# Patient Record
Sex: Male | Born: 1950 | Race: White | Hispanic: No | Marital: Married | Smoking: Former smoker
Health system: Southern US, Community
[De-identification: ages and names within clinical notes are randomized; demographics above are authoritative.]

## PROBLEM LIST (undated history)

## (undated) DIAGNOSIS — G4733 Obstructive sleep apnea (adult) (pediatric): Secondary | ICD-10-CM

## (undated) DIAGNOSIS — K219 Gastro-esophageal reflux disease without esophagitis: Secondary | ICD-10-CM

## (undated) DIAGNOSIS — M722 Plantar fascial fibromatosis: Secondary | ICD-10-CM

## (undated) DIAGNOSIS — I10 Essential (primary) hypertension: Secondary | ICD-10-CM

## (undated) DIAGNOSIS — G4730 Sleep apnea, unspecified: Secondary | ICD-10-CM

## (undated) DIAGNOSIS — K760 Fatty (change of) liver, not elsewhere classified: Secondary | ICD-10-CM

## (undated) DIAGNOSIS — I493 Ventricular premature depolarization: Secondary | ICD-10-CM

## (undated) DIAGNOSIS — N390 Urinary tract infection, site not specified: Secondary | ICD-10-CM

## (undated) DIAGNOSIS — K8020 Calculus of gallbladder without cholecystitis without obstruction: Secondary | ICD-10-CM

## (undated) DIAGNOSIS — D509 Iron deficiency anemia, unspecified: Secondary | ICD-10-CM

## (undated) DIAGNOSIS — I2510 Atherosclerotic heart disease of native coronary artery without angina pectoris: Secondary | ICD-10-CM

## (undated) DIAGNOSIS — I219 Acute myocardial infarction, unspecified: Secondary | ICD-10-CM

## (undated) DIAGNOSIS — E119 Type 2 diabetes mellitus without complications: Secondary | ICD-10-CM

## (undated) HISTORY — DX: Atherosclerotic heart disease of native coronary artery without angina pectoris: I25.10

## (undated) HISTORY — DX: Iron deficiency anemia, unspecified: D50.9

## (undated) HISTORY — DX: Ventricular premature depolarization: I49.3

## (undated) HISTORY — DX: Gastro-esophageal reflux disease without esophagitis: K21.9

## (undated) HISTORY — DX: Type 2 diabetes mellitus without complications: E11.9

## (undated) HISTORY — DX: Essential (primary) hypertension: I10

## (undated) HISTORY — DX: Fatty (change of) liver, not elsewhere classified: K76.0

## (undated) HISTORY — DX: Obstructive sleep apnea (adult) (pediatric): G47.33

## (undated) HISTORY — DX: Calculus of gallbladder without cholecystitis without obstruction: K80.20

## (undated) HISTORY — PX: SEPTOPLASTY: SUR1290

## (undated) HISTORY — PX: CATARACT EXTRACTION: SUR2

## (undated) HISTORY — PX: TONSILLECTOMY: SUR1361

## (undated) HISTORY — DX: Urinary tract infection, site not specified: N39.0

## (undated) HISTORY — DX: Plantar fascial fibromatosis: M72.2

## (undated) HISTORY — DX: Sleep apnea, unspecified: G47.30

---

## 2000-09-17 ENCOUNTER — Encounter: Payer: Self-pay | Admitting: Emergency Medicine

## 2000-09-17 ENCOUNTER — Inpatient Hospital Stay (HOSPITAL_COMMUNITY): Admission: EM | Admit: 2000-09-17 | Discharge: 2000-09-20 | Payer: Self-pay | Admitting: Emergency Medicine

## 2000-10-14 ENCOUNTER — Encounter (HOSPITAL_COMMUNITY): Admission: RE | Admit: 2000-10-14 | Discharge: 2001-01-12 | Payer: Self-pay | Admitting: Cardiology

## 2001-01-26 ENCOUNTER — Ambulatory Visit (HOSPITAL_BASED_OUTPATIENT_CLINIC_OR_DEPARTMENT_OTHER): Admission: RE | Admit: 2001-01-26 | Discharge: 2001-01-26 | Payer: Self-pay | Admitting: Cardiology

## 2006-06-23 ENCOUNTER — Ambulatory Visit: Payer: Self-pay | Admitting: Family Medicine

## 2006-08-01 ENCOUNTER — Observation Stay (HOSPITAL_COMMUNITY): Admission: RE | Admit: 2006-08-01 | Discharge: 2006-08-01 | Payer: Self-pay | Admitting: Otolaryngology

## 2006-09-15 ENCOUNTER — Ambulatory Visit: Payer: Self-pay | Admitting: Family Medicine

## 2006-09-15 LAB — CONVERTED CEMR LAB
ALT: 24 units/L (ref 0–40)
AST: 25 units/L (ref 0–37)
BUN: 17 mg/dL (ref 6–23)
CO2: 27 meq/L (ref 19–32)
Calcium: 9.5 mg/dL (ref 8.4–10.5)
Chloride: 104 meq/L (ref 96–112)
Chol/HDL Ratio, serum: 3
Cholesterol: 124 mg/dL (ref 0–200)
Creatinine, Ser: 1.1 mg/dL (ref 0.4–1.5)
Creatinine,U: 157.2 mg/dL
GFR calc non Af Amer: 73 mL/min
Glomerular Filtration Rate, Af Am: 89 mL/min/{1.73_m2}
Glucose, Bld: 138 mg/dL — ABNORMAL HIGH (ref 70–99)
HDL: 40.7 mg/dL (ref >39.0)
Hgb A1c MFr Bld: 7.9 % — ABNORMAL HIGH (ref 4.6–6.0)
LDL Cholesterol: 62 mg/dL (ref 0–99)
Microalb Creat Ratio: 2.5 mg/g (ref 0.0–30.0)
Microalb, Ur: 0.4 mg/dL (ref 0.0–1.9)
Potassium: 5 meq/L (ref 3.5–5.1)
Sodium: 138 meq/L (ref 135–145)
Triglyceride fasting, serum: 106 mg/dL (ref 0–149)
VLDL: 21 mg/dL (ref 0–40)

## 2006-10-17 ENCOUNTER — Ambulatory Visit: Payer: Self-pay | Admitting: Family Medicine

## 2006-10-17 LAB — CONVERTED CEMR LAB
ALT: 25 units/L (ref 0–40)
AST: 27 units/L (ref 0–37)
BUN: 19 mg/dL (ref 6–23)
CO2: 29 meq/L (ref 19–32)
Calcium: 9.7 mg/dL (ref 8.4–10.5)
Chloride: 108 meq/L (ref 96–112)
Creatinine, Ser: 1 mg/dL (ref 0.4–1.5)
GFR calc non Af Amer: 82 mL/min
Glomerular Filtration Rate, Af Am: 99 mL/min/{1.73_m2}
Glucose, Bld: 132 mg/dL — ABNORMAL HIGH (ref 70–99)
Potassium: 4.8 meq/L (ref 3.5–5.1)
Sodium: 140 meq/L (ref 135–145)

## 2006-12-15 ENCOUNTER — Ambulatory Visit: Payer: Self-pay | Admitting: Family Medicine

## 2006-12-15 LAB — CONVERTED CEMR LAB
ALT: 24 units/L (ref 0–40)
AST: 30 units/L (ref 0–37)
Amylase: 39 units/L (ref 27–131)
Hgb A1c MFr Bld: 7.7 % — ABNORMAL HIGH (ref 4.6–6.0)

## 2006-12-22 ENCOUNTER — Encounter: Admission: RE | Admit: 2006-12-22 | Discharge: 2007-03-22 | Payer: Self-pay | Admitting: Family Medicine

## 2007-02-26 DIAGNOSIS — E118 Type 2 diabetes mellitus with unspecified complications: Secondary | ICD-10-CM

## 2007-02-26 DIAGNOSIS — I10 Essential (primary) hypertension: Secondary | ICD-10-CM

## 2007-02-26 DIAGNOSIS — I2510 Atherosclerotic heart disease of native coronary artery without angina pectoris: Secondary | ICD-10-CM

## 2007-02-26 DIAGNOSIS — K219 Gastro-esophageal reflux disease without esophagitis: Secondary | ICD-10-CM

## 2007-02-26 DIAGNOSIS — G4730 Sleep apnea, unspecified: Secondary | ICD-10-CM | POA: Insufficient documentation

## 2007-02-26 DIAGNOSIS — E119 Type 2 diabetes mellitus without complications: Secondary | ICD-10-CM

## 2007-02-26 HISTORY — DX: Type 2 diabetes mellitus without complications: E11.9

## 2007-04-06 ENCOUNTER — Ambulatory Visit: Payer: Self-pay | Admitting: Family Medicine

## 2007-04-07 ENCOUNTER — Encounter (INDEPENDENT_AMBULATORY_CARE_PROVIDER_SITE_OTHER): Payer: Self-pay | Admitting: Family Medicine

## 2007-04-08 ENCOUNTER — Telehealth (INDEPENDENT_AMBULATORY_CARE_PROVIDER_SITE_OTHER): Payer: Self-pay | Admitting: *Deleted

## 2007-04-17 ENCOUNTER — Telehealth (INDEPENDENT_AMBULATORY_CARE_PROVIDER_SITE_OTHER): Payer: Self-pay | Admitting: *Deleted

## 2007-04-24 ENCOUNTER — Encounter (INDEPENDENT_AMBULATORY_CARE_PROVIDER_SITE_OTHER): Payer: Self-pay | Admitting: Family Medicine

## 2007-05-07 ENCOUNTER — Encounter (INDEPENDENT_AMBULATORY_CARE_PROVIDER_SITE_OTHER): Payer: Self-pay | Admitting: Family Medicine

## 2007-05-19 ENCOUNTER — Encounter (INDEPENDENT_AMBULATORY_CARE_PROVIDER_SITE_OTHER): Payer: Self-pay | Admitting: Family Medicine

## 2007-06-12 ENCOUNTER — Telehealth (INDEPENDENT_AMBULATORY_CARE_PROVIDER_SITE_OTHER): Payer: Self-pay | Admitting: *Deleted

## 2007-06-16 ENCOUNTER — Telehealth (INDEPENDENT_AMBULATORY_CARE_PROVIDER_SITE_OTHER): Payer: Self-pay | Admitting: *Deleted

## 2007-07-20 ENCOUNTER — Ambulatory Visit: Payer: Self-pay | Admitting: Family Medicine

## 2007-07-20 DIAGNOSIS — E785 Hyperlipidemia, unspecified: Secondary | ICD-10-CM | POA: Insufficient documentation

## 2007-07-21 LAB — CONVERTED CEMR LAB
CO2: 29 meq/L (ref 19–32)
Chloride: 108 meq/L (ref 96–112)
Creatinine, Ser: 0.9 mg/dL (ref 0.4–1.5)
Glucose, Bld: 161 mg/dL — ABNORMAL HIGH (ref 70–99)
Sodium: 144 meq/L (ref 135–145)

## 2007-07-22 ENCOUNTER — Telehealth (INDEPENDENT_AMBULATORY_CARE_PROVIDER_SITE_OTHER): Payer: Self-pay | Admitting: *Deleted

## 2007-08-21 ENCOUNTER — Telehealth (INDEPENDENT_AMBULATORY_CARE_PROVIDER_SITE_OTHER): Payer: Self-pay | Admitting: *Deleted

## 2007-08-26 ENCOUNTER — Encounter (INDEPENDENT_AMBULATORY_CARE_PROVIDER_SITE_OTHER): Payer: Self-pay | Admitting: Family Medicine

## 2007-09-18 ENCOUNTER — Telehealth (INDEPENDENT_AMBULATORY_CARE_PROVIDER_SITE_OTHER): Payer: Self-pay | Admitting: *Deleted

## 2007-10-16 ENCOUNTER — Telehealth (INDEPENDENT_AMBULATORY_CARE_PROVIDER_SITE_OTHER): Payer: Self-pay | Admitting: *Deleted

## 2007-11-16 ENCOUNTER — Telehealth (INDEPENDENT_AMBULATORY_CARE_PROVIDER_SITE_OTHER): Payer: Self-pay | Admitting: *Deleted

## 2007-11-17 ENCOUNTER — Encounter (INDEPENDENT_AMBULATORY_CARE_PROVIDER_SITE_OTHER): Payer: Self-pay | Admitting: Family Medicine

## 2007-11-20 ENCOUNTER — Ambulatory Visit: Payer: Self-pay | Admitting: Family Medicine

## 2007-11-20 ENCOUNTER — Encounter (INDEPENDENT_AMBULATORY_CARE_PROVIDER_SITE_OTHER): Payer: Self-pay | Admitting: *Deleted

## 2007-11-20 LAB — CONVERTED CEMR LAB
AST: 27 units/L (ref 0–37)
BUN: 19 mg/dL (ref 6–23)
Creatinine,U: 164.8 mg/dL
GFR calc Af Amer: 128 mL/min
Microalb, Ur: 0.4 mg/dL (ref 0.0–1.9)
Potassium: 3.8 meq/L (ref 3.5–5.1)
Sodium: 138 meq/L (ref 135–145)
Total CHOL/HDL Ratio: 2.6
Triglycerides: 68 mg/dL (ref 0–149)
VLDL: 14 mg/dL (ref 0–40)

## 2007-12-08 ENCOUNTER — Telehealth (INDEPENDENT_AMBULATORY_CARE_PROVIDER_SITE_OTHER): Payer: Self-pay | Admitting: Family Medicine

## 2008-01-04 ENCOUNTER — Encounter: Payer: Self-pay | Admitting: Internal Medicine

## 2008-01-13 ENCOUNTER — Telehealth (INDEPENDENT_AMBULATORY_CARE_PROVIDER_SITE_OTHER): Payer: Self-pay | Admitting: *Deleted

## 2008-03-25 ENCOUNTER — Ambulatory Visit: Payer: Self-pay | Admitting: Internal Medicine

## 2008-03-29 ENCOUNTER — Encounter (INDEPENDENT_AMBULATORY_CARE_PROVIDER_SITE_OTHER): Payer: Self-pay | Admitting: *Deleted

## 2008-03-29 LAB — CONVERTED CEMR LAB
Alkaline Phosphatase: 53 units/L (ref 39–117)
Bilirubin, Direct: 0.1 mg/dL (ref 0.0–0.3)
Total Bilirubin: 0.7 mg/dL (ref 0.3–1.2)
Total Protein: 6.4 g/dL (ref 6.0–8.3)

## 2008-05-10 ENCOUNTER — Telehealth (INDEPENDENT_AMBULATORY_CARE_PROVIDER_SITE_OTHER): Payer: Self-pay | Admitting: *Deleted

## 2008-06-16 ENCOUNTER — Telehealth (INDEPENDENT_AMBULATORY_CARE_PROVIDER_SITE_OTHER): Payer: Self-pay | Admitting: *Deleted

## 2008-06-24 ENCOUNTER — Ambulatory Visit: Payer: Self-pay | Admitting: Internal Medicine

## 2008-06-29 LAB — CONVERTED CEMR LAB
BUN: 18 mg/dL (ref 6–23)
Calcium: 9.4 mg/dL (ref 8.4–10.5)
Chloride: 105 meq/L (ref 96–112)
Creatinine, Ser: 0.9 mg/dL (ref 0.4–1.5)
GFR calc Af Amer: 111 mL/min
GFR calc non Af Amer: 92 mL/min
Hgb A1c MFr Bld: 6.6 % — ABNORMAL HIGH (ref 4.6–6.0)
LDL Cholesterol: 55 mg/dL (ref 0–99)
Total CHOL/HDL Ratio: 2.5
Triglycerides: 56 mg/dL (ref 0–149)
VLDL: 11 mg/dL (ref 0–40)

## 2008-07-20 ENCOUNTER — Telehealth (INDEPENDENT_AMBULATORY_CARE_PROVIDER_SITE_OTHER): Payer: Self-pay | Admitting: *Deleted

## 2008-08-25 ENCOUNTER — Ambulatory Visit: Payer: Self-pay | Admitting: Family Medicine

## 2008-08-25 LAB — CONVERTED CEMR LAB
Glucose, Urine, Semiquant: NEGATIVE
Nitrite: NEGATIVE
Urobilinogen, UA: NEGATIVE

## 2008-08-26 ENCOUNTER — Encounter: Payer: Self-pay | Admitting: Family Medicine

## 2008-08-29 ENCOUNTER — Encounter (INDEPENDENT_AMBULATORY_CARE_PROVIDER_SITE_OTHER): Payer: Self-pay | Admitting: *Deleted

## 2008-08-30 ENCOUNTER — Ambulatory Visit: Payer: Self-pay | Admitting: Family Medicine

## 2008-09-05 ENCOUNTER — Telehealth (INDEPENDENT_AMBULATORY_CARE_PROVIDER_SITE_OTHER): Payer: Self-pay | Admitting: *Deleted

## 2008-09-08 ENCOUNTER — Telehealth (INDEPENDENT_AMBULATORY_CARE_PROVIDER_SITE_OTHER): Payer: Self-pay | Admitting: *Deleted

## 2008-09-20 ENCOUNTER — Encounter (INDEPENDENT_AMBULATORY_CARE_PROVIDER_SITE_OTHER): Payer: Self-pay | Admitting: *Deleted

## 2008-10-05 ENCOUNTER — Encounter: Payer: Self-pay | Admitting: Family Medicine

## 2008-10-07 HISTORY — PX: CARDIAC CATHETERIZATION: SHX172

## 2008-11-08 ENCOUNTER — Ambulatory Visit: Payer: Self-pay | Admitting: Internal Medicine

## 2008-11-08 DIAGNOSIS — F528 Other sexual dysfunction not due to a substance or known physiological condition: Secondary | ICD-10-CM

## 2008-11-10 LAB — CONVERTED CEMR LAB
ALT: 24 units/L (ref 0–53)
Basophils Absolute: 0 10*3/uL (ref 0.0–0.1)
CO2: 27 meq/L (ref 19–32)
Calcium: 9.3 mg/dL (ref 8.4–10.5)
Cholesterol: 106 mg/dL (ref 0–200)
Creatinine,U: 263 mg/dL
GFR calc Af Amer: 98 mL/min
Glucose, Bld: 117 mg/dL — ABNORMAL HIGH (ref 70–99)
HDL: 40.1 mg/dL (ref >39.0)
Hemoglobin: 13.7 g/dL (ref 13.0–17.0)
LDL Cholesterol: 51 mg/dL (ref 0–99)
Lymphocytes Relative: 22.2 % (ref 12.0–46.0)
MCHC: 34.6 g/dL (ref 30.0–36.0)
Microalb Creat Ratio: 10.3 mg/g (ref 0.0–30.0)
Microalb, Ur: 2.7 mg/dL — ABNORMAL HIGH (ref 0.0–1.9)
Monocytes Relative: 8.7 % (ref 3.0–12.0)
Neutro Abs: 4.2 10*3/uL (ref 1.4–7.7)
Platelets: 219 10*3/uL (ref 150–400)
Potassium: 4.3 meq/L (ref 3.5–5.1)
RDW: 13.8 % (ref 11.5–14.6)
Sodium: 139 meq/L (ref 135–145)
Total CHOL/HDL Ratio: 2.6
Triglycerides: 74 mg/dL (ref 0–149)
VLDL: 15 mg/dL (ref 0–40)

## 2008-11-16 ENCOUNTER — Telehealth (INDEPENDENT_AMBULATORY_CARE_PROVIDER_SITE_OTHER): Payer: Self-pay | Admitting: *Deleted

## 2009-01-16 ENCOUNTER — Encounter: Payer: Self-pay | Admitting: Internal Medicine

## 2009-01-26 ENCOUNTER — Inpatient Hospital Stay (HOSPITAL_COMMUNITY): Admission: AD | Admit: 2009-01-26 | Discharge: 2009-01-28 | Payer: Self-pay | Admitting: Cardiology

## 2009-01-26 ENCOUNTER — Encounter: Payer: Self-pay | Admitting: Internal Medicine

## 2009-02-01 ENCOUNTER — Ambulatory Visit: Payer: Self-pay | Admitting: Internal Medicine

## 2009-02-01 DIAGNOSIS — R55 Syncope and collapse: Secondary | ICD-10-CM

## 2009-02-04 DIAGNOSIS — K760 Fatty (change of) liver, not elsewhere classified: Secondary | ICD-10-CM

## 2009-02-04 HISTORY — DX: Fatty (change of) liver, not elsewhere classified: K76.0

## 2009-02-07 ENCOUNTER — Ambulatory Visit: Payer: Self-pay | Admitting: Internal Medicine

## 2009-02-07 DIAGNOSIS — R1013 Epigastric pain: Secondary | ICD-10-CM

## 2009-02-07 DIAGNOSIS — K3189 Other diseases of stomach and duodenum: Secondary | ICD-10-CM | POA: Insufficient documentation

## 2009-02-09 ENCOUNTER — Telehealth (INDEPENDENT_AMBULATORY_CARE_PROVIDER_SITE_OTHER): Payer: Self-pay | Admitting: *Deleted

## 2009-02-10 ENCOUNTER — Encounter: Admission: RE | Admit: 2009-02-10 | Discharge: 2009-02-10 | Payer: Self-pay | Admitting: Internal Medicine

## 2009-02-13 ENCOUNTER — Telehealth: Payer: Self-pay | Admitting: Internal Medicine

## 2009-03-03 ENCOUNTER — Encounter: Payer: Self-pay | Admitting: Internal Medicine

## 2009-04-26 ENCOUNTER — Encounter: Payer: Self-pay | Admitting: Internal Medicine

## 2009-04-26 LAB — HM DIABETES EYE EXAM: HM Diabetic Eye Exam: NORMAL

## 2009-05-03 ENCOUNTER — Encounter: Payer: Self-pay | Admitting: Internal Medicine

## 2009-06-02 ENCOUNTER — Encounter: Payer: Self-pay | Admitting: Internal Medicine

## 2009-06-16 ENCOUNTER — Ambulatory Visit: Payer: Self-pay | Admitting: Internal Medicine

## 2009-06-23 LAB — CONVERTED CEMR LAB
CO2: 30 meq/L (ref 19–32)
Chloride: 106 meq/L (ref 96–112)
Glucose, Bld: 109 mg/dL — ABNORMAL HIGH (ref 70–99)
Hgb A1c MFr Bld: 6.4 % (ref 4.6–6.5)
Sodium: 140 meq/L (ref 135–145)

## 2009-10-20 ENCOUNTER — Ambulatory Visit: Payer: Self-pay | Admitting: Internal Medicine

## 2009-10-26 LAB — CONVERTED CEMR LAB
ALT: 21 units/L (ref 0–53)
AST: 32 units/L (ref 0–37)
Cholesterol: 109 mg/dL (ref 0–200)
HDL: 44.2 mg/dL (ref >39.00)
Hemoglobin: 12.5 g/dL — ABNORMAL LOW (ref 13.0–17.0)
Microalb Creat Ratio: 3.8 mg/g (ref 0.0–30.0)
Triglycerides: 61 mg/dL (ref 0.0–149.0)
VLDL: 12.2 mg/dL (ref 0.0–40.0)

## 2009-11-02 ENCOUNTER — Ambulatory Visit: Payer: Self-pay | Admitting: Internal Medicine

## 2009-11-06 LAB — CONVERTED CEMR LAB
Ferritin: 27.2 ng/mL (ref 22.0–322.0)
Folate: 11.4 ng/mL
Hemoglobin: 12.3 g/dL — ABNORMAL LOW (ref 13.0–17.0)
Vitamin B-12: 310 pg/mL (ref 211–911)

## 2010-03-19 ENCOUNTER — Ambulatory Visit: Payer: Self-pay | Admitting: Internal Medicine

## 2010-03-19 DIAGNOSIS — M25579 Pain in unspecified ankle and joints of unspecified foot: Secondary | ICD-10-CM

## 2010-03-19 LAB — HM DIABETES FOOT EXAM

## 2010-03-22 LAB — CONVERTED CEMR LAB
ALT: 25 units/L (ref 0–53)
AST: 35 units/L (ref 0–37)
Basophils Absolute: 0 10*3/uL (ref 0.0–0.1)
Basophils Relative: 0.5 % (ref 0.0–3.0)
CO2: 28 meq/L (ref 19–32)
Calcium: 9 mg/dL (ref 8.4–10.5)
Chloride: 106 meq/L (ref 96–112)
Creatinine, Ser: 0.9 mg/dL (ref 0.4–1.5)
Eosinophils Absolute: 0.2 10*3/uL (ref 0.0–0.7)
HCT: 36.5 % — ABNORMAL LOW (ref 39.0–52.0)
Hemoglobin: 12.6 g/dL — ABNORMAL LOW (ref 13.0–17.0)
Hgb A1c MFr Bld: 6.6 % — ABNORMAL HIGH (ref 4.6–6.5)
Lymphocytes Relative: 21.6 % (ref 12.0–46.0)
Lymphs Abs: 1.2 10*3/uL (ref 0.7–4.0)
MCHC: 34.6 g/dL (ref 30.0–36.0)
MCV: 98.6 fL (ref 78.0–100.0)
Monocytes Absolute: 0.3 10*3/uL (ref 0.1–1.0)
Neutro Abs: 3.8 10*3/uL (ref 1.4–7.7)
RBC: 3.7 M/uL — ABNORMAL LOW (ref 4.22–5.81)
RDW: 15.2 % — ABNORMAL HIGH (ref 11.5–14.6)
Sodium: 140 meq/L (ref 135–145)
Transferrin: 333.3 mg/dL (ref 212.0–360.0)

## 2010-04-25 ENCOUNTER — Encounter: Payer: Self-pay | Admitting: Internal Medicine

## 2010-05-22 ENCOUNTER — Encounter (INDEPENDENT_AMBULATORY_CARE_PROVIDER_SITE_OTHER): Payer: Self-pay | Admitting: *Deleted

## 2010-08-14 ENCOUNTER — Ambulatory Visit: Payer: Self-pay | Admitting: Internal Medicine

## 2010-08-22 ENCOUNTER — Ambulatory Visit: Payer: Self-pay | Admitting: Internal Medicine

## 2010-08-24 ENCOUNTER — Ambulatory Visit: Payer: Self-pay | Admitting: Internal Medicine

## 2010-08-24 DIAGNOSIS — J019 Acute sinusitis, unspecified: Secondary | ICD-10-CM | POA: Insufficient documentation

## 2010-08-27 LAB — CONVERTED CEMR LAB
HDL: 49.9 mg/dL (ref >39.00)
Hgb A1c MFr Bld: 6.6 % — ABNORMAL HIGH (ref 4.6–6.5)
Total CHOL/HDL Ratio: 2

## 2010-11-04 LAB — CONVERTED CEMR LAB
ALT: 26 units/L (ref 0–53)
AST: 28 units/L (ref 0–37)
BUN: 25 mg/dL — ABNORMAL HIGH (ref 6–23)
CO2: 29 meq/L (ref 19–32)
Calcium: 9.6 mg/dL (ref 8.4–10.5)
Chloride: 110 meq/L (ref 96–112)
Cholesterol: 123 mg/dL (ref 0–200)
Creatinine, Ser: 1 mg/dL (ref 0.4–1.5)
GFR calc Af Amer: 99 mL/min
GFR calc non Af Amer: 82 mL/min
Glucose, Bld: 105 mg/dL — ABNORMAL HIGH (ref 70–99)
HDL: 41.3 mg/dL (ref >39.0)
Hgb A1c MFr Bld: 6.6 % — ABNORMAL HIGH (ref 4.6–6.0)
LDL Cholesterol: 64 mg/dL (ref 0–99)
Potassium: 4.9 meq/L (ref 3.5–5.1)
Sodium: 141 meq/L (ref 135–145)
Total CHOL/HDL Ratio: 3
Triglycerides: 88 mg/dL (ref 0–149)
VLDL: 18 mg/dL (ref 0–40)

## 2010-11-06 NOTE — Letter (Signed)
Summary: eye exam, + cataract, no retinopathy  Blima Ledger OD   Imported By: Lanelle Bal 05/16/2010 11:28:11  _____________________________________________________________________  External Attachment:    Type:   Image     Comment:   External Document

## 2010-11-06 NOTE — Assessment & Plan Note (Signed)
Summary: 4 MONTH RETURN OFFICE VISIT///[facility]  Flu Vaccine Consent Questions     Do you have a history of severe allergic reactions to this vaccine? no    Any prior history of allergic reactions to egg and/or gelatin? no    Do you have a sensitivity to the preservative Thimersol? no    Do you have a past history of Guillan-Barre Syndrome? no    Do you currently have an acute febrile illness? no    Have you ever had a severe reaction to latex? no    Vaccine information given and explained to patient? yes    Are you currently pregnant? no    Lot Number:[id number]   Exp Date:04/05/2010   Site Given rightDeltoid IM Shary Decamp  October 20, 2009 9:08 AM    Vital Signs:  Patient profile:   60 year old male Height:      71 inches Weight:      261.8 pounds BMI:     36.65 Pulse rate:   74 / minute Pulse rhythm:   regular BP sitting:   118 / 80  (left arm) Cuff size:   large  Vitals Entered By: Shary Decamp (October 20, 2009 8:25 AM) CC: rov - fasting Is Patient Diabetic? Yes Comments  - pt wants to know if you will start rxing plavix (rx'd by turner) & pantoprazole (rx'd by GI).  He would like to get everything from [facility]  October 20, 2009 8:32 AM    History of Present Illness:  coronary artery disease -- see nurse's notes , no CP-SOB, feels well. Saw cards 12-10, told to RTC in 1 year  diabetes-- no ambulatory CBGs , does have a glucometer   GERD, see nurse's notes, symptoms well controlled    c/o mild heel pain, R side,  mostly w/ the first steps in AM , walking make it better    Current Medications (verified): 1)  Glipizide Xl 5 Mg  Tb24 (Glipizide) .... Take One Tablet Daily 2)  Lisinopril-Hydrochlorothiazide 20-12.5 Mg  Tabs (Lisinopril-Hydrochlorothiazide) .... Take One Tablet Daily 3)  Vytorin 10-10 Mg  Tabs (Ezetimibe-Simvastatin) .... Take One Tablet Daily 4)  Aspirin Ec 325 Mg  Tbec (Aspirin) 5)  Centrum Silver   Tabs (Multiple Vitamins-Minerals) 6)   Plavix 75 Mg Tabs (Clopidogrel Bisulfate) .... Take One Tablet By Mouth Daily 7)  Actoplus Met 15-850 Mg Tabs (Pioglitazone Hcl-Metformin Hcl) .... Take One Tab By Mouth Two Times A Day 8)  Pepcid Ac Maximum Strength 20 Mg Tabs (Famotidine) .Marland Kitchen.. 1 Daily 9)  Pantoprazole Sodium 40 Mg Tbec (Pantoprazole Sodium) .Marland Kitchen.. 1 By Mouth Once Daily  Allergies (verified): 1)  ! * No Ivp Dye Allergy 2)  ! * No Shellfish Allergy 3)  ! * No Latex Allergy  Past History:  Past Medical History: --s/p syncope 4-10 while in [place]: CTs ok, EEG neg, had a cath in [place]  w/  a 80% lesion (not stented), had CP/indigestion, was re-eval by Dr Mayford Knife , re-cath in [place] had 2 stents on  4-10;  was then refered to EP Dr Johney Frame  who Rx observation     --Coronary artery disease (Dr Mayford Knife), MI 2001 (stent x  2 ), 2 stents 4-10 --diabetes mellitus, type II --GERD --Hypertension --OSA-- can't tol. a CPAP --UTI in the 80s and 11-09 --Fatty liver per u/s 02-2009  Past Surgical History: Reviewed history from 02/07/2009 and no changes required. septoplasty with bilateral inferior turbinate reductions. tonsellectomy  Social  History: Reviewed history from 06/16/2009 and no changes required. Lives in [place].    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   H/o tobacco but quit 2001.   ETOH- drinks socially.   Drugs- denies. exercise-- better, walks 2-3  mile a day diet-- trying to be conciouss, s/p nutritionist   Review of Systems CV:  Denies palpitations and swelling of feet. Resp:  Denies cough, shortness of breath, and wheezing. GI:  Denies diarrhea, nausea, and vomiting. Endo:  no low sugar symptoms .  Physical Exam  General:  alert, well-developed, and overweight-appearing.   Lungs:  normal respiratory effort, no intercostal retractions, no accessory muscle use, and normal breath sounds.   Heart:  normal rate, regular rhythm, and no murmur.     Pulses:  normal pedal pulses bilaterally  Extremities:  no pretibial edema bilaterally  note tender to palpation on the right heel  Diabetes Management Exam:    Foot Exam (with socks and/or shoes not present):       Sensory-Pinprick/Light touch:          Left medial foot (L-4): normal          Left dorsal foot (L-5): normal          Left lateral foot (S-1): normal          Right medial foot (L-4): normal          Right dorsal foot (L-5): normal          Right lateral foot (S-1): normal       Sensory-Monofilament:          Left foot: normal          Right foot: normal       Inspection:          Left foot: normal          Right foot: normal       Nails:          Left foot: normal          Right foot: normal   Impression & Recommendations:  Problem # 1:  DYSLIPIDEMIA (ICD-272.4) due for labs, doing well, good compliance His updated medication list for this problem includes:    Vytorin 10-10 Mg Tabs (Ezetimibe-simvastatin) .Marland Kitchen... Take one tablet daily  Labs Reviewed: SGOT: 28 (11/08/2008)   SGPT: 24 (11/08/2008)   HDL:40.1 (11/08/2008), 43.6 (06/24/2008)  LDL:51 (11/08/2008), 55 (06/24/2008)  Chol:106 (11/08/2008), 110 (06/24/2008)  Trig:74 (11/08/2008), 56 (06/24/2008)  Orders: Venipuncture (41324) TLB-ALT (SGPT) (84460-ALT) TLB-AST (SGOT) (84450-SGOT) TLB-Lipid Panel (80061-LIPID)  Problem # 2:  HYPERTENSION (ICD-401.9) at goal  His updated medication list for this problem includes:    Lisinopril-hydrochlorothiazide 20-12.5 Mg Tabs (Lisinopril-hydrochlorothiazide) .Marland Kitchen... Take one tablet daily    BP today: 118/80 Prior BP: 118/80 (06/16/2009)  Labs Reviewed: K+: 4.3 (06/16/2009) Creat: : 1.0 (06/16/2009)   Chol: 106 (11/08/2008)   HDL: 40.1 (11/08/2008)   LDL: 51 (11/08/2008)   TG: 74 (11/08/2008)  Orders: TLB-Hemoglobin (Hgb) (85018-HGB)  Problem # 3:  GERD (ICD-530.81) RF meds  His updated medication list for this problem includes:    Pepcid Ac Maximum  Strength 20 Mg Tabs (Famotidine) .Marland Kitchen... 1 daily    Pantoprazole Sodium 40 Mg Tbec (Pantoprazole sodium) .Marland Kitchen... 1 by mouth once daily     Problem # 4:  DIABETES MELLITUS, TYPE II (ICD-250.00) gained weight over the holidays but already going back to a better diet and  losing weight labs His updated medication list for this problem includes:    Glipizide Xl 5 Mg Tb24 (Glipizide) .Marland Kitchen... Take one tablet daily    Lisinopril-hydrochlorothiazide 20-12.5 Mg Tabs (Lisinopril-hydrochlorothiazide) .Marland Kitchen... Take one tablet daily    Aspirin Ec 325 Mg Tbec (Aspirin)    Actoplus Met 15-850 Mg Tabs (Pioglitazone hcl-metformin hcl) .Marland Kitchen... Take one tab by mouth two times a day  Orders: TLB-A1C / Hgb A1C (Glycohemoglobin) (83036-A1C) TLB-Microalbumin/Creat Ratio, Urine (82043-MALB)  Problem # 5:  CORONARY ARTERY DISEASE (ICD-414.00) asymptomatic, will send labs to his  cardiologist His updated medication list for this problem includes:    Lisinopril-hydrochlorothiazide 20-12.5 Mg Tabs (Lisinopril-hydrochlorothiazide) .Marland Kitchen... Take one tablet daily    Aspirin Ec 325 Mg Tbec (Aspirin)    Plavix 75 Mg Tabs (Clopidogrel bisulfate) .Marland Kitchen... Take one tablet by mouth daily  Problem # 6:  heel pain  stretching twice a day, call if not better  Complete Medication List: 1)  Glipizide Xl 5 Mg Tb24 (Glipizide) .... Take one tablet daily 2)  Lisinopril-hydrochlorothiazide 20-12.5 Mg Tabs (Lisinopril-hydrochlorothiazide) .... Take one tablet daily 3)  Vytorin 10-10 Mg Tabs (Ezetimibe-simvastatin) .... Take one tablet daily 4)  Aspirin Ec 325 Mg Tbec (Aspirin) 5)  Centrum Silver Tabs (Multiple vitamins-minerals) 6)  Plavix 75 Mg Tabs (Clopidogrel bisulfate) .... Take one tablet by mouth daily 7)  Actoplus Met 15-850 Mg Tabs (Pioglitazone hcl-metformin hcl) .... Take one tab by mouth two times a day 8)  Pepcid Ac Maximum Strength 20 Mg Tabs (Famotidine) .Marland Kitchen.. 1 daily 9)  Pantoprazole Sodium 40 Mg Tbec (Pantoprazole sodium) .Marland Kitchen..  1 by mouth once daily  Other Orders: Admin 1st Vaccine (16109) Flu Vaccine 25yrs + (60454)  Patient Instructions: 1)  Please schedule a follow-up appointment in 4 months  ( complete physical) Prescriptions: PANTOPRAZOLE SODIUM 40 MG TBEC (PANTOPRAZOLE SODIUM) 1 by mouth once daily  #90 x 3   Entered by:   Shary Decamp   Authorized by:   Nolon Rod. Yordan Martindale MD   Signed by:   Shary Decamp on 10/20/2009   Method used:   Electronically to        [facility]* (mail-order)             ,          Ph: [phone]       Fax: [phone]   RxID:   [id number] PEPCID AC MAXIMUM STRENGTH 20 MG TABS (FAMOTIDINE) 1 daily  #90 x 3   Entered by:   Shary Decamp   Authorized by:   Nolon Rod. Cid Agena MD   Signed by:   Shary Decamp on 10/20/2009   Method used:   Electronically to        [facility]* (mail-order)             ,          Ph: [phone]       Fax: [phone]   RxID:   [id number] PLAVIX 75 MG TABS (CLOPIDOGREL BISULFATE) Take one tablet by mouth daily  #90 x 3   Entered by:   Shary Decamp   Authorized by:   Nolon Rod. Yesennia Hirota MD   Signed by:   Shary Decamp on 10/20/2009   Method used:   Electronically to        [facility]* (mail-order)             ,          Ph: [phone]  Fax: [phone]   RxID:   [id number]

## 2010-11-06 NOTE — Assessment & Plan Note (Signed)
Summary: COLD//[facility]   Vital Signs:  Patient profile:   60 year old male Weight:      259 pounds BMI:     36.25 Temp:     98.1 degrees F oral Pulse rate:   60 / minute Resp:     14 per minute BP sitting:   122 / 70  (left arm) Cuff size:   large  Vitals Entered By: Shonna Chock CMA (August 24, 2010 11:46 AM) CC: Cold symptoms since 08/15/2010, patient was seen at [facility] and told Viral and rx'ed cough syrup that is not helping much , URI symptoms   Primary Care Provider:  Drue Novel  CC:  Cold symptoms since 08/15/2010, patient was seen at [facility] and told Viral and rx'ed cough syrup that is not helping much , and URI symptoms.  History of Present Illness: RTI Symptoms      This is a 60 year old man who presents with RTI symptoms X 9 days, onset as ST.  The patient reports now  nasal congestion, green purulent nasal discharge, intermittent sore throat, and dry cough, but denies earache.  Associated symptoms include  slight wheezing( non smokr, no PMH of asthma).  The patient denies fever and dyspnea.  The patient also reports  frontal headache.  The patient denies the following risk factors for Strep sinusitis: unilateral facial pain, tooth pain, and tender adenopathy.  Rx:  cough syrup from [facility] 08/18/2010.  Current Medications (verified): 1)  Glipizide Xl 5 Mg  Tb24 (Glipizide) .... Take One Tablet Daily 2)  Lisinopril-Hydrochlorothiazide 20-12.5 Mg  Tabs (Lisinopril-Hydrochlorothiazide) .... Take One Tablet Daily 3)  Vytorin 10-10 Mg  Tabs (Ezetimibe-Simvastatin) .... Take One Tablet Daily 4)  Aspirin Ec 325 Mg  Tbec (Aspirin) 5)  Centrum Silver   Tabs (Multiple Vitamins-Minerals) 6)  Plavix 75 Mg Tabs (Clopidogrel Bisulfate) .... Take One Tablet By Mouth Daily 7)  Actoplus Met 15-850 Mg Tabs (Pioglitazone Hcl-Metformin Hcl) .... Take One Tab By Mouth Two Times A Day 8)  Pepcid Ac Maximum Strength 20 Mg Tabs (Famotidine) .Marland Kitchen.. 1 Daily 9)  Pantoprazole Sodium 40 Mg Tbec (Pantoprazole Sodium) .Marland Kitchen.. 1 By  Mouth Once Daily 10)  Promethazine-Codeine 6.25-10 Mg/67ml Syrp (Promethazine-Codeine) .Marland Kitchen.. 1 Teaspoon Every 4-6 Hours As Needed, Prescribed At [facility]  Allergies: 1)  ! * No Ivp Dye Allergy 2)  ! * No Shellfish Allergy 3)  ! * No Latex Allergy  Physical Exam  General:  well-nourished,in no acute distress; alert,appropriate and cooperative throughout examination Eyes:  No corneal or conjunctival inflammation noted. EOMI. Perrla.  Vision grossly normal. Ears:  External ear exam shows no significant lesions or deformities.  Otoscopic examination reveals clear canals, tympanic membranes are intact bilaterally without bulging, retraction, inflammation or discharge. Hearing is grossly normal bilaterally. Nose:  External nasal examination shows no deformity or inflammation. Nasal mucosa are pink and moist without lesions or exudates. Mouth:  Oral mucosa and oropharynx without lesions or exudates.  Teeth in good repair. Lungs:  Normal respiratory effort, chest expands symmetrically. Lungs are clear to auscultation, no crackles or wheezes. Dry cough  Heart:  Normal rate and regular rhythm. S1 and S2 normal without gallop, murmur, click, rub or other extra sounds. Cervical Nodes:  Shotty A Axillary Nodes:  No palpable lymphadenopathy   Impression & Recommendations:  Problem # 1:  SINUSITIS- ACUTE-NOS (ICD-461.9)  His updated medication list for this problem includes:    Promethazine-codeine 6.25-10 Mg/15ml Syrp (Promethazine-codeine) .Marland Kitchen... 1 teaspoon every 4-6 hours as needed,  prescribed at [facility]    Amoxicillin 500 Mg Caps (Amoxicillin) .Marland Kitchen... 1 three times a day  Complete Medication List: 1)  Glipizide Xl 5 Mg Tb24 (Glipizide) .... Take one tablet daily 2)  Lisinopril-hydrochlorothiazide 20-12.5 Mg Tabs (Lisinopril-hydrochlorothiazide) .... Take one tablet daily 3)  Vytorin 10-10 Mg Tabs (Ezetimibe-simvastatin) .... Take one tablet daily 4)  Aspirin Ec 325 Mg Tbec (Aspirin) 5)  Centrum Silver Tabs  (Multiple vitamins-minerals) 6)  Plavix 75 Mg Tabs (Clopidogrel bisulfate) .... Take one tablet by mouth daily 7)  Actoplus Met 15-850 Mg Tabs (Pioglitazone hcl-metformin hcl) .... Take one tab by mouth two times a day 8)  Pepcid Ac Maximum Strength 20 Mg Tabs (Famotidine) .Marland Kitchen.. 1 daily 9)  Pantoprazole Sodium 40 Mg Tbec (Pantoprazole sodium) .Marland Kitchen.. 1 by mouth once daily 10)  Promethazine-codeine 6.25-10 Mg/10ml Syrp (Promethazine-codeine) .Marland Kitchen.. 1 teaspoon every 4-6 hours as needed, prescribed at [facility] 11)  Amoxicillin 500 Mg Caps (Amoxicillin) .Marland Kitchen.. 1 three times a day 12)  Advair Diskus 100-50 Mcg/dose Aepb (Fluticasone-salmeterol) .Marland Kitchen.. 1 inhalation every 12 hrs ; gargle & spit after use  Patient Instructions: 1)  Neti pot once daily- 2X/ day  until sinuses are clear . 2)  Drink as much NON dairy  fluid as you can tolerate for the next few days.  Prescriptions: ADVAIR DISKUS 100-50 MCG/DOSE AEPB (FLUTICASONE-SALMETEROL) 1 inhalation every 12 hrs ; gargle & spit after use  #1 x 0   Entered and Authorized by:   Marga Melnick MD   Signed by:   Marga Melnick MD on 08/24/2010   Method used:   Samples Given   RxID:   [id number] AMOXICILLIN 500 MG CAPS (AMOXICILLIN) 1 three times a day  #30 x 0   Entered and Authorized by:   Marga Melnick MD   Signed by:   Marga Melnick MD on 08/24/2010   Method used:   Faxed to ...       [facility] [phone]* (retail)       [address]       [place], [place]  [address]       Ph: [phone]       Fax: [phone]   RxID:   [id number]    Orders Added: 1)  Est. Patient Level III [64332]

## 2010-11-06 NOTE — Assessment & Plan Note (Signed)
Summary: CPX/NS/KDC   Vital Signs:  Patient profile:   60 year old male Height:      71 inches Weight:      266 pounds Temp:     98.3 degrees F oral Pulse rate:   68 / minute Resp:     20 per minute BP sitting:   100 / 62  (left arm)  Vitals Entered By: Jeremy Johann CMA (March 19, 2010 2:26 PM) CC: cpx Comments --not fasting --pain in rt heel no better REVIEWED MED LIST, PATIENT AGREED DOSE AND INSTRUCTION CORRECT    History of Present Illness: CPX continue with right heel pain, mostly in the inner aspect. better with Aleve, worse with walking. No swelling  Allergies: 1)  ! * No Ivp Dye Allergy 2)  ! * No Shellfish Allergy 3)  ! * No Latex Allergy  Past History:  Past Medical History: Reviewed history from 10/20/2009 and no changes required. --s/p syncope 4-10 while in [place]: CTs ok, EEG neg, had a cath in [place]  w/  a 80% lesion (not stented), had CP/indigestion, was re-eval by Dr Mayford Knife , re-cath in [place] had 2 stents on  4-10;  was then refered to EP Dr Johney Frame  who Rx observation     --Coronary artery disease (Dr Mayford Knife), MI 2001 (stent x  2 ), 2 stents 4-10 --diabetes mellitus, type II --GERD --Hypertension --OSA-- can't tol. a CPAP --UTI in the 80s and 11-09 --Fatty liver per u/s 02-2009  Past Surgical History: Reviewed history from 02/07/2009 and no changes required. septoplasty with bilateral inferior turbinate reductions. tonsellectomy  Family History: Reviewed history from 02/07/2009 and no changes required. Daughter born with Tricuspid atresia.   Father had strokes and CAD. Mother died of emphysema. Unaware of any history of sudden death or arrhythmias. colon ca--no prostate ca--no  Social History: Lives in [place].    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   H/o tobacco but quit 2001.   ETOH- drinks socially.   Drugs- denies. exercise-- walks 2-3  mile a day diet-- trying to be  conciouss, avoiding sodas and junk food   Review of Systems CV:  Denies chest pain or discomfort, shortness of breath with exertion, and swelling of feet. Resp:  Denies cough and wheezing. GI:  Denies bloody stools, diarrhea, nausea, and vomiting. GU:  Denies dysuria, hematuria, urinary frequency, and urinary hesitancy. Psych:  Denies anxiety and depression.  Physical Exam  General:  alert, well-developed, and overweight-appearing.   Neck:  no masses and no thyromegaly.   Lungs:  normal respiratory effort, no intercostal retractions, no accessory muscle use, and normal breath sounds.   Heart:  normal rate, regular rhythm, no murmur, and no gallop.   Abdomen:  soft, non-tender, normal bowel sounds, no distention, no masses, and no guarding.   Rectal:  few external hemorrhoids noted. Normal sphincter tone. No rectal masses or tenderness. brown stools, Hemoccult negative Prostate:  Prostate gland firm and smooth, no enlargement, nodularity, tenderness, mass, asymmetry or induration. Extremities:  no edema Heels are normal to inspection bilaterally. Medial aspect of the right heel slightly tender without swelling  Diabetes Management Exam:    Foot Exam (with socks and/or shoes not present):       Sensory-Pinprick/Light touch:          Left medial foot (L-4): normal          Left dorsal foot (L-5): normal  Left lateral foot (S-1): normal          Right medial foot (L-4): normal          Right dorsal foot (L-5): normal          Right lateral foot (S-1): normal       Sensory-Monofilament:          Left foot: normal          Right foot: normal       Inspection:          Left foot: normal          Right foot: normal       Nails:          Left foot: normal          Right foot: normal   Impression & Recommendations:  Problem # 1:  PREVENTIVE HEALTH CARE (ICD-V70.0) Td 05 pneumonia 08 had Cscope 2003 negative per records reviewed. Hemoccult negative today; next Cscope  2013 Exercise discussed Diet-he is eating healthier yet unable to lose weight. Recommend Weight Watchers, calorie counting? a 1800-calorie diet provided had some anemia without iron deficiency, repeat labs  Orders: Venipuncture (16109) TLB-ALT (SGPT) (84460-ALT) TLB-AST (SGOT) (84450-SGOT) TLB-PSA (Prostate Specific Antigen) (84153-PSA) TLB-IBC Pnl (Iron/FE;Transferrin) (83550-IBC) TLB-CBC Platelet - w/Differential (85025-CBCD)  Problem # 2:  HYPERTENSION (ICD-401.9) BP slightly low today but asymptomatic His updated medication list for this problem includes:    Lisinopril-hydrochlorothiazide 20-12.5 Mg Tabs (Lisinopril-hydrochlorothiazide) .Marland Kitchen... Take one tablet daily  BP today: 100/62 Prior BP: 118/80 (10/20/2009)  Labs Reviewed: K+: 4.3 (06/16/2009) Creat: : 1.0 (06/16/2009)   Chol: 109 (10/20/2009)   HDL: 44.20 (10/20/2009)   LDL: 53 (10/20/2009)   TG: 61.0 (10/20/2009)  Orders: TLB-BMP (Basic Metabolic Panel-BMET) (80048-METABOL)  Problem # 3:  DIABETES MELLITUS, TYPE II (ICD-250.00) due for labs His updated medication list for this problem includes:    Glipizide Xl 5 Mg Tb24 (Glipizide) .Marland Kitchen... Take one tablet daily    Lisinopril-hydrochlorothiazide 20-12.5 Mg Tabs (Lisinopril-hydrochlorothiazide) .Marland Kitchen... Take one tablet daily    Aspirin Ec 325 Mg Tbec (Aspirin)    Actoplus Met 15-850 Mg Tabs (Pioglitazone hcl-metformin hcl) .Marland Kitchen... Take one tab by mouth two times a day  Labs Reviewed: Creat: 1.0 (06/16/2009)     Last Eye Exam: normal (04/26/2009) Reviewed HgBA1c results: 6.5 (10/20/2009)  6.4 (06/16/2009)  Orders: TLB-A1C / Hgb A1C (Glycohemoglobin) (83036-A1C)  Problem # 4:  HEEL PAIN (ICD-729.5)  referred to orthopedics   Orders: Orthopedic Referral (Ortho)  Complete Medication List: 1)  Glipizide Xl 5 Mg Tb24 (Glipizide) .... Take one tablet daily 2)  Lisinopril-hydrochlorothiazide 20-12.5 Mg Tabs (Lisinopril-hydrochlorothiazide) .... Take one tablet  daily 3)  Vytorin 10-10 Mg Tabs (Ezetimibe-simvastatin) .... Take one tablet daily 4)  Aspirin Ec 325 Mg Tbec (Aspirin) 5)  Centrum Silver Tabs (Multiple vitamins-minerals) 6)  Plavix 75 Mg Tabs (Clopidogrel bisulfate) .... Take one tablet by mouth daily 7)  Actoplus Met 15-850 Mg Tabs (Pioglitazone hcl-metformin hcl) .... Take one tab by mouth two times a day 8)  Pepcid Ac Maximum Strength 20 Mg Tabs (Famotidine) .Marland Kitchen.. 1 daily 9)  Pantoprazole Sodium 40 Mg Tbec (Pantoprazole sodium) .Marland Kitchen.. 1 by mouth once daily  Patient Instructions: 1)  Please schedule a follow-up appointment in 4 months .

## 2010-11-06 NOTE — Miscellaneous (Signed)
  Clinical Lists Changes  Observations: Added new observation of EYE EXAM BY: + cataract, no retinopathy (05/22/2010 8:23)

## 2010-11-06 NOTE — Assessment & Plan Note (Signed)
Summary: 4 month followup//kn   Vital Signs:  Patient profile:   60 year old male Height:      71 inches Weight:      268 pounds BMI:     37.51 Pulse rate:   88 / minute Pulse rhythm:   regular BP sitting:   136 / 78  (left arm) Cuff size:   large  Vitals Entered By: Army Fossa CMA (August 14, 2010 3:35 PM) CC: 4 month flu Comments dicsuss generics for plavix, actoplus, and glipizide medco   History of Present Illness: routine office visit Doing well Had heel pain, saw a podiatrist, had an Achilles tendon problem, status post a cast. He is  better. we review all his medicines, generics? There is no good alternative for him, patient is satisfied with his current therapy. No change  Current Medications (verified): 1)  Glipizide Xl 5 Mg  Tb24 (Glipizide) .... Take One Tablet Daily 2)  Lisinopril-Hydrochlorothiazide 20-12.5 Mg  Tabs (Lisinopril-Hydrochlorothiazide) .... Take One Tablet Daily 3)  Vytorin 10-10 Mg  Tabs (Ezetimibe-Simvastatin) .... Take One Tablet Daily 4)  Aspirin Ec 325 Mg  Tbec (Aspirin) 5)  Centrum Silver   Tabs (Multiple Vitamins-Minerals) 6)  Plavix 75 Mg Tabs (Clopidogrel Bisulfate) .... Take One Tablet By Mouth Daily 7)  Actoplus Met 15-850 Mg Tabs (Pioglitazone Hcl-Metformin Hcl) .... Take One Tab By Mouth Two Times A Day 8)  Pepcid Ac Maximum Strength 20 Mg Tabs (Famotidine) .Marland Kitchen.. 1 Daily 9)  Pantoprazole Sodium 40 Mg Tbec (Pantoprazole Sodium) .Marland Kitchen.. 1 By Mouth Once Daily  Allergies (verified): 1)  ! * No Ivp Dye Allergy 2)  ! * No Shellfish Allergy 3)  ! * No Latex Allergy  Past History:  Past Medical History: --s/p syncope 4-10 while in [place]: CTs ok, EEG neg, had a cath in [place]  w/  a 80% lesion (not stented), had CP/indigestion, was re-eval by Dr Mayford Knife , re-cath in [place] had 2 stents on  4-10;  was then refered to EP Dr Johney Frame  who Rx observation     --Coronary artery disease (Dr Mayford Knife), MI 2001 (stent x  2 ), 2 stents  4-10 --diabetes mellitus, type II --GERD --Hypertension --OSA-- can't tol. a CPAP --UTI in the 80s and 11-09 --Fatty liver per u/s 02-2009  Past Surgical History: Reviewed history from 02/07/2009 and no changes required. septoplasty with bilateral inferior turbinate reductions. tonsellectomy  Social History: Reviewed history from 03/19/2010 and no changes required. Lives in [place].    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   H/o tobacco but quit 2001.   ETOH- drinks socially.   Drugs- denies. exercise-- walks 2-3  mile a day diet-- trying to be conciouss, avoiding sodas and junk food   Review of Systems CV:  Denies chest pain or discomfort and swelling of feet. Resp:  Denies cough and shortness of breath. Endo:  no ambulatory CBGs  no low CBGs symptoms .  Physical Exam  General:  alert and well-developed.   Lungs:  normal respiratory effort, no intercostal retractions, no accessory muscle use, and normal breath sounds.   Heart:  normal rate, regular rhythm, no murmur, and no gallop.   Extremities:  no edema   Psych:  not anxious appearing and not depressed appearing.     Impression & Recommendations:  Problem # 1:  HEEL PAIN (ICD-729.5) resolved  Problem # 2:  DYSLIPIDEMIA (ICD-272.4) RF, labs  His updated  medication list for this problem includes:    Vytorin 10-10 Mg Tabs (Ezetimibe-simvastatin) .Marland Kitchen... Take one tablet daily  Labs Reviewed: SGOT: 35 (03/19/2010)   SGPT: 25 (03/19/2010)   HDL:44.20 (10/20/2009), 40.1 (11/08/2008)  LDL:53 (10/20/2009), 51 (11/08/2008)  Chol:109 (10/20/2009), 106 (11/08/2008)  Trig:61.0 (10/20/2009), 74 (11/08/2008)  Problem # 3:  HYPERTENSION (ICD-401.9) at goal  His updated medication list for this problem includes:    Lisinopril-hydrochlorothiazide 20-12.5 Mg Tabs (Lisinopril-hydrochlorothiazide) .Marland Kitchen... Take one tablet daily  BP today: 136/78 Prior BP: 100/62  (03/19/2010)  Labs Reviewed: K+: 4.6 (03/19/2010) Creat: : 0.9 (03/19/2010)   Chol: 109 (10/20/2009)   HDL: 44.20 (10/20/2009)   LDL: 53 (10/20/2009)   TG: 61.0 (10/20/2009)  Problem # 4:  DIABETES MELLITUS, TYPE II (ICD-250.00) labs  His updated medication list for this problem includes:    Glipizide Xl 5 Mg Tb24 (Glipizide) .Marland Kitchen... Take one tablet daily    Lisinopril-hydrochlorothiazide 20-12.5 Mg Tabs (Lisinopril-hydrochlorothiazide) .Marland Kitchen... Take one tablet daily    Aspirin Ec 325 Mg Tbec (Aspirin)    Actoplus Met 15-850 Mg Tabs (Pioglitazone hcl-metformin hcl) .Marland Kitchen... Take one tab by mouth two times a day  Labs Reviewed: Creat: 0.9 (03/19/2010)     Last Eye Exam: normal (04/26/2009) Reviewed HgBA1c results: 6.6 (03/19/2010)  6.5 (10/20/2009)  Complete Medication List: 1)  Glipizide Xl 5 Mg Tb24 (Glipizide) .... Take one tablet daily 2)  Lisinopril-hydrochlorothiazide 20-12.5 Mg Tabs (Lisinopril-hydrochlorothiazide) .... Take one tablet daily 3)  Vytorin 10-10 Mg Tabs (Ezetimibe-simvastatin) .... Take one tablet daily 4)  Aspirin Ec 325 Mg Tbec (Aspirin) 5)  Centrum Silver Tabs (Multiple vitamins-minerals) 6)  Plavix 75 Mg Tabs (Clopidogrel bisulfate) .... Take one tablet by mouth daily 7)  Actoplus Met 15-850 Mg Tabs (Pioglitazone hcl-metformin hcl) .... Take one tab by mouth two times a day 8)  Pepcid Ac Maximum Strength 20 Mg Tabs (Famotidine) .Marland Kitchen.. 1 daily 9)  Pantoprazole Sodium 40 Mg Tbec (Pantoprazole sodium) .Marland Kitchen.. 1 by mouth once daily  Other Orders: Admin 1st Vaccine (60454) Flu Vaccine 65yrs + (09811)  Patient Instructions: 1)  came back fasting 2)  A1C-- dx DM 3)  FLP--dx high cholesterol  4)  Please schedule a follow-up appointment in 4 to 5 months .  Prescriptions: VYTORIN 10-10 MG  TABS (EZETIMIBE-SIMVASTATIN) Take one tablet daily  #30 x 0   Entered by:   Army Fossa CMA   Authorized by:   Nolon Rod. Paz MD   Signed by:   Army Fossa CMA on 08/14/2010    Method used:   Electronically to        [facility] [phone]* (retail)       [address]       [place], [place]  [address]       Ph: [phone]       Fax: [phone]   RxID:   [id number] ACTOPLUS MET 15-850 MG TABS (PIOGLITAZONE HCL-METFORMIN HCL) take one tab by mouth two times a day  #180 x 3   Entered by:   Army Fossa CMA   Authorized by:   Nolon Rod. Paz MD   Signed by:   Army Fossa CMA on 08/14/2010   Method used:   Electronically to        [facility]* (retail)             ,          Ph: [phone]  Fax: [phone]   RxID:   [id number] PLAVIX 75 MG TABS (CLOPIDOGREL BISULFATE) Take one tablet by mouth daily  #90 x 3   Entered by:   Army Fossa CMA   Authorized by:   Nolon Rod. Paz MD   Signed by:   Army Fossa CMA on 08/14/2010   Method used:   Electronically to        [facility]* (retail)             ,          Ph: [phone]       Fax: [phone]   RxID:   [id number] GLIPIZIDE XL 5 MG  TB24 (GLIPIZIDE) Take one tablet daily  #90 x 3   Entered by:   Army Fossa CMA   Authorized by:   Nolon Rod. Paz MD   Signed by:   Army Fossa CMA on 08/14/2010   Method used:   Electronically to        [facility]* (retail)             ,          Ph: [phone]       Fax: [phone]   RxID:   [id number] PANTOPRAZOLE SODIUM 40 MG TBEC (PANTOPRAZOLE SODIUM) 1 by mouth once daily  #90 x 3   Entered by:   Army Fossa CMA   Authorized by:   Nolon Rod. Paz MD   Signed by:   Army Fossa CMA on 08/14/2010   Method used:   Electronically to        [facility]* (retail)             ,          Ph: [phone]       Fax: [phone]   RxID:   [id number] PEPCID AC MAXIMUM STRENGTH 20 MG TABS (FAMOTIDINE) 1 daily  #90 x 3   Entered by:   Army Fossa CMA   Authorized by:   Nolon Rod. Paz MD   Signed by:   Army Fossa CMA on 08/14/2010   Method used:   Electronically to         [facility]* (retail)             ,          Ph: [phone]       Fax: [phone]   RxID:   [id number] VYTORIN 10-10 MG  TABS (EZETIMIBE-SIMVASTATIN) Take one tablet daily  #90 x 3   Entered by:   Army Fossa CMA   Authorized by:   Nolon Rod. Paz MD   Signed by:   Army Fossa CMA on 08/14/2010   Method used:   Electronically to        [facility]* (retail)             ,          Ph: [phone]       Fax: [phone]   RxID:   [id number] LISINOPRIL-HYDROCHLOROTHIAZIDE 20-12.5 MG  TABS (LISINOPRIL-HYDROCHLOROTHIAZIDE) Take one tablet daily  #90 x 3   Entered by:   Army Fossa CMA   Authorized by:   Nolon Rod. Paz MD   Signed by:   Army Fossa CMA on 08/14/2010   Method used:   Electronically to        [facility]* (retail)             ,  Ph: [phone]       Fax: [phone]   RxID:   [id number]  Flu Vaccine Consent Questions     Do you have a history of severe allergic reactions to this vaccine? no    Any prior history of allergic reactions to egg and/or gelatin? no    Do you have a sensitivity to the preservative Thimersol? no    Do you have a past history of Guillan-Barre Syndrome? no    Do you currently have an acute febrile illness? no    Have you ever had a severe reaction to latex? no    Vaccine information given and explained to patient? yes    Are you currently pregnant? no    Lot Number:[id number]   Exp Date:04/06/2011   Site Given  Right Deltoid IM  Orders Added: 1)  Admin 1st Vaccine [90471] 2)  Flu Vaccine 18yrs + [25956] 3)  Est. Patient Level III [38756]    .lbflu1

## 2010-11-28 ENCOUNTER — Encounter: Payer: Self-pay | Admitting: Internal Medicine

## 2010-12-13 ENCOUNTER — Encounter (INDEPENDENT_AMBULATORY_CARE_PROVIDER_SITE_OTHER): Payer: Self-pay | Admitting: *Deleted

## 2010-12-18 NOTE — Letter (Signed)
Summary: Primary Care Appointment Letter  [facility] [place]  [address] [place], [place] [address]   Phone: [phone]  Fax: [phone]    12/13/2010 MRN: [id number]  [facility] [address] [place], [place]  [address]  Dear Mr. Usery,   Your Primary Care Physician Marga Melnick MD has indicated that:    _______it is time to schedule an appointment.    _______you missed your appointment on______ and need to call and          reschedule.    _______you need to have lab work done.    _______you need to schedule an appointment discuss lab or test results.      Arlester Marker     You need to call to reschedule your appointment that is                       scheduled for Tuesday,  January 15, 2011.  This appointment was for a 5 month followup at 8:30 AM.  Dr Drue Novel will not be in the office that day.     Please call our office as soon as possible. Our phone number is [phone]. Our office is open 8a-12noon and 1p-5p, Monday through Friday.     Thank you,    [facility]

## 2011-01-15 ENCOUNTER — Ambulatory Visit: Payer: Self-pay | Admitting: Internal Medicine

## 2011-01-16 LAB — LIPID PANEL
HDL: 43 mg/dL (ref >39)
Triglycerides: 54 mg/dL (ref <150)
VLDL: 11 mg/dL (ref 0–40)

## 2011-01-16 LAB — CBC
HCT: 38.1 % — ABNORMAL LOW (ref 39.0–52.0)
Hemoglobin: 13.1 g/dL (ref 13.0–17.0)
MCHC: 34.3 g/dL (ref 30.0–36.0)
MCHC: 35.3 g/dL (ref 30.0–36.0)
MCV: 98 fL (ref 78.0–100.0)
Platelets: 208 10*3/uL (ref 150–400)
Platelets: 210 10*3/uL (ref 150–400)
RBC: 3.68 MIL/uL — ABNORMAL LOW (ref 4.22–5.81)
RBC: 3.9 MIL/uL — ABNORMAL LOW (ref 4.22–5.81)
RDW: 14 % (ref 11.5–15.5)
WBC: 5.5 10*3/uL (ref 4.0–10.5)

## 2011-01-16 LAB — COMPREHENSIVE METABOLIC PANEL
ALT: 22 U/L (ref 0–53)
AST: 22 U/L (ref 0–37)
Albumin: 3.9 g/dL (ref 3.5–5.2)
Alkaline Phosphatase: 76 U/L (ref 39–117)
Chloride: 104 mEq/L (ref 96–112)
GFR calc Af Amer: >60 mL/min (ref >60)
Potassium: 4 mEq/L (ref 3.5–5.1)
Sodium: 139 mEq/L (ref 135–145)
Total Protein: 6.4 g/dL (ref 6.0–8.3)

## 2011-01-16 LAB — GLUCOSE, CAPILLARY
Glucose-Capillary: 129 mg/dL — ABNORMAL HIGH (ref 70–99)
Glucose-Capillary: 133 mg/dL — ABNORMAL HIGH (ref 70–99)
Glucose-Capillary: 136 mg/dL — ABNORMAL HIGH (ref 70–99)
Glucose-Capillary: 142 mg/dL — ABNORMAL HIGH (ref 70–99)
Glucose-Capillary: 97 mg/dL (ref 70–99)

## 2011-01-16 LAB — BASIC METABOLIC PANEL
BUN: 9 mg/dL (ref 6–23)
Creatinine, Ser: 1.08 mg/dL (ref 0.4–1.5)
GFR calc non Af Amer: >60 mL/min (ref >60)

## 2011-01-16 LAB — DIFFERENTIAL
Basophils Relative: 0 % (ref 0–1)
Eosinophils Absolute: 0.1 10*3/uL (ref 0.0–0.7)
Eosinophils Relative: 2 % (ref 0–5)
Monocytes Absolute: 0.6 10*3/uL (ref 0.1–1.0)
Monocytes Relative: 9 % (ref 3–12)
Neutro Abs: 4 10*3/uL (ref 1.7–7.7)

## 2011-01-16 LAB — CK TOTAL AND CKMB (NOT AT ARMC)
CK, MB: 2.2 ng/mL (ref 0.3–4.0)
CK, MB: 2.2 ng/mL (ref 0.3–4.0)
Relative Index: 2.1 (ref 0.0–2.5)
Relative Index: INVALID (ref 0.0–2.5)
Total CK: 79 U/L (ref 7–232)

## 2011-01-16 LAB — BRAIN NATRIURETIC PEPTIDE: Pro B Natriuretic peptide (BNP): <30 pg/mL (ref 0.0–100.0)

## 2011-01-16 LAB — TSH: TSH: 0.901 u[IU]/mL (ref 0.350–4.500)

## 2011-01-16 LAB — PROTIME-INR: INR: 1.1 (ref 0.00–1.49)

## 2011-01-16 LAB — HEPARIN LEVEL (UNFRACTIONATED)
Heparin Unfractionated: 0.58 IU/mL (ref 0.30–0.70)
Heparin Unfractionated: 0.66 IU/mL (ref 0.30–0.70)

## 2011-01-17 ENCOUNTER — Encounter: Payer: Self-pay | Admitting: Internal Medicine

## 2011-01-17 ENCOUNTER — Ambulatory Visit (INDEPENDENT_AMBULATORY_CARE_PROVIDER_SITE_OTHER): Payer: BLUE CROSS/BLUE SHIELD | Admitting: Internal Medicine

## 2011-01-17 DIAGNOSIS — E785 Hyperlipidemia, unspecified: Secondary | ICD-10-CM

## 2011-01-17 DIAGNOSIS — E119 Type 2 diabetes mellitus without complications: Secondary | ICD-10-CM

## 2011-01-17 DIAGNOSIS — I2510 Atherosclerotic heart disease of native coronary artery without angina pectoris: Secondary | ICD-10-CM

## 2011-01-17 DIAGNOSIS — I10 Essential (primary) hypertension: Secondary | ICD-10-CM

## 2011-01-17 LAB — BASIC METABOLIC PANEL
CO2: 27 mEq/L (ref 19–32)
Calcium: 9.2 mg/dL (ref 8.4–10.5)
Creatinine, Ser: 1 mg/dL (ref 0.4–1.5)

## 2011-01-17 LAB — HEMOGLOBIN A1C: Hgb A1c MFr Bld: 6.7 % — ABNORMAL HIGH (ref 4.6–6.5)

## 2011-01-17 NOTE — Assessment & Plan Note (Signed)
Well controlled Labs

## 2011-01-17 NOTE — Assessment & Plan Note (Signed)
Doing well, well controlled per last FLP

## 2011-01-17 NOTE — Assessment & Plan Note (Signed)
Doing great, exercising more  Counseled about diet

## 2011-01-17 NOTE — Assessment & Plan Note (Addendum)
Asx, controlling CV RF Pt will call cardiology himself , he likes to be checked every one or 2 years by his cardiologist

## 2011-01-17 NOTE — Progress Notes (Signed)
  Subjective:    Patient ID: Daniel Cruz, male    DOB: 06/20/1951, 60 y.o.   MRN: [id number]  HPI  H/o tendinitis, resolved, able to walk more DM--  good medication compliance . No amb CBGs HTN--  No amb BPs CAD-- asx ,  chart reviewed, has not seen cards in > 1 year    Past Medical History  Diagnosis Date  . Syncope 01/2009    while in [place]; CT's ok, EEG neg, had a cath in [place] w/ 80% lesion (not stented), had CP/indgestion, was re-eval by Dr.Turner, re-cath in [place] had 2 stents on 4/10, was then refered to EP Dr.Allred   . CAD (coronary artery disease)     dr Mayford Knife, MI 2001 (stents x 2),   . OSA (obstructive sleep apnea)     can't tol. a CPAP  . UTI (lower urinary tract infection)     in the 80s and 11/09  . Fatty liver 02/2009    per u/s  . DIABETES MELLITUS, TYPE II 02/26/2007   Past Surgical History  Procedure Date  . Septoplasty     w/ bilateral inferior turbinate reductions  . Tonsillectomy      Review of Systems  Constitutional:       No LE paresthesias, vision normal  Cardiovascular: Negative for chest pain, palpitations and leg swelling.  Gastrointestinal: Negative for nausea, diarrhea and blood in stool.       GERD sx well controlled w/ pepcid -protonix        Objective:   Physical Exam  Constitutional: He is oriented to person, place, and time. He appears well-developed and well-nourished.  Cardiovascular: Normal rate, regular rhythm and normal heart sounds.   No murmur heard. Pulmonary/Chest: Effort normal and breath sounds normal. No respiratory distress. He has no wheezes. He has no rales.  Musculoskeletal: He exhibits no edema.  Neurological: He is alert and oriented to person, place, and time.  Psychiatric: He has a normal mood and affect. His behavior is normal. Judgment and thought content normal.          Assessment & Plan:  Patient got sunburned 4 weeks ago, had a very red lesion at the right arm, lesion looks better to  him at this point. Exam show a slightly elevated less than 1 cm, round, red non-scaly lesion. It is located slightly distal from the right elbow. Recommend to keep an eye on the area, if no better, needs to be seen by his dermatologist in ~ weeks for possible biopsy

## 2011-01-18 ENCOUNTER — Telehealth: Payer: Self-pay | Admitting: *Deleted

## 2011-01-18 NOTE — Telephone Encounter (Signed)
Message left for patient to return my call.  

## 2011-01-18 NOTE — Telephone Encounter (Signed)
Message copied by Army Fossa on Fri Jan 18, 2011 10:32 AM ------      Message from: Willow Ora      Created: Thu Jan 17, 2011  6:17 PM       Advised patient      Diabetes is stable, hemoglobin A1c 6.7.      Potassium normal. Good results

## 2011-01-18 NOTE — Telephone Encounter (Signed)
I spoke w/ pt he is aware.  

## 2011-02-22 NOTE — Op Note (Signed)
NAMEQUINLIN, CONANT            ACCOUNT NO.:  [id number]   MEDICAL RECORD NO.:  [id number]          PATIENT TYPE:  AMB   LOCATION:  SDS                          FACILITY:  [facility]   PHYSICIAN:  Kristine Garbe. 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   OPERATION:  Septoplasty with bilateral inferior turbinate reductions.   SURGEON:  Narda Bonds, M.D.   ANESTHESIA:  General endotracheal.   COMPLICATIONS:  None.   BRIEF CLINICAL NOTE:  Daniel Cruz is a 60 year old gentleman with  severe obstructive sleep apnea who has been using nasal CPAP.  He has  chronic right-sided nasal obstruction on exam and has a severe deviation of  the septum to the right with a very narrowed right nasal passageway compared  to left.  He has compensatory turbinate hypertrophy.  Otherwise, there are  no polyps or no obstructing lesions in the nose.  He had previous  tonsillectomy.  I briefly discussed the possibility of palatoplasty at the  same time as a septoplasty and turbinate reductions, but he would like to  try the nasal surgery first without the palatoplasty.  He is taken to the  operating room at this time for septoplasty and turbinate and to help  improve the nasal airway and improve his breathing.   DESCRIPTION OF PROCEDURE:  After adequate endotracheal anesthesia, nose was  prepped with Betadine solution and was draped out with sterile towels.  The  nose then further prepped with cotton pledgets soaked in Afrin for  decongestant, and the septum and turbinates were injected with Xylocaine  with epinephrine.  A hemitransfixion incision was made along the septum on  the right side.  Because of severe  deformity of the cartilage to the right,  mucoperichondrial flaps were elevated on either side of the caudal edge of  the septum.  The severely deviated portion of the septum was removed.  This  allowed the septal to return much more toward midline.  In addition, the  patient large spur posteriorly from the bony septum in the back, and this  was removed.  The inferior maxillary crest was fractured more to the left  side.  This completed the septoplasty portion of the procedure.  Next,  inferior turbinate reductions were performed.  On left side, because of the  size of left inferior turbinate, submucosal resection of the turbinate bone  was performed.  Then, submucosal cauterization, and the remaining turbinate  was outfractured.  On the right side, bipolar submucosal cauterization was  performed, and the turbinate was outfractured.  This completed the  procedure.  After obtaining adequate hemostasis, the hemitransfixion  incision was closed with interrupted 4-0 chromic suture, septum __________  with a 4-0 chromic suture and splints were secured to either side of the  septum with a 4-0 nylon suture.  The nose was packed with Telfa soaked in  bacitracin ointment bilaterally.  The patient was  awoken from anesthesia and transferred to recovery room postoperative doing  well.  Because of his history of sleep apnea, he will be observed overnight  in the hospital and planned on removing the nasal packing and discharging  home in the morning.  Medications will include Vicodin p.r.n. pain and  Keflex 500 mg b.i.d. for 5 days.           ______________________________  Kristine Garbe Ezzard Standing, M.D.     CEN/MEDQ  D:  08/01/2006  T:  08/02/2006  Job:  [id number]   cc:   Leanne Chang, M.D.

## 2011-02-22 NOTE — Cardiovascular Report (Signed)
[place]  Patient:    Daniel Cruz, Daniel Cruz                     MRN: [id number] Proc. Date: 09/17/00 Adm. Date:  [id number] Attending:  Armanda Magic CC:         Redmond Baseman, M.D.  Armanda Magic, M.D.  Cardiac Catheterization Lab   Cardiac Catheterization  PROCEDURES PERFORMED: 1. Percutaneous transluminal coronary angioplasty and stent, mid right    coronary artery. 2. Percutaneous transluminal coronary angioplasty and stent, distal right    coronary artery.  INDICATIONS:  A 60 year old man presented to [facility] Room 45 minutes into an acute inferior wall myocardial infarction.  Coronary angiography were completed by Dr. Mayford Knife revealing complete occlusion of the right coronary artery.  No significant left coronary artery disease is noted.  The patient is to undergo direct angioplasty for definitive treatment at this time.  PROCEDURAL NOTE:  The previously placed 6-French catheter sheath was exchanged over a long guiding J wire for a 7-French catheter sheath.  A 7-French Scimed Wiseguide AR1 guiding catheter was advanced to the ascending aorta where the right coronary os was engaged.  A 0.014-inch Scimed luge intracoronary guide wire was passed across the complete occlusion in the right coronary without difficulty.  Initial balloon dilatation was performed with a 3.0- x 20-mm Scimed Maverick inflated to 6-8 atmospheres.  This was removed, and a 3.5- x 23-mm ACS Guidant Penta intracoronary stent was placed across the lesion.  It was ultimately deployed at 14 atmospheres for 1.5 minutes.  This established excellent distal flow and revealed a long 80% stenosis in the distal vessel just prior to the crux.  The 3.0- x 20-mm Scimed Maverick was returned to the circulation and placed across this lesion in the distal portion of the right coronary.  It was inflated to 6 atmospheres for approximately 2 minutes.  It was then removed, and a  3.5- x 18-mm Scimed NIR Elite intracoronary stent was placed across the lesion.  This device was ultimately deployed at 14 atmospheres for approximately 90 seconds.  This resulted in wide patency of the right coronary.  There was slightly diminished flow, TIMI grade II flow.  The patient received 200 mcg of intracoronary Verapamil in 100 mcg aliquots.  This improved the TIMI grade flow to III at completion.  FINAL IMPRESSION: 1. Successful PTCA and stent implantation, mid right coronary artery. 2. Successful PTCA and stent implantation, distal right coronary artery. 3. Acute inferior wall myocardial infarction. DD:  09/17/00 TD:  09/17/00 Job: [id number]

## 2011-02-22 NOTE — Cardiovascular Report (Signed)
[place]  Patient:    Daniel Cruz, Daniel Cruz                     MRN: [id number] Proc. Date: 09/17/00 Adm. Date:  [id number] Attending:  Armanda Magic                        Cardiac Catheterization  PROCEDURE:  Left heart catheterization.  INDICATIONS:  Acute inferior myocardial infarction.  COMPLICATIONS:  None.  IV MEDICATIONS:  Versed 6 mg, fentanyl 150 mcg.  This is a 60 year old white male with no previous cardiac history and cardiac risk factors including tobacco use, who awoke this morning and subsequently on the way to work developed acute left arm numbness associated with chest tightness.  In the emergency room, EKG was consistent with acute inferior myocardial infarction and now presents for cardiac catheterization.  The patient was brought to the cardiac catheterization laboratory in the nonsedated state.  Informed consent was obtained.  The patient was connected to continuous heart rate and pulse oximetry monitoring and intermittent blood pressure monitoring.  IV Versed and fentanyl were used for sedation.  The right groin was prepped and draped in the usual sterile fashion.  1% lidocaine was used for local anesthesia.  Using the modified Seldinger technique, a 6 French arterial sheath was placed in the right femoral artery.  Again using the modified Seldinger technique, a 6 French venous sheath was placed in the right vein.  Under fluoroscopic guidance, a 6 [place] JL4 catheter was placed into the left coronary artery.  There was difficulty in engaging the catheter and multiple attempts were performed.  The catheter was then removed and a 6 [place] Judkins JR4 was placed into the right coronary artery under fluoroscopic guidance.  Multiple cine films were taken in the RAO and LAO positions.  This catheter was then exchanged out over a wire and a 6 [place] Judkins JL4 was then attempted at engaging the left main again without success.   This catheter was then removed and a 6 [place] JL5 catheter was placed into the left coronary artery.  Multiple cine films were taken in the RAO and LAO positions.  At the end of the procedure, this catheter exchanged out for a 6 French angled pigtail catheter which was placed in the left ventricle under fluoroscopic guidance.  Left ventriculography was performed in the LAO and RAO positions.  At the end of the procedure, the pigtail catheter was exchanged out over a wire.  The patient then went on to PTCA stenting of the right coronary artery by Dr. Amil Amen.  RESULTS:  The left coronary artery trifurcated into a left anterior descending artery, a ramus branch, and the left circumflex artery.  The left main was widely patent.  The left anterior descending artery gave rise to one small diagonal branch and then there was a 50 to 60% stenosis of the proximal LAD at the takeoff of the septal perforator.  The LAD was diffusely diseased throughout its course up to 50%.  It also gave rise to a second diagonal which was widely patent.  The LAD traversed to the apex.  The ramus branch was widely patent throughout its course and was a large vessel.  The left circumflex gave rise to a very small OM and then bifurcated distally into two larger OM branches all of which were widely patent.  The right coronary artery was occluded completely 100% at its proximal portion.  Left ventriculography was performed in the RAO and LAO positions.  There was evidence of inferobasal hypokinesis as well as inferior septal hypokinesis.  Ejection fraction was preserved at 55%.  IMPRESSION: 1. Acute inferior myocardial infarction. 2. One vessel obstructive coronary artery disease. 3. Mild inferolateral and inferoseptal hypokinesis with ejection fraction of    55%.  PLAN:  PTCA stenting of the right coronary artery by Dr. Corliss Marcus. DD:  09/17/00 TD:  09/17/00 Job: [id number]

## 2011-02-22 NOTE — Discharge Summary (Signed)
Daniel Cruz            ACCOUNT NO.:  [id number]   MEDICAL RECORD NO.:  [id number]          PATIENT TYPE:  INP   LOCATION:  2503                         FACILITY:  [facility]   PHYSICIAN:  Armanda Magic, M.D.     DATE OF BIRTH:  1950/11/02   DATE OF ADMISSION:  01/26/2009  DATE OF DISCHARGE:  01/28/2009                               DISCHARGE SUMMARY   DISCHARGE DIAGNOSES:  1. Coronary artery disease status post drug-eluting stent x2 to the      left anterior descending and right coronary artery.  2. Gastroesophageal reflux disease.  3. Diabetes mellitus, resume metformin on Sunday evening.  4. Hypertension, angiotensin-converting enzyme inhibitor.   HOSPITAL COURSE:  Daniel Cruz is a 60 year old male patient who had  been seen in the office with indigestion symptoms over the past week.  Apparently, he was admitted to the hospital recently out of stay for an  episode of syncope.  He tells [place] he had borderline troponin and the  cardiac catheterization revealed an 80% blockage.  This was not  intervened upon.  He stated he had a nuclear stress test following a  cath, which was unremarkable.  He came to the office on the day of  admission with chest discomfort; therefore, he is being admitted for  further evaluation.  He was then taken to the Catheterization Lab the  following day and ultimately received drug-eluting stent x2 to the LAD  and RCA.  We kept him in the hospital overnight and felt that he was  safe to go home the following day.  His discharge labs include a BUN of  9, creatinine of 1.0, hemoglobin 13.3, hematocrit 38.5.   DISCHARGE MEDICATIONS:  1. Actos/metformin restart on Sunday evening.  2. Nexium 40 mg twice a day.  3. Enteric-coated aspirin 325 mg a day.  4. Lisinopril/hydrochlorothiazide 20/12.5 mg a day.  5. Vytorin 1 a day.  6. Glipizide 5 mg a day.  7. Centrum Silver daily.  8. Plavix 75 mg a day.  9. Sublingual nitroglycerin p.r.n. chest pain.   He  is to remain on a low-sodium, heart-healthy, diabetic diet.  Clean  over cath site gently with soap and water.  No scrubbing.  Increase  activity slowly.  No lifting over 10 pounds for 1 week.  No driving for  2 days.  With Dr. Rocky Crafts, nurse practitioner on Feb 09, 2009,  at 10:50 a.m.      Daniel Cruz, P.A.      Armanda Magic, M.D.  Electronically Signed    LB/MEDQ  D:  04/04/2009  T:  04/05/2009  Job:  [id number]

## 2011-02-22 NOTE — Discharge Summary (Signed)
[place]. [facility]  Patient:    DYSON, SEVEY                     MRN: [id number] Adm. Date:  [id number] Disc. Date: [id number] Attending:  Armanda Magic Dictator:   Anselm Lis, N.P. CC:         Redmond Baseman, M.D.   Discharge Summary  DATE OF BIRTH:  [birth year]  PRIMARY CARE PHYSICIAN:  Redmond Baseman, M.D.  PROCEDURES: 1. (September 17, 2000) Diagnostic cardiac catheterization by Dr. Armanda Magic    revealing single-vessel CASHD RCA; mild inferior basil hypokinesis with    EF of 55%. 2. (September 17, 2000) PTCA/stent mid and distal RCA (Dr. Corliss Marcus).  DISCHARGE DIAGNOSES: 1. Coronary atherosclerotic heart disease; single vessel.    a. (Dr. Armanda Magic) Diagnostic cardiac catheterization revealing 100%       right coronary artery with 50-60% mid diffuse circumflex.  Mild       inferior basil hypokinesis with ejection fraction of 55%.    b. (September 17, 2000) Percutaneous transluminal coronary angioplasty/stent       mid right coronary artery (100% reduced to 0%) and distal right coronary       artery (80% reduced to 0%).    c. Acute inferior myocardial infarction with peak CK of 1396 with MB       fraction 273.1 and troponin I of 58.23.    d. Reperfusions/dysrhythmias: A few runs of nonsustained ventricular       tachycardia and atrial flutter post myocardial infarction; no further       dysrhythmias two days prior to discharge. 2. History of tobacco use: Patient consulted about dangers to health and    smoking cessation; he appears committed to discontinuation of tobacco use. 3. Dyslipidemia: Fasting stat profile revealed cholesterol of 152,    triglycerides 93, HDL of 32, LDL of 101.  Started on low-dose Zocor 20 mg    p.o. q.d. for treatment of low HDL with anticipated follow up in our clinic    with repeat fasting stat in six weeks post discharge. 4. History of gastroesophageal reflux disease: Discharged on  Prilosec.  PLAN: 1. Patient discharged home in stable and improved condition. 2. Discharge medications:    a. (NEW) Nitrolingual spray 0.4 mg sublingual or on tongue q.104m. p.r.n.       chest pain up to three sprays in 15 minutes.    b. (NEW) Plavix 75 mg one p.o. q.d. for three weeks take with food.    c. (NEW) Lopressor 50 mg tab p.o. b.i.d.    d. (NEW) Lisinopril 5 mg p.o. q.d.    e. (NEW) Enteric-coated aspirin 325 mg once daily.    f. Prilosec 20 mg once daily.    g. (NEW) Vitamin E 400 international units once daily.    h. (NEW) Vitamin E 500 mg p.o. q.d.    i. (NEW) Zocor 20 mg once daily take with evening meal. 3. Activity: As outlined by cardiac rehab nurse. 4. Diet: Low fat, low cholesterol. 5. Wound care: May shower. 6. Special instructions:    a. Stop smoking.    b. Call our clinic if you develop large amount of swelling or bruising in       the groin area. 7. Follow-up: Dr. Armanda Magic, Friday, September 26, 2000 at 2:15 p.m.  HOSPITAL COURSE:  Mr. Kreed Kauffman is a very pleasant 60 year old gentleman without known cardiac history  who, on the way to work, developed left arm numbness and substernal chest tightness with associated diaphoresis. In the emergency room, he was found to have changes consistent with acute inferior myocardial infarction.  He was started on IV heparin, IV nitroglycerin and given oral aspirin and 150 ______ Plavix.  He was given 5 mg IV Lopressor.  Diagnostic cardiac catheterization was performed by Dr. Armanda Magic revealing 100% occluded RCA, 50-60% mid diffuse circumflex disease.  No significant CAD in the LAD.  Mild inferior basil hypokinesis with an EF of approximately 55%.  Dr. Corliss Marcus subsequently performed PTCA/stent of mid and distal RCA with reduction of stenosis from 100% to 0% and 80% to 0%, respectively.  Patient did have evidence of sinus brady during course of procedure; sinus rhythm at time of close with good blood  pressure.  Post procedure, patient did well without subjective complaints.  He did have a short run of nonsustained ventricular tachycardia on day of admission post procedure and subsequently two episodes of atrial flutter nonsustained on hospital day #1.  Otherwise did well without further dysrhythmias, ambulating up and about with cardiac rehab.  He committed to smoking cessation.  He was signed up for cardiac rehab phase II as an outpatient.  He is discharged home in stable condition with planned follow up with Dr. Mayford Knife in one week, enroll in cardiac rehab phase II and recheck of his lipid profile in six weeks.  LABORATORY TESTS AND DATA:  Chest x-ray revealed an elongated ectatic thoracic aorta without infiltrate or edematous changes.  Wbcs 13.1; 14.6 on first hospital day.  Hemoglobin 15.2, hematocrit 41.8, platelets of 257.  Admission coagulopathies were within normal range.  Sodium 138, potassium 3.7, chloride 104, CO2 22, glucose of 164; 141 at time of discharge.  BUN 14, creatinine 1.0, LFTs within normal range.  First CK of 84 with MB fraction 1.7, troponin I of 0.03, second CK of 1396 with MB fraction of 273 and troponin I of 58.23. Third CK of 1224 with MB fraction of 200.6.  Cholesterol profile revealed cholesterol 152, triglycerides 93, HDL of 32, LDL of 101.  Please note total time preparing discharge was greater than 30 minutes including filling out and reviewing discharge sheet with patient and dictating discharge summary. DD:  09/29/00 TD:  09/30/00 Job: [id number]

## 2011-02-22 NOTE — H&P (Signed)
[place]. [facility]  Patient:    Daniel Cruz, Daniel Cruz                     MRN: [id number] Adm. Date:  [id number] Attending:  Armanda Magic                         History and Physical  CHIEF COMPLAINT:  Left arm numbness and chest tightness.  HISTORY OF PRESENT ILLNESS:  This is a 60 year old white male with no previous cardiac history who awoke this morning feeling fine. On his way to work he developed left arm numbness and substernal chest tightness.  He broke out in a sweat.  He denied any nausea, vomiting or shortness of breath.  In the emergency room he was found to have anterior ST segment elevation with ST segment depression in V2 and V3.  He was started on IV heparin and nitroglycerin and taken to the catheterization laboratory.  PAST MEDICAL HISTORY:  Significant for GE reflux disease.  ALLERGIES:  He has no known drug allergies.  MEDICATIONS:  Prilosec 20 mg a day.  SOCIAL HISTORY:  He is married with children.  He smokes one pack per day. Occasional alcohol use.  No IV drug abuse.  He is Doctor, general practice at [employer].  FAMILY HISTORY:  His mother died of emphysema. Father died of CVA.  No coronary disease in the family.  PHYSICAL EXAMINATION:  VITAL SIGNS:  Blood pressure 147/100 with a heart rate of 75.  GENERAL:  He is a well-developed, well-nourished, obese white male in no acute distress.  HEENT:  Pupils are equal, round and react to light and accommodation. Extraocular muscles intact bilaterally.  No scleral icterus.  NECK:  Supple without lymphadenopathy.  No bruits.  +2 carotid upstrokes bilaterally.  LUNGS:  Clear to auscultation throughout.  HEART:  Regular rate and rhythm.  No murmurs, rubs or gallops.  Normal S1 and S2.  ABDOMEN: Soft, nontender, nondistended with active bowel sounds.  No hepatosplenomegaly.  No bruits.  No masses.  EXTREMITIES:  No cyanosis, erythema or edema.  +2 dorsalis pedis  pulses bilaterally.  NEUROLOGIC:  He is alert and oriented times three.  LABORATORY DATA:  Sodium 142, potassium 3.8, chloride 108, bicarbonate 23, BUN 13, creatinine 1.2, glucose 159.  Hemoglobin 16, hematocrit 48.  Chest x-ray is pending.  EKG shows normal sinus rhythm with ST segment elevation inferiorly consistent with acute inferior MI.  There were changes in V2 with ST segment depression.  ASSESSMENT AND PLAN: 1. Acute inferior myocardial infarction.    PLAN:  Emergent catheterization.  Start IV heparin, aspirin and IV    nitroglycerin drip.  Give Plavix 150 mg now. Lopressor 5 mg IV now and then    25 mg p.o. q.6h.  Check fasting with a panel in the morning. 2. Gastroesophageal reflux disease.    PLAN:  Prevacid. 3. Questionable lipid status.    PLAN:  Will check fasting lipid panel. DD:  09/17/00 TD:  09/17/00 Job: [id number]

## 2011-04-24 ENCOUNTER — Encounter: Payer: BLUE CROSS/BLUE SHIELD | Admitting: Internal Medicine

## 2011-05-15 ENCOUNTER — Encounter: Payer: Self-pay | Admitting: Internal Medicine

## 2011-05-15 ENCOUNTER — Ambulatory Visit (INDEPENDENT_AMBULATORY_CARE_PROVIDER_SITE_OTHER): Payer: BC Managed Care – PPO | Admitting: Internal Medicine

## 2011-05-15 DIAGNOSIS — I2510 Atherosclerotic heart disease of native coronary artery without angina pectoris: Secondary | ICD-10-CM

## 2011-05-15 DIAGNOSIS — E119 Type 2 diabetes mellitus without complications: Secondary | ICD-10-CM

## 2011-05-15 DIAGNOSIS — I10 Essential (primary) hypertension: Secondary | ICD-10-CM

## 2011-05-15 DIAGNOSIS — Z0000 Encounter for general adult medical examination without abnormal findings: Secondary | ICD-10-CM

## 2011-05-15 LAB — COMPREHENSIVE METABOLIC PANEL
ALT: 17 U/L (ref 0–53)
Albumin: 4.2 g/dL (ref 3.5–5.2)
CO2: 27 mEq/L (ref 19–32)
Calcium: 9.2 mg/dL (ref 8.4–10.5)
Chloride: 105 mEq/L (ref 96–112)
Creatinine, Ser: 0.9 mg/dL (ref 0.4–1.5)
GFR: 87.38 mL/min (ref >60.00)
Potassium: 4.5 mEq/L (ref 3.5–5.1)

## 2011-05-15 LAB — CBC WITH DIFFERENTIAL/PLATELET
Basophils Absolute: 0 10*3/uL (ref 0.0–0.1)
Eosinophils Absolute: 0.1 10*3/uL (ref 0.0–0.7)
Hemoglobin: 12.7 g/dL — ABNORMAL LOW (ref 13.0–17.0)
Lymphocytes Relative: 23 % (ref 12.0–46.0)
Monocytes Relative: 11.1 % (ref 3.0–12.0)
Neutrophils Relative %: 62.8 % (ref 43.0–77.0)
Platelets: 197 10*3/uL (ref 150.0–400.0)
RDW: 15.2 % — ABNORMAL HIGH (ref 11.5–14.6)

## 2011-05-15 LAB — LIPID PANEL
Cholesterol: 103 mg/dL (ref 0–200)
Total CHOL/HDL Ratio: 2
Triglycerides: 66 mg/dL (ref 0.0–149.0)

## 2011-05-15 LAB — PSA: PSA: 0.86 ng/mL (ref 0.10–4.00)

## 2011-05-15 LAB — HEMOGLOBIN A1C: Hgb A1c MFr Bld: 6.6 % — ABNORMAL HIGH (ref 4.6–6.5)

## 2011-05-15 LAB — TSH: TSH: 0.62 u[IU]/mL (ref 0.35–5.50)

## 2011-05-15 NOTE — Assessment & Plan Note (Signed)
Recommend to check his BP 3-4 times a month, goal less than 140 /85

## 2011-05-15 NOTE — Assessment & Plan Note (Signed)
Due for labs

## 2011-05-15 NOTE — Progress Notes (Signed)
  Subjective:    Patient ID: Daniel Cruz, male    DOB: 05-12-1951, 60 y.o.   MRN: [id number]  HPI CPX Had achilles tendinitis , saw podiatry, better   Past Medical History  Diagnosis Date  . Syncope 01/2009    while in [place]; CT's ok, EEG neg, had a cath in [place] w/ 80% lesion (not stented), had CP/indgestion, was re-eval by Dr.Turner, re-cath in [place] had 2 stents on 4/10, was then refered to EP Dr.Allred   . CAD (coronary artery disease)     dr Mayford Knife, MI 2001 (stents x 2),   . OSA (obstructive sleep apnea)     can't tol. a CPAP  . UTI (lower urinary tract infection)     in the 80s and 11/09  . Fatty liver 02/2009    per u/s  . DIABETES MELLITUS, TYPE II 02/26/2007   Past Surgical History  Procedure Date  . Septoplasty     w/ bilateral inferior turbinate reductions  . Tonsillectomy     Family History: Daughter born with Tricuspid atresia.   Father had strokes and CAD. Mother died of emphysema. Unaware of any history of sudden death or arrhythmias. colon ca--no prostate ca--no  Social History: Lives in [place].    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   H/o tobacco but quit 2001.   ETOH- drinks socially.   Drugs- denies. exercise-- run 2 miles 4 times a week diet-- watch sometimes   Review of Systems  Constitutional: Negative for fever, fatigue and unexpected weight change.  Respiratory: Negative for cough and shortness of breath.   Cardiovascular: Negative for chest pain, palpitations and leg swelling.  Gastrointestinal: Negative for abdominal pain, diarrhea and blood in stool.  Genitourinary: Negative for dysuria, hematuria and difficulty urinating.  Psychiatric/Behavioral: The patient is not nervous/anxious.        Objective:   Physical Exam  Constitutional: He is oriented to person, place, and time. He appears well-developed. No distress.  Neck: No thyromegaly present.  Cardiovascular:  Normal rate, regular rhythm and normal heart sounds.   No murmur heard. Pulmonary/Chest: Effort normal and breath sounds normal. No respiratory distress. He has no wheezes. He has no rales.  Abdominal: Soft. Bowel sounds are normal. He exhibits no distension. There is no tenderness. There is no rebound and no guarding.  Genitourinary: Prostate normal.       brown stools,  external hemorrhoids noted  Musculoskeletal: He exhibits no edema.  Neurological: He is alert and oriented to person, place, and time.  Skin: Skin is warm and dry. He is not diaphoretic.  Psychiatric: He has a normal mood and affect. His behavior is normal. Judgment and thought content normal.          Assessment & Plan:

## 2011-05-15 NOTE — Assessment & Plan Note (Addendum)
Td 05 pneumonia 08 had Cscope 2003 negative per records reviewed. Hemoccult negative today; next Cscope 2013 Exercise discussed, doing better  Diet- discussed  Labs, planning to send a copy to Dr. Mayford Knife, the patient's cardiologist

## 2011-05-15 NOTE — Assessment & Plan Note (Signed)
Asx, will see cards in few weeks

## 2011-05-17 ENCOUNTER — Telehealth: Payer: Self-pay | Admitting: *Deleted

## 2011-05-17 NOTE — Telephone Encounter (Signed)
LMOM for Pt. Lab results mailed to patient & faxed to Cardiologist @ [phone]

## 2011-05-17 NOTE — Telephone Encounter (Signed)
Message copied by Regis Bill on Fri May 17, 2011  5:11 PM ------      Message from: Willow Ora E      Created: Thu May 16, 2011  1:52 PM       Advise patient:      Diabetes and cholesterol are under excellent control.      PSA normal      Other labs within normal as well.      Please send a copy to the patient and to the patient's cardiologist

## 2011-06-24 ENCOUNTER — Other Ambulatory Visit: Payer: Self-pay | Admitting: Internal Medicine

## 2011-08-19 ENCOUNTER — Other Ambulatory Visit: Payer: Self-pay | Admitting: Internal Medicine

## 2011-09-10 ENCOUNTER — Other Ambulatory Visit: Payer: Self-pay | Admitting: Internal Medicine

## 2011-09-10 MED ORDER — PIOGLITAZONE HCL-METFORMIN HCL 15-850 MG PO TABS: 1.0000 | ORAL_TABLET | Freq: Two times a day (BID) | ORAL | Status: DC

## 2011-09-10 NOTE — Telephone Encounter (Signed)
Rx sent to [facility]

## 2011-09-22 ENCOUNTER — Other Ambulatory Visit: Payer: Self-pay | Admitting: Internal Medicine

## 2011-10-03 ENCOUNTER — Other Ambulatory Visit: Payer: Self-pay | Admitting: Internal Medicine

## 2011-10-17 ENCOUNTER — Ambulatory Visit: Payer: BC Managed Care – PPO | Admitting: Internal Medicine

## 2011-11-01 ENCOUNTER — Ambulatory Visit (INDEPENDENT_AMBULATORY_CARE_PROVIDER_SITE_OTHER): Payer: BC Managed Care – PPO | Admitting: Internal Medicine

## 2011-11-01 DIAGNOSIS — Z23 Encounter for immunization: Secondary | ICD-10-CM

## 2011-11-01 DIAGNOSIS — I10 Essential (primary) hypertension: Secondary | ICD-10-CM

## 2011-11-01 DIAGNOSIS — M25519 Pain in unspecified shoulder: Secondary | ICD-10-CM

## 2011-11-01 DIAGNOSIS — I2510 Atherosclerotic heart disease of native coronary artery without angina pectoris: Secondary | ICD-10-CM

## 2011-11-01 DIAGNOSIS — K219 Gastro-esophageal reflux disease without esophagitis: Secondary | ICD-10-CM

## 2011-11-01 DIAGNOSIS — E119 Type 2 diabetes mellitus without complications: Secondary | ICD-10-CM

## 2011-11-01 LAB — HEMOGLOBIN A1C: Hgb A1c MFr Bld: 6.4 % — ABNORMAL HIGH (ref <5.7)

## 2011-11-01 LAB — BASIC METABOLIC PANEL
Calcium: 9.4 mg/dL (ref 8.4–10.5)
Creat: 1.05 mg/dL (ref 0.50–1.35)
Sodium: 131 mEq/L — ABNORMAL LOW (ref 135–145)

## 2011-11-01 NOTE — Assessment & Plan Note (Signed)
Has an appointment to see his cardiologist soon

## 2011-11-01 NOTE — Patient Instructions (Signed)
Check the  blood pressure 2 or 3 times a week, be sure it is less than 140/85. If it is consistently higher, let me know  

## 2011-11-01 NOTE — Progress Notes (Signed)
  Subjective:    Patient ID: Daniel Cruz, male    DOB: 1951-09-04, 61 y.o.   MRN: [id number]  HPI ROV DM-- good med compliance , no amb CBGs HTN-- good med compliance, no amb BPs C/O shoulder pain, deltoid area worse when he elevate the arm. This is going on for a few months, is actually getting better lately. No actual injury. Has not taken any medications for it   Past Medical History: --s/p syncope 4-10 while in [place]: CTs ok, EEG neg, had a cath in [place]  w/  a 80% lesion (not stented), had CP/indigestion, was re-eval by Dr Mayford Knife , re-cath in [place] had 2 stents on  4-10;  was then refered to EP Dr Johney Frame  who Rx observation     --Coronary artery disease (Dr Mayford Knife), MI 2001 (stent x  2 ), 2 stents 4-10 --diabetes mellitus, type II --GERD --Hypertension --OSA-- can't tol. a CPAP --UTI in the 80s and 11-09 --Fatty liver per u/s 02-2009  Past Surgical History:  septoplasty with bilateral inferior turbinate reductions. tonsellectomy  Family History:  Daughter born with Tricuspid atresia.  Father had strokes and CAD.  Mother died of emphysema.  Unaware of any history of sudden death or arrhythmias.  colon ca--no  prostate ca--no   Social History:  Lives in [place].  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  H/o tobacco but quit 2001.  ETOH- drinks socially.  Drugs- denies.  exercise-- run 2 miles 4 times a week  diet-- watch sometimes    Review of Systems No chest pain or shortness of breath No nausea, vomiting, diarrhea. Compliance with PPIs, no GERD symptoms. Did not get his flu shot yet. Continue to exercise, walks 2 miles in the treadmill almost daily    Objective:   Physical Exam  Constitutional: He appears well-developed. No distress.  HENT:  Head: Normocephalic and atraumatic.  Cardiovascular: Normal rate, regular rhythm and normal heart sounds.   No murmur heard. Pulmonary/Chest: Effort  normal and breath sounds normal. No respiratory distress. He has no wheezes. He has no rales.  Musculoskeletal: He exhibits no edema.       Shoulders without deformities, nontender to palpation, range of motion is actually symmetric and essentially within normal.  Skin: He is not diaphoretic.       Assessment & Plan:  Shoulder pain: Already improving rec tylenol, gradual exercise and call if no better

## 2011-11-02 ENCOUNTER — Encounter: Payer: Self-pay | Admitting: Internal Medicine

## 2011-11-02 NOTE — Assessment & Plan Note (Signed)
No change, labs  

## 2011-11-02 NOTE — Assessment & Plan Note (Signed)
Good med compliance, no amvb BPs but BP ok today No change, labs

## 2011-11-02 NOTE — Assessment & Plan Note (Signed)
Well controlled 

## 2011-11-29 ENCOUNTER — Other Ambulatory Visit: Payer: Self-pay | Admitting: Internal Medicine

## 2011-11-29 DIAGNOSIS — I10 Essential (primary) hypertension: Secondary | ICD-10-CM

## 2011-12-05 ENCOUNTER — Other Ambulatory Visit (INDEPENDENT_AMBULATORY_CARE_PROVIDER_SITE_OTHER): Payer: BC Managed Care – PPO

## 2011-12-05 DIAGNOSIS — I10 Essential (primary) hypertension: Secondary | ICD-10-CM

## 2011-12-05 LAB — BASIC METABOLIC PANEL
Calcium: 8.9 mg/dL (ref 8.4–10.5)
GFR: 81.15 mL/min (ref >60.00)
Potassium: 4 mEq/L (ref 3.5–5.1)
Sodium: 136 mEq/L (ref 135–145)

## 2011-12-09 ENCOUNTER — Encounter: Payer: Self-pay | Admitting: *Deleted

## 2011-12-21 ENCOUNTER — Other Ambulatory Visit: Payer: Self-pay | Admitting: Internal Medicine

## 2011-12-23 NOTE — Telephone Encounter (Signed)
Refill done.  

## 2012-01-13 ENCOUNTER — Other Ambulatory Visit: Payer: Self-pay | Admitting: Internal Medicine

## 2012-01-13 NOTE — Telephone Encounter (Signed)
Refill done.  

## 2012-04-14 LAB — HM DIABETES EYE EXAM: HM Diabetic Eye Exam: NEGATIVE

## 2012-04-22 ENCOUNTER — Other Ambulatory Visit: Payer: Self-pay | Admitting: Internal Medicine

## 2012-04-22 NOTE — Telephone Encounter (Signed)
Refill done.  

## 2012-04-23 ENCOUNTER — Encounter: Payer: Self-pay | Admitting: *Deleted

## 2012-06-01 ENCOUNTER — Encounter: Payer: BC Managed Care – PPO | Admitting: Internal Medicine

## 2012-06-06 ENCOUNTER — Other Ambulatory Visit: Payer: Self-pay | Admitting: Internal Medicine

## 2012-06-09 NOTE — Telephone Encounter (Signed)
Refill done.  

## 2012-06-18 ENCOUNTER — Other Ambulatory Visit: Payer: Self-pay | Admitting: Internal Medicine

## 2012-06-18 ENCOUNTER — Other Ambulatory Visit: Payer: Self-pay | Admitting: General Practice

## 2012-06-18 MED ORDER — PIOGLITAZONE HCL-METFORMIN HCL 15-850 MG PO TABS: 1.0000 | ORAL_TABLET | Freq: Two times a day (BID) | ORAL | Status: DC

## 2012-08-15 ENCOUNTER — Other Ambulatory Visit: Payer: Self-pay | Admitting: Internal Medicine

## 2012-08-17 NOTE — Telephone Encounter (Signed)
Refill done.  

## 2012-10-06 ENCOUNTER — Other Ambulatory Visit: Payer: Self-pay | Admitting: Internal Medicine

## 2012-10-06 ENCOUNTER — Encounter: Payer: Self-pay | Admitting: *Deleted

## 2012-10-06 NOTE — Telephone Encounter (Signed)
Refill done.  Letter mailed to pt to call & schedule appt.

## 2012-11-01 ENCOUNTER — Other Ambulatory Visit: Payer: Self-pay | Admitting: Internal Medicine

## 2012-11-02 NOTE — Telephone Encounter (Signed)
Refill done.  

## 2012-11-24 ENCOUNTER — Other Ambulatory Visit: Payer: Self-pay | Admitting: Internal Medicine

## 2012-11-24 NOTE — Telephone Encounter (Signed)
Refill done.  Pt has future appt 2.24.14

## 2012-11-30 ENCOUNTER — Ambulatory Visit (INDEPENDENT_AMBULATORY_CARE_PROVIDER_SITE_OTHER): Payer: BC Managed Care – PPO | Admitting: Internal Medicine

## 2012-11-30 ENCOUNTER — Encounter: Payer: Self-pay | Admitting: Internal Medicine

## 2012-11-30 VITALS — BP 132/84 | HR 68 | Temp 98.0°F | Wt 270.0 lb

## 2012-11-30 DIAGNOSIS — E119 Type 2 diabetes mellitus without complications: Secondary | ICD-10-CM

## 2012-11-30 DIAGNOSIS — Z0000 Encounter for general adult medical examination without abnormal findings: Secondary | ICD-10-CM

## 2012-11-30 DIAGNOSIS — I2510 Atherosclerotic heart disease of native coronary artery without angina pectoris: Secondary | ICD-10-CM

## 2012-11-30 DIAGNOSIS — Z2911 Encounter for prophylactic immunotherapy for respiratory syncytial virus (RSV): Secondary | ICD-10-CM

## 2012-11-30 DIAGNOSIS — Z23 Encounter for immunization: Secondary | ICD-10-CM

## 2012-11-30 LAB — CBC WITH DIFFERENTIAL/PLATELET
Basophils Absolute: 0 10*3/uL (ref 0.0–0.1)
Basophils Relative: 0.8 % (ref 0.0–3.0)
Eosinophils Absolute: 0.1 10*3/uL (ref 0.0–0.7)
Hemoglobin: 12.6 g/dL — ABNORMAL LOW (ref 13.0–17.0)
Lymphocytes Relative: 18.2 % (ref 12.0–46.0)
MCHC: 34.3 g/dL (ref 30.0–36.0)
MCV: 95.8 fl (ref 78.0–100.0)
Monocytes Absolute: 0.4 10*3/uL (ref 0.1–1.0)
Neutro Abs: 3.3 10*3/uL (ref 1.4–7.7)
RBC: 3.84 Mil/uL — ABNORMAL LOW (ref 4.22–5.81)
RDW: 14.5 % (ref 11.5–14.6)

## 2012-11-30 LAB — COMPREHENSIVE METABOLIC PANEL
ALT: 21 U/L (ref 0–53)
AST: 28 U/L (ref 0–37)
Albumin: 3.9 g/dL (ref 3.5–5.2)
Alkaline Phosphatase: 52 U/L (ref 39–117)
BUN: 25 mg/dL — ABNORMAL HIGH (ref 6–23)
Calcium: 9.3 mg/dL (ref 8.4–10.5)
Chloride: 106 mEq/L (ref 96–112)
Creatinine, Ser: 1 mg/dL (ref 0.4–1.5)
Potassium: 4 mEq/L (ref 3.5–5.1)

## 2012-11-30 LAB — PSA: PSA: 1.15 ng/mL (ref 0.10–4.00)

## 2012-11-30 LAB — TSH: TSH: 1.35 u[IU]/mL (ref 0.35–5.50)

## 2012-11-30 LAB — LIPID PANEL
HDL: 47.7 mg/dL (ref >39.00)
Total CHOL/HDL Ratio: 3

## 2012-11-30 LAB — HEMOGLOBIN A1C: Hgb A1c MFr Bld: 6.7 % — ABNORMAL HIGH (ref 4.6–6.5)

## 2012-11-30 MED ORDER — PIOGLITAZONE HCL-METFORMIN HCL 15-850 MG PO TABS: ORAL_TABLET | ORAL | Status: DC

## 2012-11-30 MED ORDER — LISINOPRIL-HYDROCHLOROTHIAZIDE 20-12.5 MG PO TABS: ORAL_TABLET | ORAL | Status: DC

## 2012-11-30 MED ORDER — EZETIMIBE-SIMVASTATIN 10-10 MG PO TABS: ORAL_TABLET | ORAL | Status: DC

## 2012-11-30 MED ORDER — PANTOPRAZOLE SODIUM 40 MG PO TBEC: DELAYED_RELEASE_TABLET | ORAL | Status: DC

## 2012-11-30 MED ORDER — CLOPIDOGREL BISULFATE 75 MG PO TABS: ORAL_TABLET | ORAL | Status: DC

## 2012-11-30 MED ORDER — GLIPIZIDE ER 5 MG PO TB24: ORAL_TABLET | ORAL | Status: DC

## 2012-11-30 NOTE — Assessment & Plan Note (Addendum)
Td 05 pneumonia 08 Shingles shot discussed , got it today had Cscope 2003 negative , Dr Randa Evens, referral done today Diet and exercise discussed, BMI discussed  Labs, planning to send a copy to Dr. Mayford Knife, the patient's cardiologist

## 2012-11-30 NOTE — Assessment & Plan Note (Signed)
Saw Dr. Mayford Knife last week, had a EKG, was told doing well

## 2012-11-30 NOTE — Patient Instructions (Addendum)
Check the  blood pressure 2 or 3 times a week, be sure it is between 110/60 and 140/85. If it is consistently higher or lower, let me . See the eye doctor at least yearly   Diabetes and Foot Care Diabetes may cause you to have a poor blood supply (circulation) to your legs and feet. Because of this, the skin may be thinner, break easier, and heal more slowly. You also may have nerve damage in your legs and feet causing decreased feeling. You may not notice minor injuries to your feet that could lead to serious problems or infections. Taking care of your feet is one of the most important things you can do for yourself.  HOME CARE INSTRUCTIONS  Do not go barefoot. Bare feet are easily injured.  Check your feet daily for blisters, cuts, and redness.  Wash your feet with warm water (not hot) and mild soap. Pat your feet and between your toes until completely dry.  Apply a moisturizing lotion that does not contain alcohol or petroleum jelly to the dry skin on your feet and to dry brittle toenails. Do not put it between your toes.  Trim your toenails straight across. Do not dig under them or around the cuticle.  Do not cut corns or calluses, or try to remove them with medicine.  Wear clean cotton socks or stockings every day. Make sure they are not too tight. Do not wear knee high stockings since they may decrease blood flow to your legs.  Wear leather shoes that fit properly and have enough cushioning. To break in new shoes, wear them just a few hours a day to avoid injuring your feet.  Wear shoes at all times, even in the house.  Do not cross your legs. This may decrease the blood flow to your feet.  If you find a minor scrape, cut, or break in the skin on your feet, keep it and the skin around it clean and dry. These areas may be cleansed with mild soap and water. Do not use peroxide, alcohol, iodine or Merthiolate.  When you remove an adhesive bandage, be sure not to harm the skin around  it.  If you have a wound, look at it several times a day to make sure it is healing.  Do not use heating pads or hot water bottles. Burns can occur. If you have lost feeling in your feet or legs, you may not know it is happening until it is too late.  Report any cuts, sores or bruises to your caregiver. Do not wait! SEEK MEDICAL CARE IF:   You have an injury that is not healing or you notice redness, numbness, burning, or tingling.  Your feet always feel cold.  You have pain or cramps in your legs and feet. SEEK IMMEDIATE MEDICAL CARE IF:   There is increasing redness, swelling, or increasing pain in the wound.  There is a red line that goes up your leg.  Pus is coming from a wound.  You develop an unexplained oral temperature above 102 F (38.9 C), or as your caregiver suggests.  You notice a bad smell coming from an ulcer or wound. MAKE SURE YOU:   Understand these instructions.  Will watch your condition.  Will get help right away if you are not doing well or get worse. Document Released: 09/20/2000 Document Revised: 12/16/2011 Document Reviewed: 03/29/2009 [facility] Patient Information 2013 [place], [place].

## 2012-11-30 NOTE — Progress Notes (Signed)
  Subjective:    Patient ID: Daniel Cruz, male    DOB: May 13, 1951, 62 y.o.   MRN: [id number]  HPI CPX  Past Medical History: --s/p syncope 4-10 while in [place]: CTs ok, EEG neg, had a cath in [place]  w/  a 80% lesion (not stented), had CP/indigestion, was re-eval by Dr Mayford Knife , re-cath in [place] had 2 stents on  4-10;  was then refered to EP Dr Johney Frame  who Rx observation      --Coronary artery disease (Dr Mayford Knife), MI 2001 (stent x  2 ), 2 stents 4-10 --diabetes mellitus, type II --GERD --Hypertension --OSA-- can't tol. a CPAP --UTI in the 80s and 11-09 --Fatty liver per u/s 02-2009  Past Surgical History: septoplasty with bilateral inferior turbinate reductions. tonsellectomy  Family History:   Daughter born with Tricuspid atresia.   Father had strokes and CAD.   Mother died of emphysema.   Unaware of any history of sudden death or arrhythmias.   colon ca--no   prostate ca--no   Social History:   Lives in [place].   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   H/o tobacco but quit 2001.   ETOH- drinks socially.   Drugs- denies.   exercise-- restarting slowly, had plantar fasceitis   diet-- regular      Review of Systems Medication list reviewed, good compliance. No ambulatory blood sugars or BPs. No chest pain or shortness or breath, recently saw cardiology. No  nausea, vomiting, diarrhea or blood in the stools. No dysuria or gross hematuria. Have symptoms consistent with plantar fasciitis, saw another practitioner, slowly recovering.    Objective:   Physical Exam General -- alert, well-developed, BMI 38.   Neck --no thyromegaly , normal carotid pulse Lungs -- normal respiratory effort, no intercostal retractions, no accessory muscle use, and normal breath sounds.   Heart-- normal rate, regular rhythm, no murmur, and no gallop.   Abdomen--soft, non-tender, no distention, no masses, no HSM, no guarding,  and no rigidity.   Extremities-- no pretibial edema bilaterally Rectal-- No external abnormalities noted. Normal sphincter tone. No rectal masses or tenderness. Brown stool, Hemoccult negative Prostate:  Prostate gland firm and smooth, no enlargement, nodularity, tenderness, mass, asymmetry or induration. Neurologic-- alert & oriented X3 and strength normal in all extremities. Psych-- Cognition and judgment appear intact. Alert and cooperative with normal attention span and concentration.  not anxious appearing and not depressed appearing.        Assessment & Plan:

## 2012-11-30 NOTE — Assessment & Plan Note (Signed)
Due for a hemoglobin A1c, feet care discussed. Recommend to see an eye doctor yearly.

## 2012-12-03 ENCOUNTER — Encounter: Payer: Self-pay | Admitting: *Deleted

## 2013-02-03 ENCOUNTER — Encounter: Payer: Self-pay | Admitting: Internal Medicine

## 2013-05-17 ENCOUNTER — Encounter: Payer: Self-pay | Admitting: Internal Medicine

## 2013-06-01 ENCOUNTER — Ambulatory Visit: Payer: BC Managed Care – PPO | Admitting: Internal Medicine

## 2013-06-18 ENCOUNTER — Encounter: Payer: Self-pay | Admitting: Internal Medicine

## 2013-06-30 ENCOUNTER — Encounter: Payer: Self-pay | Admitting: Internal Medicine

## 2013-06-30 ENCOUNTER — Ambulatory Visit (INDEPENDENT_AMBULATORY_CARE_PROVIDER_SITE_OTHER): Payer: BC Managed Care – PPO | Admitting: Internal Medicine

## 2013-06-30 VITALS — BP 130/77 | HR 75 | Temp 98.1°F | Wt 273.8 lb

## 2013-06-30 DIAGNOSIS — E119 Type 2 diabetes mellitus without complications: Secondary | ICD-10-CM

## 2013-06-30 DIAGNOSIS — I10 Essential (primary) hypertension: Secondary | ICD-10-CM

## 2013-06-30 DIAGNOSIS — Z23 Encounter for immunization: Secondary | ICD-10-CM

## 2013-06-30 DIAGNOSIS — M25579 Pain in unspecified ankle and joints of unspecified foot: Secondary | ICD-10-CM

## 2013-06-30 DIAGNOSIS — K219 Gastro-esophageal reflux disease without esophagitis: Secondary | ICD-10-CM

## 2013-06-30 LAB — BASIC METABOLIC PANEL
BUN: 19 mg/dL (ref 6–23)
CO2: 28 mEq/L (ref 19–32)
Chloride: 104 mEq/L (ref 96–112)
Creatinine, Ser: 0.9 mg/dL (ref 0.4–1.5)
Potassium: 4.1 mEq/L (ref 3.5–5.1)

## 2013-06-30 LAB — ALT: ALT: 21 U/L (ref 0–53)

## 2013-06-30 LAB — HEMOGLOBIN A1C: Hgb A1c MFr Bld: 7.2 % — ABNORMAL HIGH (ref 4.6–6.5)

## 2013-06-30 NOTE — Assessment & Plan Note (Signed)
Well-controlled, no change, check a BMP 

## 2013-06-30 NOTE — Patient Instructions (Signed)
Get your blood work before you leave  Next visit in 6 months for a physical exam   Please make an appointment before you leave the office today (or call few weeks in advance)     

## 2013-06-30 NOTE — Assessment & Plan Note (Signed)
Left plantar fascia it is improving however has developed pain at the right Achilles tendon , planning to use a boot he already has, recommend to call for a referral to Dr. Katrinka Blazing if he is not improving soon

## 2013-06-30 NOTE — Progress Notes (Signed)
  Subjective:    Patient ID: Daniel Cruz, male    DOB: [birth year], 63 y.o.   MRN: [id number]  HPI Routine office visit Pain--left plantar fasciitis is improving however has developed pain at the right Achilles tendon. Diabetes, good medication compliance, not ambulatory CBGs High cholesterol good medication compliance GERD--saw GI, he discontinue Pepcid, symptoms well controlled with Protonix only.  Past Medical History  Diagnosis Date  . Syncope 01/2009    while in [place]; CT's ok, EEG neg, had a cath in [place] w/ 80% lesion (not stented), had CP/indgestion, was re-eval by Dr.Turner, re[place]cath in [place] had 2 stents on 4/10, was then refered to EP Dr.Allred   . CAD (coronary artery disease)     dr Mayford Knife, [place] 2001 (stents x 2),   . OSA (obstructive sleep apnea)     can't tol. a CPAP  . UTI (lower urinary tract infection)     in the 80s and 11/09  . Fatty liver 02/2009    per u/s  . DIABETES MELLITUS, TYPE II 02/26/2007  . HTN (hypertension)   . GERD (gastroesophageal reflux disease)    Past Surgical History  Procedure Laterality Date  . Septoplasty      w/ bilateral inferior turbinate reductions  . Tonsillectomy     History   Social History  . Marital Status: Married    Spouse Name: N/A    Number of Children: 2  . Years of Education: N/A   Occupational History  . Vice Biomedical engineer at [employer]    Social History Main Topics  . Smoking status: Former Games developer  . Smokeless tobacco: Not on file  . Alcohol Use: 0.0 oz/week     Comment: socially  . Drug Use: No  . Sexual Activity: Not on file   Other Topics Concern  . Not on file   Social History Narrative   Lives in [place], [place]   Exercise- walks 2-3 miles a day             Review of Systems Diet-- about the same  Exercise-- limited by foot pain No  CP, SOB  No nausea, vomiting diarrhea No blood in the stools No anxiety, depression     Objective:   Physical  Exam BP 130/77  Pulse 75  Temp(Src) 98.1 F (36.7 C)  Wt 273 lb 12.8 oz (124.195 kg)  BMI 38.72 kg/m2  SpO2 97% General -- alert, well-developed, NAD.   Lungs -- normal respiratory effort, no intercostal retractions, no accessory muscle use, and normal breath sounds.  Heart-- normal rate, regular rhythm, no murmur.   Neurologic--  alert & oriented X3.  Speech normal, gait normal, strength normal in all extremities.   Psych-- Cognition and judgment appear intact. Cooperative with normal attention span and concentration. No anxious appearing , no depressed appearing.          Assessment & Plan:

## 2013-06-30 NOTE — Assessment & Plan Note (Signed)
Patient was on Pepcid only, symptoms not well-controlled, Protonix was added. Saw GI a few months ago, he recommended to discontinue Pepcid and continue Protonix, symptoms remain well controlled. I am somehow concerned about interactions between Plavix and Protonix, patient is also aware , We agreed to change Protonix to every other day

## 2013-06-30 NOTE — Assessment & Plan Note (Signed)
Reviewed with the patient all his previous A1c's, diabetes is well controlled, continue with same medications, check the A1c and LFTs

## 2013-07-02 ENCOUNTER — Telehealth: Payer: Self-pay | Admitting: Internal Medicine

## 2013-07-02 NOTE — Telephone Encounter (Signed)
Call patient, diabetes needs better control, increase Glucotrol XL 5 mg from one tablet to 2 tablets daily, send a new prescription I am releasing results to my chart as well.

## 2013-07-05 MED ORDER — GLIPIZIDE ER 5 MG PO TB24: 5.0000 mg | ORAL_TABLET | Freq: Two times a day (BID) | ORAL | Status: DC

## 2013-07-05 NOTE — Telephone Encounter (Signed)
Pt notified via tele, prescription ordered and sent. DJR

## 2013-07-14 ENCOUNTER — Emergency Department (HOSPITAL_BASED_OUTPATIENT_CLINIC_OR_DEPARTMENT_OTHER): Payer: BC Managed Care – PPO

## 2013-07-14 ENCOUNTER — Encounter (HOSPITAL_BASED_OUTPATIENT_CLINIC_OR_DEPARTMENT_OTHER): Payer: Self-pay | Admitting: Emergency Medicine

## 2013-07-14 ENCOUNTER — Observation Stay (HOSPITAL_BASED_OUTPATIENT_CLINIC_OR_DEPARTMENT_OTHER)
Admission: EM | Admit: 2013-07-14 | Discharge: 2013-07-15 | Disposition: A | Discharge status: Home / Self Care | Payer: BC Managed Care – PPO | Attending: Cardiology | Admitting: Cardiology

## 2013-07-14 DIAGNOSIS — E119 Type 2 diabetes mellitus without complications: Secondary | ICD-10-CM | POA: Insufficient documentation

## 2013-07-14 DIAGNOSIS — R079 Chest pain, unspecified: Secondary | ICD-10-CM

## 2013-07-14 DIAGNOSIS — I10 Essential (primary) hypertension: Secondary | ICD-10-CM | POA: Diagnosis present

## 2013-07-14 DIAGNOSIS — E118 Type 2 diabetes mellitus with unspecified complications: Secondary | ICD-10-CM | POA: Diagnosis present

## 2013-07-14 DIAGNOSIS — G4733 Obstructive sleep apnea (adult) (pediatric): Secondary | ICD-10-CM | POA: Insufficient documentation

## 2013-07-14 DIAGNOSIS — I252 Old myocardial infarction: Secondary | ICD-10-CM | POA: Insufficient documentation

## 2013-07-14 DIAGNOSIS — R0789 Other chest pain: Principal | ICD-10-CM | POA: Insufficient documentation

## 2013-07-14 DIAGNOSIS — K7689 Other specified diseases of liver: Secondary | ICD-10-CM | POA: Insufficient documentation

## 2013-07-14 DIAGNOSIS — E785 Hyperlipidemia, unspecified: Secondary | ICD-10-CM | POA: Diagnosis present

## 2013-07-14 DIAGNOSIS — Z9861 Coronary angioplasty status: Secondary | ICD-10-CM | POA: Insufficient documentation

## 2013-07-14 DIAGNOSIS — K219 Gastro-esophageal reflux disease without esophagitis: Secondary | ICD-10-CM | POA: Insufficient documentation

## 2013-07-14 DIAGNOSIS — Z79899 Other long term (current) drug therapy: Secondary | ICD-10-CM | POA: Insufficient documentation

## 2013-07-14 DIAGNOSIS — I200 Unstable angina: Secondary | ICD-10-CM | POA: Diagnosis present

## 2013-07-14 DIAGNOSIS — Z8679 Personal history of other diseases of the circulatory system: Secondary | ICD-10-CM

## 2013-07-14 DIAGNOSIS — N390 Urinary tract infection, site not specified: Secondary | ICD-10-CM | POA: Insufficient documentation

## 2013-07-14 DIAGNOSIS — I2510 Atherosclerotic heart disease of native coronary artery without angina pectoris: Secondary | ICD-10-CM | POA: Diagnosis present

## 2013-07-14 LAB — HEMOGLOBIN A1C
Hgb A1c MFr Bld: 6.5 % — ABNORMAL HIGH (ref <5.7)
Mean Plasma Glucose: 140 mg/dL — ABNORMAL HIGH (ref <117)

## 2013-07-14 LAB — COMPREHENSIVE METABOLIC PANEL
ALT: 19 U/L (ref 0–53)
ALT: 18 U/L (ref 0–53)
AST: 27 U/L (ref 0–37)
AST: 25 U/L (ref 0–37)
Albumin: 4.3 g/dL (ref 3.5–5.2)
Albumin: 3.7 g/dL (ref 3.5–5.2)
CO2: 25 mEq/L (ref 19–32)
CO2: 25 mEq/L (ref 19–32)
Calcium: 9 mg/dL (ref 8.4–10.5)
Chloride: 103 mEq/L (ref 96–112)
Creatinine, Ser: 1 mg/dL (ref 0.50–1.35)
GFR calc non Af Amer: 78 mL/min — ABNORMAL LOW (ref >90)
GFR calc non Af Amer: 90 mL/min — ABNORMAL LOW (ref >90)
Glucose, Bld: 107 mg/dL — ABNORMAL HIGH (ref 70–99)
Glucose, Bld: 69 mg/dL — ABNORMAL LOW (ref 70–99)
Potassium: 3.8 mEq/L (ref 3.5–5.1)
Sodium: 137 mEq/L (ref 135–145)
Sodium: 137 mEq/L (ref 135–145)
Total Bilirubin: 0.3 mg/dL (ref 0.3–1.2)
Total Protein: 6.5 g/dL (ref 6.0–8.3)

## 2013-07-14 LAB — GLUCOSE, CAPILLARY
Glucose-Capillary: 74 mg/dL (ref 70–99)
Glucose-Capillary: 78 mg/dL (ref 70–99)

## 2013-07-14 LAB — CBC
Hemoglobin: 13.1 g/dL (ref 13.0–17.0)
MCHC: 34.5 g/dL (ref 30.0–36.0)
Platelets: 195 10*3/uL (ref 150–400)
Platelets: 200 10*3/uL (ref 150–400)
RBC: 4.09 MIL/uL — ABNORMAL LOW (ref 4.22–5.81)
RBC: 3.98 MIL/uL — ABNORMAL LOW (ref 4.22–5.81)
RDW: 15 % (ref 11.5–15.5)
WBC: 5.1 10*3/uL (ref 4.0–10.5)
WBC: 5.4 10*3/uL (ref 4.0–10.5)

## 2013-07-14 LAB — TROPONIN I
Troponin I: <0.3 ng/mL (ref <0.30)
Troponin I: <0.3 ng/mL (ref <0.30)
Troponin I: <0.3 ng/mL (ref <0.30)

## 2013-07-14 LAB — APTT: aPTT: 28 seconds (ref 24–37)

## 2013-07-14 LAB — PROTIME-INR: INR: 1.05 (ref 0.00–1.49)

## 2013-07-14 MED ORDER — ASPIRIN 81 MG PO CHEW: 324.0000 mg | CHEWABLE_TABLET | ORAL | Status: DC

## 2013-07-14 MED ORDER — PANTOPRAZOLE SODIUM 20 MG PO TBEC
20.0000 mg | DELAYED_RELEASE_TABLET | Freq: Every day | ORAL | Status: DC
  Filled 2013-07-14 (×2): qty 1

## 2013-07-14 MED ORDER — ASPIRIN 81 MG PO CHEW
324.0000 mg | CHEWABLE_TABLET | Freq: Once | ORAL | Status: AC
  Administered 2013-07-14: 324 mg via ORAL

## 2013-07-14 MED ORDER — ONDANSETRON HCL 4 MG/2ML IJ SOLN: 4.0000 mg | Freq: Four times a day (QID) | INTRAMUSCULAR | Status: DC | PRN

## 2013-07-14 MED ORDER — HYDROCHLOROTHIAZIDE 12.5 MG PO CAPS
12.5000 mg | ORAL_CAPSULE | Freq: Every day | ORAL | Status: DC
  Administered 2013-07-14: 12.5 mg via ORAL
  Filled 2013-07-14 (×2): qty 1

## 2013-07-14 MED ORDER — ASPIRIN 81 MG PO CHEW
CHEWABLE_TABLET | ORAL | Status: AC
  Administered 2013-07-14: 324 mg via ORAL
  Filled 2013-07-14: qty 4

## 2013-07-14 MED ORDER — ASPIRIN 325 MG PO TABS: 81.0000 mg | ORAL_TABLET | Freq: Every day | ORAL | Status: DC

## 2013-07-14 MED ORDER — NITROGLYCERIN 0.4 MG SL SUBL: 0.4000 mg | SUBLINGUAL_TABLET | SUBLINGUAL | Status: DC | PRN

## 2013-07-14 MED ORDER — METOPROLOL TARTRATE 12.5 MG HALF TABLET
12.5000 mg | ORAL_TABLET | Freq: Two times a day (BID) | ORAL | Status: DC
  Administered 2013-07-14: 12.5 mg via ORAL
  Filled 2013-07-14 (×4): qty 1

## 2013-07-14 MED ORDER — LISINOPRIL-HYDROCHLOROTHIAZIDE 20-12.5 MG PO TABS: 1.0000 | ORAL_TABLET | Freq: Every day | ORAL | Status: DC

## 2013-07-14 MED ORDER — ENOXAPARIN SODIUM 40 MG/0.4ML ~~LOC~~ SOLN
40.0000 mg | SUBCUTANEOUS | Status: DC
  Administered 2013-07-14: 40 mg via SUBCUTANEOUS
  Filled 2013-07-14 (×2): qty 0.4

## 2013-07-14 MED ORDER — NITROGLYCERIN 0.4 MG SL SUBL
SUBLINGUAL_TABLET | SUBLINGUAL | Status: AC
  Administered 2013-07-14: 0.4 mg via SUBLINGUAL
  Filled 2013-07-14: qty 25

## 2013-07-14 MED ORDER — ACETAMINOPHEN 325 MG PO TABS: 650.0000 mg | ORAL_TABLET | ORAL | Status: DC | PRN

## 2013-07-14 MED ORDER — GLIPIZIDE ER 5 MG PO TB24
5.0000 mg | ORAL_TABLET | Freq: Two times a day (BID) | ORAL | Status: DC
  Filled 2013-07-14 (×4): qty 1

## 2013-07-14 MED ORDER — EZETIMIBE 10 MG PO TABS
10.0000 mg | ORAL_TABLET | Freq: Every day | ORAL | Status: DC
  Administered 2013-07-14: 10 mg via ORAL
  Filled 2013-07-14 (×2): qty 1

## 2013-07-14 MED ORDER — ASPIRIN EC 81 MG PO TBEC
81.0000 mg | DELAYED_RELEASE_TABLET | Freq: Every day | ORAL | Status: DC
  Filled 2013-07-14: qty 1

## 2013-07-14 MED ORDER — EZETIMIBE-SIMVASTATIN 10-10 MG PO TABS: 1.0000 | ORAL_TABLET | Freq: Every day | ORAL | Status: DC

## 2013-07-14 MED ORDER — ALUM & MAG HYDROXIDE-SIMETH 200-200-20 MG/5ML PO SUSP
30.0000 mL | ORAL | Status: DC | PRN
  Administered 2013-07-14: 30 mL via ORAL
  Filled 2013-07-14: qty 30

## 2013-07-14 MED ORDER — NITROGLYCERIN 0.4 MG SL SUBL
0.4000 mg | SUBLINGUAL_TABLET | SUBLINGUAL | Status: DC | PRN
  Administered 2013-07-14: 0.4 mg via SUBLINGUAL
  Filled 2013-07-14: qty 25

## 2013-07-14 MED ORDER — LISINOPRIL 20 MG PO TABS
20.0000 mg | ORAL_TABLET | Freq: Every day | ORAL | Status: DC
  Administered 2013-07-14: 20 mg via ORAL
  Filled 2013-07-14 (×2): qty 1

## 2013-07-14 MED ORDER — GI COCKTAIL ~~LOC~~
30.0000 mL | Freq: Once | ORAL | Status: AC
  Administered 2013-07-14: 30 mL via ORAL
  Filled 2013-07-14: qty 30

## 2013-07-14 MED ORDER — ASPIRIN 300 MG RE SUPP: 300.0000 mg | RECTAL | Status: DC

## 2013-07-14 MED ORDER — CLOPIDOGREL BISULFATE 75 MG PO TABS
75.0000 mg | ORAL_TABLET | Freq: Every day | ORAL | Status: DC
  Administered 2013-07-14: 75 mg via ORAL
  Filled 2013-07-14: qty 1

## 2013-07-14 MED ORDER — SIMVASTATIN 10 MG PO TABS
10.0000 mg | ORAL_TABLET | Freq: Every day | ORAL | Status: DC
  Administered 2013-07-14: 10 mg via ORAL
  Filled 2013-07-14 (×2): qty 1

## 2013-07-14 NOTE — ED Provider Notes (Signed)
Daniel Cruz soon from Dr. Read Drivers at shift change. Patient with history of coronary artery disease status post stenting in the past. He woke this morning and developed tightness in his chest associated with diaphoresis and shortness of breath while he was stepping out of the shower. He has not had heart problems since 2009 at which time additional stents were placed. Workup was initiated in the ER including laboratory studies and EKG. The EKG reveals no acute ST changes, however he is noted to be in a ventricular bigeminy which he states he has not had before. I am uncertain of the significance of this. The troponin at this point is negative the patient is pain free with sublingual nitroglycerin in the ER.  I have discussed the case with Dr. Anne Fu from cardiology who agrees the patient should be observed. He advises against any additional anticoagulants at this time since the patient is pain-free. He will be transferred to [facility] for further observation and workup.  Geoffery Lyons, MD 07/14/13 [phone]

## 2013-07-14 NOTE — ED Provider Notes (Addendum)
CSN: [id number]     Arrival date & time 07/14/13  4098 History   First MD Initiated Contact with Patient 07/14/13 [phone]     Chief Complaint  Patient presents with  . Chest Pain   (Consider location/radiation/quality/duration/timing/severity/associated sxs/prior Treatment) HPI This is a 62 year old male with history of coronary artery disease and cardiac stenting. He is here with about 30 minutes of chest tightness which he rates as a 4/10 associated with shortness of breath and diaphoresis. He denies nausea or radiation. He states that symptoms are similar to previous MIs. He has not taken any nitroglycerin or other medications for this. He has no history of an arrhythmia.  Past Medical History  Diagnosis Date  . Syncope 01/2009    while in [place]; CT's ok, EEG neg, had a cath in [place] w/ 80% lesion (not stented), had CP/indgestion, was re-eval by Dr.Turner, re[place]cath in [place] had 2 stents on 4/10, was then refered to EP Dr.Allred   . CAD (coronary artery disease)     dr Mayford Knife, [place] 2001 (stents x 2),   . OSA (obstructive sleep apnea)     can't tol. a CPAP  . UTI (lower urinary tract infection)     in the 80s and 11/09  . Fatty liver 02/2009    per u/s  . DIABETES MELLITUS, TYPE II 02/26/2007  . HTN (hypertension)   . GERD (gastroesophageal reflux disease)    Past Surgical History  Procedure Laterality Date  . Septoplasty      w/ bilateral inferior turbinate reductions  . Tonsillectomy     Family History  Problem Relation Age of Onset  . Stroke Father   . Coronary artery disease Father   . Emphysema Mother     died  . Cancer Neg Hx     colon ,prostate   History  Substance Use Topics  . Smoking status: Former Games developer  . Smokeless tobacco: Never Used     Comment: 2001  . Alcohol Use: 0.0 oz/week     Comment: socially    Review of Systems  All other systems reviewed and are negative.    Allergies  Review of patient's allergies indicates no known allergies.  Home  Medications   Current Outpatient Rx  Name  Route  Sig  Dispense  Refill  . aspirin 325 MG tablet   Oral   Take 325 mg by mouth daily.           . clopidogrel (PLAVIX) 75 MG tablet      TAKE 1 TABLET DAILY   90 tablet   2   . ezetimibe-simvastatin (VYTORIN) 10-10 MG per tablet      TAKE 1 TABLET DAILY   90 tablet   2   . glipiZIDE (GLIPIZIDE XL) 5 MG 24 hr tablet   Oral   Take 1 tablet (5 mg total) by mouth 2 (two) times daily.   180 tablet   1   . lisinopril-hydrochlorothiazide (PRINZIDE,ZESTORETIC) 20-12.5 MG per tablet      TAKE 1 TABLET DAILY   90 tablet   2   . Multiple Vitamin (MULTIVITAMIN) tablet   Oral   Take 1 tablet by mouth daily.           . pantoprazole (PROTONIX) 40 MG tablet      TAKE 1 TABLET ONCE DAILY   90 tablet   2   . pioglitazone-metformin (ACTOPLUS MET) 15-850 MG per tablet      TAKE 1 TABLET 2 TIMES  DAILY WITH MEALS   180 tablet   2    BP 162/65  Pulse 51  Resp 18  Wt 268 lb 4.8 oz (121.7 kg)  BMI 37.94 kg/m2  SpO2 98%  Physical Exam General: Well-developed, well-nourished male in no acute distress; appearance consistent with age of record HENT: normocephalic; atraumatic Eyes: pupils equal, round and reactive to light; extraocular muscles intact Neck: supple Heart: Regularly irregular rhythm; bigeminy on monitor Lungs: clear to auscultation bilaterally Abdomen: soft; nondistended; nontender; no masses or hepatosplenomegaly; bowel sounds present Extremities: No deformity; full range of motion; pulses normal Neurologic: Awake, alert and oriented; motor function intact in all extremities and symmetric; no facial droop Skin: Warm and dry Psychiatric: Normal mood and affect  ED Course  Procedures (including critical care time)  MDM   Nursing notes and vitals signs, including pulse oximetry, reviewed.  Summary of this visit's results, reviewed by myself:  Labs:  Results for orders placed during the hospital encounter  of 07/14/13 (from the past 24 hour(s))  CBC     Status: Abnormal   Collection Time    07/14/13  6:59 AM      Result Value Range   WBC 5.1  4.0 - 10.5 K/uL   RBC 4.09 (*) 4.22 - 5.81 MIL/uL   Hemoglobin 13.1  13.0 - 17.0 g/dL   HCT 16.1 (*) 09.6 - 04.5 %   MCV 94.4  78.0 - 100.0 fL   MCH 32.0  26.0 - 34.0 pg   MCHC 33.9  30.0 - 36.0 g/dL   RDW 40.9  81.1 - 91.4 %   Platelets 195  150 - 400 K/uL  COMPREHENSIVE METABOLIC PANEL     Status: Abnormal   Collection Time    07/14/13  6:59 AM      Result Value Range   Sodium 137  135 - 145 mEq/L   Potassium 3.8  3.5 - 5.1 mEq/L   Chloride 103  96 - 112 mEq/L   CO2 25  19 - 32 mEq/L   Glucose, Bld 107 (*) 70 - 99 mg/dL   BUN 23  6 - 23 mg/dL   Creatinine, Ser 7.82  0.50 - 1.35 mg/dL   Calcium 9.7  8.4 - 95.6 mg/dL   Total Protein 7.0  6.0 - 8.3 g/dL   Albumin 4.3  3.5 - 5.2 g/dL   AST 27  0 - 37 U/L   ALT 19  0 - 53 U/L   Alkaline Phosphatase 63  39 - 117 U/L   Total Bilirubin 0.3  0.3 - 1.2 mg/dL   GFR calc non Af Amer 78 (*) >90 mL/min   GFR calc Af Amer 90 (*) >90 mL/min  TROPONIN I     Status: None   Collection Time    07/14/13  6:59 AM      Result Value Range   Troponin I <0.30  <0.30 ng/mL    EKG Interpretation:  Date & Time: 07/14/2013 6:56 AM  Rate: 102  Rhythm: Tachycardia in a pattern of ventricular bigeminy  QRS Axis: normal  Intervals: normal  ST/T Wave abnormalities: normal  Conduction Disutrbances:none  Narrative Interpretation:   Old EKG Reviewed: Previously NSR with left axis deviation  7:06 AM Patient given aspirin 125 mg and sublingual nitroglycerin started. Dr. Judd Lien will follow patient and make disposition.    Hanley Seamen, MD 07/14/13 2130  Hanley Seamen, MD 07/14/13 [phone]

## 2013-07-14 NOTE — H&P (Signed)
Admit date: 07/14/2013 Primary Physician  Willow Ora, MD Primary Cardiologist  Dr. Mayford Knife  CC: Chest pain  HPI: 62 year old male with previous coronary artery disease, multiple PCI last in 2001, with diabetes, hypertension, hyperlipidemia obstructive sleep apnea and GERD who earlier this morning while taking a shower began to experience left upper chest burning with shortness of breath and moderate diaphoresis. This was concerning to him and reminded him of his previous angina. He ended up going to the emergency department locally where he was given nitroglycerin which helped him with his discomfort. He has not had any cardiac catheterization or stress test in the recent past. Currently when talking with him, he is experiencing some minor GERD-like discomfort but it is slightly unsettling to him.  First troponin was normal. EKG demonstrates ventricular bigeminy which is new for him. Underlying, native QRS complexes are normal with no ST segment changes. He was transferred to [facility] hospital for further observation. Heparin was not started.    PMH:   Past Medical History  Diagnosis Date  . Syncope 01/2009    while in [place]; CT's ok, EEG neg, had a cath in [place] w/ 80% lesion (not stented), had CP/indgestion, was re-eval by Dr.Turner, [place]ath in [place] had 2 stents on 4/10, was then refered to EP Dr.Allred   . CAD (coronary artery disease)     dr Mayford Knife, [place] 2001 (stents x 2),   . OSA (obstructive sleep apnea)     can't tol. a CPAP  . UTI (lower urinary tract infection)     in the 80s and 11/09  . Fatty liver 02/2009    per u/s  . DIABETES MELLITUS, TYPE II 02/26/2007  . HTN (hypertension)   . GERD (gastroesophageal reflux disease)     PSH:   Past Surgical History  Procedure Laterality Date  . Septoplasty      w/ bilateral inferior turbinate reductions  . Tonsillectomy     Allergies:  Review of patient's allergies indicates no known allergies. Prior to Admit Meds:   Prior to  Admission medications   Medication Sig Start Date End Date Taking? Authorizing Provider  aspirin 325 MG tablet Take 325 mg by mouth daily.      Historical Provider, MD  clopidogrel (PLAVIX) 75 MG tablet TAKE 1 TABLET DAILY 11/30/12   Wanda Plump, MD  ezetimibe-simvastatin (VYTORIN) 10-10 MG per tablet TAKE 1 TABLET DAILY 11/30/12   Wanda Plump, MD  glipiZIDE (GLIPIZIDE XL) 5 MG 24 hr tablet Take 1 tablet (5 mg total) by mouth 2 (two) times daily. 07/05/13   Wanda Plump, MD  lisinopril-hydrochlorothiazide [facility]) 20-12.5 MG per tablet TAKE 1 TABLET DAILY 11/30/12   Wanda Plump, MD  Multiple Vitamin (MULTIVITAMIN) tablet Take 1 tablet by mouth daily.      Historical Provider, MD  pantoprazole (PROTONIX) 40 MG tablet TAKE 1 TABLET ONCE DAILY 11/30/12   Wanda Plump, MD  pioglitazone-metformin (ACTOPLUS MET) [phone] MG per tablet TAKE 1 TABLET 2 TIMES DAILY WITH MEALS 11/30/12   Wanda Plump, MD   Fam HX:    Family History  Problem Relation Age of Onset  . Stroke Father   . Coronary artery disease Father   . Emphysema Mother     died  . Cancer Neg Hx     colon ,prostate   Social HX:    History   Social History  . Marital Status: Married    Spouse Name: N/A  Number of Children: 2  . Years of Education: N/A   Occupational History  . Vice Biomedical engineer at [employer]    Social History Main Topics  . Smoking status: Former Games developer  . Smokeless tobacco: Never Used     Comment: 2001  . Alcohol Use: 0.0 oz/week     Comment: socially  . Drug Use: No  . Sexual Activity: Not on file   Other Topics Concern  . Not on file   Social History Narrative   Lives in [place], [place]                 ROS:  He denies any recent fevers, chills, bleeding, syncope, stroke like symptoms, orthopnea, PND, rash All 11 ROS were addressed and are negative except what is stated in the HPI   Physical Exam: Blood pressure 153/96, pulse 43, temperature 98.1 F (36.7 C),  temperature source Oral, resp. rate 15, weight 268 lb 4.8 oz (121.7 kg), SpO2 100.00%.   General: Well developed, well nourished, in no acute distress Head: Eyes PERRLA, No xanthomas.   Normal cephalic and atramatic  Lungs:  Clear bilaterally to auscultation and percussion. Normal respiratory effort. No wheezes, no rales. Heart:  HRRR S1 S2 Pulses are 2+ & equal. No murmurs, rubs or gallops.             No carotid bruit. No JVD.  No abdominal bruits. Abdomen: Bowel sounds are positive, abdomen soft and non-tender without masses  No hepatosplenomegaly. Overweight Msk:  Back normal, normal gait. Normal strength and tone for age. Extremities:  No clubbing, cyanosis or edema.  DP +1 Neuro: Alert and oriented X 3, non-focal, MAE x 4 GU: Deferred Rectal: Deferred Psych:  Good affect, responds appropriately         Labs:   Lab Results  Component Value Date   WBC 5.1 07/14/2013   HGB 13.1 07/14/2013   HCT 38.6* 07/14/2013   MCV 94.4 07/14/2013   PLT 195 07/14/2013    Recent Labs Lab 07/14/13 0659  NA 137  K 3.8  CL 103  CO2 25  BUN 23  CREATININE 1.00  CALCIUM 9.7  PROT 7.0  BILITOT 0.3  ALKPHOS 63  ALT 19  AST 27  GLUCOSE 107*    Recent Labs  07/14/13 0659  TROPONINI <0.30   Lab Results  Component Value Date   CHOL 124 11/30/2012   HDL 47.70 11/30/2012   LDLCALC 61 11/30/2012   TRIG 77.0 11/30/2012   No results found for this basename: DDIMER     Radiology:  Dg Chest Portable 1 View  07/14/2013   CLINICAL DATA:  Chest tightness, shortness of breath, diaphoresis  EXAM: PORTABLE CHEST - 1 VIEW  COMPARISON:  01/27/2009  FINDINGS: Lungs are clear. No pleural effusion or pneumothorax.  Cardiomegaly.  IMPRESSION: No evidence of acute cardiopulmonary disease.   Electronically Signed   By: Charline Bills M.D.   On: 07/14/2013 07:18   Personally viewed.   EKG:  As described above Personally viewed.  ASSESSMENT/PLAN:   62 year old with unstable angina-like symptoms,  coronary artery disease status post PCI, diabetes, hypertension, hyperlipidemia, obesity, obstructive sleep apnea.  -We will continue to observe and check multiple troponin. -If chest pain becomes more unsettling or does not resolve after GI cocktail, consider cardiac catheterization. -On exam, currently does not appear to be in a ventricular bigeminy pattern. -Continue with statin, ACE inhibitor, Plavix, aspirin. I will add low dose beta blocker. -  Continue to monitor his diabetes. -Lovenox DVT prophylaxis for now. -Protonix for GI prophylaxis.  Donato Schultz, MD  07/14/2013  12:29 PM

## 2013-07-14 NOTE — Progress Notes (Signed)
Pt experiencing mid chest burning. MD Skains notified GI cocktail given at 1015. Pt still having 4/10 burning. Cardiology prn maalox given at 1155. Pt states he feels much better. Pt is still NPO for possible intervention. CBG in the 70's. Will continue to monitor.

## 2013-07-14 NOTE — Progress Notes (Signed)
Utilization review completed.  

## 2013-07-14 NOTE — ED Notes (Signed)
Pt sts sudden onset of chest tightness with SOB and diaphoresis that began 30 minutes prior to arrival.

## 2013-07-14 NOTE — Progress Notes (Signed)
GI cocktail helped with symptoms.  Will place on cardiac diet. No cath this afternoon.  Will continue to observe.  Will likely be able to set up for outpatient stress test. NPO past midnight in case Dr. Mayford Knife wishes to perform further testing in house.

## 2013-07-15 ENCOUNTER — Other Ambulatory Visit: Payer: Self-pay | Admitting: Physician Assistant

## 2013-07-15 ENCOUNTER — Telehealth: Payer: Self-pay | Admitting: Cardiology

## 2013-07-15 DIAGNOSIS — K824 Cholesterolosis of gallbladder: Secondary | ICD-10-CM

## 2013-07-15 DIAGNOSIS — I10 Essential (primary) hypertension: Secondary | ICD-10-CM

## 2013-07-15 DIAGNOSIS — R079 Chest pain, unspecified: Secondary | ICD-10-CM

## 2013-07-15 DIAGNOSIS — K219 Gastro-esophageal reflux disease without esophagitis: Secondary | ICD-10-CM

## 2013-07-15 LAB — LIPID PANEL
Cholesterol: 107 mg/dL (ref 0–200)
LDL Cholesterol: 46 mg/dL (ref 0–99)
Total CHOL/HDL Ratio: 2.3 RATIO
Triglycerides: 68 mg/dL (ref <150)

## 2013-07-15 LAB — BASIC METABOLIC PANEL
BUN: 17 mg/dL (ref 6–23)
CO2: 25 mEq/L (ref 19–32)
Calcium: 8.6 mg/dL (ref 8.4–10.5)
Chloride: 102 mEq/L (ref 96–112)
GFR calc non Af Amer: >90 mL/min (ref >90)
Glucose, Bld: 98 mg/dL (ref 70–99)
Sodium: 137 mEq/L (ref 135–145)

## 2013-07-15 LAB — CBC
HCT: 36.8 % — ABNORMAL LOW (ref 39.0–52.0)
Hemoglobin: 12.4 g/dL — ABNORMAL LOW (ref 13.0–17.0)
MCH: 31.5 pg (ref 26.0–34.0)
MCHC: 33.7 g/dL (ref 30.0–36.0)
RBC: 3.94 MIL/uL — ABNORMAL LOW (ref 4.22–5.81)
WBC: 4 10*3/uL (ref 4.0–10.5)

## 2013-07-15 LAB — GLUCOSE, CAPILLARY: Glucose-Capillary: 104 mg/dL — ABNORMAL HIGH (ref 70–99)

## 2013-07-15 MED ORDER — ASPIRIN 81 MG PO TBEC: 81.0000 mg | DELAYED_RELEASE_TABLET | Freq: Every day | ORAL | Status: DC

## 2013-07-15 NOTE — Discharge Summary (Signed)
Patient ID: Daniel Cruz MRN: [id number] DOB/AGE: 01/31/1951 62 y.o.  Admit date: 07/14/2013 Discharge date: 07/15/2013  Primary Discharge Diagnosis Noncardiac chest pain Secondary Discharge Diagnosis  CAD with MI and PCI in 2010 of RCA/LAD  OSA intolerant to CPAP  UTI  Fatty Liver  Type II DM  HTN  GERD  Syncope  Significant Diagnostic Studies: None  Consults: None  Hospital Course: 62 year old male with previous coronary artery disease, multiple PCI last in 2010 to the LAD/RCA,  diabetes, hypertension, hyperlipidemia, obstructive sleep apnea and GERD who on the  morning of admission while taking a shower, began to experience left upper chest burning with shortness of breath and moderate diaphoresis. This was concerning to him and reminded him of his previous angina. He ended up going to the emergency department locally where he was given nitroglycerin which helped him with his discomfort. He has not had any cardiac catheterization or stress test in the recent past. Currently when talking with him, he is experiencing some minor GERD-like discomfort but it is slightly unsettling to him.  First troponin was normal. EKG demonstrates ventricular bigeminy which is new for him. Underlying, native QRS complexes are normal with no ST segment changes. He was transferred to [facility] hospital for further observation. Heparin was not started. He has not had any further CP.  He ruled out for MI by serial cardiac enzymes.  Today he is ambulating well without any chest pain or SOB.  He was discharged to home in stable condition.  He will be set up for outpatient nuclear stress test.      Discharge Exam: Blood pressure 118/65, pulse 57, temperature 98.4 F (36.9 C), temperature source Oral, resp. rate 18, weight 261 lb 14.5 oz (118.8 kg), SpO2 95.00%.   General appearance: alert Resp: clear to auscultation bilaterally Cardio: regular rate and rhythm, S1, S2 normal, no murmur, click, rub or  gallop GI: soft, non-tender; bowel sounds normal; no masses,  no organomegaly Extremities: extremities normal, atraumatic, no cyanosis or edema Labs:   Lab Results  Component Value Date   WBC 4.0 07/15/2013   HGB 12.4* 07/15/2013   HCT 36.8* 07/15/2013   MCV 93.4 07/15/2013   PLT 193 07/15/2013    Recent Labs Lab 07/14/13 1410 07/15/13 0525  NA 137 137  K 4.0 4.0  CL 103 102  CO2 25 25  BUN 18 17  CREATININE 0.86 0.83  CALCIUM 9.0 8.6  PROT 6.5  --   BILITOT 0.3  --   ALKPHOS 55  --   ALT 18  --   AST 25  --   GLUCOSE 69* 98   Lab Results  Component Value Date   CKTOTAL 79 01/27/2009   CKMB 2.2 01/27/2009   TROPONINI <0.30 07/15/2013    Lab Results  Component Value Date   CHOL 107 07/15/2013   CHOL 124 11/30/2012   CHOL 103 05/15/2011   Lab Results  Component Value Date   HDL 47 07/15/2013   HDL 47.70 11/30/2012   HDL 30.86 05/15/2011   Lab Results  Component Value Date   LDLCALC 46 07/15/2013   LDLCALC 61 11/30/2012   LDLCALC 37 05/15/2011   Lab Results  Component Value Date   TRIG 68 07/15/2013   TRIG 77.0 11/30/2012   TRIG 66.0 05/15/2011   Lab Results  Component Value Date   CHOLHDL 2.3 07/15/2013   CHOLHDL 3 11/30/2012   CHOLHDL 2 05/15/2011   No results found for this basename:  LDLDIRECT      Radiology:  CLINICAL DATA: Chest tightness, shortness of breath, diaphoresis  EXAM:  PORTABLE CHEST - 1 VIEW  COMPARISON: 01/27/2009  FINDINGS:  Lungs are clear. No pleural effusion or pneumothorax.  Cardiomegaly.  IMPRESSION:  No evidence of acute cardiopulmonary disease.  Electronically Signed  By: Charline Bills M.D.  On: 07/14/2013 07:18  EKG:  NSR with inferior infarct (old) with no ST changes  FOLLOW UP PLANS AND APPOINTMENTS Discharge Orders   Future Appointments Provider Department Dept Phone   09/27/2013 8:00 AM Wanda Plump, MD [facility]  [place] [phone]   11/18/2013 8:45 AM Quintella Reichert, MD [facility]  [phone]   12/01/2013 8:30 AM Wanda Plump, MD [facility] at  [place] [phone]   Future Orders Complete By Expires   Diet - low sodium heart healthy  As directed    Discharge instructions  As directed    Comments:     Call if you have any reoccurrence of chest pain or SOB   Increase activity slowly  As directed        Medication List    STOP taking these medications       aspirin 325 MG tablet  Replaced by:  aspirin 81 MG EC tablet      TAKE these medications       aspirin 81 MG EC tablet  Take 1 tablet (81 mg total) by mouth daily.     clopidogrel 75 MG tablet  Commonly known as:  PLAVIX  TAKE 1 TABLET DAILY     ezetimibe-simvastatin 10-10 MG per tablet  Commonly known as:  VYTORIN  TAKE 1 TABLET DAILY     glipiZIDE 5 MG 24 hr tablet  Commonly known as:  GLIPIZIDE XL  Take 1 tablet (5 mg total) by mouth 2 (two) times daily.     lisinopril-hydrochlorothiazide 20-12.5 MG per tablet  Commonly known as:  PRINZIDE,ZESTORETIC  TAKE 1 TABLET DAILY     multivitamin tablet  Take 1 tablet by mouth daily.     pantoprazole 40 MG tablet  Commonly known as:  PROTONIX  TAKE 1 TABLET ONCE DAILY     pioglitazone-metformin 15-850 MG per tablet  Commonly known as:  ACTOPLUS MET  TAKE 1 TABLET 2 TIMES DAILY WITH MEALS         BRING ALL MEDICATIONS WITH YOU TO FOLLOW UP APPOINTMENTS  Time spent with patient to include physician time: 25 minutes Signed: Aleem Elza R 07/15/2013, 7:48 AM

## 2013-07-15 NOTE — Telephone Encounter (Signed)
TO Dr. Mayford Knife to advise will we do this for a pt?

## 2013-07-15 NOTE — Telephone Encounter (Signed)
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 Please call to discuss.

## 2013-07-15 NOTE — Telephone Encounter (Signed)
Please give patient prescription for Valium 5mg  disp# 1 tablet with no refills to take once he gets to stress test.  He must bring someone with him to drive him home.

## 2013-07-16 NOTE — Telephone Encounter (Signed)
LVM for pt to return call and let us know what pharmacy he would like [place] to send the rx in to.

## 2013-07-20 ENCOUNTER — Other Ambulatory Visit: Payer: Self-pay | Admitting: General Surgery

## 2013-07-20 NOTE — Telephone Encounter (Signed)
Pt stated he would pick up rx for Valium tomorrow when he sees Dr. Mayford Knife for his appt.

## 2013-07-21 ENCOUNTER — Encounter: Payer: Self-pay | Admitting: Cardiology

## 2013-07-21 ENCOUNTER — Telehealth: Payer: Self-pay | Admitting: General Surgery

## 2013-07-21 ENCOUNTER — Ambulatory Visit (INDEPENDENT_AMBULATORY_CARE_PROVIDER_SITE_OTHER): Payer: BC Managed Care – PPO | Admitting: Cardiology

## 2013-07-21 VITALS — BP 140/80 | HR 73 | Ht 72.0 in | Wt 270.0 lb

## 2013-07-21 DIAGNOSIS — R079 Chest pain, unspecified: Secondary | ICD-10-CM

## 2013-07-21 DIAGNOSIS — I10 Essential (primary) hypertension: Secondary | ICD-10-CM

## 2013-07-21 DIAGNOSIS — E785 Hyperlipidemia, unspecified: Secondary | ICD-10-CM

## 2013-07-21 DIAGNOSIS — I2510 Atherosclerotic heart disease of native coronary artery without angina pectoris: Secondary | ICD-10-CM

## 2013-07-21 LAB — CK TOTAL AND CKMB (NOT AT ARMC)
CK, MB: 2.7 ng/mL (ref 0.3–4.0)
Relative Index: 2.4 (ref 0.0–2.5)
Total CK: 114 U/L (ref 7–232)

## 2013-07-21 LAB — CK: Total CK: 97 U/L (ref 7–232)

## 2013-07-21 LAB — TROPONIN I: Troponin I: <0.3 ng/mL (ref <0.30)

## 2013-07-21 MED ORDER — PANTOPRAZOLE SODIUM 40 MG PO TBEC: 40.0000 mg | DELAYED_RELEASE_TABLET | Freq: Two times a day (BID) | ORAL | Status: DC

## 2013-07-21 MED ORDER — NITROGLYCERIN 0.4 MG SL SUBL: 0.4000 mg | SUBLINGUAL_TABLET | SUBLINGUAL | Status: DC | PRN

## 2013-07-21 NOTE — Telephone Encounter (Signed)
Message copied by Nita Sells on Wed Jul 21, 2013  1:50 PM ------      Message from: Armanda Magic R      Created: Wed Jul 21, 2013  1:40 PM       Please let patient know that labs were normal.  Continue current medical therapy. ------

## 2013-07-21 NOTE — Progress Notes (Signed)
[address] [place], [place]  [address] Phone: [phone] Fax:  [phone]  Date:  07/21/2013   ID:  Daniel Cruz, DOB 1950-10-20, MRN [id number]  PCP:  Willow Ora, MD  Cardiologist:  Armanda Magic, MD     History of Present Illness: Daniel Cruz is a 62 y.o. male with previous coronary artery disease, multiple PCI last in 2010 to the LAD/RCA, diabetes, hypertension, hyperlipidemia, obstructive sleep apnea and GERD whowas recently  Admitted with CP.   While taking a shower, he began to experience left upper chest burning with shortness of breath and moderate diaphoresis. This was concerning to him and reminded him of his previous angina. He ended up going to the emergency department locally where he was given nitroglycerin which helped him with his discomfort. He has not had any cardiac catheterization or stress test in the recent past.  He was transferred to [facility] hospital for further observation.  He did not have any further CP. He ruled out for MI by serial cardiac enzymes. He is set up for outpatient nuclear stress test in the near future.  He presents back today for followup.  He says that he has been having a lot of indigestion and has been taking a lot of antacids which does resolve the symptoms at the time.  He says that today he feels the best he has felt in a while.  His symptoms are worse during the day but if he lies flat to sleep his indigestion symptoms are terrible.  He says that it is a pressure feeling in the epigastrium that radiates up into his chest and he feels a gurgling sensation in his chest.  He has been having a lot of belching that temporarily relieves the discomfort.  He denies any frank SOB but sighs a lot.          Wt Readings from Last 3 Encounters:  07/21/13 270 lb (122.471 kg)  07/15/13 261 lb 14.5 oz (118.8 kg)  06/30/13 273 lb 12.8 oz (124.195 kg)     Past Medical History  Diagnosis Date  . Syncope 01/2009    while in [place];  CT's ok, EEG neg, had a cath in [place] w/ 80% lesion (not stented), had CP/indgestion, was re-eval by Dr.Ranulfo Kall, re[place]cath in [place] had 2 stents on 4/10, was then refered to EP Dr.Allred   . CAD (coronary artery disease)     dr Mayford Knife, [place] 2001 (stents x 2),   . OSA (obstructive sleep apnea)     can't tol. a CPAP  . UTI (lower urinary tract infection)     in the 80s and 11/09  . Fatty liver 02/2009    per u/s  . DIABETES MELLITUS, TYPE II 02/26/2007  . HTN (hypertension)   . GERD (gastroesophageal reflux disease)     Current Outpatient Prescriptions  Medication Sig Dispense Refill  . aspirin EC 81 MG EC tablet Take 1 tablet (81 mg total) by mouth daily.      . clopidogrel (PLAVIX) 75 MG tablet TAKE 1 TABLET DAILY  90 tablet  2  . ezetimibe-simvastatin (VYTORIN) 10-10 MG per tablet TAKE 1 TABLET DAILY  90 tablet  2  . glipiZIDE (GLIPIZIDE XL) 5 MG 24 hr tablet Take 1 tablet (5 mg total) by mouth 2 (two) times daily.  180 tablet  1  . lisinopril-hydrochlorothiazide (PRINZIDE,ZESTORETIC) 20-12.5 MG per tablet TAKE 1 TABLET DAILY  90 tablet  2  . Multiple Vitamin (MULTIVITAMIN) tablet  Take 1 tablet by mouth daily.        . pantoprazole (PROTONIX) 40 MG tablet TAKE 1 TABLET ONCE DAILY  90 tablet  2  . pioglitazone-metformin (ACTOPLUS MET) 15-850 MG per tablet TAKE 1 TABLET 2 TIMES DAILY WITH MEALS  180 tablet  2   No current facility-administered medications for this visit.    Allergies:   No Known Allergies  Social History:  The patient  reports that he has quit smoking. He has never used smokeless tobacco. He reports that he drinks alcohol. He reports that he does not use illicit drugs.   Family History:  The patient's family history includes Coronary artery disease in his father; Emphysema in his mother; Stroke in his father. There is no history of Cancer.   ROS:  Please see the history of present illness.      All other systems reviewed and negative.   PHYSICAL EXAM: VS:  BP 140/80   Ht 6' (1.829 m)  Wt 270 lb (122.471 kg)  BMI 36.61 kg/m2 Well nourished, well developed, in no acute distress HEENT: normal Neck: no JVD Cardiac:  normal S1, S2; RRR; no murmur Lungs:  clear to auscultation bilaterally, no wheezing, rhonchi or rales Abd: soft, nontender, no hepatomegaly Ext: no edema Skin: warm and dry Neuro:  CNs 2-12 intact, no focal abnormalities noted  EKG:  NSR with no ST changes  ASSESSMENT AND PLAN:  1. Atypical Chest pain which sounds more GI in nature.  It is much worse when lying down flat and is associated with a gurgling sensation in his chest and belching.   - Incresae Protonix 40mg  BID  - Nuclear stress test has been moved up to tomorrow in our office  - Gallbladder US  - A prescription for Valium 5mg  to be taken when he arrives in office tomorrow for study was given 2.  CAD  - continue ASA/Plavix 3.  HTN  - continue Lisinopril HCT 4.  Dyslipidemia  - continue Vytorin  followup with me in 1 week  Signed, Armanda Magic, MD 07/21/2013 9:20 AM

## 2013-07-21 NOTE — Patient Instructions (Signed)
Please go to the lab today after your visit.   Your physician recommends that you schedule a follow-up appointment in: A week after your stress test.   Dr. Mayford Knife would like you to have a Ultrasound of your gallbladder   Please start taking your Protonix Twice a day.

## 2013-07-21 NOTE — Telephone Encounter (Signed)
Pt is aware of lab results.

## 2013-07-22 ENCOUNTER — Encounter (HOSPITAL_COMMUNITY): Payer: Self-pay | Admitting: Radiology

## 2013-07-22 ENCOUNTER — Ambulatory Visit (HOSPITAL_COMMUNITY): Payer: BC Managed Care – PPO | Attending: Cardiology | Admitting: Radiology

## 2013-07-22 VITALS — BP 110/71 | HR 62 | Ht 72.0 in | Wt 268.0 lb

## 2013-07-22 DIAGNOSIS — Z8249 Family history of ischemic heart disease and other diseases of the circulatory system: Secondary | ICD-10-CM | POA: Insufficient documentation

## 2013-07-22 DIAGNOSIS — R079 Chest pain, unspecified: Secondary | ICD-10-CM

## 2013-07-22 DIAGNOSIS — R0989 Other specified symptoms and signs involving the circulatory and respiratory systems: Secondary | ICD-10-CM | POA: Insufficient documentation

## 2013-07-22 DIAGNOSIS — I252 Old myocardial infarction: Secondary | ICD-10-CM | POA: Insufficient documentation

## 2013-07-22 DIAGNOSIS — I2510 Atherosclerotic heart disease of native coronary artery without angina pectoris: Secondary | ICD-10-CM | POA: Insufficient documentation

## 2013-07-22 DIAGNOSIS — R0789 Other chest pain: Secondary | ICD-10-CM | POA: Insufficient documentation

## 2013-07-22 DIAGNOSIS — R0602 Shortness of breath: Secondary | ICD-10-CM

## 2013-07-22 DIAGNOSIS — E119 Type 2 diabetes mellitus without complications: Secondary | ICD-10-CM | POA: Insufficient documentation

## 2013-07-22 DIAGNOSIS — E785 Hyperlipidemia, unspecified: Secondary | ICD-10-CM | POA: Insufficient documentation

## 2013-07-22 DIAGNOSIS — Z9861 Coronary angioplasty status: Secondary | ICD-10-CM | POA: Insufficient documentation

## 2013-07-22 DIAGNOSIS — R0609 Other forms of dyspnea: Secondary | ICD-10-CM | POA: Insufficient documentation

## 2013-07-22 DIAGNOSIS — Z87891 Personal history of nicotine dependence: Secondary | ICD-10-CM | POA: Insufficient documentation

## 2013-07-22 DIAGNOSIS — I10 Essential (primary) hypertension: Secondary | ICD-10-CM | POA: Insufficient documentation

## 2013-07-22 MED ORDER — TECHNETIUM TC 99M SESTAMIBI GENERIC - CARDIOLITE
33.0000 | Freq: Once | INTRAVENOUS | Status: AC | PRN
  Administered 2013-07-22: 33 via INTRAVENOUS

## 2013-07-22 MED ORDER — TECHNETIUM TC 99M SESTAMIBI GENERIC - CARDIOLITE
11.0000 | Freq: Once | INTRAVENOUS | Status: AC | PRN
  Administered 2013-07-22: 11 via INTRAVENOUS

## 2013-07-22 NOTE — Progress Notes (Signed)
[facility] SITE 3 NUCLEAR MED [address] [place], [place] [address] [phone]    Cardiology Nuclear Med Study  Daniel Cruz is a 62 y.o. male     MRN : [id number]     DOB: [birth year]  Procedure Date: 07/22/2013  Nuclear Med Background Indication for Stress Test:  Evaluation for Ischemia, PTCA/Stent Patency History:  CAD;MI;Multiple stents;04-24-07 Myocardial Perfusion Study-Can not rule out ischemia, EF=58% Cardiac Risk Factors: Family History - CAD, History of Smoking, Hypertension, Lipids and NIDDM  Symptoms:  Chest Pain (last date of chest discomfort 1 week ago); DOE   Nuclear Pre-Procedure Caffeine/Decaff Intake:  None >12 hours NPO After: 7:30pm   Lungs:  clear O2 Sat: 99% on room air. IV 0.9% NS with Angio Cath:  22g  IV Site: R Antecubital  IV Started by:  Rickard Patience  Chest Size (in):  50 Cup Size: n/a  Height: 6' (1.829 m)  Weight:  268 lb (121.564 kg)  BMI:  Body mass index is 36.34 kg/(m^2). Tech Comments:  Held Glipizide. Took Actoplus    Nuclear Med Study 1 or 2 day study: 1 day  Stress Test Type:  Stress  Reading MD: Gevena Cotton  Order Authorizing Provider:  Gloris Manchester Turner,MD  Resting Radionuclide: Technetium 85m Sestamibi  Resting Radionuclide Dose: 11.0 mCi   Stress Radionuclide:  Technetium 25m Sestamibi  Stress Radionuclide Dose: 33.0 mCi           Stress Protocol Rest HR: 62 Stress HR: 141  Rest BP: 110/71 Stress BP: 151/71  Exercise Time (min): 6:16 METS: 7.20   Predicted Max HR: 156 bpm % Max HR: 90.38 bpm Rate Pressure Product: 21308   Dose of Adenosine (mg):  n/a Dose of Lexiscan: n/a mg  Dose of Atropine (mg): n/a Dose of Dobutamine: n/a mcg/kg/min (at max HR)  Stress Test Technologist: Irean Hong, RN  Nuclear Technologist:  Doyne Keel, CNMT     Rest Procedure:  Myocardial perfusion imaging was performed at rest 45 minutes following the intravenous administration of Technetium 61m Sestamibi. Rest ECG:  NSR with frequent PVC's  Stress Procedure:  The patient exercised on the treadmill utilizing the Bruce Protocol for 6:16 minutes, RPE=15. The patient stopped due to DOE and denied any chest pain.  Technetium 67m Sestamibi was injected at peak exercise and myocardial perfusion imaging was performed after a brief delay. Stress ECG: No significant change from baseline ECG  QPS Raw Data Images:  Normal; no motion artifact; normal heart/lung ratio. Stress Images:  There is reduced uptake in the inferior wall that appears fixed. Rest Images:  There is decreased uptake in the inferior wall. Subtraction (SDS):  Normal Transient Ischemic Dilatation (Normal <1.22):  0.81 Lung/Heart Ratio (Normal <0.45):  0.34  Quantitative Gated Spect Images QGS EDV:  n/a QGS ESV:  n/a  Impression Exercise Capacity:  Good exercise capacity. BP Response:  Normal blood pressure response. Clinical Symptoms:  No significant symptoms noted. ECG Impression:  No significant ST segment change suggestive of ischemia. Comparison with Prior Nuclear Study: no previous study available to compare  Overall Impression:  Low risk stress nuclear study There is a fixed defect in the inferior wall most consistent with diaphragmatic attenuation but cannot rule out infarct since gated imates were not performed due to PVC's.  LV Ejection Fraction: Study not gated.  LV Wall Motion:  Study not gated   Signed, Armanda Magic, MD

## 2013-07-26 ENCOUNTER — Ambulatory Visit
Admission: RE | Admit: 2013-07-26 | Discharge: 2013-07-26 | Disposition: A | Discharge status: Home / Self Care | Payer: BC Managed Care – PPO | Source: Ambulatory Visit | Attending: Cardiology | Admitting: Cardiology

## 2013-07-26 DIAGNOSIS — R079 Chest pain, unspecified: Secondary | ICD-10-CM

## 2013-07-27 NOTE — Telephone Encounter (Signed)
Message copied by Nita Sells on Tue Jul 27, 2013  4:27 PM ------      Message from: Quintella Reichert      Created: Mon Jul 26, 2013 10:51 AM       Please let patient know that his abdominal US showed a gallbladder polyp and possible fatty liver.  I would like him to be evaluated by GI.  Please refer to [facility] for evaluation. ALso please find out how he is feeling ------

## 2013-07-27 NOTE — Telephone Encounter (Signed)
Pt is aware of the results. Pt is feeling ok he stated. He is not having any real problems with reflux since we up'ed his medication.

## 2013-07-29 ENCOUNTER — Encounter: Payer: Self-pay | Admitting: Cardiology

## 2013-07-29 ENCOUNTER — Ambulatory Visit (INDEPENDENT_AMBULATORY_CARE_PROVIDER_SITE_OTHER): Payer: BC Managed Care – PPO | Admitting: Cardiology

## 2013-07-29 VITALS — BP 122/68 | HR 72 | Ht 72.0 in | Wt 269.0 lb

## 2013-07-29 DIAGNOSIS — I2510 Atherosclerotic heart disease of native coronary artery without angina pectoris: Secondary | ICD-10-CM

## 2013-07-29 DIAGNOSIS — K219 Gastro-esophageal reflux disease without esophagitis: Secondary | ICD-10-CM

## 2013-07-29 DIAGNOSIS — I10 Essential (primary) hypertension: Secondary | ICD-10-CM

## 2013-07-29 DIAGNOSIS — E785 Hyperlipidemia, unspecified: Secondary | ICD-10-CM

## 2013-07-29 NOTE — Patient Instructions (Signed)
I spoke with [facility] and we have you set up for 08/12/13 at 8:00 am. If you need to reschedule you can call them  Your physician recommends that you continue on your current medications as directed. Please refer to the Current Medication list given to you today.  Your physician recommends that you schedule a follow-up appointment in: 3 Months with Dr. Mayford Knife

## 2013-07-29 NOTE — Progress Notes (Signed)
[address] [place], [place]  [address] Phone: [phone] Fax:  [phone]  Date:  07/29/2013   ID:  Daniel Cruz, DOB 24-Mar-1951, MRN [id number]  PCP:  Willow Ora, MD  Cardiologist:  Armanda Magic, MD     History of Present Illness:Daniel Cruz is a 62 y.o. male with previous coronary artery disease, multiple PCI last in 2010 to the LAD/RCA, diabetes, hypertension, hyperlipidemia, obstructive sleep apnea and GERD who was recently admitted with CP. While taking a shower, he began to experience left upper chest burning with shortness of breath and moderate diaphoresis. This was concerning to him and reminded him of his previous angina. He ended up going to the emergency department locally where he was given nitroglycerin which helped him with his discomfort. He has not had any cardiac catheterization or stress test in the recent past. He was transferred to [facility] hospital for further observation. He did not have any further CP. He ruled out for MI by serial cardiac enzymes. I saw him back recently for followup. He was  having a lot of indigestion and had been taking a lot of antacids which did resolve the symptoms at the time. His symptoms were worse during the day but if he layed flat to sleep his indigestion symptoms were terrible. He said that was a pressure feeling in the epigastrium that radiated up into his chest with a gurgling sensation in his chest. He had been having a lot of belching that temporarily relieved the discomfort. He underwent nuclear stress test which showed a fixed defect in the inferior wall most consistent with diaphragmatic attenuation but could not out infarct since gated imates were not performed due to PVC's.  He presents back today for followup.  He had an abdominal US done which showed fatty liver and a gallbladder polyp.  He is doing much better.  He says that he is not having any chest discomfort when he exerts himself.  He still has some mild  indigestion but he says it only occurs at night and he no longer has to sleep upright. He is set up to see a Solicitor in the near future.  He rarely has DOE only when going up the steps.   Wt Readings from Last 3 Encounters:  07/29/13 269 lb (122.018 kg)  07/22/13 268 lb (121.564 kg)  07/21/13 270 lb (122.471 kg)     Past Medical History  Diagnosis Date  . Syncope 01/2009    while in [place]; CT's ok, EEG neg, had a cath in [place] w/ 80% lesion (not stented), had CP/indgestion, was re-eval by Dr.Shin Lamour, re[place]cath in [place] had 2 stents on 4/10, was then refered to EP Dr.Allred   . OSA (obstructive sleep apnea)     can't tol. a CPAP  . UTI (lower urinary tract infection)     in the 80s and 11/09  . Fatty liver 02/2009    per u/s  . DIABETES MELLITUS, TYPE II 02/26/2007  . HTN (hypertension)   . GERD (gastroesophageal reflux disease)   . CAD (coronary artery disease)     dr Mayford Knife, [place] 2001 (stents x 2),     Current Outpatient Prescriptions  Medication Sig Dispense Refill  . aspirin EC 81 MG EC tablet Take 1 tablet (81 mg total) by mouth daily.      . clopidogrel (PLAVIX) 75 MG tablet TAKE 1 TABLET DAILY  90 tablet  2  . ezetimibe-simvastatin (VYTORIN) 10-10 MG per tablet TAKE  1 TABLET DAILY  90 tablet  2  . glipiZIDE (GLIPIZIDE XL) 5 MG 24 hr tablet Take 1 tablet (5 mg total) by mouth 2 (two) times daily.  180 tablet  1  . lisinopril-hydrochlorothiazide (PRINZIDE,ZESTORETIC) 20-12.5 MG per tablet TAKE 1 TABLET DAILY  90 tablet  2  . Multiple Vitamin (MULTIVITAMIN) tablet Take 1 tablet by mouth daily.        . nitroGLYCERIN (NITROSTAT) 0.4 MG SL tablet Place 1 tablet (0.4 mg total) under the tongue every 5 (five) minutes as needed for chest pain.  90 tablet  3  . pantoprazole (PROTONIX) 40 MG tablet Take 1 tablet (40 mg total) by mouth 2 (two) times daily. TAKE 1 TABLET ONCE DAILY  90 tablet  2  . pioglitazone-metformin (ACTOPLUS MET) 15-850 MG per tablet TAKE 1 TABLET 2  TIMES DAILY WITH MEALS  180 tablet  2   No current facility-administered medications for this visit.    Allergies:   No Known Allergies  Social History:  The patient  reports that he has quit smoking. He has never used smokeless tobacco. He reports that he drinks alcohol. He reports that he does not use illicit drugs.   Family History:  The patient's family history includes Coronary artery disease in his father; Emphysema in his mother; Stroke in his father. There is no history of Cancer.   ROS:  Please see the history of present illness.       All other systems reviewed and negative.   PHYSICAL EXAM: VS:  BP 122/68  Pulse 72  Ht 6' (1.829 m)  Wt 269 lb (122.018 kg)  BMI 36.48 kg/m2 Well nourished, well developed, in no acute distress HEENT: normal Neck: no JVD Cardiac:  normal S1, S2; RRR; no murmur Lungs:  clear to auscultation bilaterally, no wheezing, rhonchi or rales Abd: soft, nontender, no hepatomegaly Ext: no edema Skin: warm and dry Neuro:  CNs 2-12 intact, no focal abnormalities noted       ASSESSMENT AND PLAN:  1. Indigestion that has significantly improved after increasing protonix.    - continue Protonix  - He is set up to see a Gastroenterologist 2. ASCAD with no exertional chest pain  - continue ASA/Plavix 3. HTN  - continue Lisinopril HCT 4. Dyslipidemia  - continue Vytorin 5. Gallbladder polyp 6. Fatty liver  Followup with me in 3 months Instructed to call if he develops and anginal symptoms.  Signed, Armanda Magic, MD 07/29/2013 1:47 PM

## 2013-08-03 ENCOUNTER — Encounter (HOSPITAL_COMMUNITY): Payer: BC Managed Care – PPO

## 2013-08-04 ENCOUNTER — Encounter: Payer: BC Managed Care – PPO | Admitting: Cardiology

## 2013-08-05 ENCOUNTER — Encounter: Payer: BC Managed Care – PPO | Admitting: Cardiology

## 2013-08-30 ENCOUNTER — Other Ambulatory Visit: Payer: Self-pay | Admitting: Internal Medicine

## 2013-09-13 ENCOUNTER — Encounter: Payer: Self-pay | Admitting: Physician Assistant

## 2013-09-13 ENCOUNTER — Ambulatory Visit (INDEPENDENT_AMBULATORY_CARE_PROVIDER_SITE_OTHER): Payer: BC Managed Care – PPO | Admitting: Physician Assistant

## 2013-09-13 VITALS — BP 128/74 | HR 95 | Temp 98.5°F | Ht 72.0 in | Wt 274.1 lb

## 2013-09-13 DIAGNOSIS — J069 Acute upper respiratory infection, unspecified: Secondary | ICD-10-CM | POA: Insufficient documentation

## 2013-09-13 MED ORDER — HYDROCOD POLST-CHLORPHEN POLST 10-8 MG/5ML PO LQCR: 5.0000 mL | Freq: Two times a day (BID) | ORAL | Status: DC | PRN

## 2013-09-13 NOTE — Progress Notes (Signed)
Pre visit review using our clinic review tool, if applicable. No additional management support is needed unless otherwise documented below in the visit note. 

## 2013-09-13 NOTE — Assessment & Plan Note (Signed)
Supportive care. Rest. Increase fluid intake. Saline nasal spray. Rx Tussionex for cough. Please humidifier in bedroom. Please call or return to clinic if symptoms are not improving

## 2013-09-13 NOTE — Patient Instructions (Addendum)
Please increase fluid intake.  Tussionex every 12 hours as needed for cough.  May make you drowsy.  Saline nasal spray. Humidifier in bedroom.  Rest.  Can take a Claritin daily as well.   DASH Diet The DASH diet stands for "Dietary Approaches to Stop Hypertension." It is a healthy eating plan that has been shown to reduce high blood pressure (hypertension) in as little as 14 days, while also possibly providing other significant health benefits. These other health benefits include reducing the risk of breast cancer after menopause and reducing the risk of type 2 diabetes, heart disease, colon cancer, and stroke. Health benefits also include weight loss and slowing kidney failure in patients with chronic kidney disease.  DIET GUIDELINES  Limit salt (sodium). Your diet should contain less than 1500 mg of sodium daily.  Limit refined or processed carbohydrates. Your diet should include mostly whole grains. Desserts and added sugars should be used sparingly.  Include small amounts of heart-healthy fats. These types of fats include nuts, oils, and tub margarine. Limit saturated and trans fats. These fats have been shown to be harmful in the body. CHOOSING FOODS  The following food groups are based on a 2000 calorie diet. See your Registered Dietitian for individual calorie needs. Grains and Grain Products (6 to 8 servings daily)  Eat More Often: Whole-wheat bread, brown rice, whole-grain or wheat pasta, quinoa, popcorn without added fat or salt (air popped).  Eat Less Often: White bread, white pasta, white rice, cornbread. Vegetables (4 to 5 servings daily)  Eat More Often: Fresh, frozen, and canned vegetables. Vegetables may be raw, steamed, roasted, or grilled with a minimal amount of fat.  Eat Less Often/Avoid: Creamed or fried vegetables. Vegetables in a cheese sauce. Fruit (4 to 5 servings daily)  Eat More Often: All fresh, canned (in natural juice), or frozen fruits. Dried fruits without  added sugar. One hundred percent fruit juice ( cup [237 mL] daily).  Eat Less Often: Dried fruits with added sugar. Canned fruit in light or heavy syrup. [employer], Fish, and Poultry (2 servings or less daily. One serving is 3 to 4 oz [85-114 g]).  Eat More Often: Ninety percent or leaner ground beef, tenderloin, sirloin. Round cuts of beef, chicken breast, [place] breast. All fish. Grill, bake, or broil your meat. Nothing should be fried.  Eat Less Often/Avoid: Fatty cuts of meat, [place], or chicken leg, thigh, or wing. Fried cuts of meat or fish. Dairy (2 to 3 servings)  Eat More Often: Low-fat or fat-free milk, low-fat plain or light yogurt, reduced-fat or part-skim cheese.  Eat Less Often/Avoid: Milk (whole, 2%).Whole milk yogurt. Full-fat cheeses. Nuts, Seeds, and Legumes (4 to 5 servings per week)  Eat More Often: All without added salt.  Eat Less Often/Avoid: Salted nuts and seeds, canned beans with added salt. Fats and Sweets (limited)  Eat More Often: Vegetable oils, tub margarines without trans fats, sugar-free gelatin. Mayonnaise and salad dressings.  Eat Less Often/Avoid: Coconut oils, palm oils, butter, stick margarine, cream, half and half, cookies, candy, pie. FOR MORE INFORMATION The Dash Diet Eating Plan: www.dashdiet.org Document Released: 09/12/2011 Document Revised: 12/16/2011 Document Reviewed: 09/12/2011 [facility] Patient Information 2014 [place], [place].

## 2013-09-13 NOTE — Progress Notes (Signed)
Patient ID: Daniel Cruz, male   DOB: 30-Dec-1950, 62 y.o.   MRN: [id number]  Patient presents to clinic today complaining of 3 days of a nonproductive cough, runny nose and scratchy throat.  Patient states symptoms started the morning after he was outside in the cold.  Patient denies sinus pressure or sinus pain.  Denies chest pain, wheezing or shortness of breath.  Patient denies seasonal allergies or asthma.  Patient has been doing well for the most part. His main concern is that his cough is persistent and keeping him awake at night.   Denies fever, chills, malaise or fatigue. Denies recent travel or sick contact.  Patient's BP is elevated at 158/68. Patient has history of hypertension, and endorses taking his medication this morning.  Patient denies symptoms. Pressure recheck at the end of the visit reveals blood pressure of 128/74.     Past Medical History  Diagnosis Date  . Syncope 01/2009    while in [place]; CT's ok, EEG neg, had a cath in [place] w/ 80% lesion (not stented), had CP/indgestion, was re-eval by Dr.Turner, re-cath in [place] had 2 stents on 4/10, was then refered to EP Dr.Allred   . OSA (obstructive sleep apnea)     can't tol. a CPAP  . UTI (lower urinary tract infection)     in the 80s and 11/09  . Fatty liver 02/2009    per u/s  . DIABETES MELLITUS, TYPE II 02/26/2007  . HTN (hypertension)   . GERD (gastroesophageal reflux disease)   . CAD (coronary artery disease)     dr Mayford Knife, [place] 2001 (stents x 2),     Current Outpatient Prescriptions on File Prior to Visit  Medication Sig Dispense Refill  . clopidogrel (PLAVIX) 75 MG tablet TAKE 1 TABLET DAILY  90 tablet  2  . ezetimibe-simvastatin (VYTORIN) 10-10 MG per tablet TAKE 1 TABLET DAILY  90 tablet  2  . glipiZIDE (GLIPIZIDE XL) 5 MG 24 hr tablet Take 1 tablet (5 mg total) by mouth 2 (two) times daily.  180 tablet  1  . lisinopril-hydrochlorothiazide (PRINZIDE,ZESTORETIC) 20-12.5 MG per tablet TAKE 1 TABLET DAILY   90 tablet  2  . Multiple Vitamin (MULTIVITAMIN) tablet Take 1 tablet by mouth daily.        . nitroGLYCERIN (NITROSTAT) 0.4 MG SL tablet Place 1 tablet (0.4 mg total) under the tongue every 5 (five) minutes as needed for chest pain.  90 tablet  3  . pioglitazone-metformin (ACTOPLUS MET) 15-850 MG per tablet TAKE 1 TABLET TWICE A DAY WITH MEALS  180 tablet  1   No current facility-administered medications on file prior to visit.    No Known Allergies  Family History  Problem Relation Age of Onset  . Stroke Father   . Coronary artery disease Father   . Emphysema Mother     died  . Cancer Neg Hx     colon ,prostate    History   Social History  . Marital Status: Married    Spouse Name: N/A    Number of Children: 2  . Years of Education: N/A   Occupational History  . Vice Biomedical engineer at [employer]    Social History Main Topics  . Smoking status: Former Games developer  . Smokeless tobacco: Never Used     Comment: 2001  . Alcohol Use: 0.0 oz/week     Comment: socially  . Drug Use: No  . Sexual Activity: None   Other  Topics Concern  . None   Social History Narrative   Lives in [place], [place]               Review of Systems - see history of present illness. All other review of systems are negative.  Filed Vitals:   09/13/13 1134 09/13/13 1204  BP: 158/68 128/74  Pulse: 95   Temp: 98.5 F (36.9 C)   TempSrc: Oral   Height: 6' (1.829 m)   Weight: 274 lb 1.3 oz (124.322 kg)   SpO2: 95%    Physical Exam  Vitals reviewed. Constitutional: He is oriented to person, place, and time and well-developed, well-nourished, and in no distress.  HENT:  Head: Normocephalic and atraumatic.  Right Ear: External ear normal.  Left Ear: External ear normal.  Nose: Nose normal.  Mouth/Throat: Oropharynx is clear and moist. No oropharyngeal exudate.  Tympanic membranes within normal limits bilaterally. No tenderness to percussion of sinuses noted on  examination  Eyes: Conjunctivae are normal.  Neck: Neck supple.  Cardiovascular: Normal rate, regular rhythm, normal heart sounds and intact distal pulses.   Pulmonary/Chest: Effort normal and breath sounds normal. No respiratory distress. He has no wheezes. He has no rales. He exhibits no tenderness.  Neurological: He is alert and oriented to person, place, and time.  Skin: Skin is warm and dry. No rash noted.  Psychiatric: Affect normal.     Recent Results (from the past 2160 hour(s))  ALT     Status: None   Collection Time    06/30/13  9:40 AM      Result Value Range   ALT 21  0 - 53 U/L  AST     Status: None   Collection Time    06/30/13  9:40 AM      Result Value Range   AST 27  0 - 37 U/L  BASIC METABOLIC PANEL     Status: None   Collection Time    06/30/13  9:40 AM      Result Value Range   Sodium 136  135 - 145 mEq/L   Potassium 4.1  3.5 - 5.1 mEq/L   Chloride 104  96 - 112 mEq/L   CO2 28  19 - 32 mEq/L   Glucose, Bld 95  70 - 99 mg/dL   BUN 19  6 - 23 mg/dL   Creatinine, Ser 0.9  0.4 - 1.5 mg/dL   Calcium 9.3  8.4 - 16.1 mg/dL   GFR 09.60  >45.40 mL/min  HEMOGLOBIN A1C     Status: Abnormal   Collection Time    06/30/13  9:40 AM      Result Value Range   Hemoglobin A1C 7.2 (*) 4.6 - 6.5 %   Comment: Glycemic Control Guidelines for People with Diabetes:Non Diabetic:  <6%Goal of Therapy: <7%Additional Action Suggested:  >8%   CBC     Status: Abnormal   Collection Time    07/14/13  6:59 AM      Result Value Range   WBC 5.1  4.0 - 10.5 K/uL   RBC 4.09 (*) 4.22 - 5.81 MIL/uL   Hemoglobin 13.1  13.0 - 17.0 g/dL   HCT 98.1 (*) 19.1 - 47.8 %   MCV 94.4  78.0 - 100.0 fL   MCH 32.0  26.0 - 34.0 pg   MCHC 33.9  30.0 - 36.0 g/dL   RDW 29.5  62.1 - 30.8 %   Platelets 195  150 - 400 K/uL  COMPREHENSIVE  METABOLIC PANEL     Status: Abnormal   Collection Time    07/14/13  6:59 AM      Result Value Range   Sodium 137  135 - 145 mEq/L   Potassium 3.8  3.5 - 5.1 mEq/L    Chloride 103  96 - 112 mEq/L   CO2 25  19 - 32 mEq/L   Glucose, Bld 107 (*) 70 - 99 mg/dL   BUN 23  6 - 23 mg/dL   Creatinine, Ser 1.61  0.50 - 1.35 mg/dL   Calcium 9.7  8.4 - 09.6 mg/dL   Total Protein 7.0  6.0 - 8.3 g/dL   Albumin 4.3  3.5 - 5.2 g/dL   AST 27  0 - 37 U/L   ALT 19  0 - 53 U/L   Alkaline Phosphatase 63  39 - 117 U/L   Total Bilirubin 0.3  0.3 - 1.2 mg/dL   GFR calc non Af Amer 78 (*) >90 mL/min   GFR calc Af Amer 90 (*) >90 mL/min   Comment: (NOTE)     The eGFR has been calculated using the CKD EPI equation.     This calculation has not been validated in all clinical situations.     eGFR's persistently <90 mL/min signify possible Chronic Kidney     Disease.  TROPONIN I     Status: None   Collection Time    07/14/13  6:59 AM      Result Value Range   Troponin I <0.30  <0.30 ng/mL   Comment:            Due to the release kinetics of cTnI,     a negative result within the first hours     of the onset of symptoms does not rule out     myocardial infarction with certainty.     If myocardial infarction is still suspected,     repeat the test at appropriate intervals.  GLUCOSE, CAPILLARY     Status: None   Collection Time    07/14/13 11:33 AM      Result Value Range   Glucose-Capillary 74  70 - 99 mg/dL  PROTIME-INR     Status: None   Collection Time    07/14/13  2:10 PM      Result Value Range   Prothrombin Time 13.5  11.6 - 15.2 seconds   INR 1.05  0.00 - 1.49  APTT     Status: None   Collection Time    07/14/13  2:10 PM      Result Value Range   aPTT 28  24 - 37 seconds  COMPREHENSIVE METABOLIC PANEL     Status: Abnormal   Collection Time    07/14/13  2:10 PM      Result Value Range   Sodium 137  135 - 145 mEq/L   Potassium 4.0  3.5 - 5.1 mEq/L   Chloride 103  96 - 112 mEq/L   CO2 25  19 - 32 mEq/L   Glucose, Bld 69 (*) 70 - 99 mg/dL   BUN 18  6 - 23 mg/dL   Creatinine, Ser 0.45  0.50 - 1.35 mg/dL   Calcium 9.0  8.4 - 40.9 mg/dL   Total  Protein 6.5  6.0 - 8.3 g/dL   Albumin 3.7  3.5 - 5.2 g/dL   AST 25  0 - 37 U/L   ALT 18  0 - 53 U/L   Alkaline Phosphatase  55  39 - 117 U/L   Total Bilirubin 0.3  0.3 - 1.2 mg/dL   GFR calc non Af Amer 90 (*) >90 mL/min   GFR calc Af Amer >90  >90 mL/min   Comment: (NOTE)     The eGFR has been calculated using the CKD EPI equation.     This calculation has not been validated in all clinical situations.     eGFR's persistently <90 mL/min signify possible Chronic Kidney     Disease.  HEMOGLOBIN A1C     Status: Abnormal   Collection Time    07/14/13  2:10 PM      Result Value Range   Hemoglobin A1C 6.5 (*) <5.7 %   Comment: (NOTE)                                                                               According to the ADA Clinical Practice Recommendations for 2011, when     HbA1c is used as a screening test:      >=6.5%   Diagnostic of Diabetes Mellitus               (if abnormal result is confirmed)     5.7-6.4%   Increased risk of developing Diabetes Mellitus     References:Diagnosis and Classification of Diabetes Mellitus,Diabetes     Care,2011,34(Suppl 1):S62-S69 and Standards of Medical Care in             Diabetes - 2011,Diabetes Care,2011,34 (Suppl 1):S11-S61.   Mean Plasma Glucose 140 (*) <117 mg/dL   Comment: Performed at [facility]  TROPONIN I     Status: None   Collection Time    07/14/13  2:10 PM      Result Value Range   Troponin I <0.30  <0.30 ng/mL   Comment:            Due to the release kinetics of cTnI,     a negative result within the first hours     of the onset of symptoms does not rule out     myocardial infarction with certainty.     If myocardial infarction is still suspected,     repeat the test at appropriate intervals.  CBC     Status: Abnormal   Collection Time    07/14/13  2:10 PM      Result Value Range   WBC 5.4  4.0 - 10.5 K/uL   RBC 3.98 (*) 4.22 - 5.81 MIL/uL   Hemoglobin 12.7 (*) 13.0 - 17.0 g/dL   HCT 16.1 (*) 09.6 -  52.0 %   MCV 92.5  78.0 - 100.0 fL   MCH 31.9  26.0 - 34.0 pg   MCHC 34.5  30.0 - 36.0 g/dL   RDW 04.5  40.9 - 81.1 %   Platelets 200  150 - 400 K/uL  GLUCOSE, CAPILLARY     Status: None   Collection Time    07/14/13  4:17 PM      Result Value Range   Glucose-Capillary 78  70 - 99 mg/dL  TROPONIN I     Status: None   Collection Time    07/14/13  6:26  PM      Result Value Range   Troponin I <0.30  <0.30 ng/mL   Comment:            Due to the release kinetics of cTnI,     a negative result within the first hours     of the onset of symptoms does not rule out     myocardial infarction with certainty.     If myocardial infarction is still suspected,     repeat the test at appropriate intervals.  GLUCOSE, CAPILLARY     Status: Abnormal   Collection Time    07/14/13  7:50 PM      Result Value Range   Glucose-Capillary 114 (*) 70 - 99 mg/dL  TROPONIN I     Status: None   Collection Time    07/15/13  5:25 AM      Result Value Range   Troponin I <0.30  <0.30 ng/mL   Comment:            Due to the release kinetics of cTnI,     a negative result within the first hours     of the onset of symptoms does not rule out     myocardial infarction with certainty.     If myocardial infarction is still suspected,     repeat the test at appropriate intervals.  LIPID PANEL     Status: None   Collection Time    07/15/13  5:25 AM      Result Value Range   Cholesterol 107  0 - 200 mg/dL   Triglycerides 68  <161 mg/dL   HDL 47  >09 mg/dL   Total CHOL/HDL Ratio 2.3     VLDL 14  0 - 40 mg/dL   LDL Cholesterol 46  0 - 99 mg/dL   Comment:            Total Cholesterol/HDL:CHD Risk     Coronary Heart Disease Risk Table                         Men   Women      1/2 Average Risk   3.4   3.3      Average Risk       5.0   4.4      2 X Average Risk   9.6   7.1      3 X Average Risk  23.4   11.0                Use the calculated Patient Ratio     above and the CHD Risk Table     to determine the  patient's CHD Risk.                ATP III CLASSIFICATION (LDL):      <100     mg/dL   Optimal      604-540  mg/dL   Near or Above                        Optimal      130-159  mg/dL   Borderline      981-191  mg/dL   High      >478     mg/dL   Very High  CBC     Status: Abnormal   Collection Time    07/15/13  5:25 AM  Result Value Range   WBC 4.0  4.0 - 10.5 K/uL   RBC 3.94 (*) 4.22 - 5.81 MIL/uL   Hemoglobin 12.4 (*) 13.0 - 17.0 g/dL   HCT 13.2 (*) 44.0 - 10.2 %   MCV 93.4  78.0 - 100.0 fL   MCH 31.5  26.0 - 34.0 pg   MCHC 33.7  30.0 - 36.0 g/dL   RDW 72.5  36.6 - 44.0 %   Platelets 193  150 - 400 K/uL  BASIC METABOLIC PANEL     Status: None   Collection Time    07/15/13  5:25 AM      Result Value Range   Sodium 137  135 - 145 mEq/L   Potassium 4.0  3.5 - 5.1 mEq/L   Chloride 102  96 - 112 mEq/L   CO2 25  19 - 32 mEq/L   Glucose, Bld 98  70 - 99 mg/dL   BUN 17  6 - 23 mg/dL   Creatinine, Ser 3.47  0.50 - 1.35 mg/dL   Calcium 8.6  8.4 - 42.5 mg/dL   GFR calc non Af Amer >90  >90 mL/min   GFR calc Af Amer >90  >90 mL/min   Comment: (NOTE)     The eGFR has been calculated using the CKD EPI equation.     This calculation has not been validated in all clinical situations.     eGFR's persistently <90 mL/min signify possible Chronic Kidney     Disease.  GLUCOSE, CAPILLARY     Status: Abnormal   Collection Time    07/15/13  8:02 AM      Result Value Range   Glucose-Capillary 104 (*) 70 - 99 mg/dL  CK     Status: None   Collection Time    07/21/13 10:10 AM      Result Value Range   Total CK 97  7 - 232 U/L  TROPONIN I     Status: None   Collection Time    07/21/13 10:10 AM      Result Value Range   Troponin I <0.30  <0.30 ng/mL   Comment: No indication of myocardial injury.           Due to the release kinetics of cTnI, a negative result within the     first hours of the onset of symptoms does not rule out myocardial     infarction with certainty. If myocardial  infarction is still suspected     repeat the test at appropriate intervals.  CK TOTAL AND CKMB     Status: None   Collection Time    07/21/13 10:10 AM      Result Value Range   Total CK 114  7 - 232 U/L   CK, MB 2.7  0.3 - 4.0 ng/mL   Relative Index 2.4  0.0 - 2.5   Assessment/Plan: Viral URI with cough Supportive care. Rest. Increase fluid intake. Saline nasal spray. Rx Tussionex for cough. Please humidifier in bedroom. Please call or return to clinic if symptoms are not improving

## 2013-09-27 ENCOUNTER — Ambulatory Visit (INDEPENDENT_AMBULATORY_CARE_PROVIDER_SITE_OTHER): Payer: BC Managed Care – PPO | Admitting: Internal Medicine

## 2013-09-27 ENCOUNTER — Encounter: Payer: Self-pay | Admitting: Internal Medicine

## 2013-09-27 VITALS — BP 148/73 | HR 78 | Temp 98.0°F | Wt 267.0 lb

## 2013-09-27 DIAGNOSIS — R55 Syncope and collapse: Secondary | ICD-10-CM

## 2013-09-27 DIAGNOSIS — E119 Type 2 diabetes mellitus without complications: Secondary | ICD-10-CM

## 2013-09-27 DIAGNOSIS — K219 Gastro-esophageal reflux disease without esophagitis: Secondary | ICD-10-CM

## 2013-09-27 DIAGNOSIS — I10 Essential (primary) hypertension: Secondary | ICD-10-CM

## 2013-09-27 DIAGNOSIS — I2510 Atherosclerotic heart disease of native coronary artery without angina pectoris: Secondary | ICD-10-CM

## 2013-09-27 NOTE — Assessment & Plan Note (Addendum)
Since the last time he was here he was admitted with chest pain, PPIs dose increased, now asymptomatic. Also a ultrasound showed gallbladder polyps, saw GI, plan is observation

## 2013-09-27 NOTE — Patient Instructions (Signed)
Next visit for a physical exam   fasting, in 4-5 months  Please make an appointment

## 2013-09-27 NOTE — Progress Notes (Signed)
Pre visit review using our clinic review tool, if applicable. No additional management support is needed unless otherwise documented below in the visit note. 

## 2013-09-27 NOTE — Progress Notes (Signed)
   Subjective:    Patient ID: Daniel Cruz, male    DOB: [birth year], 62 y.o.   MRN: [id number]  HPI Routine office visit Since the last time he was here, he was briefly admitted to the hospital with chest pain subsequently seen by GI and cardiology. Symptoms felt to be GI related. Hospital records and cardiology notes review. Meds reviewed, good compliance without apparent side effects Not ambulatory BPs or CBGs.  Past Medical History  Diagnosis Date  . OSA (obstructive sleep apnea)     can't tol. a CPAP  . UTI (lower urinary tract infection)     in the 80s and 11/09  . Fatty liver 02/2009    per u/s  . DIABETES MELLITUS, TYPE II 02/26/2007  . HTN (hypertension)   . GERD (gastroesophageal reflux disease)   . CAD (coronary artery disease)     dr Mayford Knife, [place] 2001 (stents x 2), syncope 2010   Past Surgical History  Procedure Laterality Date  . Septoplasty      w/ bilateral inferior turbinate reductions  . Tonsillectomy       Review of Systems Denies Chest pain or shortness of breath No GERD symptoms, denies dysphagia or odynophagia. Denies nausea, vomiting or diarrhea . Denies feet paresthesias, feet pain has decrease, he has been more active lately    Objective:   Physical Exam BP 148/73  Pulse 78  Temp(Src) 98 F (36.7 C)  Wt 267 lb (121.11 kg)  SpO2 95% General -- alert, well-developed, NAD.  Lungs -- normal respiratory effort, no intercostal retractions, no accessory muscle use, and normal breath sounds.  Heart-- normal rate, regular rhythm, no murmur.  DIABETIC FEET EXAM: No lower extremity edema Normal pedal pulses bilaterally Skin normal, nails normal, no calluses Pinprick examination of the feet normal. Neurologic--  alert & oriented X3. Speech normal, gait normal, strength normal in all extremities.  Psych-- Cognition and judgment appear intact. Cooperative with normal attention span and concentration. No anxious appearing , no depressed appearing.       Assessment & Plan:

## 2013-09-27 NOTE — Assessment & Plan Note (Signed)
BP today slightly elevated but usually within normal. Recent CMP normal, no change

## 2013-09-27 NOTE — Assessment & Plan Note (Signed)
Well-controlled per last A1c, not ambulatory CBGs, and she has improved his lifestyle, exercising more. Feet exam today negative Plan: No change

## 2013-09-27 NOTE — Assessment & Plan Note (Addendum)
Admitted 07-2013 w/ chest pain, he rule out, subsequent stress test was low risk. Continue with present care

## 2013-10-04 ENCOUNTER — Other Ambulatory Visit: Payer: Self-pay | Admitting: Internal Medicine

## 2013-10-04 NOTE — Telephone Encounter (Signed)
Lisinopril and Vytorin refilled per protocol. JG//CMA

## 2013-11-18 ENCOUNTER — Ambulatory Visit: Payer: BC Managed Care – PPO | Admitting: Cardiology

## 2013-12-01 ENCOUNTER — Encounter: Payer: BC Managed Care – PPO | Admitting: Internal Medicine

## 2013-12-04 ENCOUNTER — Other Ambulatory Visit: Payer: Self-pay | Admitting: Internal Medicine

## 2013-12-09 ENCOUNTER — Other Ambulatory Visit: Payer: Self-pay | Admitting: Internal Medicine

## 2014-02-03 ENCOUNTER — Other Ambulatory Visit: Payer: Self-pay | Admitting: Internal Medicine

## 2014-02-25 ENCOUNTER — Encounter: Payer: BC Managed Care – PPO | Admitting: Internal Medicine

## 2014-03-09 ENCOUNTER — Other Ambulatory Visit: Payer: Self-pay | Admitting: Internal Medicine

## 2014-03-09 DIAGNOSIS — E1110 Type 2 diabetes mellitus with ketoacidosis without coma: Secondary | ICD-10-CM

## 2014-03-09 NOTE — Telephone Encounter (Signed)
Refill for glipizide sent to [facility]

## 2014-03-11 ENCOUNTER — Other Ambulatory Visit: Payer: Self-pay | Admitting: Internal Medicine

## 2014-05-04 ENCOUNTER — Other Ambulatory Visit: Payer: Self-pay | Admitting: Internal Medicine

## 2014-05-05 LAB — HM DIABETES EYE EXAM

## 2014-05-12 ENCOUNTER — Other Ambulatory Visit: Payer: Self-pay | Admitting: Internal Medicine

## 2014-05-15 ENCOUNTER — Other Ambulatory Visit: Payer: Self-pay | Admitting: Internal Medicine

## 2014-05-17 ENCOUNTER — Encounter: Payer: Self-pay | Admitting: General Practice

## 2014-06-06 ENCOUNTER — Telehealth: Payer: Self-pay | Admitting: Internal Medicine

## 2014-06-06 NOTE — Telephone Encounter (Signed)
Left patient a voicemail to call back to schedule CPE.  Last CPE was 11/30/2012.

## 2014-06-08 ENCOUNTER — Other Ambulatory Visit: Payer: Self-pay | Admitting: Internal Medicine

## 2014-06-20 ENCOUNTER — Telehealth: Payer: Self-pay | Admitting: *Deleted

## 2014-06-20 NOTE — Telephone Encounter (Signed)
VM left for pt in regards to DB f/u for a BP check  

## 2014-07-22 ENCOUNTER — Other Ambulatory Visit: Payer: Self-pay

## 2014-08-09 ENCOUNTER — Other Ambulatory Visit: Payer: Self-pay | Admitting: Gastroenterology

## 2014-08-09 DIAGNOSIS — K824 Cholesterolosis of gallbladder: Secondary | ICD-10-CM

## 2014-08-10 ENCOUNTER — Other Ambulatory Visit: Payer: Self-pay | Admitting: Internal Medicine

## 2014-08-13 ENCOUNTER — Other Ambulatory Visit: Payer: Self-pay | Admitting: Internal Medicine

## 2014-08-18 ENCOUNTER — Ambulatory Visit
Admission: RE | Admit: 2014-08-18 | Discharge: 2014-08-18 | Disposition: A | Discharge status: Home / Self Care | Payer: BC Managed Care – PPO | Source: Ambulatory Visit | Attending: Gastroenterology | Admitting: Gastroenterology

## 2014-08-18 DIAGNOSIS — K824 Cholesterolosis of gallbladder: Secondary | ICD-10-CM

## 2014-08-24 ENCOUNTER — Encounter: Payer: Self-pay | Admitting: Internal Medicine

## 2014-08-24 ENCOUNTER — Ambulatory Visit (INDEPENDENT_AMBULATORY_CARE_PROVIDER_SITE_OTHER): Payer: BC Managed Care – PPO | Admitting: Internal Medicine

## 2014-08-24 ENCOUNTER — Ambulatory Visit (INDEPENDENT_AMBULATORY_CARE_PROVIDER_SITE_OTHER): Payer: BC Managed Care – PPO

## 2014-08-24 VITALS — BP 136/84 | HR 80 | Temp 98.1°F | Ht 70.0 in | Wt 279.5 lb

## 2014-08-24 DIAGNOSIS — Z0000 Encounter for general adult medical examination without abnormal findings: Secondary | ICD-10-CM

## 2014-08-24 DIAGNOSIS — E119 Type 2 diabetes mellitus without complications: Secondary | ICD-10-CM

## 2014-08-24 DIAGNOSIS — R079 Chest pain, unspecified: Secondary | ICD-10-CM

## 2014-08-24 DIAGNOSIS — Z23 Encounter for immunization: Secondary | ICD-10-CM

## 2014-08-24 DIAGNOSIS — I2510 Atherosclerotic heart disease of native coronary artery without angina pectoris: Secondary | ICD-10-CM

## 2014-08-24 DIAGNOSIS — I10 Essential (primary) hypertension: Secondary | ICD-10-CM

## 2014-08-24 LAB — CBC WITH DIFFERENTIAL/PLATELET
BASOS ABS: 0 10*3/uL (ref 0.0–0.1)
Basophils Relative: 0.1 % (ref 0.0–3.0)
EOS ABS: 0 10*3/uL (ref 0.0–0.7)
Eosinophils Relative: 0.1 % (ref 0.0–5.0)
HEMATOCRIT: 35.8 % — AB (ref 39.0–52.0)
Hemoglobin: 11.7 g/dL — ABNORMAL LOW (ref 13.0–17.0)
LYMPHS ABS: 0.6 10*3/uL — AB (ref 0.7–4.0)
Lymphocytes Relative: 7.4 % — ABNORMAL LOW (ref 12.0–46.0)
MCHC: 32.6 g/dL (ref 30.0–36.0)
MCV: 91.6 fl (ref 78.0–100.0)
MONO ABS: 0.3 10*3/uL (ref 0.1–1.0)
Monocytes Relative: 3.3 % (ref 3.0–12.0)
Neutro Abs: 7.5 10*3/uL (ref 1.4–7.7)
Neutrophils Relative %: 89.1 % — ABNORMAL HIGH (ref 43.0–77.0)
PLATELETS: 213 10*3/uL (ref 150.0–400.0)
RBC: 3.91 Mil/uL — ABNORMAL LOW (ref 4.22–5.81)
RDW: 16.5 % — AB (ref 11.5–15.5)
WBC: 8.4 10*3/uL (ref 4.0–10.5)

## 2014-08-24 LAB — COMPREHENSIVE METABOLIC PANEL
ALT: 22 U/L (ref 0–53)
AST: 26 U/L (ref 0–37)
Albumin: 4 g/dL (ref 3.5–5.2)
Alkaline Phosphatase: 55 U/L (ref 39–117)
BILIRUBIN TOTAL: 0.6 mg/dL (ref 0.2–1.2)
BUN: 24 mg/dL — ABNORMAL HIGH (ref 6–23)
CHLORIDE: 106 meq/L (ref 96–112)
CO2: 24 mEq/L (ref 19–32)
CREATININE: 1 mg/dL (ref 0.4–1.5)
Calcium: 9.1 mg/dL (ref 8.4–10.5)
GFR: 79.53 mL/min (ref >60.00)
Glucose, Bld: 203 mg/dL — ABNORMAL HIGH (ref 70–99)
Potassium: 4.4 mEq/L (ref 3.5–5.1)
SODIUM: 139 meq/L (ref 135–145)
TOTAL PROTEIN: 6.7 g/dL (ref 6.0–8.3)

## 2014-08-24 LAB — TSH: TSH: 0.74 u[IU]/mL (ref 0.35–4.50)

## 2014-08-24 LAB — LIPID PANEL
CHOL/HDL RATIO: 2
Cholesterol: 116 mg/dL (ref 0–200)
HDL: 53.9 mg/dL (ref >39.00)
LDL Cholesterol: 54 mg/dL (ref 0–99)
NonHDL: 62.1
Triglycerides: 42 mg/dL (ref 0.0–149.0)
VLDL: 8.4 mg/dL (ref 0.0–40.0)

## 2014-08-24 LAB — GLUCOSE, POCT (MANUAL RESULT ENTRY): POC GLUCOSE: 210 mg/dL — AB (ref 70–99)

## 2014-08-24 LAB — PSA: PSA: 1.03 ng/mL (ref 0.10–4.00)

## 2014-08-24 LAB — HEMOGLOBIN A1C: HEMOGLOBIN A1C: 7.1 % — AB (ref 4.6–6.5)

## 2014-08-24 MED ORDER — NITROGLYCERIN 0.4 MG SL SUBL: 0.4000 mg | SUBLINGUAL_TABLET | SUBLINGUAL | Status: DC | PRN

## 2014-08-24 NOTE — Progress Notes (Signed)
Pre visit review using our clinic review tool, if applicable. No additional management support is needed unless otherwise documented below in the visit note. 

## 2014-08-24 NOTE — Assessment & Plan Note (Signed)
Td 05 (next 2016) pneumonia 08 prevnar-- today zostavax 2014 Flu shot today  had Cscope 2003 negative ,again in 2014 Dr Oletta Lamas, next per GI  Diet and exercise discussed  Labs

## 2014-08-24 NOTE — Patient Instructions (Addendum)
Get labs today  Check the  blood pressure   weekly  Be sure your blood pressure is between  145/85  and 110/65.  if it is consistently higher or lower, let me know  Diabetes: Check your blood sugar 3-4 times a week at different times  GOALS: Fasting before a meal 70- 130 2 hours after a meal less than 180 At bedtime 90-150 Call if consistently not at goal ---- Remember that you need your eyes checked at least once a year to be sure you don't have "retinopathy"    MYFITNESSPAL? START A GRADUAL EXERCISE PROGRAM  If you need more information about a healthy diet, heart disease, diabetes, hypertension visit  the American Heart Association, it  is a great resource online at:  [email or web address]-[email or web address]    Please come back to the office in 3 months  for a routine check up

## 2014-08-24 NOTE — Progress Notes (Signed)
Subjective:    Patient ID: Daniel Cruz, male    DOB: 26-May-1951, 63 y.o.   MRN: [id number]  DOS:  08/24/2014 Type of visit - description : cpx  Interval history: We also discussed other issues, see a/p   ROS Diet-- eating less Exercise-- limited d/t plantar fasciitis, gained wt No  CP, SOB Denies  nausea, vomiting diarrhea, blood in the stools (-) cough, sputum production (-) wheezing, chest congestion No dysuria, gross hematuria, difficulty urinating  No anxiety, depression  Past Medical History  Diagnosis Date  . OSA (obstructive sleep apnea)     can't tol. a CPAP  . UTI (lower urinary tract infection)     in the 80s and 11/09  . Fatty liver 02/2009    per u/s  . DIABETES MELLITUS, TYPE II 02/26/2007  . HTN (hypertension)   . GERD (gastroesophageal reflux disease)   . CAD (coronary artery disease)     dr Radford Pax, [place] 2001 (stents x 2), syncope 2010  . Plantar fasciitis of left foot     Past Surgical History  Procedure Laterality Date  . Septoplasty      w/ bilateral inferior turbinate reductions  . Tonsillectomy      History   Social History  . Marital Status: Married    Spouse Name: N/A    Number of Children: 2  . Years of Education: N/A   Occupational History  . Vice Engineer, production at [employer]  . Smoking status: Former Research scientist (life sciences)  . Smokeless tobacco: Never Used     Comment: 2001  . Alcohol Use: 0.0 oz/week     Comment: socially  . Drug Use: No  . Sexual Activity: Not on file   Other Topics Concern  . Not on file   Social History Narrative   Lives in [place], [place]   Lives w/ wife, 2 children, 2 g-kids                 Family History  Problem Relation Age of Onset  . Stroke Father   . Coronary artery disease Father   . Emphysema Mother     died  . Cancer Neg Hx     colon ,prostate  . Diabetes Neg Hx   . COPD Mother       Medication List       This list is accurate  as of: 08/24/14  6:23 PM.  Always use your most recent med list.               aspirin 325 MG tablet  Take 325 mg by mouth daily.     clopidogrel 75 MG tablet  Commonly known as:  PLAVIX  TAKE 1 TABLET DAILY     GLIPIZIDE XL 5 MG 24 hr tablet  Generic drug:  glipiZIDE  TAKE 1 TABLET TWICE A DAY     lisinopril-hydrochlorothiazide 20-12.5 MG per tablet  Commonly known as:  PRINZIDE,ZESTORETIC  TAKE 1 TABLET DAILY     methylPREDNISolone 4 MG tablet  Commonly known as:  MEDROL DOSEPAK  Take 4 mg by mouth daily. follow package directions     multivitamin tablet  Take 1 tablet by mouth daily.     nitroGLYCERIN 0.4 MG SL tablet  Commonly known as:  NITROSTAT  Place 1 tablet (0.4 mg total) under the tongue every 5 (five) minutes as needed for chest pain.     pantoprazole 40 MG tablet  Commonly known as:  PROTONIX  Take 40 mg by mouth 2 (two) times daily.     pioglitazone-metformin 15-850 MG per tablet  Commonly known as:  ACTOPLUS MET  TAKE 1 TABLET TWICE A DAY WITH MEALS     VYTORIN 10-10 MG per tablet  Generic drug:  ezetimibe-simvastatin  TAKE 1 TABLET DAILY           Objective:   Physical Exam BP 136/84 mmHg  Pulse 80  Temp(Src) 98.1 F (36.7 C) (Oral)  Ht _0  (1.778 m)  Wt 279 lb 8 oz (126.78 kg)  BMI 40.10 kg/m2  SpO2 100% General -- alert, well-developed, NAD.  Neck --no thyromegaly , normal carotid pulse  HEENT-- Not pale.  Lungs -- normal respiratory effort, no intercostal retractions, no accessory muscle use, and normal breath sounds.  Heart-- normal rate, regular rhythm, no murmur.  Abdomen-- Not distended, good bowel sounds,soft, non-tender. No bruit Rectal-- + external hemorrhoids. Normal sphincter tone. No rectal masses or tenderness. Stool brown Prostate--Prostate gland firm and smooth, no enlargement, nodularity, tenderness, mass, asymmetry or induration. Extremities-- no pretibial edema bilaterally  Neurologic--  alert & oriented X3.  Speech normal, gait appropriate for age, strength symmetric and appropriate for age.  Psych-- Cognition and judgment appear intact. Cooperative with normal attention span and concentration. No anxious or depressed appearing.      Assessment & Plan:  In addition to the physical exam we did spend more than 15 minutes addressing chronic medical problems, see below.  Diabetes, CAD, high cholesterol, hypertension: Good compliance with medications, check appropriate labs Recommend to check CBGs at least 3 or 4 times a week Recommend BPs at least weekly CBG today 210 Patient inquired about medications for diabetes that may promote weight loss , we can discuss on return to the office  Planta fasciitis, Under the care of another doctor, started prednisone few days ago, CBG today We'll proceed with labs as prednisone for a few days won't affect the A1c  Mild dyspnea on exertion over the last year, The patient thinks is related to weight gain and inability to exercise, I agree, he had a low risk stress test last year. Plan: Gradual exercise program and weight loss. Counting calories?  Also complaining about and decrease in sense of smell and taste. Denies headache, diplopia, nasal congestion. I review his medications, I don't see one that  account for those problems. We discussed a referral to neurology but he's not interested on this point. Plan: Reassess on return to the office

## 2014-10-09 ENCOUNTER — Other Ambulatory Visit: Payer: Self-pay | Admitting: Internal Medicine

## 2014-11-02 ENCOUNTER — Ambulatory Visit (INDEPENDENT_AMBULATORY_CARE_PROVIDER_SITE_OTHER): Payer: BLUE CROSS/BLUE SHIELD | Admitting: Medical

## 2014-11-02 ENCOUNTER — Encounter: Payer: Self-pay | Admitting: Medical

## 2014-11-02 VITALS — BP 157/92 | HR 62 | Temp 98.1°F | Ht 70.0 in | Wt 273.8 lb

## 2014-11-02 DIAGNOSIS — J0110 Acute frontal sinusitis, unspecified: Secondary | ICD-10-CM | POA: Insufficient documentation

## 2014-11-02 MED ORDER — AMOXICILLIN-POT CLAVULANATE 875-125 MG PO TABS: 1.0000 | ORAL_TABLET | Freq: Two times a day (BID) | ORAL | Status: DC

## 2014-11-02 MED ORDER — BENZONATATE 100 MG PO CAPS: 100.0000 mg | ORAL_CAPSULE | Freq: Three times a day (TID) | ORAL | Status: DC | PRN

## 2014-11-02 MED ORDER — FLUTICASONE PROPIONATE 50 MCG/ACT NA SUSP: 2.0000 | Freq: Every day | NASAL | Status: DC

## 2014-11-02 NOTE — Patient Instructions (Signed)
Your appear to have a sinus infection. I am prescribing augmentin  antibiotic for the infection. To help with the nasal congestion I prescribed flonase  nasal steroid. For your associated cough, I prescribed cough medicine benzonatate  Rest, hydrate, tylenol for fever.  Follow up in 7 days or as needed.

## 2014-11-02 NOTE — Progress Notes (Signed)
Pre visit review using our clinic review tool, if applicable. No additional management support is needed unless otherwise documented below in the visit note. 

## 2014-11-02 NOTE — Assessment & Plan Note (Signed)
Your appear to have a sinus infection. I am prescribing augmentin  antibiotic for the infection. To help with the nasal congestion I prescribed flonase  nasal steroid. For your associated cough, I prescribed cough medicine benzonatate

## 2014-11-02 NOTE — Progress Notes (Signed)
Subjective:    Patient ID: Daniel Cruz, male    DOB: 30-Jul-1951, 64 y.o.   MRN: [id number]  HPI   Pt in today reporting cough, nasal congestion and runny nose for 4 wks.  Pt tried mucinex d early on. Only helped with drying him up. Pt got on call doctor opinion from about 1 wk ago. Mild st with sinus pressure.  Associated symptoms( below yes or no)  Fever-no Chills-no Chest congestion-yes, mild. Sneezing- No Itching eyes-No Sore throat- yes mild Post-nasal drainage-yes Wheezing-No Purulent  Nasal drainage-yes Fatigue-Yes, mild.  Past Medical History  Diagnosis Date  . OSA (obstructive sleep apnea)     can't tol. a CPAP  . UTI (lower urinary tract infection)     in the 80s and 11/09  . Fatty liver 02/2009    per u/s  . DIABETES MELLITUS, TYPE II 02/26/2007  . HTN (hypertension)   . GERD (gastroesophageal reflux disease)   . CAD (coronary artery disease)     dr Radford Pax, [place] 2001 (stents x 2), syncope 2010  . Plantar fasciitis of left foot     History   Social History  . Marital Status: Married    Spouse Name: N/A    Number of Children: 2  . Years of Education: N/A   Occupational History  . 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 Smoking status: Former Research scientist (life sciences)  . Smokeless tobacco: Never Used     Comment: 2001  . Alcohol Use: 0.0 oz/week     Comment: socially  . Drug Use: No  . Sexual Activity: Not on file   Other Topics Concern  . Not on file   Social History Narrative   Lives in [place], [place]   Lives w/ wife, 2 children, 2 g-kids                Past Surgical History  Procedure Laterality Date  . Septoplasty      w/ bilateral inferior turbinate reductions  . Tonsillectomy      Family History  Problem Relation Age of Onset  . Stroke Father   . Coronary artery disease Father   . Emphysema Mother     died  . Cancer Neg Hx     colon ,prostate  . Diabetes Neg Hx   . COPD Mother       No Known Allergies  Current Outpatient Prescriptions on File Prior to Visit  Medication Sig Dispense Refill  . aspirin 325 MG tablet Take 325 mg by mouth daily.    . clopidogrel (PLAVIX) 75 MG tablet TAKE 1 TABLET DAILY 90 tablet 0  . GLIPIZIDE XL 5 MG 24 hr tablet TAKE 1 TABLET TWICE A DAY 180 tablet 0  . lisinopril-hydrochlorothiazide (PRINZIDE,ZESTORETIC) 20-12.5 MG per tablet TAKE 1 TABLET DAILY 30 tablet 0  . Multiple Vitamin (MULTIVITAMIN) tablet Take 1 tablet by mouth daily.      . nitroGLYCERIN (NITROSTAT) 0.4 MG SL tablet Place 1 tablet (0.4 mg total) under the tongue every 5 (five) minutes as needed for chest pain. 30 tablet 3  . pantoprazole (PROTONIX) 40 MG tablet Take 40 mg by mouth 2 (two) times daily.    . pioglitazone-metformin (ACTOPLUS MET) 15-850 MG per tablet TAKE 1 TABLET TWICE A DAY WITH MEALS 180 tablet 1  . VYTORIN 10-10 MG per tablet TAKE 1 TABLET DAILY 30 tablet 0   No current facility-administered medications on file prior  to visit.    BP 157/92 mmHg  Pulse 62  Temp(Src) 98.1 F (36.7 C) (Oral)  Ht $R'5\' 10"'lR$  (1.778 m)  Wt 273 lb 12.8 oz (124.195 kg)  BMI 39.29 kg/m2  SpO2 95%        Review of Systems  Constitutional: Negative for fever and chills.  HENT: Positive for congestion, postnasal drip, rhinorrhea, sinus pressure and sore throat. Negative for ear pain and trouble swallowing.   Respiratory: Positive for cough. Negative for choking, shortness of breath and wheezing.   Cardiovascular: Negative for chest pain, palpitations and leg swelling.  Gastrointestinal: Negative for abdominal pain.  Musculoskeletal: Negative for back pain.  Neurological: Negative for dizziness, facial asymmetry and headaches.  Hematological: Negative for adenopathy. Does not bruise/bleed easily.       Objective:   Physical Exam   General  Mental Status - Alert. General Appearance - Well groomed. Not in acute distress.  Skin Rashes- No Rashes.  HEENT Head-  Normal. Ear Auditory Canal - Left- Normal. Right - Normal.Tympanic Membrane- Left- Normal. Right- Normal. Eye Sclera/Conjunctiva- Left- Normal. Right- Normal. Nose & Sinuses Nasal Mucosa- Left-  Boggy and Congested. Right-  Boggy and  Congested.No maxillary but  frontal sinus pressure. Mouth & Throat Lips: Upper Lip- Normal: no dryness, cracking, pallor, cyanosis, or vesicular eruption. Lower Lip-Normal: no dryness, cracking, pallor, cyanosis or vesicular eruption. Buccal Mucosa- Bilateral- No Aphthous ulcers. Oropharynx- No Discharge or Erythema. Tonsils: Characteristics- Bilateral- No Erythema or Congestion. Size/Enlargement- Bilateral- No enlargement. Discharge- bilateral-None.  Neck Neck- Supple. No Masses.   Chest and Lung Exam Auscultation: Breath Sounds:-Clear even and unlabored.  Cardiovascular Auscultation:Rythm- Regular, rate and rhythm. Murmurs & Other Heart Sounds:Ausculatation of the heart reveal- No Murmurs.  Lymphatic Head & Neck General Head & Neck Lymphatics: Bilateral: Description- No Localized lymphadenopathy.        Assessment & Plan:

## 2014-11-06 ENCOUNTER — Other Ambulatory Visit: Payer: Self-pay | Admitting: Internal Medicine

## 2014-11-07 ENCOUNTER — Other Ambulatory Visit: Payer: Self-pay

## 2014-11-11 ENCOUNTER — Other Ambulatory Visit: Payer: Self-pay | Admitting: Internal Medicine

## 2014-11-21 ENCOUNTER — Ambulatory Visit: Payer: BC Managed Care – PPO | Admitting: Internal Medicine

## 2015-01-05 ENCOUNTER — Other Ambulatory Visit: Payer: Self-pay

## 2015-01-06 ENCOUNTER — Ambulatory Visit (INDEPENDENT_AMBULATORY_CARE_PROVIDER_SITE_OTHER): Payer: BLUE CROSS/BLUE SHIELD | Admitting: Internal Medicine

## 2015-01-06 ENCOUNTER — Encounter: Payer: Self-pay | Admitting: Internal Medicine

## 2015-01-06 ENCOUNTER — Other Ambulatory Visit: Payer: Self-pay

## 2015-01-06 VITALS — BP 132/86 | HR 67 | Temp 98.2°F | Ht 70.0 in | Wt 280.4 lb

## 2015-01-06 DIAGNOSIS — D649 Anemia, unspecified: Secondary | ICD-10-CM | POA: Diagnosis not present

## 2015-01-06 DIAGNOSIS — I2510 Atherosclerotic heart disease of native coronary artery without angina pectoris: Secondary | ICD-10-CM

## 2015-01-06 DIAGNOSIS — R0609 Other forms of dyspnea: Secondary | ICD-10-CM

## 2015-01-06 DIAGNOSIS — E119 Type 2 diabetes mellitus without complications: Secondary | ICD-10-CM

## 2015-01-06 DIAGNOSIS — I10 Essential (primary) hypertension: Secondary | ICD-10-CM | POA: Diagnosis not present

## 2015-01-06 MED ORDER — CLOPIDOGREL BISULFATE 75 MG PO TABS: 75.0000 mg | ORAL_TABLET | Freq: Every day | ORAL | Status: DC

## 2015-01-06 MED ORDER — EZETIMIBE-SIMVASTATIN 10-10 MG PO TABS: 1.0000 | ORAL_TABLET | Freq: Every day | ORAL | Status: DC

## 2015-01-06 MED ORDER — PIOGLITAZONE HCL-METFORMIN HCL 15-850 MG PO TABS: 1.0000 | ORAL_TABLET | Freq: Two times a day (BID) | ORAL | Status: DC

## 2015-01-06 MED ORDER — LISINOPRIL-HYDROCHLOROTHIAZIDE 20-12.5 MG PO TABS: 1.0000 | ORAL_TABLET | Freq: Every day | ORAL | Status: DC

## 2015-01-06 MED ORDER — GLIPIZIDE ER 5 MG PO TB24: 5.0000 mg | ORAL_TABLET | Freq: Two times a day (BID) | ORAL | Status: DC

## 2015-01-06 NOTE — Progress Notes (Signed)
Subjective:    Patient ID: Daniel Cruz, male    DOB: 02-11-1951, 64 y.o.   MRN: [id number]  DOS:  01/06/2015 Type of visit - description : rov Interval history: Diabetes, good medication compliance, last A1c 7.1, is only in the last few weeks that he has started to exercise more. He did decrease his portion sizes few months ago. No weight loss, the patient is somewhat disappointed High cholesterol, good compliance of medications. Hypertension, good compliance of medication, last BMP satisfactory, BP today is very good He complained of dyspnea on exertion the last time he was here, report is doing better "the more I walk , the less short of breath I get". Decreased sense of smell, unchanged, denies headaches. Was found to have mild anemia, due for a recheck   CBC   Review of Systems Denies chest pain, lower extremity edema or palpitations No nausea, vomiting, diarrhea blood in the stools or abdominal pain. No gross hematuria  Past Medical History  Diagnosis Date  . OSA (obstructive sleep apnea)     can't tol. a CPAP  . UTI (lower urinary tract infection)     in the 80s and 11/09  . Fatty liver 02/2009    per u/s  . DIABETES MELLITUS, TYPE II 02/26/2007  . HTN (hypertension)   . GERD (gastroesophageal reflux disease)   . CAD (coronary artery disease)     dr Mayford Knife, [place] 2001 (stents x 2), syncope 2010  . Plantar fasciitis of left foot     Past Surgical History  Procedure Laterality Date  . Septoplasty      w/ bilateral inferior turbinate reductions  . Tonsillectomy      History   Social History  . Marital Status: Married    Spouse Name: N/A  . Number of Children: 2  . Years of Education: N/A   Occupational History  . Vice Biomedical engineer at [employer]    Social History Main Topics  . Smoking status: Former Games developer  . Smokeless tobacco: Never Used     Comment: 2001  . Alcohol Use: 0.0 oz/week     Comment: socially  . Drug Use: No    . Sexual Activity: Not on file   Other Topics Concern  . Not on file   Social History Narrative   Lives in [place], [place]   Lives w/ wife, 2 children, 2 g-kids                    Medication List       This list is accurate as of: 01/06/15 11:59 PM.  Always use your most recent med list.               aspirin 325 MG tablet  Take 325 mg by mouth daily.     clopidogrel 75 MG tablet  Commonly known as:  PLAVIX  Take 1 tablet (75 mg total) by mouth daily.     ezetimibe-simvastatin 10-10 MG per tablet  Commonly known as:  VYTORIN  Take 1 tablet by mouth daily.     fluticasone 50 MCG/ACT nasal spray  Commonly known as:  FLONASE  Place 2 sprays into both nostrils daily.     glipiZIDE 5 MG 24 hr tablet  Commonly known as:  GLIPIZIDE XL  Take 1 tablet (5 mg total) by mouth 2 (two) times daily.     lisinopril-hydrochlorothiazide 20-12.5 MG per tablet  Commonly known as:  PRINZIDE,ZESTORETIC  Take  1 tablet by mouth daily.     multivitamin tablet  Take 1 tablet by mouth daily.     nitroGLYCERIN 0.4 MG SL tablet  Commonly known as:  NITROSTAT  Place 1 tablet (0.4 mg total) under the tongue every 5 (five) minutes as needed for chest pain.     pantoprazole 40 MG tablet  Commonly known as:  PROTONIX  Take 40 mg by mouth 2 (two) times daily.     pioglitazone-metformin 15-850 MG per tablet  Commonly known as:  ACTOPLUS MET  Take 1 tablet by mouth 2 (two) times daily with a meal.           Objective:   Physical Exam BP 132/86 mmHg  Pulse 67  Temp(Src) 98.2 F (36.8 C) (Oral)  Ht $R'5\' 10"'YS$  (1.778 m)  Wt 280 lb 6 oz (127.177 kg)  BMI 40.23 kg/m2  SpO2 97%  General:   Well developed, well nourished . NAD.  HEENT:  Normocephalic . Face symmetric, atraumatic Lungs:  CTA B Normal respiratory effort, no intercostal retractions, no accessory muscle use. Heart: RRR,  no murmur.  Diabetic foot exam: No edema, good pedal pulses, skin normal, pinprick examination  normal Neurologic:  alert & oriented X3.  Speech normal, gait appropriate for age and unassisted Psych--  Cognition and judgment appear intact.  Cooperative with normal attention span and concentration.  Behavior appropriate. No anxious or depressed appearing.       Assessment & Plan:      Decreased sense of smell, unchanged, we talk about possibly a referral but we agreed on observation. Mild anemia. Last CBC, recheck a CBC. Asymptomatic from the GI standpoint, update on his colonoscopies

## 2015-01-06 NOTE — Patient Instructions (Signed)
Get your blood work before you leave     Two great books to learn about diabetes Diabetes for Dummies "The [facility] Diabetes diet" book    Come back to the office in 4 months   for a routine check up

## 2015-01-06 NOTE — Assessment & Plan Note (Signed)
The patient is asymptomatic, plan is to control his cardiovascular risk factors, continue aspirin and Plavix. Due to see cardiology, will arrange a referral

## 2015-01-06 NOTE — Progress Notes (Signed)
Pre visit review using our clinic review tool, if applicable. No additional management support is needed unless otherwise documented below in the visit note. 

## 2015-01-06 NOTE — Assessment & Plan Note (Addendum)
We again discussed diet and exercise, previous A1c and goals discussed. Feet exam today negative Plan: A1c, continue present care, consider add a medication, improved diet and exercise. See instructions.

## 2015-01-06 NOTE — Assessment & Plan Note (Signed)
We'll control, recent BMP satisfactory, no change

## 2015-01-07 LAB — COMPREHENSIVE METABOLIC PANEL
ALK PHOS: 60 U/L (ref 39–117)
ALT: 14 U/L (ref 0–53)
AST: 20 U/L (ref 0–37)
Albumin: 3.8 g/dL (ref 3.5–5.2)
BUN: 13 mg/dL (ref 6–23)
CALCIUM: 8.8 mg/dL (ref 8.4–10.5)
CHLORIDE: 105 meq/L (ref 96–112)
CO2: 22 mEq/L (ref 19–32)
Creat: 0.9 mg/dL (ref 0.50–1.35)
GLUCOSE: 133 mg/dL — AB (ref 70–99)
POTASSIUM: 4.5 meq/L (ref 3.5–5.3)
Sodium: 139 mEq/L (ref 135–145)
Total Bilirubin: 0.3 mg/dL (ref 0.2–1.2)
Total Protein: 6.4 g/dL (ref 6.0–8.3)

## 2015-01-07 LAB — HEMOGLOBIN A1C
HEMOGLOBIN A1C: 7.2 % — AB (ref <5.7)
Mean Plasma Glucose: 160 mg/dL — ABNORMAL HIGH (ref <117)

## 2015-01-08 DIAGNOSIS — R0609 Other forms of dyspnea: Secondary | ICD-10-CM

## 2015-01-08 NOTE — Assessment & Plan Note (Signed)
Improving, likely deconditioning. "the more I walk , the less short of breath I get".

## 2015-01-09 ENCOUNTER — Telehealth: Payer: Self-pay | Admitting: Internal Medicine

## 2015-01-09 MED ORDER — SITAGLIPTIN PHOSPHATE 100 MG PO TABS: 100.0000 mg | ORAL_TABLET | Freq: Every day | ORAL | Status: DC

## 2015-01-09 NOTE — Addendum Note (Signed)
Addended by: Wilfrid Lund on: 01/09/2015 10:18 AM   Modules accepted: Orders

## 2015-01-09 NOTE — Telephone Encounter (Signed)
Spoke with Pt, informed him not to d/c any medications that he is adding Januvia. Pt verbalized understanding.

## 2015-01-09 NOTE — Telephone Encounter (Signed)
°  Relation to pt: self  Call back number: [phone]   Reason for call:  Pt in need of clinical advice regarding sitaGLIPtin (JANUVIA) 100 MG tablet  And would like to know what other medication should D/C please advise

## 2015-03-09 NOTE — Progress Notes (Signed)
Cardiology Office Note   Date:  03/10/2015   ID:  Daniel Cruz, DOB 09/03/1951, MRN [id number]  PCP:  Kathlene November, MD    Chief Complaint  Patient presents with  . Annual Exam    CAD, HTN, dyslipidemia      History of Present Illness: Daniel Cruz is a 64 y.o. male with previous coronary artery disease, multiple PCI last in 2010 to the LAD/RCA, diabetes, hypertension, hyperlipidemia, obstructive sleep apnea and GERD who presents for followup.  The last time I saw him he had some indigestion problems which have resolved.  He is doing well.  He denies any chest pain, SOB, DOE (escept when going up stairs which is not new), LE edema, dizziness, palpitations or syncope.   Past Medical History  Diagnosis Date  . OSA (obstructive sleep apnea)     can't tol. a CPAP  . UTI (lower urinary tract infection)     in the 80s and 11/09  . Fatty liver 02/2009    per u/s  . DIABETES MELLITUS, TYPE II 02/26/2007  . HTN (hypertension)   . GERD (gastroesophageal reflux disease)   . CAD (coronary artery disease)     dr Radford Pax, [place] 2001 (stents x 2), syncope 2010  . Plantar fasciitis of left foot     Past Surgical History  Procedure Laterality Date  . Septoplasty      w/ bilateral inferior turbinate reductions  . Tonsillectomy       Current Outpatient Prescriptions  Medication Sig Dispense Refill  . aspirin 325 MG tablet Take 325 mg by mouth daily.    . clopidogrel (PLAVIX) 75 MG tablet Take 1 tablet (75 mg total) by mouth daily. 90 tablet 1  . ezetimibe-simvastatin (VYTORIN) 10-10 MG per tablet Take 1 tablet by mouth daily. 90 tablet 1  . glipiZIDE (GLIPIZIDE XL) 5 MG 24 hr tablet Take 1 tablet (5 mg total) by mouth 2 (two) times daily. 180 tablet 1  . lisinopril-hydrochlorothiazide (PRINZIDE,ZESTORETIC) 20-12.5 MG per tablet Take 1 tablet by mouth daily. 90 tablet 1  . Multiple Vitamin (MULTIVITAMIN) tablet Take 1 tablet by mouth daily.      . nitroGLYCERIN  (NITROSTAT) 0.4 MG SL tablet Place 1 tablet (0.4 mg total) under the tongue every 5 (five) minutes as needed for chest pain. 30 tablet 3  . pantoprazole (PROTONIX) 40 MG tablet Take 40 mg by mouth 2 (two) times daily.    . pioglitazone-metformin (ACTOPLUS MET) 15-850 MG per tablet Take 1 tablet by mouth 2 (two) times daily with a meal. 180 tablet 1  . sitaGLIPtin (JANUVIA) 100 MG tablet Take 1 tablet (100 mg total) by mouth daily. 90 tablet 1   No current facility-administered medications for this visit.    Allergies:   Review of patient's allergies indicates no known allergies.    Social History:  The patient  reports that he has quit smoking. He has never used smokeless tobacco. He reports that he drinks alcohol. He reports that he does not use illicit drugs.   Family History:  The patient's family history includes COPD in his mother; Coronary artery disease in his father; Emphysema in his mother; Stroke in his father. There is no history of Cancer or Diabetes.    ROS:  Please see the history of present illness.   Otherwise, review of systems are positive for none.   All other systems  are reviewed and negative.    PHYSICAL EXAM: VS:  BP 110/74 mmHg  Pulse 62  Ht $R'5\' 10"'wx$  (1.778 m)  Wt 276 lb 12.8 oz (125.556 kg)  BMI 39.72 kg/m2 , BMI Body mass index is 39.72 kg/(m^2). GEN: Well nourished, well developed, in no acute distress HEENT: normal Neck: no JVD, carotid bruits, or masses Cardiac: RRR; no murmurs, rubs, or gallops,no edema  Respiratory:  clear to auscultation bilaterally, normal work of breathing GI: soft, nontender, nondistended, + BS MS: no deformity or atrophy Skin: warm and dry, no rash Neuro:  Strength and sensation are intact Psych: euthymic mood, full affect   EKG:  EKG was ordered today and showed NSR with no ST changes    Recent Labs: 08/24/2014: Hemoglobin 11.7*; Platelets 213.0; TSH 0.74 01/06/2015: ALT 14; BUN 13; Creatinine 0.90; Potassium 4.5; Sodium 139      Lipid Panel    Component Value Date/Time   CHOL 116 08/24/2014 1415   TRIG 42.0 08/24/2014 1415   TRIG 106 09/15/2006 0947   HDL 53.90 08/24/2014 1415   CHOLHDL 2 08/24/2014 1415   CHOLHDL 3.0 CALC 09/15/2006 0947   VLDL 8.4 08/24/2014 1415   LDLCALC 54 08/24/2014 1415      Wt Readings from Last 3 Encounters:  03/10/15 276 lb 12.8 oz (125.556 kg)  01/06/15 280 lb 6 oz (127.177 kg)  11/02/14 273 lb 12.8 oz (124.195 kg)    ASSESSMENT AND PLAN:  1. Indigestion that has significantly improved after increasing protonix. - continue Protonix 2. ASCAD with no exertional chest pain - continue ASA/Plavix 3. HTN - controlled - continue Lisinopril HCT 4. Dyslipidemia - at goal at last check - continue Vytorin  - recheck FLP and ALT 5. Gallbladder polyp 6. Fatty liver - I have encouraged him to try to increase his walking and follow a low fat low carb diet with portion control    Current medicines are reviewed at length with the patient today.  The patient does not have concerns regarding medicines.  The following changes have been made:  no change  Labs/ tests ordered today: See above Assessment and Plan No orders of the defined types were placed in this encounter.     Disposition:   FU with me in 1 year and extender in 6 months  Signed, Sueanne Margarita, MD  03/10/2015 8:27 AM    [facility] [address], [place], [place]  [address] Phone: [phone]; Fax: [phone]

## 2015-03-10 ENCOUNTER — Encounter: Payer: Self-pay | Admitting: Cardiology

## 2015-03-10 ENCOUNTER — Ambulatory Visit (INDEPENDENT_AMBULATORY_CARE_PROVIDER_SITE_OTHER): Payer: BLUE CROSS/BLUE SHIELD | Admitting: Cardiology

## 2015-03-10 VITALS — BP 110/74 | HR 62 | Ht 70.0 in | Wt 276.8 lb

## 2015-03-10 DIAGNOSIS — E785 Hyperlipidemia, unspecified: Secondary | ICD-10-CM

## 2015-03-10 DIAGNOSIS — I10 Essential (primary) hypertension: Secondary | ICD-10-CM

## 2015-03-10 DIAGNOSIS — I2510 Atherosclerotic heart disease of native coronary artery without angina pectoris: Secondary | ICD-10-CM

## 2015-03-10 LAB — HEPATIC FUNCTION PANEL
ALK PHOS: 52 U/L (ref 39–117)
ALT: 16 U/L (ref 0–53)
AST: 21 U/L (ref 0–37)
Albumin: 3.9 g/dL (ref 3.5–5.2)
BILIRUBIN DIRECT: 0.1 mg/dL (ref 0.0–0.3)
Total Bilirubin: 0.3 mg/dL (ref 0.2–1.2)
Total Protein: 6.8 g/dL (ref 6.0–8.3)

## 2015-03-10 LAB — LIPID PANEL
Cholesterol: 102 mg/dL (ref 0–200)
HDL: 49 mg/dL (ref >39.00)
LDL Cholesterol: 43 mg/dL (ref 0–99)
NonHDL: 53
Total CHOL/HDL Ratio: 2
Triglycerides: 49 mg/dL (ref 0.0–149.0)
VLDL: 9.8 mg/dL (ref 0.0–40.0)

## 2015-03-10 NOTE — Patient Instructions (Signed)
Medication Instructions:  Your physician recommends that you continue on your current medications as directed. Please refer to the Current Medication list given to you today.   Labwork: TODAY: LFTs, Lipids  Testing/Procedures: None  Follow-Up: Your physician wants you to follow-up in: 6 months with Richardson Dopp. You will receive a reminder letter in the mail two months in advance. If you don't receive a letter, please call our office to schedule the follow-up appointment.   Your physician wants you to follow-up in: 1 year with Dr. Radford Pax. You will receive a reminder letter in the mail two months in advance. If you don't receive a letter, please call our office to schedule the follow-up appointment.   Any Other Special Instructions Will Be Listed Below (If Applicable).

## 2015-04-03 ENCOUNTER — Other Ambulatory Visit: Payer: Self-pay

## 2015-04-25 ENCOUNTER — Encounter: Payer: Self-pay | Admitting: Internal Medicine

## 2015-04-25 MED ORDER — LINAGLIPTIN 5 MG PO TABS: 5.0000 mg | ORAL_TABLET | Freq: Every day | ORAL | Status: DC

## 2015-04-25 NOTE — Telephone Encounter (Signed)
-----   Message from Colon Branch, MD sent at 04/25/2015 12:22 PM EDT ----- Regarding: rx See message DC Januvia Send a prescription for tradjenta 5 mg 1 by mouth daily #30 and 6 refills

## 2015-04-25 NOTE — Telephone Encounter (Signed)
Januvia d/c, Tradjenta 5 mg 1 tablet daily sent to [facility].

## 2015-05-08 ENCOUNTER — Ambulatory Visit (INDEPENDENT_AMBULATORY_CARE_PROVIDER_SITE_OTHER): Payer: BLUE CROSS/BLUE SHIELD | Admitting: Internal Medicine

## 2015-05-08 ENCOUNTER — Encounter: Payer: Self-pay | Admitting: Internal Medicine

## 2015-05-08 VITALS — BP 122/68 | HR 77 | Temp 97.9°F | Ht 70.0 in | Wt 272.5 lb

## 2015-05-08 DIAGNOSIS — D649 Anemia, unspecified: Secondary | ICD-10-CM | POA: Diagnosis not present

## 2015-05-08 DIAGNOSIS — E119 Type 2 diabetes mellitus without complications: Secondary | ICD-10-CM

## 2015-05-08 DIAGNOSIS — I2510 Atherosclerotic heart disease of native coronary artery without angina pectoris: Secondary | ICD-10-CM

## 2015-05-08 LAB — CBC WITH DIFFERENTIAL/PLATELET
BASOS PCT: 0.5 % (ref 0.0–3.0)
Basophils Absolute: 0 10*3/uL (ref 0.0–0.1)
EOS ABS: 0.1 10*3/uL (ref 0.0–0.7)
EOS PCT: 1.2 % (ref 0.0–5.0)
HCT: 37.7 % — ABNORMAL LOW (ref 39.0–52.0)
HEMOGLOBIN: 12.5 g/dL — AB (ref 13.0–17.0)
Lymphocytes Relative: 16.9 % (ref 12.0–46.0)
Lymphs Abs: 1.1 10*3/uL (ref 0.7–4.0)
MCHC: 33.2 g/dL (ref 30.0–36.0)
MCV: 90.1 fl (ref 78.0–100.0)
Monocytes Absolute: 0.6 10*3/uL (ref 0.1–1.0)
Monocytes Relative: 8.9 % (ref 3.0–12.0)
NEUTROS PCT: 72.5 % (ref 43.0–77.0)
Neutro Abs: 4.5 10*3/uL (ref 1.4–7.7)
Platelets: 191 10*3/uL (ref 150.0–400.0)
RBC: 4.19 Mil/uL — AB (ref 4.22–5.81)
RDW: 17 % — AB (ref 11.5–15.5)
WBC: 6.2 10*3/uL (ref 4.0–10.5)

## 2015-05-08 LAB — FOLATE: Folate: 14.2 ng/mL (ref >5.9)

## 2015-05-08 LAB — IRON: IRON: 72 ug/dL (ref 42–165)

## 2015-05-08 LAB — FERRITIN: Ferritin: 5.7 ng/mL — ABNORMAL LOW (ref 22.0–322.0)

## 2015-05-08 LAB — HM DIABETES FOOT EXAM: HM DIABETIC FOOT EXAM: NORMAL

## 2015-05-08 LAB — VITAMIN B12: VITAMIN B 12: 284 pg/mL (ref 211–911)

## 2015-05-08 NOTE — Assessment & Plan Note (Signed)
Mild chronic anemia, recheck a CBC, iron, ferritin and vitamins. GI review systems negative. Up-to-date on screening colonoscopies

## 2015-05-08 NOTE — Assessment & Plan Note (Addendum)
Since the last office visit, he tried [place] but had side effects, currently on Tradjenta, doing well. He is doing better with exercise. Feet exam normal today. Last eye checkup a year ago, has an appointment to see the eye doctor in few days Plan: Continue with present care, recheck a A1c on return to the office

## 2015-05-08 NOTE — Progress Notes (Signed)
Subjective:    Patient ID: Daniel Cruz, male    DOB: 11-06-1950, 64 y.o.   MRN: [id number]  DOS:  05/08/2015 Type of visit - description : Routine office visit Interval history: In general doing well, good compliance of medications including tradjenta which is not causing side effects.    Review of Systems  Denies chest pain or difficulty breathing. In the past he complained of DOE, symptoms are decreasing, he is actually more active and sometimes jogging No nausea, vomiting, diarrhea or blood in the stools. No GERD symptoms. No lower extremity paresthesias.  Past Medical History  Diagnosis Date  . OSA (obstructive sleep apnea)     can't tol. a CPAP  . UTI (lower urinary tract infection)     in the 80s and 11/09  . Fatty liver 02/2009    per u/s  . DIABETES MELLITUS, TYPE II 02/26/2007  . HTN (hypertension)   . GERD (gastroesophageal reflux disease)   . CAD (coronary artery disease)     dr Radford Pax, [place] 2001 (stents x 2), syncope 2010  . Plantar fasciitis of left foot     Past Surgical History  Procedure Laterality Date  . Septoplasty      w/ bilateral inferior turbinate reductions  . Tonsillectomy      History   Social History  . Marital Status: Married    Spouse Name: N/A  . Number of Children: 2  . Years of Education: N/A   Occupational History  . 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 Smoking status: Former Research scientist (life sciences)  . Smokeless tobacco: Never Used     Comment: 2001  . Alcohol Use: 0.0 oz/week     Comment: socially  . Drug Use: No  . Sexual Activity: Not on file   Other Topics Concern  . Not on file   Social History Narrative   Lives in [place], [place]   Lives w/ wife, 2 children, 2 g-kids                    Medication List       This list is accurate as of: 05/08/15 12:56 PM.  Always use your most recent med list.               aspirin 325 MG tablet  Take 325 mg by mouth  daily.     clopidogrel 75 MG tablet  Commonly known as:  PLAVIX  Take 1 tablet (75 mg total) by mouth daily.     ezetimibe-simvastatin 10-10 MG per tablet  Commonly known as:  VYTORIN  Take 1 tablet by mouth daily.     glipiZIDE 5 MG 24 hr tablet  Commonly known as:  GLIPIZIDE XL  Take 1 tablet (5 mg total) by mouth 2 (two) times daily.     linagliptin 5 MG Tabs tablet  Commonly known as:  TRADJENTA  Take 1 tablet (5 mg total) by mouth daily.     lisinopril-hydrochlorothiazide 20-12.5 MG per tablet  Commonly known as:  PRINZIDE,ZESTORETIC  Take 1 tablet by mouth daily.     multivitamin tablet  Take 1 tablet by mouth daily.     nitroGLYCERIN 0.4 MG SL tablet  Commonly known as:  NITROSTAT  Place 1 tablet (0.4 mg total) under the tongue every 5 (five) minutes as needed for chest pain.     pantoprazole 40 MG tablet  Commonly known as:  PROTONIX  Take 40 mg by mouth 2 (two) times daily.     pioglitazone-metformin 15-850 MG per tablet  Commonly known as:  ACTOPLUS MET  Take 1 tablet by mouth 2 (two) times daily with a meal.           Objective:   Physical Exam BP 122/68 mmHg  Pulse 77  Temp(Src) 97.9 F (36.6 C) (Oral)  Ht _0  (1.778 m)  Wt 272 lb 8 oz (123.605 kg)  BMI 39.10 kg/m2  SpO2 97% General:   Well developed, well nourished . NAD.  HEENT:  Normocephalic . Face symmetric, atraumatic Lungs:  CTA B Normal respiratory effort, no intercostal retractions, no accessory muscle use. Heart: RRR,  no murmur.  No pretibial edema bilaterally  Diabetic foot exam: Good pulses, normal pinprick examination, no edema, skin normal. Neurologic:  alert & oriented X3.  Speech normal, gait appropriate for age and unassisted Psych--  Cognition and judgment appear intact.  Cooperative with normal attention span and concentration.  Behavior appropriate. No anxious or depressed appearing.      Assessment & Plan:

## 2015-05-08 NOTE — Assessment & Plan Note (Addendum)
Recently saw cardiology, note reviewed, felt to be stable

## 2015-05-08 NOTE — Patient Instructions (Signed)
Get your blood work before you leave    

## 2015-05-08 NOTE — Progress Notes (Signed)
Pre visit review using our clinic review tool, if applicable. No additional management support is needed unless otherwise documented below in the visit note. 

## 2015-05-10 ENCOUNTER — Encounter: Payer: Self-pay | Admitting: Internal Medicine

## 2015-05-10 ENCOUNTER — Other Ambulatory Visit: Payer: Self-pay

## 2015-05-10 MED ORDER — LINAGLIPTIN 5 MG PO TABS: 5.0000 mg | ORAL_TABLET | Freq: Every day | ORAL | Status: DC

## 2015-05-18 LAB — HM DIABETES EYE EXAM

## 2015-06-18 ENCOUNTER — Other Ambulatory Visit: Payer: Self-pay | Admitting: Internal Medicine

## 2015-06-19 NOTE — Telephone Encounter (Signed)
Pt called to f/u on refill requested by [facility]. Needing refills on lisinopril and vytorin. Has 2 weeks on hand. This is going to mail order. Needs 90 day supply. Please have with refills.

## 2015-07-12 ENCOUNTER — Other Ambulatory Visit: Payer: Self-pay | Admitting: Internal Medicine

## 2015-07-16 ENCOUNTER — Other Ambulatory Visit: Payer: Self-pay | Admitting: Internal Medicine

## 2015-09-05 ENCOUNTER — Encounter: Payer: BLUE CROSS/BLUE SHIELD | Admitting: Internal Medicine

## 2015-09-06 NOTE — Progress Notes (Signed)
Cardiology Office Note   Date:  09/07/2015   ID:  Daniel Cruz, DOB 1950/12/14, MRN [id number]   Patient Care Team: Colon Branch, MD as PCP - General (Internal Medicine) Marica Otter, OD as Consulting Physician (Optometry) Sueanne Margarita, MD as Consulting Physician (Cardiology)    Chief Complaint  Patient presents with  . Follow-up  . Coronary Artery Disease     History of Present Illness: Daniel Cruz is a 64 y.o. male with a hx of CAD (MI in 2001 treated with BMS x 2 to the RCA and apparent stenting to the LAD in 2010-report not available), diabetes, HTN, HL, OSA, GERD. Last seen by Dr. Radford Pax 6/16. He returns for routine follow-up.  He is doing very well. The patient denies chest pain, shortness of breath, syncope, orthopnea, PND or significant pedal edema. Recent plantar fasciitis improved and he has returned to exercise and feels good.    Studies/Reports Reviewed Today:  Abdominal US 11/15 No AAA  Myoview 07/23/13 Low risk stress nuclear study There is a fixed defect in the inferior wall most consistent with diaphragmatic attenuation but cannot rule out infarct since gated imates were not performed due to PVC's.  LV Ejection Fraction: Study not gated. LV Wall Motion: Study not gated  LHC 09/2000 LM: Okay LAD: Proximal 50-60%, diffuse 50%, D2 patent LCx: Patent RCA: Occluded EF 55% PCI: BMS 2 to the RCA  Past Medical History  Diagnosis Date  . OSA (obstructive sleep apnea)     can't tol. a CPAP  . UTI (lower urinary tract infection)     in the 80s and 11/09  . Fatty liver 02/2009    per u/s  . DIABETES MELLITUS, TYPE II 02/26/2007  . HTN (hypertension)   . GERD (gastroesophageal reflux disease)   . CAD (coronary artery disease)     dr Radford Pax, MI 2001 (stents x 2), syncope 2010  . Plantar fasciitis of left foot     Past Surgical History  Procedure Laterality Date  . Septoplasty      w/ bilateral inferior turbinate reductions  . Tonsillectomy        Current Outpatient Prescriptions  Medication Sig Dispense Refill  . aspirin 325 MG tablet Take 325 mg by mouth daily.    . clopidogrel (PLAVIX) 75 MG tablet Take 1 tablet (75 mg total) by mouth daily. 90 tablet 2  . ezetimibe-simvastatin (VYTORIN) 10-10 MG per tablet Take 1 tablet by mouth daily. 90 tablet 1  . glipiZIDE (GLIPIZIDE XL) 5 MG 24 hr tablet Take 1 tablet (5 mg total) by mouth 2 (two) times daily. 180 tablet 1  . linagliptin (TRADJENTA) 5 MG TABS tablet Take 1 tablet (5 mg total) by mouth daily. 90 tablet 1  . lisinopril-hydrochlorothiazide (PRINZIDE,ZESTORETIC) 20-12.5 MG per tablet Take 1 tablet by mouth daily. 90 tablet 1  . Multiple Vitamin (MULTIVITAMIN) tablet Take 1 tablet by mouth daily.      . nitroGLYCERIN (NITROSTAT) 0.4 MG SL tablet Place 1 tablet (0.4 mg total) under the tongue every 5 (five) minutes as needed for chest pain. 30 tablet 3  . pantoprazole (PROTONIX) 40 MG tablet Take 40 mg by mouth 2 (two) times daily.    . pioglitazone-metformin (ACTOPLUS MET) 15-850 MG per tablet Take 1 tablet by mouth 2 (two) times daily with a meal. 180 tablet 1   No current facility-administered medications for this visit.    Allergies:   Januvia    Social History:  Social History   Social History  . Marital Status: Married    Spouse Name: N/A  . Number of Children: 2  . Years of Education: N/A   Occupational History  . 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 Smoking status: Former Research scientist (life sciences)  . Smokeless tobacco: Never Used     Comment: 2001  . Alcohol Use: 0.0 oz/week     Comment: socially  . Drug Use: No  . Sexual Activity: Not Asked   Other Topics Concern  . None   Social History Narrative   Lives in [place], [place]   Lives w/ wife, 2 children, 2 g-kids                 Family History:   Family History  Problem Relation Age of Onset  . Stroke Father   . Coronary artery disease Father   .  Emphysema Mother     died  . Cancer Neg Hx     colon ,prostate  . Diabetes Neg Hx   . COPD Mother       ROS:   Please see the history of present illness.   Review of Systems  All other systems reviewed and are negative.     PHYSICAL EXAM: VS:  BP 100/70 mmHg  Pulse 63  Ht _0  (1.803 m)  Wt 268 lb 12.8 oz (121.927 kg)  BMI 37.51 kg/m2    Wt Readings from Last 3 Encounters:  09/07/15 268 lb 12.8 oz (121.927 kg)  05/08/15 272 lb 8 oz (123.605 kg)  03/10/15 276 lb 12.8 oz (125.556 kg)     GEN: Well nourished, well developed, in no acute distress HEENT: normal Neck: no JVD, no carotid bruits, no masses Cardiac:  Normal S1/S2, RRR; no murmur ,  no rubs or gallops, trace bilat LE edema   Respiratory:  clear to auscultation bilaterally, no wheezing, rhonchi or rales. GI: soft, nontender, nondistended, + BS MS: no deformity or atrophy Skin: warm and dry  Neuro:  CNs II-XII intact, Strength and sensation are intact Psych: Normal affect   EKG:  EKG is ordered today.  It demonstrates:   NSR, HR 63, QTc 440 ms, freq PVCs   Recent Labs: 03/10/2015: ALT 16 05/08/2015: Hemoglobin 12.5*; Platelets 191.0 09/07/2015: BUN 22; Creat 0.96; Magnesium 1.9; Potassium 4.1; Sodium 139    Lipid Panel    Component Value Date/Time   CHOL 102 03/10/2015 0842   TRIG 49.0 03/10/2015 0842   TRIG 106 09/15/2006 0947   HDL 49.00 03/10/2015 0842   CHOLHDL 2 03/10/2015 0842   CHOLHDL 3.0 CALC 09/15/2006 0947   VLDL 9.8 03/10/2015 0842   LDLCALC 43 03/10/2015 0842      ASSESSMENT AND PLAN:  1. CAD:  Prior MI in 2001 with BMS to RCA x 2 and apparent stenting to the LAD in 2010 after an episode of syncope initially evaluated at outside hospital [facility]).  Last nuc study in 2014 low risk.  No angina.  Continue ASA, Plavix, statin.  2. HTN:  Controlled.   3. Hyperlipidemia:  Recent Lipids at goal.  Continue current Rx.   4. PVCs:  Asymptomatic.  Check BMET, Mg2+ today.        Medication Changes: Current medicines are reviewed at length with the patient today.  Concerns regarding medicines are as outlined above.  The following changes have been made:   Discontinued Medications   No medications on  file   Modified Medications   No medications on file   New Prescriptions   No medications on file   Labs/ tests ordered today include:   Orders Placed This Encounter  Procedures  . Basic Metabolic Panel (BMET)  . Magnesium  . EKG 12-Lead     Disposition:    FU with Dr. Fransico Him 6 mos.     Signed, Versie Starks, MHS 09/07/2015 5:26 PM    [facility] [address], [place], [place]  [address] Phone: [phone]; Fax: [phone]

## 2015-09-07 ENCOUNTER — Telehealth: Payer: Self-pay | Admitting: *Deleted

## 2015-09-07 ENCOUNTER — Ambulatory Visit (INDEPENDENT_AMBULATORY_CARE_PROVIDER_SITE_OTHER): Payer: BLUE CROSS/BLUE SHIELD | Admitting: Physician Assistant

## 2015-09-07 ENCOUNTER — Encounter: Payer: Self-pay | Admitting: Physician Assistant

## 2015-09-07 VITALS — BP 100/70 | HR 63 | Ht 71.0 in | Wt 268.8 lb

## 2015-09-07 DIAGNOSIS — I493 Ventricular premature depolarization: Secondary | ICD-10-CM

## 2015-09-07 DIAGNOSIS — I2510 Atherosclerotic heart disease of native coronary artery without angina pectoris: Secondary | ICD-10-CM | POA: Diagnosis not present

## 2015-09-07 DIAGNOSIS — I10 Essential (primary) hypertension: Secondary | ICD-10-CM

## 2015-09-07 DIAGNOSIS — E785 Hyperlipidemia, unspecified: Secondary | ICD-10-CM | POA: Diagnosis not present

## 2015-09-07 LAB — BASIC METABOLIC PANEL
BUN: 22 mg/dL (ref 7–25)
CHLORIDE: 106 mmol/L (ref 98–110)
CO2: 24 mmol/L (ref 20–31)
Calcium: 8.9 mg/dL (ref 8.6–10.3)
Creat: 0.96 mg/dL (ref 0.70–1.25)
Glucose, Bld: 116 mg/dL — ABNORMAL HIGH (ref 65–99)
Potassium: 4.1 mmol/L (ref 3.5–5.3)
Sodium: 139 mmol/L (ref 135–146)

## 2015-09-07 LAB — MAGNESIUM: Magnesium: 1.9 mg/dL (ref 1.5–2.5)

## 2015-09-07 NOTE — Telephone Encounter (Signed)
Pt notified of lab results by phone with verbal understanding.  

## 2015-09-07 NOTE — Patient Instructions (Signed)
Medication Instructions:  Your physician recommends that you continue on your current medications as directed. Please refer to the Current Medication list given to you today.   Labwork: TODAY BMET, MAGNESIUM LEVEL  Testing/Procedures: NONE  Follow-Up: Your physician wants you to follow-up in: 6 MONTHS WITH DR. Mallie Snooks will receive a reminder letter in the mail two months in advance. If you don't receive a letter, please call our office to schedule the follow-up appointment.   Any Other Special Instructions Will Be Listed Below (If Applicable).   If you need a refill on your cardiac medications before your next appointment, please call your pharmacy.

## 2015-09-14 ENCOUNTER — Telehealth: Payer: Self-pay | Admitting: Internal Medicine

## 2015-09-15 ENCOUNTER — Ambulatory Visit (INDEPENDENT_AMBULATORY_CARE_PROVIDER_SITE_OTHER): Payer: BLUE CROSS/BLUE SHIELD | Admitting: Behavioral Health

## 2015-09-15 DIAGNOSIS — Z23 Encounter for immunization: Secondary | ICD-10-CM

## 2015-09-15 NOTE — Progress Notes (Signed)
Pre visit review using our clinic review tool, if applicable. No additional management support is needed unless otherwise documented below in the visit note. 

## 2015-10-01 ENCOUNTER — Other Ambulatory Visit: Payer: Self-pay | Admitting: Internal Medicine

## 2015-10-19 ENCOUNTER — Other Ambulatory Visit: Payer: Self-pay | Admitting: Internal Medicine

## 2015-11-01 NOTE — Telephone Encounter (Signed)
flu

## 2015-11-20 ENCOUNTER — Ambulatory Visit (INDEPENDENT_AMBULATORY_CARE_PROVIDER_SITE_OTHER): Payer: BLUE CROSS/BLUE SHIELD | Admitting: Internal Medicine

## 2015-11-20 ENCOUNTER — Encounter: Payer: Self-pay | Admitting: Internal Medicine

## 2015-11-20 VITALS — BP 126/78 | HR 67 | Temp 97.9°F | Ht 71.0 in | Wt 274.0 lb

## 2015-11-20 DIAGNOSIS — Z09 Encounter for follow-up examination after completed treatment for conditions other than malignant neoplasm: Secondary | ICD-10-CM

## 2015-11-20 DIAGNOSIS — J209 Acute bronchitis, unspecified: Secondary | ICD-10-CM

## 2015-11-20 MED ORDER — HYDROCODONE-HOMATROPINE 5-1.5 MG/5ML PO SYRP: 5.0000 mL | ORAL_SOLUTION | Freq: Every evening | ORAL | Status: DC | PRN

## 2015-11-20 MED ORDER — AZITHROMYCIN 250 MG PO TABS: ORAL_TABLET | ORAL | Status: DC

## 2015-11-20 NOTE — Progress Notes (Signed)
Subjective:    Patient ID: Daniel Cruz, male    DOB: [birth year], 65 y.o.   MRN: [id number]  DOS:  11/20/2015 Type of visit - description : Acute visit Interval history: Symptoms started 4 weeks ago with URI: Head congestion, sore throat described as a raw feeling. Last week  "it went to the chest", developed more cough, unable to sleep due to cough. OTCs not helping much  Review of Systems No fever chills Sinuses still congested, very small amount of nasal discharge. Mild amount of sputum production, yellow in color. No hemoptysis No substernal chest pain  Past Medical History  Diagnosis Date  . OSA (obstructive sleep apnea)     can't tol. a CPAP  . UTI (lower urinary tract infection)     in the 80s and 11/09  . Fatty liver 02/2009    per u/s  . DIABETES MELLITUS, TYPE II 02/26/2007  . HTN (hypertension)   . GERD (gastroesophageal reflux disease)   . CAD (coronary artery disease)     dr Radford Pax, MI 2001 (stents x 2), syncope 2010  . Plantar fasciitis of left foot     Past Surgical History  Procedure Laterality Date  . Septoplasty      w/ bilateral inferior turbinate reductions  . Tonsillectomy      Social History   Social History  . Marital Status: Married    Spouse Name: N/A  . Number of Children: 2  . Years of Education: N/A   Occupational History  . 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 Smokeless tobacco: Never Used     Comment: 2001  . Alcohol Use: 0.0 oz/week     Comment: socially  . Drug Use: No  . Sexual Activity: Not on file   Other Topics Concern  . Not on file   Social History Narrative   Lives in [place], [place]   Lives w/ wife, 2 children, 2 g-kids                    Medication List       This list is accurate as of: 11/20/15  1:02 PM.  Always use your most recent med list.               aspirin 325 MG tablet  Take 325 mg by mouth  daily.     azithromycin 250 MG tablet  Commonly known as:  ZITHROMAX Z-PAK  2 tabs a day the first day, then 1 tab a day x 4 days     clopidogrel 75 MG tablet  Commonly known as:  PLAVIX  Take 1 tablet (75 mg total) by mouth daily.     ezetimibe-simvastatin 10-10 MG tablet  Commonly known as:  VYTORIN  Take 1 tablet by mouth daily.     glipiZIDE 5 MG 24 hr tablet  Commonly known as:  GLIPIZIDE XL  Take 1 tablet (5 mg total) by mouth 2 (two) times daily.     HYDROcodone-homatropine 5-1.5 MG/5ML syrup  Commonly known as:  HYCODAN  Take 5 mLs by mouth at bedtime as needed for cough.     linagliptin 5 MG Tabs tablet  Commonly known as:  TRADJENTA  Take 1 tablet (5 mg total) by mouth daily.     lisinopril-hydrochlorothiazide 20-12.5 MG tablet  Commonly known as:  PRINZIDE,ZESTORETIC  Take 1 tablet by mouth daily.  multivitamin tablet  Take 1 tablet by mouth daily.     nitroGLYCERIN 0.4 MG SL tablet  Commonly known as:  NITROSTAT  Place 1 tablet (0.4 mg total) under the tongue every 5 (five) minutes as needed for chest pain.     pantoprazole 40 MG tablet  Commonly known as:  PROTONIX  Take 40 mg by mouth 2 (two) times daily.     pioglitazone-metformin 15-850 MG tablet  Commonly known as:  ACTOPLUS MET  Take 1 tablet by mouth 2 (two) times daily with a meal.           Objective:   Physical Exam BP 126/78 mmHg  Pulse 67  Temp(Src) 97.9 F (36.6 C) (Oral)  Ht 5' 11" (1.803 m)  Wt 274 lb (124.286 kg)  BMI 38.23 kg/m2  SpO2 97% General:   Well developed, well nourished . NAD.  HEENT:  Normocephalic . Face symmetric, atraumatic. Nose quite congested, sinuses not be, throat without redness. TMs: Normal Lungs:  CTA B except for a few rhonchi with cough Normal respiratory effort, no intercostal retractions, no accessory muscle use. Heart: RRR,  no murmur.  No pretibial edema bilaterally  Skin: Not pale. Not jaundice Neurologic:  alert & oriented X3.  Speech  normal, gait appropriate for age and unassisted Psych--  Cognition and judgment appear intact.  Cooperative with normal attention span and concentration.  Behavior appropriate. No anxious or depressed appearing.      Assessment & Plan:   Assessment DM DX 2008 HTN Hyperlipidemia CAD: MI 2001, syncope 2010 OSA, CPAP intolerant ED Mild chronic anemia, low iron stores, re-refer to GI 05-2015 GERD Gallbladder polyps, 2014, saw GI, Rx observation Fatty liver 2010   PLAN   Bronchitis: See instructions, Z-Pak. RTC as scheduled next months for a physical

## 2015-11-20 NOTE — Assessment & Plan Note (Signed)
Bronchitis: See instructions, Z-Pak. RTC as scheduled next months for a physical

## 2015-11-20 NOTE — Patient Instructions (Signed)
Rest, fluids , tylenol  For cough:  Take Mucinex DM twice a day as needed until better For peristent night cough-- use hydrocodone, will cause drowsiness   For nasal congestion: Use OTC Nasocort or Flonase : 2 nasal sprays on each side of the nose in the morning until you feel better   Avoid decongestants such as  Pseudoephedrine or phenylephrine     Take the antibiotic as prescribed     Call if not gradually better over the next  10 days  Call anytime if the symptoms are severe

## 2015-11-20 NOTE — Progress Notes (Signed)
Pre visit review using our clinic review tool, if applicable. No additional management support is needed unless otherwise documented below in the visit note. 

## 2015-11-29 ENCOUNTER — Other Ambulatory Visit: Payer: Self-pay | Admitting: Gastroenterology

## 2015-11-29 DIAGNOSIS — K824 Cholesterolosis of gallbladder: Secondary | ICD-10-CM

## 2015-12-07 ENCOUNTER — Ambulatory Visit
Admission: RE | Admit: 2015-12-07 | Discharge: 2015-12-07 | Disposition: A | Discharge status: Home / Self Care | Payer: BLUE CROSS/BLUE SHIELD | Source: Ambulatory Visit | Attending: Gastroenterology | Admitting: Gastroenterology

## 2015-12-07 DIAGNOSIS — K824 Cholesterolosis of gallbladder: Secondary | ICD-10-CM

## 2015-12-14 ENCOUNTER — Other Ambulatory Visit: Payer: Self-pay | Admitting: Internal Medicine

## 2015-12-21 ENCOUNTER — Telehealth: Payer: Self-pay | Admitting: *Deleted

## 2015-12-21 NOTE — Telephone Encounter (Signed)
Unable to reach patient at time of pre-visit call. Left message for patient to return call when available.  

## 2015-12-22 ENCOUNTER — Ambulatory Visit (INDEPENDENT_AMBULATORY_CARE_PROVIDER_SITE_OTHER): Payer: BLUE CROSS/BLUE SHIELD | Admitting: Internal Medicine

## 2015-12-22 ENCOUNTER — Encounter: Payer: Self-pay | Admitting: Internal Medicine

## 2015-12-22 ENCOUNTER — Telehealth: Payer: Self-pay | Admitting: Internal Medicine

## 2015-12-22 VITALS — BP 122/60 | HR 62 | Temp 98.0°F | Ht 71.0 in | Wt 273.2 lb

## 2015-12-22 DIAGNOSIS — I10 Essential (primary) hypertension: Secondary | ICD-10-CM

## 2015-12-22 DIAGNOSIS — Z09 Encounter for follow-up examination after completed treatment for conditions other than malignant neoplasm: Secondary | ICD-10-CM

## 2015-12-22 DIAGNOSIS — E119 Type 2 diabetes mellitus without complications: Secondary | ICD-10-CM

## 2015-12-22 DIAGNOSIS — Z23 Encounter for immunization: Secondary | ICD-10-CM | POA: Diagnosis not present

## 2015-12-22 DIAGNOSIS — Z0000 Encounter for general adult medical examination without abnormal findings: Secondary | ICD-10-CM | POA: Diagnosis not present

## 2015-12-22 DIAGNOSIS — R079 Chest pain, unspecified: Secondary | ICD-10-CM

## 2015-12-22 DIAGNOSIS — E118 Type 2 diabetes mellitus with unspecified complications: Secondary | ICD-10-CM

## 2015-12-22 DIAGNOSIS — D649 Anemia, unspecified: Secondary | ICD-10-CM | POA: Diagnosis not present

## 2015-12-22 DIAGNOSIS — I2510 Atherosclerotic heart disease of native coronary artery without angina pectoris: Secondary | ICD-10-CM

## 2015-12-22 LAB — CBC WITH DIFFERENTIAL/PLATELET
BASOS ABS: 0.1 10*3/uL (ref 0.0–0.1)
Basophils Relative: 1 % (ref 0–1)
EOS ABS: 0.2 10*3/uL (ref 0.0–0.7)
EOS PCT: 3 % (ref 0–5)
HEMATOCRIT: 37.7 % — AB (ref 39.0–52.0)
Hemoglobin: 12.1 g/dL — ABNORMAL LOW (ref 13.0–17.0)
LYMPHS ABS: 1.6 10*3/uL (ref 0.7–4.0)
LYMPHS PCT: 31 % (ref 12–46)
MCH: 29.5 pg (ref 26.0–34.0)
MCHC: 32.1 g/dL (ref 30.0–36.0)
MCV: 92 fL (ref 78.0–100.0)
MONO ABS: 0.7 10*3/uL (ref 0.1–1.0)
MPV: 12 fL (ref 8.6–12.4)
Monocytes Relative: 13 % — ABNORMAL HIGH (ref 3–12)
Neutro Abs: 2.7 10*3/uL (ref 1.7–7.7)
Neutrophils Relative %: 52 % (ref 43–77)
PLATELETS: 247 10*3/uL (ref 150–400)
RBC: 4.1 MIL/uL — ABNORMAL LOW (ref 4.22–5.81)
RDW: 16.8 % — AB (ref 11.5–15.5)
WBC: 5.2 10*3/uL (ref 4.0–10.5)

## 2015-12-22 LAB — FERRITIN: FERRITIN: 8 ng/mL — AB (ref 20–380)

## 2015-12-22 LAB — IRON: Iron: 43 ug/dL — ABNORMAL LOW (ref 50–180)

## 2015-12-22 MED ORDER — AZELASTINE HCL 0.1 % NA SOLN: 2.0000 | Freq: Every evening | NASAL | Status: DC | PRN

## 2015-12-22 MED ORDER — NITROGLYCERIN 0.4 MG SL SUBL: 0.4000 mg | SUBLINGUAL_TABLET | SUBLINGUAL | Status: DC | PRN

## 2015-12-22 NOTE — Telephone Encounter (Signed)
Please advise 

## 2015-12-22 NOTE — Patient Instructions (Signed)
GO TO THE LAB :      Get the blood work     GO TO THE FRONT DESK Schedule your next appointment for a  Follow up When?   4 months Fasting?  Yes    For nasal congestion: Flonase 2 sprays in each side of the nose in the morning Astelin 2 sprays in each side of the nose at  night

## 2015-12-22 NOTE — Telephone Encounter (Signed)
Pt stated he is retiring 02/02/16 and will be on Medicare. I have scheduled him for a f/u visit in 4 months as requested on AVS. Do you want me to schedule an additional appt for [facility] visit or do that next March when pt is due for next CPE?

## 2015-12-22 NOTE — Progress Notes (Signed)
Pre visit review using our clinic review tool, if applicable. No additional management support is needed unless otherwise documented below in the visit note. 

## 2015-12-22 NOTE — Assessment & Plan Note (Signed)
Td 12-2015; pneumonia 08;prevnar-- 2015; zostavax 2014  had Cscope 2003 negative ,again in 2014 Dr Oletta Lamas, next per GI Prostate cancer screening: DRE, PSA normal 2015. Diet and exercise discussed labs: CBC, iron, ferritin, A1c

## 2015-12-22 NOTE — Progress Notes (Signed)
Subjective:    Patient ID: Daniel Cruz, male    DOB: 12/11/1950, 65 y.o.   MRN: [id number]  DOS:  12/22/2015 Type of visit - description : CPX Interval history: Went to see dermatology recently, had several lesions taking care of Had a recent gallbladder ultrasound, recommend to see surgery. Good medication compliance with all meds.    Review of Systems  Constitutional: No fever. No chills. No unexplained wt changes. No unusual sweats  HEENT: No dental problems, no ear discharge, no facial swelling, no voice changes. No eye discharge, no eye  redness , no  intolerance to light   Respiratory: Was seen with bronchitis, doing better but is still has postnasal dripping, nasal congestion, occasional dry cough.  Cardiovascular: No CP, no leg swelling , no  Palpitations  GI: no nausea, no vomiting, no diarrhea , no  abdominal pain.  No blood in the stools. No dysphagia, no odynophagia    Endocrine: No polyphagia, no polyuria , no polydipsia  GU: No dysuria, gross hematuria, difficulty urinating. No urinary urgency, no frequency.  Musculoskeletal: No joint swellings or unusual aches or pains  Skin: No change in the color of the skin, palor , no  Rash  Allergic, immunologic: No environmental allergies , no  food allergies  Neurological: No dizziness no  syncope. No headaches. No diplopia, no slurred, no slurred speech, no motor deficits, no facial  Numbness  Hematological: No enlarged lymph nodes, no easy bruising , no unusual bleedings  Psychiatry: No suicidal ideas, no hallucinations, no beavior problems, no confusion.  No unusual/severe anxiety, no depression  Past Medical History  Diagnosis Date  . OSA (obstructive sleep apnea)     can't tol. a CPAP  . UTI (lower urinary tract infection)     in the 80s and 11/09  . Fatty liver 02/2009    per u/s  . DIABETES MELLITUS, TYPE II 02/26/2007  . HTN (hypertension)   . GERD (gastroesophageal reflux disease)   . CAD  (coronary artery disease)     dr Radford Pax, MI 2001 (stents x 2), syncope 2010  . Plantar fasciitis of left foot     Past Surgical History  Procedure Laterality Date  . Septoplasty      w/ bilateral inferior turbinate reductions  . Tonsillectomy      Social History   Social History  . Marital Status: Married    Spouse Name: N/A  . Number of Children: 2  . Years of Education: N/A   Occupational History  . 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 Smokeless tobacco: Never Used     Comment: 2001  . Alcohol Use: 0.0 oz/week     Comment: socially  . Drug Use: No  . Sexual Activity: Not on file   Other Topics Concern  . Not on file   Social History Narrative   Lives in [place], [place]   Lives w/ wife, 2 children, 2 g-kids   To retire by April 2017               Family History  Problem Relation Age of Onset  . Stroke Father   . Coronary artery disease Father   . Emphysema Mother     died  . Cancer Neg Hx     colon ,prostate  . Diabetes Neg Hx   . COPD Mother  Medication List       This list is accurate as of: 12/22/15 11:59 PM.  Always use your most recent med list.               aspirin 325 MG tablet  Take 325 mg by mouth daily.     azelastine 0.1 % nasal spray  Commonly known as:  ASTELIN  Place 2 sprays into both nostrils at bedtime as needed for rhinitis. Use in each nostril as directed     clopidogrel 75 MG tablet  Commonly known as:  PLAVIX  Take 1 tablet (75 mg total) by mouth daily.     ezetimibe-simvastatin 10-10 MG tablet  Commonly known as:  VYTORIN  Take 1 tablet by mouth daily.     glipiZIDE 5 MG 24 hr tablet  Commonly known as:  GLIPIZIDE XL  Take 1 tablet (5 mg total) by mouth 2 (two) times daily.     HYDROcodone-homatropine 5-1.5 MG/5ML syrup  Commonly known as:  HYCODAN  Take 5 mLs by mouth at bedtime as needed for cough.      linagliptin 5 MG Tabs tablet  Commonly known as:  TRADJENTA  Take 1 tablet (5 mg total) by mouth daily.     lisinopril-hydrochlorothiazide 20-12.5 MG tablet  Commonly known as:  PRINZIDE,ZESTORETIC  Take 1 tablet by mouth daily.     multivitamin tablet  Take 1 tablet by mouth daily.     nitroGLYCERIN 0.4 MG SL tablet  Commonly known as:  NITROSTAT  Place 1 tablet (0.4 mg total) under the tongue every 5 (five) minutes as needed for chest pain.     pantoprazole 40 MG tablet  Commonly known as:  PROTONIX  Take 40 mg by mouth 2 (two) times daily.     pioglitazone-metformin 15-850 MG tablet  Commonly known as:  ACTOPLUS MET  Take 1 tablet by mouth 2 (two) times daily with a meal.           Objective:   Physical Exam BP 122/60 mmHg  Pulse 62  Temp(Src) 98 F (36.7 C) (Oral)  Ht 5\' 11"  (1.803 m)  Wt 273 lb 4 oz (123.945 kg)  BMI 38.13 kg/m2  SpO2 97%   General:   Well developed, well nourished . NAD.  HEENT:  Normocephalic . Face symmetric, atraumatic Lungs:  CTA B Normal respiratory effort, no intercostal retractions, no accessory muscle use. Heart: RRR,  no murmur.  No pretibial edema bilaterally  Abdomen:  Not distended, soft, non-tender. No rebound or rigidity.   Skin: Exposed areas without rash. Not pale. Not jaundice Neurologic:  alert & oriented X3.  Speech normal, gait appropriate for age and unassisted Strength symmetric and appropriate for age.  Psych: Cognition and judgment appear intact.  Cooperative with normal attention span and concentration.  Behavior appropriate. No anxious or depressed appearing.    Assessment & Plan:    Assessment DM DX 2008 HTN Hyperlipidemia CAD: MI 2001, syncope 2010 OSA, CPAP intolerant ED Mild chronic anemia, low iron stores  GERD Gallbladder polyps, 2014, saw GI, Rx observation Fatty liver 2010 Sees derm occ , Dr 2011  PLAN DM: Continue Actos, metformin, glipizide and Tradjenta. Check  A1c. Hyperlipidemia: Well controlled, recheck a FLP on RTC CAD: Saw cardiology few months ago felt to be stable. OSA: asx , denies feeling fatigue sleepy Gallbladder polyp: Had a repeated ultrasound few days ago, to discuss results with Dr. Terri Piedra soon, apparently he needs to be seen by surgery Mild anemia:  Iron stores low, was referred to GI last year but did not get to see Dr. Oletta Lamas, will see him in few days. I printed his most recent CBC for him to share with GI. Will recheck labs. Rhinitis --as described above, recommend Flonase and Astelin. RTC 4 months, fasting

## 2015-12-23 LAB — HEMOGLOBIN A1C
Hgb A1c MFr Bld: 6.6 % — ABNORMAL HIGH (ref <5.7)
Mean Plasma Glucose: 143 mg/dL — ABNORMAL HIGH (ref <117)

## 2015-12-23 NOTE — Assessment & Plan Note (Signed)
DM: Continue Actos, metformin, glipizide and Tradjenta. Check A1c. Hyperlipidemia: Well controlled, recheck a FLP on RTC CAD: Saw cardiology few months ago felt to be stable. OSA: asx , denies feeling fatigue sleepy Gallbladder polyp: Had a repeated ultrasound few days ago, to discuss results with Dr. Oletta Lamas soon, apparently he needs to be seen by surgery Mild anemia: Iron stores low, was referred to GI last year but did not get to see Dr. Oletta Lamas, will see him in few days. I printed his most recent CBC for him to share with GI. Will recheck labs. Rhinitis --as described above, recommend Flonase and Astelin. RTC 4 months, fasting

## 2015-12-23 NOTE — Addendum Note (Signed)
Addended by: Kathlene November E on: 12/23/2015 02:03 PM   Modules accepted: Miquel Dunn

## 2015-12-25 NOTE — Telephone Encounter (Signed)
We'll do his physical next April when he is due

## 2015-12-25 NOTE — Telephone Encounter (Signed)
Notified pt and he said that was fine

## 2015-12-30 ENCOUNTER — Other Ambulatory Visit: Payer: Self-pay | Admitting: Internal Medicine

## 2016-01-09 ENCOUNTER — Encounter: Payer: Self-pay | Admitting: Internal Medicine

## 2016-01-09 ENCOUNTER — Ambulatory Visit (INDEPENDENT_AMBULATORY_CARE_PROVIDER_SITE_OTHER): Payer: BLUE CROSS/BLUE SHIELD | Admitting: Internal Medicine

## 2016-01-09 VITALS — BP 118/68 | HR 65 | Temp 97.8°F | Ht 71.0 in | Wt 272.5 lb

## 2016-01-09 DIAGNOSIS — J069 Acute upper respiratory infection, unspecified: Secondary | ICD-10-CM | POA: Diagnosis not present

## 2016-01-09 MED ORDER — HYDROCODONE-HOMATROPINE 5-1.5 MG/5ML PO SYRP: 5.0000 mL | ORAL_SOLUTION | Freq: Every evening | ORAL | Status: DC | PRN

## 2016-01-09 NOTE — Progress Notes (Signed)
Pre visit review using our clinic review tool, if applicable. No additional management support is needed unless otherwise documented below in the visit note. 

## 2016-01-09 NOTE — Progress Notes (Signed)
Subjective:    Patient ID: Daniel Cruz, male    DOB: 07-27-1951, 65 y.o.   MRN: [id number]  DOS:  01/09/2016 Type of visit - description : Acute visit Interval history: Was treated for bronchitis in February, got much better except for a lingering cough. Now presents with symptoms for the last 3 days: Head congestion, postnasal dripping, raspy throat, chest tightness. Denies chest pain. He is coughing, more so than the lingering cough he has been having, no sputum. Has not taken any OTCs   Review of Systems No fever, some chills. + Sinus discharge, clear in color. Denies nausea or vomiting No unusual aches or pains  Past Medical History  Diagnosis Date  . OSA (obstructive sleep apnea)     can't tol. a CPAP  . UTI (lower urinary tract infection)     in the 80s and 11/09  . Fatty liver 02/2009    per u/s  . DIABETES MELLITUS, TYPE II 02/26/2007  . HTN (hypertension)   . GERD (gastroesophageal reflux disease)   . CAD (coronary artery disease)     dr Radford Pax, MI 2001 (stents x 2), syncope 2010  . Plantar fasciitis of left foot   . Anemia, iron deficiency     Past Surgical History  Procedure Laterality Date  . Septoplasty      w/ bilateral inferior turbinate reductions  . Tonsillectomy      Social History   Social History  . Marital Status: Married    Spouse Name: N/A  . Number of Children: 2  . Years of Education: N/A   Occupational History  . Vice Engineer, production at [employer]  . Smoking status: Former Research scientist (life sciences)  . Smokeless tobacco: Never Used     Comment: 2001  . Alcohol Use: 0.0 oz/week     Comment: socially  . Drug Use: No  . Sexual Activity: Not on file   Other Topics Concern  . Not on file   Social History Narrative   Lives in [place], [place]   Lives w/ wife, 2 children, 2 g-kids   To retire by April 2017                  Medication List       This list is accurate as of: 01/09/16   9:10 PM.  Always use your most recent med list.               aspirin 325 MG tablet  Take 325 mg by mouth daily.     azelastine 0.1 % nasal spray  Commonly known as:  ASTELIN  Place 2 sprays into both nostrils at bedtime as needed for rhinitis. Use in each nostril as directed     clopidogrel 75 MG tablet  Commonly known as:  PLAVIX  Take 1 tablet (75 mg total) by mouth daily.     ezetimibe-simvastatin 10-10 MG tablet  Commonly known as:  VYTORIN  Take 1 tablet by mouth daily.     glipiZIDE 5 MG 24 hr tablet  Commonly known as:  GLIPIZIDE XL  Take 1 tablet (5 mg total) by mouth 2 (two) times daily.     HYDROcodone-homatropine 5-1.5 MG/5ML syrup  Commonly known as:  HYCODAN  Take 5 mLs by mouth at bedtime as needed for cough.     linagliptin 5 MG Tabs tablet  Commonly known as:  TRADJENTA  Take 1 tablet (5 mg total)  by mouth daily.     lisinopril-hydrochlorothiazide 20-12.5 MG tablet  Commonly known as:  PRINZIDE,ZESTORETIC  Take 1 tablet by mouth daily.     multivitamin tablet  Take 1 tablet by mouth daily.     nitroGLYCERIN 0.4 MG SL tablet  Commonly known as:  NITROSTAT  Place 1 tablet (0.4 mg total) under the tongue every 5 (five) minutes as needed for chest pain.     pantoprazole 40 MG tablet  Commonly known as:  PROTONIX  Take 40 mg by mouth 2 (two) times daily.     pioglitazone-metformin 15-850 MG tablet  Commonly known as:  ACTOPLUS MET  Take 1 tablet by mouth 2 (two) times daily with a meal.           Objective:   Physical Exam BP 118/68 mmHg  Pulse 65  Temp(Src) 97.8 F (36.6 C) (Oral)  Ht _0  (1.803 m)  Wt 272 lb 8 oz (123.605 kg)  BMI 38.02 kg/m2  SpO2 97% General:   Well developed, well nourished . NAD.  HEENT:  Normocephalic . Face symmetric, atraumatic Nose moderately congested, sinuses no TTP TMs normal Throat symmetric Lungs:  CTA B Normal respiratory effort, no intercostal retractions, no accessory muscle use. Heart: RRR,   no murmur.  No pretibial edema bilaterally  Skin: Not pale. Not jaundice Neurologic:  alert & oriented X3.  Speech normal, gait appropriate for age and unassisted Psych--  Cognition and judgment appear intact.  Cooperative with normal attention span and concentration.  Behavior appropriate. No anxious or depressed appearing.      Assessment & Plan:    Assessment DM DX 2008 HTN Hyperlipidemia CAD: MI 2001, syncope 2010 OSA, CPAP intolerant ED Mild chronic anemia, low iron stores  GERD Gallbladder polyps, 2014, saw GI, Rx observation Fatty liver 2010 Sees derm occ , Dr Allyson Sabal  PLAN URI: Conservative treatment, C instructions. If not improving in 3-4 days will call the office, may need abx

## 2016-01-09 NOTE — Assessment & Plan Note (Signed)
URI: Conservative treatment, C instructions. If not improving in 3-4 days will call the office, may need abx

## 2016-01-09 NOTE — Patient Instructions (Addendum)
Rest, fluids , tylenol  For cough:  Take Mucinex DM twice a day for one week and then as needed until better For peristent night cough-- use hydrocodone, will cause drowsiness   For nasal congestion: Use OTC Nasocort or Flonase : 2 nasal sprays on each side of the nose in the morning until you feel better Also Astelin, at nighttime   Avoid decongestants such as Pseudoephedrine or phenylephrine      Call if not gradually better over the next 10 days  Call anytime if the symptoms are severe

## 2016-01-11 ENCOUNTER — Other Ambulatory Visit: Payer: Self-pay | Admitting: Internal Medicine

## 2016-01-15 ENCOUNTER — Other Ambulatory Visit: Payer: Self-pay | Admitting: Internal Medicine

## 2016-01-31 IMAGING — US US ABDOMEN COMPLETE
1 series · 14 of 25 positions shown · non-contrast
Comparison: 07/26/2013

CLINICAL DATA: Gallbladder polyp.

EXAM:
ULTRASOUND ABDOMEN COMPLETE

[Series 1: us abdomen complete · 0.31mm/px · 14 of 78 slices shown]
[im 1/78]
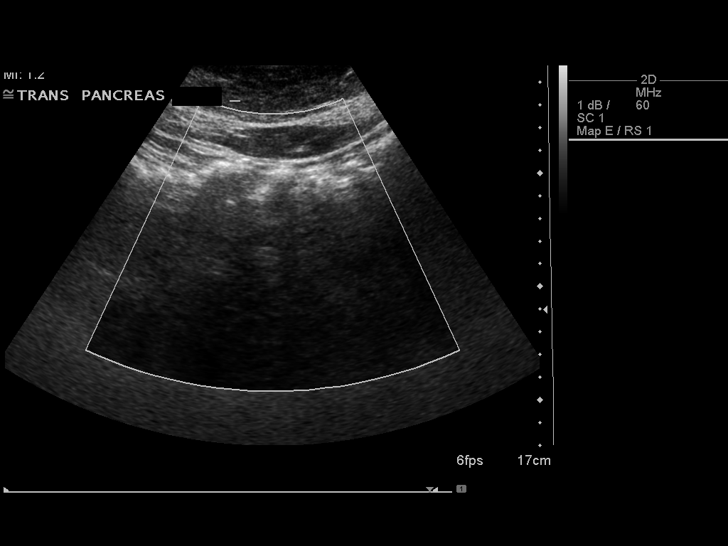
[im 7/78]
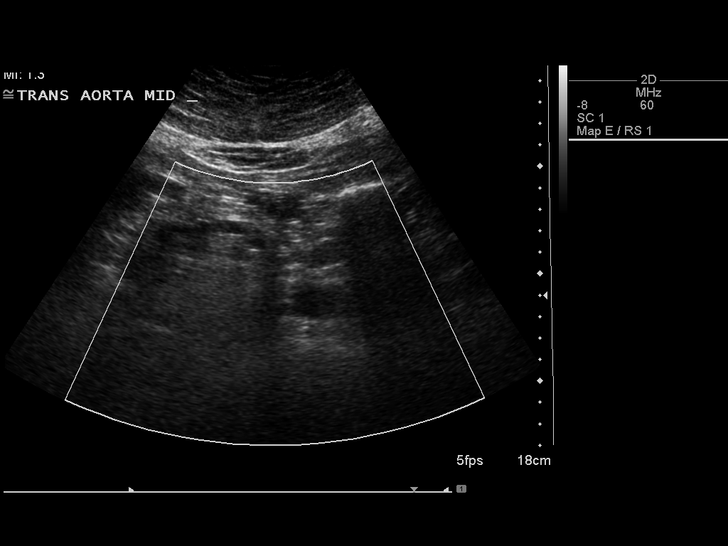
[im 13/78]
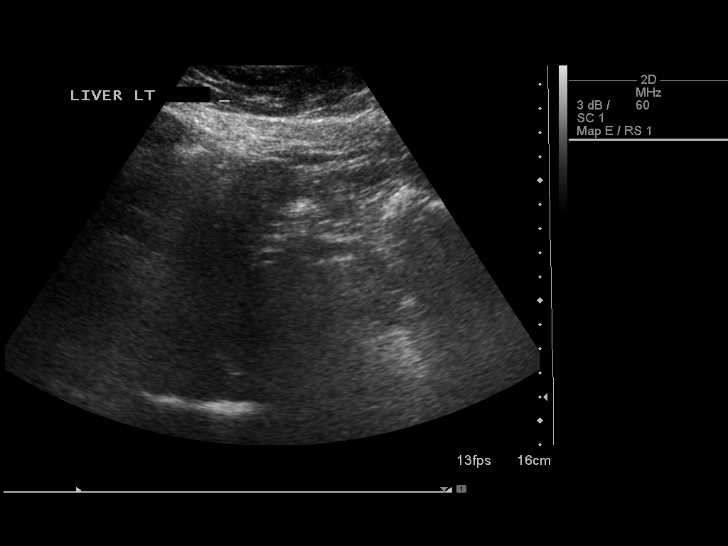
[im 20/78]
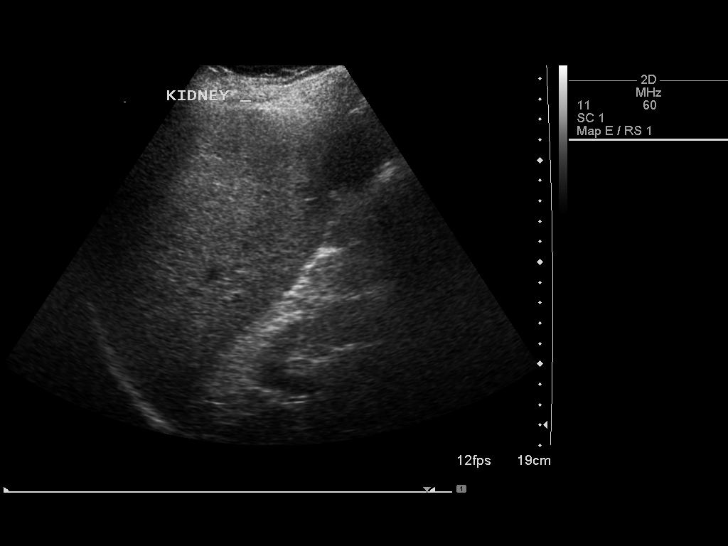
[im 26/78]
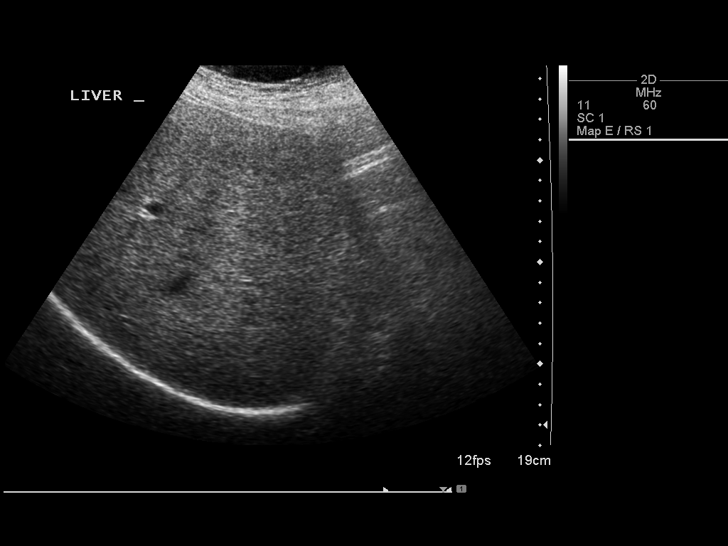
[im 29/78]
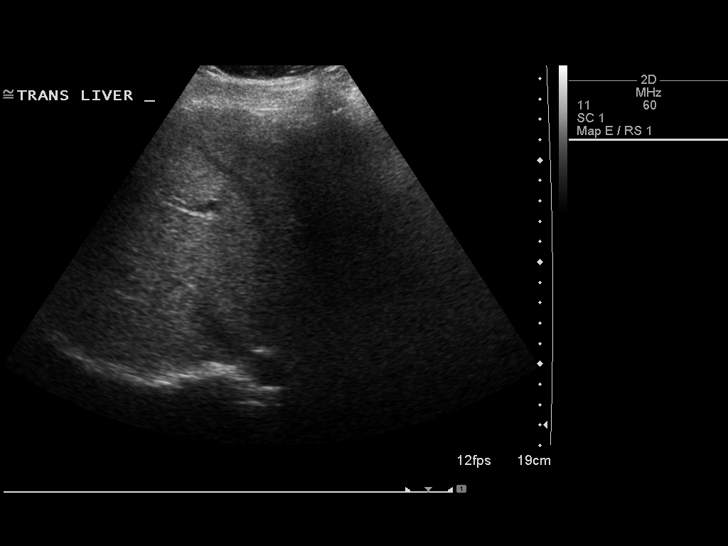
[im 36/78]
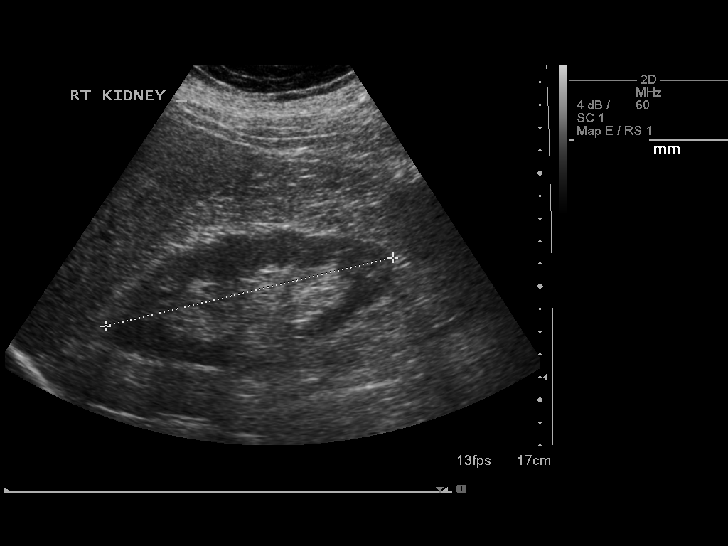
[im 42/78]
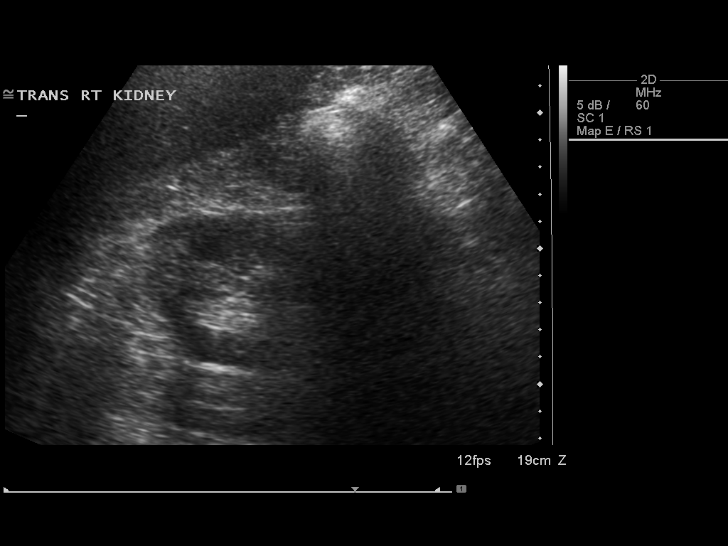
[im 49/78]
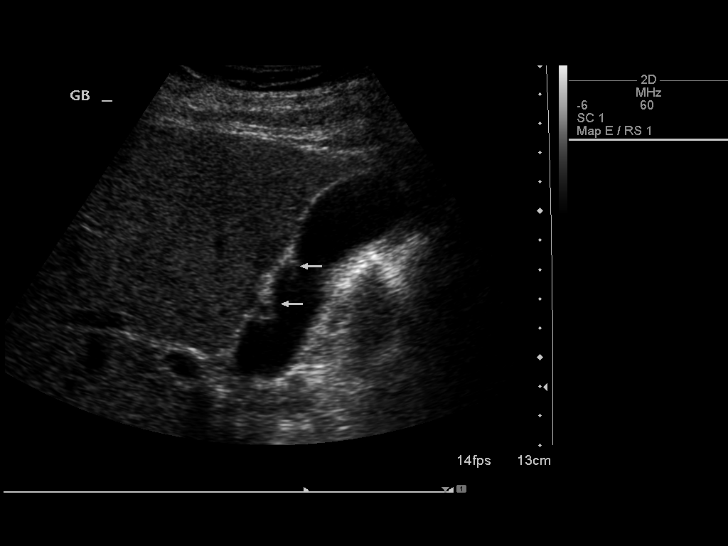
[im 52/78]
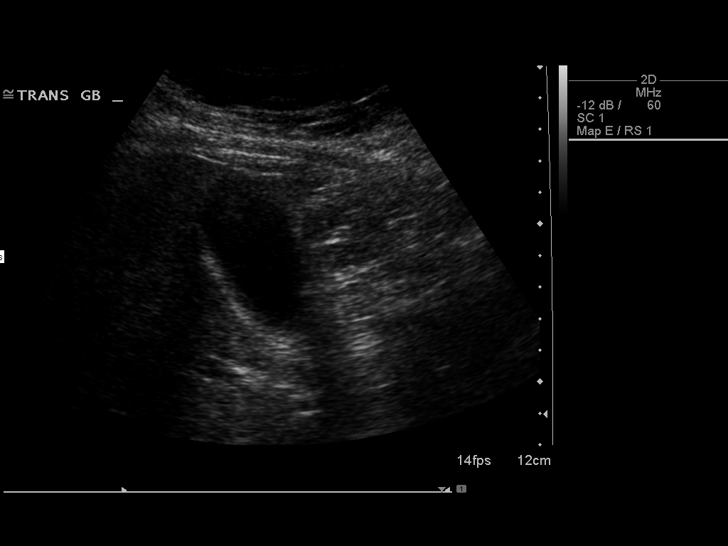
[im 58/78]
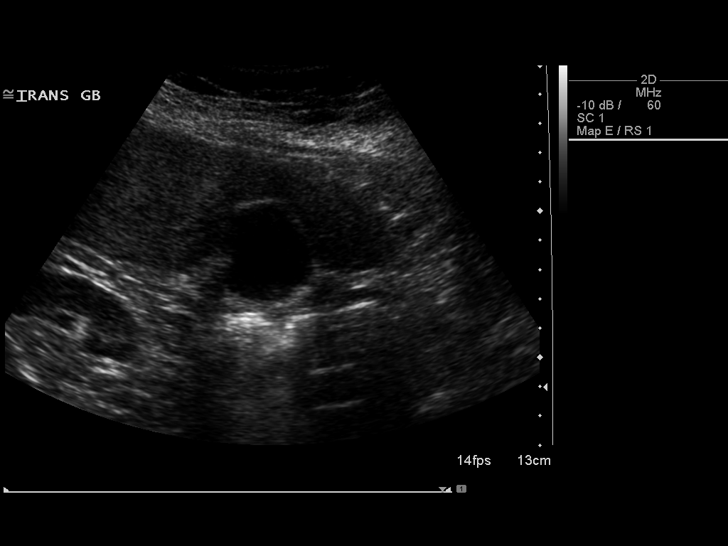
[im 65/78]
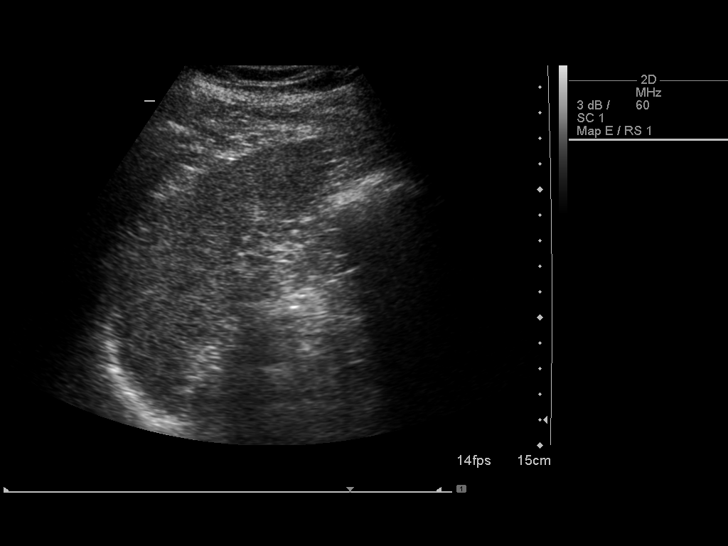
[im 71/78]
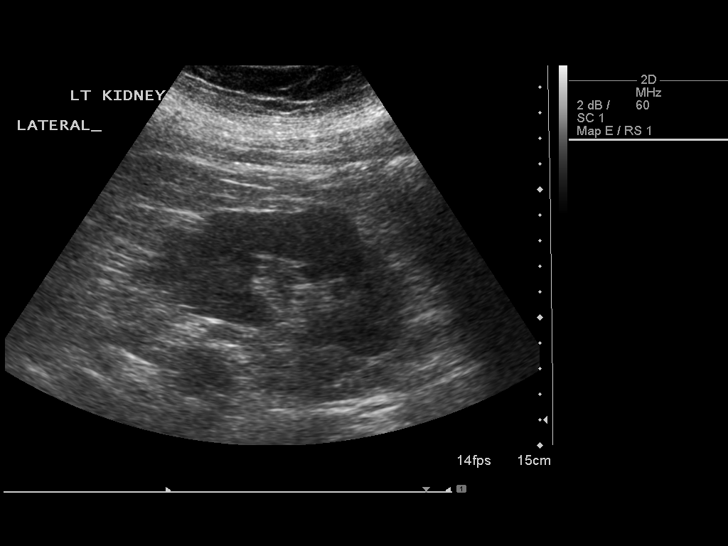
[im 78/78]
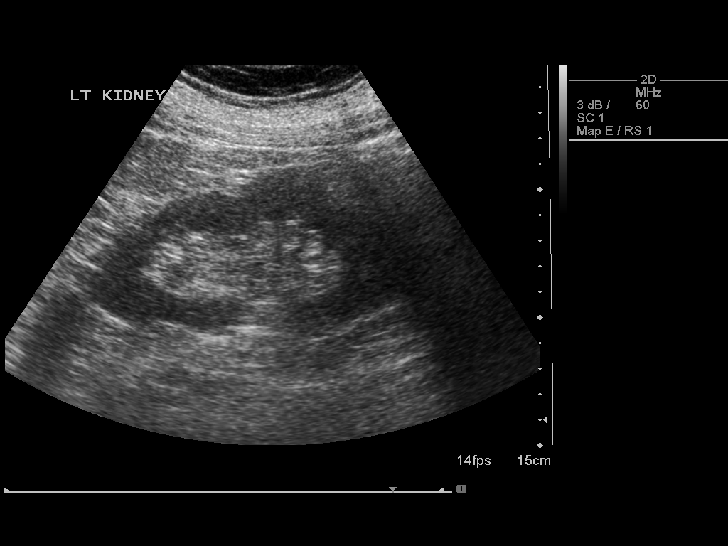

[14 of 25 positions shown; findings below may reference images not displayed]

FINDINGS: Gallbladder: Focal thickening along the anterior mid gallbladder
wall is again noted. Ring down artifact is more convincing than on
the previous study, consistent with focal adenomyomatosis. The
nodular area measures 6-7 mm, similar to previous. No gallstones. No
generalized wall thickening or focal tenderness.

Common bile duct: Diameter: 3 mm. Where visualized, no filling
defect.

Liver: No focal lesion identified, with partial visualization of the
left lobe. Within normal limits in parenchymal echogenicity.

IVC: Limited visualization.  No pathologic findings.

Pancreas: Obscured by bowel gas.

Spleen: Size and appearance within normal limits.

Right Kidney: Length: 13 cm. Echogenicity within normal limits. No
mass or hydronephrosis visualized.

Left Kidney: Length: 13 cm. Echogenicity within normal limits. No
mass or hydronephrosis visualized.

Abdominal aorta: No aneurysm visualized.

Other findings: None.
IMPRESSION: Unchanged nodular thickening of the gallbladder wall. Today's
appearance is consistent with focal adenomyomatosis.

## 2016-04-07 ENCOUNTER — Other Ambulatory Visit: Payer: Self-pay | Admitting: Internal Medicine

## 2016-04-08 ENCOUNTER — Telehealth: Payer: Self-pay | Admitting: Internal Medicine

## 2016-04-15 ENCOUNTER — Telehealth: Payer: Self-pay | Admitting: *Deleted

## 2016-04-15 MED ORDER — SAXAGLIPTIN HCL 2.5 MG PO TABS: 2.5000 mg | ORAL_TABLET | Freq: Every day | ORAL | Status: DC

## 2016-04-15 NOTE — Telephone Encounter (Signed)
MyChart message sent to Pt, instructed to d/c Tradjenta and start Onglyza 5 mg 1 tab daily ([facility]). Instructed him to call office to schedule appt w/ PCP in 6-7 weeks.

## 2016-04-15 NOTE — Telephone Encounter (Signed)
PA denied for Tradjenta. Covered alternatives include glipizide, glipizide XL, metformin, metforming ER, Januvia and Onglyza. Please advise. JG//CMA

## 2016-04-15 NOTE — Addendum Note (Signed)
Addended by: Damita Dunnings D on: 04/15/2016 01:17 PM   Modules accepted: Orders, Medications

## 2016-04-15 NOTE — Telephone Encounter (Signed)
januvia caused back pain before thus if pt agrees, switch to onglyza 5 mg 1 po qd

## 2016-04-24 ENCOUNTER — Ambulatory Visit: Payer: BLUE CROSS/BLUE SHIELD | Admitting: Internal Medicine

## 2016-04-24 NOTE — Telephone Encounter (Signed)
Completed.

## 2016-04-29 ENCOUNTER — Telehealth: Payer: Self-pay | Admitting: Internal Medicine

## 2016-04-29 NOTE — Telephone Encounter (Signed)
Patient Calls   [employer]  You 2 minutes ago (11:24 AM)    Pt :Daniel Cruz   Pt tel :[phone]   Pt came in office and dropped off a letter from [name] notifying that insurance does not cover rx for Tradjenta 5 mg tablet, pt would like provider to see letter left at front office (put in basket at front office) Please advise.  (Routing comment)

## 2016-04-29 NOTE — Telephone Encounter (Signed)
Per OV note from 04/15/2016: Daniel Cruz was denied by insurance and medication changed to Onglyza 5mg  1 tab daily. MyChart message was sent to Pt informing him of change and that [facility] and to call and schedule follow-up in 6-7 weeks. (MyChart message has not been read). Will call Pt to inform of above.

## 2016-04-29 NOTE — Telephone Encounter (Signed)
LMOM informing Pt that per chart, Dr. Larose Kells received letter from insurance company and changed medication to [place] 5mg  1 tab daily, informed him [facility] [address] on [facility]/15/2016 (MyChart message was sent to reflect change in medication) and will need to call pharmacy to have them refill. Instructed him to follow-up w/ PCP in 6-7 weeks. Also, instructed him to call office if he has any questions or concerns.

## 2016-05-02 DIAGNOSIS — K8020 Calculus of gallbladder without cholecystitis without obstruction: Secondary | ICD-10-CM | POA: Diagnosis not present

## 2016-05-02 DIAGNOSIS — R140 Abdominal distension (gaseous): Secondary | ICD-10-CM | POA: Diagnosis not present

## 2016-05-08 ENCOUNTER — Ambulatory Visit (INDEPENDENT_AMBULATORY_CARE_PROVIDER_SITE_OTHER): Payer: Medicare Other | Admitting: Internal Medicine

## 2016-05-08 ENCOUNTER — Encounter: Payer: Self-pay | Admitting: Internal Medicine

## 2016-05-08 VITALS — BP 126/78 | HR 76 | Temp 98.0°F | Resp 14 | Ht 71.0 in | Wt 263.5 lb

## 2016-05-08 DIAGNOSIS — E785 Hyperlipidemia, unspecified: Secondary | ICD-10-CM | POA: Diagnosis not present

## 2016-05-08 DIAGNOSIS — I10 Essential (primary) hypertension: Secondary | ICD-10-CM

## 2016-05-08 DIAGNOSIS — D649 Anemia, unspecified: Secondary | ICD-10-CM | POA: Diagnosis not present

## 2016-05-08 DIAGNOSIS — I2510 Atherosclerotic heart disease of native coronary artery without angina pectoris: Secondary | ICD-10-CM

## 2016-05-08 DIAGNOSIS — E118 Type 2 diabetes mellitus with unspecified complications: Secondary | ICD-10-CM | POA: Diagnosis not present

## 2016-05-08 NOTE — Patient Instructions (Signed)
GO TO THE LAB : Get the blood work     GO TO THE FRONT DESK Schedule your next appointment for a  Check up in 6 months   

## 2016-05-08 NOTE — Progress Notes (Signed)
 Subjective:    Patient ID: Daniel Cruz, male    DOB: 04/24/1951, 65 y.o.   MRN: [id number]  DOS:  05/08/2016 Type of visit - description : Routine checkup Interval history: History of anemia, gallbladder polyps: Saw GI twice, to see surgery soon DM: Good med compliance, no ambulatory CBGs. Due for labs HTN: Good med compliance, no ambulatory BPs High cholesterol: On statins, due for labs.   Review of Systems Denies chest pain, difficulty breathing or lower extremity edema No nausea, vomiting, diarrhea. Diet: Doing better, better portion control. he retired, is a little more active, walks 30 minutes 3 or 4 times a week  Past Medical History:  Diagnosis Date  . Anemia, iron deficiency   . CAD (coronary artery disease)    dr turner, MI 2001 (stents x 2), syncope 2010  . DIABETES MELLITUS, TYPE II 02/26/2007  . Fatty liver 02/2009   per u/s  . GERD (gastroesophageal reflux disease)   . HTN (hypertension)   . OSA (obstructive sleep apnea)    can't tol. a CPAP  . Plantar fasciitis of left foot   . UTI (lower urinary tract infection)    in the 80s and 11/09    Past Surgical History:  Procedure Laterality Date  . SEPTOPLASTY     w/ bilateral inferior turbinate reductions  . TONSILLECTOMY      Social History   Social History  . Marital status: Married    Spouse name: N/A  . Number of children: 2  . Years of education: N/A   Occupational History  . 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 Smoking status: Former Smoker  . Smokeless tobacco: Never Used     Comment: 2001  . Alcohol use 0.0 oz/week     Comment: socially  . Drug use: No  . Sexual activity: Not on file   Other Topics Concern  . Not on file   Social History Narrative   Lives in [place], [place]   Lives w/ wife, 2 children, 2 g-kids   Retired April 2017                  Medication List       Accurate as of 05/08/16  5:40  PM. Always use your most recent med list.          aspirin 325 MG tablet Take 325 mg by mouth daily.   azelastine 0.1 % nasal spray Commonly known as:  ASTELIN Place 2 sprays into both nostrils at bedtime as needed for rhinitis. Use in each nostril as directed   clopidogrel 75 MG tablet Commonly known as:  PLAVIX TAKE 1 TABLET DAILY   ezetimibe-simvastatin 10-10 MG tablet Commonly known as:  VYTORIN Take 1 tablet by mouth daily.   glipiZIDE 5 MG 24 hr tablet Commonly known as:  GLIPIZIDE XL Take 1 tablet (5 mg total) by mouth 2 (two) times daily.   linagliptin 5 MG Tabs tablet Commonly known as:  TRADJENTA Take 5 mg by mouth daily.   lisinopril-hydrochlorothiazide 20-12.5 MG tablet Commonly known as:  PRINZIDE,ZESTORETIC Take 1 tablet by mouth daily.   multivitamin tablet Take 1 tablet by mouth daily.   nitroGLYCERIN 0.4 MG SL tablet Commonly known as:  NITROSTAT Place 1 tablet (0.4 mg total) under the tongue every 5 (five) minutes as needed for chest pain.   pantoprazole 40 MG tablet Commonly known as:  PROTONIX   Take 40 mg by mouth 2 (two) times daily.   pioglitazone-metformin 15-850 MG tablet Commonly known as:  ACTOPLUS MET Take 1 tablet by mouth 2 (two) times daily with a meal.   saxagliptin HCl 2.5 MG Tabs tablet Commonly known as:  ONGLYZA Take 1 tablet (2.5 mg total) by mouth daily.          Objective:   Physical Exam BP 126/78 (BP Location: Left Arm, Patient Position: Sitting, Cuff Size: Normal)   Pulse 76   Temp 98 F (36.7 C) (Oral)   Resp 14   Ht 5' 11" (1.803 m)   Wt 263 lb 8 oz (119.5 kg)   SpO2 97%   BMI 36.75 kg/m  General:   Well developed, well nourished . NAD.  HEENT:  Normocephalic . Face symmetric, atraumatic Lungs:  CTA B Normal respiratory effort, no intercostal retractions, no accessory muscle use. Heart: RRR,  no murmur.  No pretibial edema bilaterally  DIABETIC FEET EXAM: No lower extremity edema Normal pedal  pulses bilaterally Skin normal, nails normal, no calluses Pinprick examination of the feet normal. Neurologic:  alert & oriented X3.  Speech normal, gait appropriate for age and unassisted Psych--  Cognition and judgment appear intact.  Cooperative with normal attention span and concentration.  Behavior appropriate. No anxious or depressed appearing.      Assessment & Plan:   Assessment DM DX 2008 HTN Hyperlipidemia CAD: MI 2001, syncope 2010 OSA, CPAP intolerant ED Mild chronic anemia, low iron stores  GERD Gallbladder polyps, 2014, saw GI, Rx observation Fatty liver 2010 Sees derm occ , Dr Allyson Sabal  PLAN DM: Continue glipizide, Actos plus met and Tradjenta. Once he ran out of Tradjenta, will switch to [place]. Check a A1c HTN: Controlled, continue Zestoretic. Check a BMP Cholesterol: Continue Vytorin, check a FLP, AST, ALT Gallbladder polyps, mild anemia: Saw GI twice, last visit was few days ago, they rx a Hemoccult and surgery referral since the patient has some bloating (related with gallbladder issues?) RTC 6 months.

## 2016-05-08 NOTE — Assessment & Plan Note (Signed)
DM: Continue glipizide, Actos plus met and Tradjenta. Once he ran out of Tradjenta, will switch to [place]. Check a A1c HTN: Controlled, continue Zestoretic. Check a BMP Cholesterol: Continue Vytorin, check a FLP, AST, ALT Gallbladder polyps, mild anemia: Saw GI twice, last visit was few days ago, they rx a Hemoccult and surgery referral since the patient has some bloating (related with gallbladder issues?) RTC 6 months.

## 2016-05-08 NOTE — Progress Notes (Signed)
Pre visit review using our clinic review tool, if applicable. No additional management support is needed unless otherwise documented below in the visit note. 

## 2016-05-09 LAB — ALT: ALT: 15 U/L (ref 0–53)

## 2016-05-09 LAB — BASIC METABOLIC PANEL
BUN: 16 mg/dL (ref 6–23)
CALCIUM: 9.3 mg/dL (ref 8.4–10.5)
CO2: 26 mEq/L (ref 19–32)
Chloride: 104 mEq/L (ref 96–112)
Creatinine, Ser: 1.04 mg/dL (ref 0.40–1.50)
GFR: 75.61 mL/min (ref >60.00)
Glucose, Bld: 220 mg/dL — ABNORMAL HIGH (ref 70–99)
Potassium: 4.4 mEq/L (ref 3.5–5.1)
SODIUM: 137 meq/L (ref 135–145)

## 2016-05-09 LAB — LIPID PANEL
CHOLESTEROL: 115 mg/dL (ref 0–200)
HDL: 50.7 mg/dL (ref >39.00)
LDL CALC: 49 mg/dL (ref 0–99)
NONHDL: 64.55
Total CHOL/HDL Ratio: 2
Triglycerides: 78 mg/dL (ref 0.0–149.0)
VLDL: 15.6 mg/dL (ref 0.0–40.0)

## 2016-05-09 LAB — HEMOGLOBIN A1C: HEMOGLOBIN A1C: 6.8 % — AB (ref 4.6–6.5)

## 2016-05-09 LAB — AST: AST: 22 U/L (ref 0–37)

## 2016-05-14 ENCOUNTER — Ambulatory Visit: Payer: Self-pay | Admitting: Surgery

## 2016-05-14 DIAGNOSIS — K8020 Calculus of gallbladder without cholecystitis without obstruction: Secondary | ICD-10-CM | POA: Diagnosis not present

## 2016-05-14 NOTE — H&P (Signed)
Daniel Cruz 05/14/2016 9:38 AM Location: [facility] Patient #: [id number] DOB: 1951/04/17 Married / Language: English / Race: White Male  History of Present Illness Daniel Cruz A. [name] MD; 05/14/2016 11:59 AM) Patient words: GB      Patient sent at the request of Dr. Oletta Lamas for gallstones. Patient relates a multiple month history of abdominal bloating. He is also been seen in the emergency room over the last 6 months for sudden chest pain which was found to be noncardiac in nature. He denies any difficulty eating fatty or greasy foods. Has an overall sense of abdominal bloating after eating. His bowel function has been normal. Denies blood in the stool and he is not vomiting. Ultrasound was done which showed small gallstones. He also had signs of adenomyomatosis. He is here today to discuss the potential benefit of cholecystectomy. He is not losing weight. He does have a decrease in appetite overall. He does have reflux and takes medication for that. Nothing makes his symptoms better or worse and he has the symptoms daily.  The patient is a 65 year old male.   Other Problems Daniel Cruz, CMA; 05/14/2016 9:38 AM) Chest pain Cholelithiasis Diabetes Mellitus Gastroesophageal Reflux Disease High blood pressure Hypercholesterolemia Myocardial infarction Sleep Apnea  Past Surgical History Daniel Cruz, [place]; 05/14/2016 9:38 AM) Oral Surgery Tonsillectomy Vasectomy  Diagnostic Studies History Daniel Cruz, [place]; 05/14/2016 9:38 AM) Colonoscopy 1-5 years ago  Allergies Daniel Cruz, CMA; 05/14/2016 9:38 AM) No Known Drug Allergies 05/14/2016  Medication History Daniel Cruz, [place]; 05/14/2016 9:41 AM) [name] (5MG Tablet, Oral) Active. Clopidogrel Bisulfate (75MG Tablet, Oral) Active. Vytorin (10-10MG Tablet, Oral) Active. Pantoprazole Sodium (40MG Tablet DR, Oral) Active. Lisinopril-Hydrochlorothiazide (20-12.5MG  Tablet, Oral) Active. GlipiZIDE XL (5MG Tablet ER 24HR, Oral) Active. Centrum Silver (Oral) Active. Actoplus Met (15-850MG Tablet, Oral) Active. Ecotrin (325MG Tablet DR, Oral) Active. Aspirin (325MG Tablet, Oral) Active. Medications Reconciled  Social History Daniel Cruz, [place]; 05/14/2016 9:38 AM) Alcohol use Occasional alcohol use. Caffeine use Carbonated beverages, Coffee. No drug use Tobacco use Former smoker.  Family History Daniel Cruz, [place]; 05/14/2016 9:38 AM) Alcohol Abuse Father, Mother. Cerebrovascular Accident Father. Diabetes Mellitus Family Members In General. Heart Disease Father. Respiratory Condition Mother.     Review of Systems (Denair. Brooks CMA; 05/14/2016 9:38 AM) General Present- Fatigue. Not Present- Appetite Loss, Chills, Fever, Night Sweats, Weight Gain and Weight Loss. Skin Not Present- Change in Wart/Mole, Dryness, Hives, Jaundice, New Lesions, Non-Healing Wounds, Rash and Ulcer. HEENT Not Present- Earache, Hearing Loss, Hoarseness, Nose Bleed, Oral Ulcers, Ringing in the Ears, Seasonal Allergies, Sinus Pain, Sore Throat, Visual Disturbances, Wears glasses/contact lenses and Yellow Eyes. Respiratory Present- Snoring. Not Present- Bloody sputum, Chronic Cough, Difficulty Breathing and Wheezing. Breast Not Present- Breast Mass, Breast Pain, Nipple Discharge and Skin Changes. Cardiovascular Not Present- Chest Pain, Difficulty Breathing Lying Down, Leg Cramps, Palpitations, Rapid Heart Rate, Shortness of Breath and Swelling of Extremities. Gastrointestinal Present- Bloating and Indigestion. Not Present- Abdominal Pain, Bloody Stool, Change in Bowel Habits, Chronic diarrhea, Constipation, Difficulty Swallowing, Excessive gas, Gets full quickly at meals, Hemorrhoids, Nausea, Rectal Pain and Vomiting. Male Genitourinary Not Present- Blood in Urine, Change in Urinary Stream, Frequency, Impotence, Nocturia, Painful Urination, Urgency and  Urine Leakage. Musculoskeletal Not Present- Back Pain, Joint Pain, Joint Stiffness, Muscle Pain, Muscle Weakness and Swelling of Extremities. Neurological Not Present- Decreased Memory, Fainting, Headaches, Numbness, Seizures, Tingling, Tremor, Trouble walking and Weakness. Psychiatric Not Present- Anxiety, Bipolar,  Change in Sleep Pattern, Depression, Fearful and Frequent crying. Endocrine Not Present- Cold Intolerance, Excessive Hunger, Hair Changes, Heat Intolerance, Hot flashes and New Diabetes. Hematology Not Present- Blood Thinners, Easy Bruising, Excessive bleeding, Gland problems, HIV and Persistent Infections.  Vitals [employer] R. Brooks CMA; 05/14/2016 9:38 AM) 05/14/2016 9:38 AM Weight: 266 lb Height: 71in Body Surface Area: 2.38 m Body Mass Index: 37.1 kg/m  BP: 132/80 (Sitting, Left Arm, Standard)      Physical Exam (Kemani Heidel A. Kanesha Cadle MD; 05/14/2016 11:59 AM)  General Mental Status-Alert. General Appearance-Consistent with stated age. Hydration-Well hydrated. Voice-Normal.  Head and Neck Head-normocephalic, atraumatic with no lesions or palpable masses.  Eye Eyeball - Bilateral-Extraocular movements intact. Sclera/Conjunctiva - Bilateral-No scleral icterus.  Chest and Lung Exam Chest and lung exam reveals -quiet, even and easy respiratory effort with no use of accessory muscles and on auscultation, normal breath sounds, no adventitious sounds and normal vocal resonance. Inspection Chest Wall - Normal. Back - normal.  Cardiovascular Cardiovascular examination reveals -on palpation PMI is normal in location and amplitude, no palpable S3 or S4. Normal cardiac borders., normal heart sounds, regular rate and rhythm with no murmurs, carotid auscultation reveals no bruits and normal pedal pulses bilaterally.  Abdomen Inspection Inspection of the abdomen reveals - No Hernias. Skin - Scar - no surgical scars. Palpation/Percussion Palpation and  Percussion of the abdomen reveal - Soft, Non Tender, No Rebound tenderness, No Rigidity (guarding) and No hepatosplenomegaly. Auscultation Auscultation of the abdomen reveals - Bowel sounds normal.  Neurologic Neurologic evaluation reveals -alert and oriented x 3 with no impairment of recent or remote memory. Mental Status-Normal.  Musculoskeletal Normal Exam - Left-Upper Extremity Strength Normal and Lower Extremity Strength Normal. Normal Exam - Right-Upper Extremity Strength Normal, Lower Extremity Weakness.    Assessment & Plan (Faryn Sieg A. Marivel Mcclarty MD; 05/14/2016 12:00 PM)  SYMPTOMATIC CHOLELITHIASIS (K80.20) Impression: Discussed the patient's symptoms with him as well as his multiple other medical problems and risk factors for gallbladder disease. He is diabetic and her the age of 56. Males in this category may have atypical presentation of gallstone disease. I discussed the pros and cons and long-term expectations of cholecystectomy. I explained to him that surgery may not help his overall symptoms since his symptoms are somewhat atypical. He understands the above would like to proceed with cholecystectomy. I told him success rates this circumstance for him to be about 60% at relieving his symptoms. The procedure has been discussed with the patient. Risks of laparoscopic cholecystectomy include bleeding, infection, bile duct injury, leak, death, open surgery, diarrhea, other surgery, organ injury, blood vessel injury, DVT, and additional care.  Current Plans You are being scheduled for surgery - Our schedulers will call you.  You should hear from our office's scheduling department within 5 working days about the location, date, and time of surgery. We try to make accommodations for patient's preferences in scheduling surgery, but sometimes the OR schedule or the surgeon's schedule prevents [place] from making those accommodations.  If you have not heard from our office [phone]) in  5 working days, call the office and ask for your surgeon's nurse.  If you have other questions about your diagnosis, plan, or surgery, call the office and ask for your surgeon's nurse.  Pt Education - Pamphlet Given - Laparoscopic Gallbladder Surgery: discussed with patient and provided information. The anatomy & physiology of hepatobiliary & pancreatic function was discussed. The pathophysiology of gallbladder dysfunction was discussed. Natural history risks without surgery was discussed. I  feel the risks of no intervention will lead to serious problems that outweigh the operative risks; therefore, I recommended cholecystectomy to remove the pathology. I explained laparoscopic techniques with possible need for an open approach. Probable cholangiogram to evaluate the bilary tract was explained as well.  Risks such as bleeding, infection, abscess, leak, injury to other organs, need for further treatment, heart attack, death, and other risks were discussed. I noted a good likelihood this will help address the problem. Possibility that this will not correct all abdominal symptoms was explained. Goals of post-operative recovery were discussed as well. We will work to minimize complications. An educational handout further explaining the pathology and treatment options was given as well. Questions were answered. The patient expresses understanding & wishes to proceed with surgery.  Pt Education - [facility] Laparosopic Post Op HCI ([place]) Pt Education - Laparoscopic Cholecystectomy: gallbladder

## 2016-05-15 DIAGNOSIS — I10 Essential (primary) hypertension: Secondary | ICD-10-CM | POA: Diagnosis not present

## 2016-05-15 DIAGNOSIS — E119 Type 2 diabetes mellitus without complications: Secondary | ICD-10-CM | POA: Diagnosis not present

## 2016-05-15 DIAGNOSIS — Z7984 Long term (current) use of oral hypoglycemic drugs: Secondary | ICD-10-CM | POA: Diagnosis not present

## 2016-05-15 DIAGNOSIS — H35033 Hypertensive retinopathy, bilateral: Secondary | ICD-10-CM | POA: Diagnosis not present

## 2016-05-15 DIAGNOSIS — H524 Presbyopia: Secondary | ICD-10-CM | POA: Diagnosis not present

## 2016-05-15 DIAGNOSIS — H02054 Trichiasis without entropian left upper eyelid: Secondary | ICD-10-CM | POA: Diagnosis not present

## 2016-05-15 DIAGNOSIS — H5201 Hypermetropia, right eye: Secondary | ICD-10-CM | POA: Diagnosis not present

## 2016-05-15 DIAGNOSIS — H52223 Regular astigmatism, bilateral: Secondary | ICD-10-CM | POA: Diagnosis not present

## 2016-05-15 DIAGNOSIS — H5212 Myopia, left eye: Secondary | ICD-10-CM | POA: Diagnosis not present

## 2016-05-15 LAB — HM DIABETES EYE EXAM

## 2016-05-16 ENCOUNTER — Telehealth: Payer: Self-pay

## 2016-05-16 DIAGNOSIS — Z01810 Encounter for preprocedural cardiovascular examination: Secondary | ICD-10-CM

## 2016-05-16 NOTE — Telephone Encounter (Signed)
Not seen since 09/2015. Needs FU with Dr. Fransico Him for surgical clearance. Richardson Dopp, PA-C   05/16/2016 1:27 PM

## 2016-05-16 NOTE — Telephone Encounter (Signed)
I don't see a medical contraindication but for cardiovascular clearance (aspirin, Plavix?) will ask cardiology. Will forward this phone note to [employer]

## 2016-05-16 NOTE — Telephone Encounter (Signed)
Received request for surgical clearance for Pt from [facility] Surgery, plan: Laparoscopic Cholecystectomy w/ Dr. Brantley Stage. Request clearance for Pt to stop Plavix and ASA for 5 days prior to surgery. OV notes and request placed in PCP red folder for review.

## 2016-05-21 NOTE — Telephone Encounter (Signed)
To Dr. Radford Pax for clearance/instructions prior to clearance.

## 2016-05-21 NOTE — Telephone Encounter (Signed)
Referral placed to Dr. Radford Pax for pre-op clearance. Letter printed, signed and faxed to Carlene Coria, [place] at [facility] [phone]).

## 2016-05-21 NOTE — Telephone Encounter (Signed)
Please arrange a visit with cardiology. Send a letter to surgery. The  patient is cleared for a cholecystectomy from the medical standpoint, I am referring the patient to cardiology for cardiac clearance.

## 2016-05-21 NOTE — Telephone Encounter (Signed)
He needs to be seen by extender or myself prior to clearance since he has not been seen in almost a year

## 2016-05-22 NOTE — Telephone Encounter (Signed)
Left message for patient to call and schedule appointment for cardiac clearance.

## 2016-05-23 NOTE — Telephone Encounter (Signed)
Patient has been scheduled for evaluation 8/21.

## 2016-05-27 ENCOUNTER — Ambulatory Visit (INDEPENDENT_AMBULATORY_CARE_PROVIDER_SITE_OTHER): Payer: Medicare Other | Admitting: Cardiology

## 2016-05-27 ENCOUNTER — Encounter: Payer: Self-pay | Admitting: Cardiology

## 2016-05-27 VITALS — BP 110/70 | HR 74 | Ht 71.0 in | Wt 261.4 lb

## 2016-05-27 DIAGNOSIS — Z01818 Encounter for other preprocedural examination: Secondary | ICD-10-CM

## 2016-05-27 DIAGNOSIS — R079 Chest pain, unspecified: Secondary | ICD-10-CM

## 2016-05-27 DIAGNOSIS — I2583 Coronary atherosclerosis due to lipid rich plaque: Principal | ICD-10-CM

## 2016-05-27 DIAGNOSIS — I10 Essential (primary) hypertension: Secondary | ICD-10-CM

## 2016-05-27 DIAGNOSIS — I2510 Atherosclerotic heart disease of native coronary artery without angina pectoris: Secondary | ICD-10-CM | POA: Diagnosis not present

## 2016-05-27 DIAGNOSIS — E785 Hyperlipidemia, unspecified: Secondary | ICD-10-CM | POA: Diagnosis not present

## 2016-05-27 MED ORDER — NITROGLYCERIN 0.4 MG SL SUBL: 0.4000 mg | SUBLINGUAL_TABLET | SUBLINGUAL | 3 refills | Status: DC | PRN

## 2016-05-27 MED ORDER — ASPIRIN 81 MG PO TABS: 81.0000 mg | ORAL_TABLET | Freq: Every day | ORAL | Status: DC

## 2016-05-27 NOTE — Progress Notes (Signed)
Cardiology Office Note    Date:  05/27/2016   ID:  MINNIE SHI, DOB 02/18/1951, MRN [id number]  PCP:  Kathlene November, MD  Cardiologist:  Fransico Him, MD   Chief Complaint  Patient presents with  . Coronary Artery Disease  . Hypertension  . Hyperlipidemia    History of Present Illness:  Daniel Cruz is a 65 y.o. male with previous coronary artery disease, multiple PCI last in 2010 to the LAD/RCA, diabetes, hypertension, hyperlipidemia, obstructive sleep apnea and GERD who presents for followup.  He has continued to have intermittent indgestion and was recently diagnosed with cholelithiasis and is here for surgical clearance for cholecystectomy.  He is doing well.  He denies any typical angina chest pain, SOB, DOE (escept when going up stairs which is not new), LE edema, dizziness, palpitations or syncope.  He walks 3 miles daily without any problems.  He has had some fullness in his epigastrium and upper abdomen that is relieved with antacids.     Past Medical History:  Diagnosis Date  . Anemia, iron deficiency   . CAD (coronary artery disease)    dr Radford Pax, MI 2001 (stents x 2), syncope 2010  . Cholelithiasis   . DIABETES MELLITUS, TYPE II 02/26/2007  . Fatty liver 02/2009   per u/s  . GERD (gastroesophageal reflux disease)   . HTN (hypertension)   . OSA (obstructive sleep apnea)    can't tol. a CPAP  . Plantar fasciitis of left foot   . UTI (lower urinary tract infection)    in the 80s and 11/09    Past Surgical History:  Procedure Laterality Date  . SEPTOPLASTY     w/ bilateral inferior turbinate reductions  . TONSILLECTOMY      Current Medications: Outpatient Medications Prior to Visit  Medication Sig Dispense Refill  . aspirin 325 MG tablet Take 325 mg by mouth daily.    . clopidogrel (PLAVIX) 75 MG tablet TAKE 1 TABLET DAILY 90 tablet 0  . ezetimibe-simvastatin (VYTORIN) 10-10 MG tablet Take 1 tablet by mouth daily. 90 tablet 1  . glipiZIDE (GLIPIZIDE  XL) 5 MG 24 hr tablet Take 1 tablet (5 mg total) by mouth 2 (two) times daily. 180 tablet 2  . linagliptin (TRADJENTA) 5 MG TABS tablet Take 5 mg by mouth daily.    Marland Kitchen lisinopril-hydrochlorothiazide (PRINZIDE,ZESTORETIC) 20-12.5 MG tablet Take 1 tablet by mouth daily. 90 tablet 1  . Multiple Vitamin (MULTIVITAMIN) tablet Take 1 tablet by mouth daily.      . nitroGLYCERIN (NITROSTAT) 0.4 MG SL tablet Place 1 tablet (0.4 mg total) under the tongue every 5 (five) minutes as needed for chest pain. 40 tablet 6  . pantoprazole (PROTONIX) 40 MG tablet Take 40 mg by mouth 2 (two) times daily.    . pioglitazone-metformin (ACTOPLUS MET) 15-850 MG tablet Take 1 tablet by mouth 2 (two) times daily with a meal. 180 tablet 2  . saxagliptin HCl (ONGLYZA) 2.5 MG TABS tablet Take 1 tablet (2.5 mg total) by mouth daily. 30 tablet 1  . azelastine (ASTELIN) 0.1 % nasal spray Place 2 sprays into both nostrils at bedtime as needed for rhinitis. Use in each nostril as directed (Patient not taking: Reported on 05/08/2016) 30 mL 3   No facility-administered medications prior to visit.      Allergies:   Januvia [sitagliptin]   Social History   Social History  . Marital status: Married    Spouse name: N/A  . Number of  children: 2  . Years of education: N/A   Occupational History  . 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 Smoking status: Former Research scientist (life sciences)  . Smokeless tobacco: Never Used     Comment: 2001  . Alcohol use 0.0 oz/week     Comment: socially  . Drug use: No  . Sexual activity: Not Asked   Other Topics Concern  . None   Social History Narrative   Lives in [place], [place]   Lives w/ wife, 2 children, 2 g-kids   Retired April 2017               Family History:  The patient's family history includes COPD in his mother; Coronary artery disease in his father; Emphysema in his mother; Stroke in his father.   ROS:   Please see the  history of present illness.    Review of Systems  Gastrointestinal: Positive for abdominal pain and nausea.   All other systems reviewed and are negative.   PHYSICAL EXAM:   VS:  BP 110/70   Pulse 74   Ht _0  (1.803 m)   Wt 261 lb 6.4 oz (118.6 kg)   SpO2 98%   BMI 36.46 kg/m    GEN: Well nourished, well developed, in no acute distress  HEENT: normal  Neck: no JVD, carotid bruits, or masses Cardiac: RRR; no murmurs, rubs, or gallops,no edema.  Intact distal pulses bilaterally.  Respiratory:  clear to auscultation bilaterally, normal work of breathing GI: soft, nontender, nondistended, + BS MS: no deformity or atrophy  Skin: warm and dry, no rash Neuro:  Alert and Oriented x 3, Strength and sensation are intact Psych: euthymic mood, full affect  Wt Readings from Last 3 Encounters:  05/27/16 261 lb 6.4 oz (118.6 kg)  05/08/16 263 lb 8 oz (119.5 kg)  01/09/16 272 lb 8 oz (123.6 kg)      Studies/Labs Reviewed:   EKG:  EKG is not ordered today.    Recent Labs: 09/07/2015: Magnesium 1.9 12/22/2015: Hemoglobin 12.1; Platelets 247 05/08/2016: ALT 15; BUN 16; Creatinine, Ser 1.04; Potassium 4.4; Sodium 137   Lipid Panel    Component Value Date/Time   CHOL 115 05/08/2016 1438   TRIG 78.0 05/08/2016 1438   TRIG 106 09/15/2006 0947   HDL 50.70 05/08/2016 1438   CHOLHDL 2 05/08/2016 1438   VLDL 15.6 05/08/2016 1438   LDLCALC 49 05/08/2016 1438    Additional studies/ records that were reviewed today include:  none    ASSESSMENT:    1. Coronary artery disease due to lipid rich plaque   2. Essential hypertension   3. Hyperlipidemia      PLAN:  In order of problems listed above:  1. ASCAD s/p remote stent x 2 in 2001 and no ischemia on nuclear stress test 2014.  He will continue with ASA/Plavix and I have instructed him to decrease his ASA to 70m daily.  Continue statin.  2. HTN - BP controlled on current medical regimen.  Continue ACE I and  diuretic. 3. Hyperlipidemia - LDL goal < 70.  Continue statin.  LDL this month is 49. 4. Preoperative cardiac clearance for cholecystectomy - he is stable from a cardiac standpoint for surgery.  He walks 3 miles daily without any angina.  He can hold his ASA and Plavix for 7 day prior to surgery.     Medication Adjustments/Labs and Tests Ordered: Current medicines  are reviewed at length with the patient today.  Concerns regarding medicines are outlined above.  Medication changes, Labs and Tests ordered today are listed in the Patient Instructions below.  There are no Patient Instructions on file for this visit.   Signed, Fransico Him, MD  05/27/2016 8:17 AM    [address] [place], [place], [place]  [address] Phone: [phone]; Fax: [phone]

## 2016-05-27 NOTE — Patient Instructions (Addendum)
Medication Instructions:  1) DECREASE ASPIRIN to 81 mg daily  Labwork: None  Testing/Procedures: None  Follow-Up: Your physician wants you to follow-up in: 1 year with Dr. Radford Pax. You will receive a reminder letter in the mail two months in advance. If you don't receive a letter, please call our office to schedule the follow-up appointment.   Any Other Special Instructions Will Be Listed Below (If Applicable).     If you need a refill on your cardiac medications before your next appointment, please call your pharmacy.

## 2016-06-03 ENCOUNTER — Encounter: Payer: Self-pay | Admitting: Internal Medicine

## 2016-06-04 ENCOUNTER — Ambulatory Visit: Payer: Self-pay | Admitting: Surgery

## 2016-06-11 ENCOUNTER — Other Ambulatory Visit: Payer: Self-pay | Admitting: Internal Medicine

## 2016-06-25 ENCOUNTER — Telehealth: Payer: Self-pay | Admitting: Internal Medicine

## 2016-06-25 NOTE — Telephone Encounter (Signed)
Pt came in the office stating that he received a bill for DOS 05/08/16. He says that the system billed under his old Environmental consultant. Pt says that he wasn't suppose to receive a bill and did. Made copies of pt's bill and provided copies to Supervisor.

## 2016-07-05 NOTE — Pre-Procedure Instructions (Addendum)
Daniel Cruz  07/05/2016      [facility] - [place], [place] - [address] [place] [address] Phone: [phone] Fax: [phone]  [id number] [address], [place] [address] Phone: [phone] Fax: [phone]    Your procedure is scheduled on 07/16/16  Report to [facility] Admitting at 530 A.M.  Call this number if you have problems the morning of surgery:  [phone]   Remember:  Do not eat food or drink liquids after midnight.  Take these medicines the morning of surgery with A SIP OF WATER protonix, nitro if needed  STOP all herbel meds, nsaids (aleve,naproxen,advil,ibuprofen) 7 days prior to surgery starting (07/09/09) including vitamins  Stop aspirin and plavix 07/09/16 per dr Radford Pax  See instructions below for diabetic meds     How to Manage Your Diabetes Before and After Surgery  Why is it important to control my blood sugar before and after surgery? . Improving blood sugar levels before and after surgery helps healing and can limit problems. . A way of improving blood sugar control is eating a healthy diet by: o  Eating less sugar and carbohydrates o  Increasing activity/exercise o  Talking with your doctor about reaching your blood sugar goals . High blood sugars (greater than 180 mg/dL) can raise your risk of infections and slow your recovery, so you will need to focus on controlling your diabetes during the weeks before surgery. . Make sure that the doctor who takes care of your diabetes knows about your planned surgery including the date and location.  How do I manage my blood sugar before surgery? . Check your blood sugar at least 4 times a day, starting 2 days before surgery, to make sure that the level is not too high or low. o Check your blood sugar the morning of your surgery when you wake up and every 2 hours until you get  to the Short Stay unit. . If your blood sugar is less than 70 mg/dL, you will need to treat for low blood sugar: o Do not take insulin. o Treat a low blood sugar (less than 70 mg/dL) with  cup of clear juice (cranberry or apple), 4 glucose tablets, OR glucose gel. o Recheck blood sugar in 15 minutes after treatment (to make sure it is greater than 70 mg/dL). If your blood sugar is not greater than 70 mg/dL on recheck, call [phone] for further instructions. . Report your blood sugar to the short stay nurse when you get to Short Stay.  . If you are admitted to the hospital after surgery: o Your blood sugar will be checked by the staff and you will probably be given insulin after surgery (instead of oral diabetes medicines) to make sure you have good blood sugar levels. o The goal for blood sugar control after surgery is 80-180 mg/dL.     WHAT DO I DO ABOUT MY DIABETES MEDICATION?   Marland Kitchen Do not take oral diabetes medicines (pills) the morning of surgery.(Glipizide,Actomet,Trajenta)    Do not wear jewelry, make-up or nail polish.  Do not wear lotions, powders, or perfumes, or deoderant.  Do not shave 48 hours prior to surgery.  Men may shave face and neck.  Do not bring valuables to the hospital.  [facility] is not responsible for any belongings or valuables.  Contacts, dentures or bridgework may not be worn into surgery.  Leave your suitcase in the car.  After surgery it may be brought to your room.  For patients admitted to the hospital, discharge time will be determined by your treatment team.  Patients discharged the day of surgery will not be allowed to drive home.   Name and phone number of your driver:    Special instructions:   Special Instructions: [place] - Preparing for Surgery  Before surgery, you can play an important role.  Because skin is not sterile, your skin needs to be as free of germs as possible.  You can reduce the number of germs on you skin by washing  with CHG (chlorahexidine gluconate) soap before surgery.  CHG is an antiseptic cleaner which kills germs and bonds with the skin to continue killing germs even after washing.  Please DO NOT use if you have an allergy to CHG or antibacterial soaps.  If your skin becomes reddened/irritated stop using the CHG and inform your nurse when you arrive at Short Stay.  Do not shave (including legs and underarms) for at least 48 hours prior to the first CHG shower.  You may shave your face.  Please follow these instructions carefully:   1.  Shower with CHG Soap the night before surgery and the morning of Surgery.  2.  If you choose to wash your hair, wash your hair first as usual with your normal shampoo.  3.  After you shampoo, rinse your hair and body thoroughly to remove the Shampoo.  4.  Use CHG as you would any other liquid soap.  You can apply chg directly  to the skin and wash gently with scrungie or a clean washcloth.  5.  Apply the CHG Soap to your body ONLY FROM THE NECK DOWN.  Do not use on open wounds or open sores.  Avoid contact with your eyes ears, mouth and genitals (private parts).  Wash genitals (private parts)       with your normal soap.  6.  Wash thoroughly, paying special attention to the area where your surgery will be performed.  7.  Thoroughly rinse your body with warm water from the neck down.  8.  DO NOT shower/wash with your normal soap after using and rinsing off the CHG Soap.  9.  Pat yourself dry with a clean towel.            10.  Wear clean pajamas.            11.  Place clean sheets on your bed the night of your first shower and do not sleep with pets.  Day of Surgery  Do not apply any lotions/deodorants the morning of surgery.  Please wear clean clothes to the hospital/surgery center.  Please read over the  fact sheets that you were given.

## 2016-07-07 ENCOUNTER — Other Ambulatory Visit: Payer: Self-pay | Admitting: Internal Medicine

## 2016-07-08 ENCOUNTER — Encounter (HOSPITAL_COMMUNITY): Payer: Self-pay

## 2016-07-08 ENCOUNTER — Encounter (HOSPITAL_COMMUNITY)
Admission: RE | Admit: 2016-07-08 | Discharge: 2016-07-08 | Disposition: A | Discharge status: Home / Self Care | Payer: Medicare Other | Source: Ambulatory Visit | Attending: Surgery | Admitting: Surgery

## 2016-07-08 DIAGNOSIS — I252 Old myocardial infarction: Secondary | ICD-10-CM | POA: Insufficient documentation

## 2016-07-08 DIAGNOSIS — I10 Essential (primary) hypertension: Secondary | ICD-10-CM | POA: Insufficient documentation

## 2016-07-08 DIAGNOSIS — K219 Gastro-esophageal reflux disease without esophagitis: Secondary | ICD-10-CM | POA: Insufficient documentation

## 2016-07-08 DIAGNOSIS — I2510 Atherosclerotic heart disease of native coronary artery without angina pectoris: Secondary | ICD-10-CM | POA: Diagnosis not present

## 2016-07-08 DIAGNOSIS — Z01812 Encounter for preprocedural laboratory examination: Secondary | ICD-10-CM | POA: Insufficient documentation

## 2016-07-08 DIAGNOSIS — Z955 Presence of coronary angioplasty implant and graft: Secondary | ICD-10-CM | POA: Insufficient documentation

## 2016-07-08 DIAGNOSIS — E119 Type 2 diabetes mellitus without complications: Secondary | ICD-10-CM | POA: Diagnosis not present

## 2016-07-08 HISTORY — DX: Acute myocardial infarction, unspecified: I21.9

## 2016-07-08 LAB — COMPREHENSIVE METABOLIC PANEL
ALK PHOS: 57 U/L (ref 38–126)
ALT: 18 U/L (ref 17–63)
AST: 29 U/L (ref 15–41)
Albumin: 3.8 g/dL (ref 3.5–5.0)
Anion gap: 6 (ref 5–15)
BILIRUBIN TOTAL: 0.7 mg/dL (ref 0.3–1.2)
BUN: 15 mg/dL (ref 6–20)
CALCIUM: 9.3 mg/dL (ref 8.9–10.3)
CO2: 26 mmol/L (ref 22–32)
CREATININE: 0.94 mg/dL (ref 0.61–1.24)
Chloride: 108 mmol/L (ref 101–111)
Glucose, Bld: 141 mg/dL — ABNORMAL HIGH (ref 65–99)
Potassium: 4.2 mmol/L (ref 3.5–5.1)
Sodium: 140 mmol/L (ref 135–145)
TOTAL PROTEIN: 6.3 g/dL — AB (ref 6.5–8.1)

## 2016-07-08 LAB — CBC WITH DIFFERENTIAL/PLATELET
Basophils Absolute: 0 10*3/uL (ref 0.0–0.1)
Basophils Relative: 0 %
EOS PCT: 3 %
Eosinophils Absolute: 0.2 10*3/uL (ref 0.0–0.7)
HEMATOCRIT: 38 % — AB (ref 39.0–52.0)
HEMOGLOBIN: 12.2 g/dL — AB (ref 13.0–17.0)
LYMPHS ABS: 1.2 10*3/uL (ref 0.7–4.0)
Lymphocytes Relative: 20 %
MCH: 30.7 pg (ref 26.0–34.0)
MCHC: 32.1 g/dL (ref 30.0–36.0)
MCV: 95.5 fL (ref 78.0–100.0)
Monocytes Absolute: 0.5 10*3/uL (ref 0.1–1.0)
Monocytes Relative: 9 %
NEUTROS ABS: 4 10*3/uL (ref 1.7–7.7)
Neutrophils Relative %: 68 %
PLATELETS: 195 10*3/uL (ref 150–400)
RBC: 3.98 MIL/uL — AB (ref 4.22–5.81)
RDW: 16.1 % — ABNORMAL HIGH (ref 11.5–15.5)
WBC: 5.9 10*3/uL (ref 4.0–10.5)

## 2016-07-08 LAB — GLUCOSE, CAPILLARY: Glucose-Capillary: 119 mg/dL — ABNORMAL HIGH (ref 65–99)

## 2016-07-09 LAB — HEMOGLOBIN A1C
HEMOGLOBIN A1C: 6.5 % — AB (ref 4.8–5.6)
Mean Plasma Glucose: 140 mg/dL

## 2016-07-09 NOTE — Progress Notes (Signed)
Anesthesia Chart Review:  Pt is a 65 year old male scheduled for laparoscopic cholecystectomy with intraoperative cholangiogram on 07/16/2016 with [name], MD.   - Cardiologist is Fransico Him, MD who cleared pt for surgery at last office visit 05/27/16  PMH includes:  CAD (multiple PCI, last in 2010 was DES to LAD and RCA), heart murmur, HTN, DM, OSA, fatty liver, iron deficiency anemia, GERD. Former smoker. BMI 45  Medications include: ASA, plavix, vytorin, glipizide, linagliptin, lisinopril-hctz, protonix, pioglitazone-metformin. Pt to hold ASA and plavix 7 days before surgery  Preoperative labs reviewed.  HgbA1c 6.5, glucose 141  EKG 09/07/15: sinus rhythm with occasional PVCs, possible PACs with aberrant conduction  Nuclear stress test 07/22/13: Low risk stress nuclear study There is a fixed defect in the inferior wall most consistent with diaphragmatic attenuation but cannot rule out infarct since gated imates were not performed due to PVC's.  If no changes, I anticipate pt can proceed with surgery as scheduled.   Willeen Cass, FNP-BC [facility] Stay [facility]/Anesthesiology Phone: [phone] 07/09/2016 3:08 PM

## 2016-07-14 ENCOUNTER — Other Ambulatory Visit: Payer: Self-pay | Admitting: Internal Medicine

## 2016-07-15 ENCOUNTER — Telehealth: Payer: Self-pay | Admitting: Internal Medicine

## 2016-07-15 MED ORDER — DEXTROSE 5 % IV SOLN
3.0000 g | INTRAVENOUS | Status: AC
  Administered 2016-07-16: 3 g via INTRAVENOUS
  Filled 2016-07-15: qty 3000

## 2016-07-15 NOTE — Telephone Encounter (Signed)
Caller name: [place] Relation to pt: self Call back [phone] Pharmacy:  Reason for call: Pt states had received call from our office (on pt charts there was no tel note - looked at labs or any other note and there is no tel notes regarding calling pt) and is returning the call, Pt was informed that there's no phone calls and also was verified with nurse that was covering, nurse states did not call pt but still pt insisted to make sure if call was from our office to call him back at same tel above, pt states is having surgery tomorrow. Please advise if needed.

## 2016-07-15 NOTE — Telephone Encounter (Signed)
It may have been the surgeons office.     KP

## 2016-07-16 ENCOUNTER — Ambulatory Visit (HOSPITAL_COMMUNITY): Payer: Medicare Other | Admitting: Certified Registered Nurse Anesthetist

## 2016-07-16 ENCOUNTER — Ambulatory Visit (HOSPITAL_COMMUNITY): Payer: Medicare Other

## 2016-07-16 ENCOUNTER — Ambulatory Visit (HOSPITAL_COMMUNITY): Payer: Medicare Other | Admitting: Emergency Medicine

## 2016-07-16 ENCOUNTER — Encounter (HOSPITAL_COMMUNITY): Payer: Self-pay | Admitting: Certified Registered Nurse Anesthetist

## 2016-07-16 ENCOUNTER — Encounter (HOSPITAL_COMMUNITY)
Admission: RE | Disposition: A | Discharge status: Home / Self Care | Payer: Self-pay | Source: Ambulatory Visit | Attending: Surgery

## 2016-07-16 ENCOUNTER — Ambulatory Visit (HOSPITAL_COMMUNITY)
Admission: RE | Admit: 2016-07-16 | Discharge: 2016-07-16 | Disposition: A | Discharge status: Home / Self Care | Payer: Medicare Other | Source: Ambulatory Visit | Attending: Surgery | Admitting: Surgery

## 2016-07-16 DIAGNOSIS — I10 Essential (primary) hypertension: Secondary | ICD-10-CM | POA: Diagnosis not present

## 2016-07-16 DIAGNOSIS — Z811 Family history of alcohol abuse and dependence: Secondary | ICD-10-CM | POA: Insufficient documentation

## 2016-07-16 DIAGNOSIS — Z419 Encounter for procedure for purposes other than remedying health state, unspecified: Secondary | ICD-10-CM

## 2016-07-16 DIAGNOSIS — Z9852 Vasectomy status: Secondary | ICD-10-CM | POA: Insufficient documentation

## 2016-07-16 DIAGNOSIS — Z823 Family history of stroke: Secondary | ICD-10-CM | POA: Insufficient documentation

## 2016-07-16 DIAGNOSIS — Z8249 Family history of ischemic heart disease and other diseases of the circulatory system: Secondary | ICD-10-CM | POA: Diagnosis not present

## 2016-07-16 DIAGNOSIS — I252 Old myocardial infarction: Secondary | ICD-10-CM | POA: Insufficient documentation

## 2016-07-16 DIAGNOSIS — Z836 Family history of other diseases of the respiratory system: Secondary | ICD-10-CM | POA: Insufficient documentation

## 2016-07-16 DIAGNOSIS — E119 Type 2 diabetes mellitus without complications: Secondary | ICD-10-CM | POA: Diagnosis not present

## 2016-07-16 DIAGNOSIS — G4730 Sleep apnea, unspecified: Secondary | ICD-10-CM | POA: Insufficient documentation

## 2016-07-16 DIAGNOSIS — Z87891 Personal history of nicotine dependence: Secondary | ICD-10-CM | POA: Diagnosis not present

## 2016-07-16 DIAGNOSIS — K219 Gastro-esophageal reflux disease without esophagitis: Secondary | ICD-10-CM | POA: Diagnosis not present

## 2016-07-16 DIAGNOSIS — K8010 Calculus of gallbladder with chronic cholecystitis without obstruction: Secondary | ICD-10-CM | POA: Diagnosis not present

## 2016-07-16 DIAGNOSIS — E7800 Pure hypercholesterolemia, unspecified: Secondary | ICD-10-CM | POA: Insufficient documentation

## 2016-07-16 DIAGNOSIS — Z833 Family history of diabetes mellitus: Secondary | ICD-10-CM | POA: Diagnosis not present

## 2016-07-16 DIAGNOSIS — I2510 Atherosclerotic heart disease of native coronary artery without angina pectoris: Secondary | ICD-10-CM | POA: Diagnosis not present

## 2016-07-16 DIAGNOSIS — Z79899 Other long term (current) drug therapy: Secondary | ICD-10-CM | POA: Insufficient documentation

## 2016-07-16 DIAGNOSIS — Z7982 Long term (current) use of aspirin: Secondary | ICD-10-CM | POA: Insufficient documentation

## 2016-07-16 DIAGNOSIS — Z6841 Body Mass Index (BMI) 40.0 and over, adult: Secondary | ICD-10-CM | POA: Insufficient documentation

## 2016-07-16 DIAGNOSIS — K8020 Calculus of gallbladder without cholecystitis without obstruction: Secondary | ICD-10-CM | POA: Diagnosis not present

## 2016-07-16 HISTORY — PX: CHOLECYSTECTOMY: SHX55

## 2016-07-16 LAB — GLUCOSE, CAPILLARY
GLUCOSE-CAPILLARY: 132 mg/dL — AB (ref 65–99)
GLUCOSE-CAPILLARY: 144 mg/dL — AB (ref 65–99)

## 2016-07-16 SURGERY — LAPAROSCOPIC CHOLECYSTECTOMY WITH INTRAOPERATIVE CHOLANGIOGRAM
Anesthesia: General | Site: Abdomen

## 2016-07-16 MED ORDER — MEPERIDINE HCL 25 MG/ML IJ SOLN: 6.2500 mg | INTRAMUSCULAR | Status: DC | PRN

## 2016-07-16 MED ORDER — KETOROLAC TROMETHAMINE 30 MG/ML IJ SOLN
INTRAMUSCULAR | Status: DC | PRN
  Administered 2016-07-16: 30 mg via INTRAVENOUS

## 2016-07-16 MED ORDER — CELECOXIB 200 MG PO CAPS
400.0000 mg | ORAL_CAPSULE | ORAL | Status: AC
  Administered 2016-07-16: 400 mg via ORAL
  Filled 2016-07-16: qty 2

## 2016-07-16 MED ORDER — SODIUM CHLORIDE 0.9 % IR SOLN
Status: DC | PRN
  Administered 2016-07-16: 1000 mL

## 2016-07-16 MED ORDER — ROCURONIUM BROMIDE 100 MG/10ML IV SOLN
INTRAVENOUS | Status: DC | PRN
  Administered 2016-07-16: 30 mg via INTRAVENOUS
  Administered 2016-07-16: 10 mg via INTRAVENOUS

## 2016-07-16 MED ORDER — IOPAMIDOL (ISOVUE-300) INJECTION 61%
INTRAVENOUS | Status: AC
  Filled 2016-07-16: qty 50

## 2016-07-16 MED ORDER — SUGAMMADEX SODIUM 200 MG/2ML IV SOLN
INTRAVENOUS | Status: DC | PRN
  Administered 2016-07-16: 200 mg via INTRAVENOUS

## 2016-07-16 MED ORDER — GABAPENTIN 300 MG PO CAPS
300.0000 mg | ORAL_CAPSULE | ORAL | Status: AC
  Administered 2016-07-16: 300 mg via ORAL
  Filled 2016-07-16: qty 1

## 2016-07-16 MED ORDER — CHLORHEXIDINE GLUCONATE CLOTH 2 % EX PADS: 6.0000 | MEDICATED_PAD | Freq: Once | CUTANEOUS | Status: DC

## 2016-07-16 MED ORDER — OXYCODONE-ACETAMINOPHEN 5-325 MG PO TABS: 1.0000 | ORAL_TABLET | ORAL | 0 refills | Status: DC | PRN

## 2016-07-16 MED ORDER — FENTANYL CITRATE (PF) 100 MCG/2ML IJ SOLN
INTRAMUSCULAR | Status: AC
  Filled 2016-07-16: qty 4

## 2016-07-16 MED ORDER — MIDAZOLAM HCL 2 MG/2ML IJ SOLN
INTRAMUSCULAR | Status: AC
  Filled 2016-07-16: qty 2

## 2016-07-16 MED ORDER — HEMOSTATIC AGENTS (NO CHARGE) OPTIME
TOPICAL | Status: DC | PRN
  Administered 2016-07-16: 1 via TOPICAL

## 2016-07-16 MED ORDER — LIDOCAINE HCL (CARDIAC) 20 MG/ML IV SOLN
INTRAVENOUS | Status: DC | PRN
  Administered 2016-07-16: 100 mg via INTRAVENOUS

## 2016-07-16 MED ORDER — PROPOFOL 10 MG/ML IV BOLUS
INTRAVENOUS | Status: DC | PRN
  Administered 2016-07-16: 160 mg via INTRAVENOUS

## 2016-07-16 MED ORDER — BUPIVACAINE-EPINEPHRINE 0.25% -1:200000 IJ SOLN
INTRAMUSCULAR | Status: AC
  Filled 2016-07-16: qty 1

## 2016-07-16 MED ORDER — LACTATED RINGERS IV SOLN
INTRAVENOUS | Status: DC | PRN
  Administered 2016-07-16: 07:00:00 via INTRAVENOUS

## 2016-07-16 MED ORDER — ONDANSETRON HCL 4 MG PO TABS: 4.0000 mg | ORAL_TABLET | Freq: Three times a day (TID) | ORAL | 0 refills | Status: DC | PRN

## 2016-07-16 MED ORDER — FENTANYL CITRATE (PF) 100 MCG/2ML IJ SOLN
25.0000 ug | INTRAMUSCULAR | Status: DC | PRN
  Administered 2016-07-16: 25 ug via INTRAVENOUS

## 2016-07-16 MED ORDER — SUCCINYLCHOLINE CHLORIDE 20 MG/ML IJ SOLN
INTRAMUSCULAR | Status: DC | PRN
  Administered 2016-07-16: 120 mg via INTRAVENOUS

## 2016-07-16 MED ORDER — FENTANYL CITRATE (PF) 100 MCG/2ML IJ SOLN
INTRAMUSCULAR | Status: AC
  Filled 2016-07-16: qty 2

## 2016-07-16 MED ORDER — PROPOFOL 10 MG/ML IV BOLUS
INTRAVENOUS | Status: AC
  Filled 2016-07-16: qty 20

## 2016-07-16 MED ORDER — MIDAZOLAM HCL 5 MG/5ML IJ SOLN
INTRAMUSCULAR | Status: DC | PRN
  Administered 2016-07-16: 2 mg via INTRAVENOUS

## 2016-07-16 MED ORDER — SUCCINYLCHOLINE CHLORIDE 200 MG/10ML IV SOSY
PREFILLED_SYRINGE | INTRAVENOUS | Status: AC
  Filled 2016-07-16: qty 10

## 2016-07-16 MED ORDER — SODIUM CHLORIDE 0.9 % IV SOLN
INTRAVENOUS | Status: DC | PRN
  Administered 2016-07-16: 15 mL

## 2016-07-16 MED ORDER — ONDANSETRON HCL 4 MG/2ML IJ SOLN
INTRAMUSCULAR | Status: DC | PRN
  Administered 2016-07-16: 4 mg via INTRAVENOUS

## 2016-07-16 MED ORDER — SUGAMMADEX SODIUM 200 MG/2ML IV SOLN
INTRAVENOUS | Status: AC
  Filled 2016-07-16: qty 2

## 2016-07-16 MED ORDER — ROCURONIUM BROMIDE 10 MG/ML (PF) SYRINGE
PREFILLED_SYRINGE | INTRAVENOUS | Status: AC
  Filled 2016-07-16: qty 10

## 2016-07-16 MED ORDER — LACTATED RINGERS IV SOLN: INTRAVENOUS | Status: DC

## 2016-07-16 MED ORDER — FENTANYL CITRATE (PF) 100 MCG/2ML IJ SOLN
INTRAMUSCULAR | Status: DC | PRN
  Administered 2016-07-16 (×5): 50 ug via INTRAVENOUS

## 2016-07-16 MED ORDER — ONDANSETRON HCL 4 MG/2ML IJ SOLN
INTRAMUSCULAR | Status: AC
  Filled 2016-07-16: qty 2

## 2016-07-16 MED ORDER — LIDOCAINE 2% (20 MG/ML) 5 ML SYRINGE
INTRAMUSCULAR | Status: AC
  Filled 2016-07-16: qty 5

## 2016-07-16 MED ORDER — KETOROLAC TROMETHAMINE 30 MG/ML IJ SOLN
INTRAMUSCULAR | Status: AC
  Filled 2016-07-16: qty 1

## 2016-07-16 MED ORDER — METOCLOPRAMIDE HCL 5 MG/ML IJ SOLN: 10.0000 mg | Freq: Once | INTRAMUSCULAR | Status: DC | PRN

## 2016-07-16 MED ORDER — 0.9 % SODIUM CHLORIDE (POUR BTL) OPTIME
TOPICAL | Status: DC | PRN
  Administered 2016-07-16: 1000 mL

## 2016-07-16 MED ORDER — ACETAMINOPHEN 500 MG PO TABS
1000.0000 mg | ORAL_TABLET | ORAL | Status: AC
  Administered 2016-07-16: 1000 mg via ORAL
  Filled 2016-07-16: qty 2

## 2016-07-16 MED ORDER — BUPIVACAINE-EPINEPHRINE 0.25% -1:200000 IJ SOLN
INTRAMUSCULAR | Status: DC | PRN
  Administered 2016-07-16: 9 mL

## 2016-07-16 SURGICAL SUPPLY — 51 items
ADH SKN CLS APL DERMABOND .7 (GAUZE/BANDAGES/DRESSINGS) ×1
APPLIER CLIP ROT 10 11.4 M/L (STAPLE) ×3
APR CLP MED LRG 11.4X10 (STAPLE) ×1
BAG SPEC RTRVL LRG 6X4 10 (ENDOMECHANICALS) ×1
CANISTER SUCTION 2500CC (MISCELLANEOUS) ×3 IMPLANT
CHLORAPREP W/TINT 26ML (MISCELLANEOUS) ×3 IMPLANT
CLIP APPLIE ROT 10 11.4 M/L (STAPLE) ×1 IMPLANT
COVER MAYO STAND STRL (DRAPES) ×3 IMPLANT
COVER SURGICAL LIGHT HANDLE (MISCELLANEOUS) ×3 IMPLANT
DERMABOND ADVANCED (GAUZE/BANDAGES/DRESSINGS) ×2
DERMABOND ADVANCED .7 DNX12 (GAUZE/BANDAGES/DRESSINGS) IMPLANT
DRAPE C-ARM 42X72 X-RAY (DRAPES) ×3 IMPLANT
ELECT REM PT RETURN 9FT ADLT (ELECTROSURGICAL) ×3
ELECTRODE REM PT RTRN 9FT ADLT (ELECTROSURGICAL) ×1 IMPLANT
GLOVE BIO SURGEON STRL SZ8 (GLOVE) ×5 IMPLANT
GLOVE BIOGEL PI IND STRL 6.5 (GLOVE) IMPLANT
GLOVE BIOGEL PI IND STRL 7.0 (GLOVE) IMPLANT
GLOVE BIOGEL PI IND STRL 7.5 (GLOVE) IMPLANT
GLOVE BIOGEL PI IND STRL 8 (GLOVE) ×1 IMPLANT
GLOVE BIOGEL PI IND STRL 8.5 (GLOVE) IMPLANT
GLOVE BIOGEL PI INDICATOR 6.5 (GLOVE) ×2
GLOVE BIOGEL PI INDICATOR 7.0 (GLOVE) ×2
GLOVE BIOGEL PI INDICATOR 7.5 (GLOVE) ×2
GLOVE BIOGEL PI INDICATOR 8 (GLOVE) ×2
GLOVE BIOGEL PI INDICATOR 8.5 (GLOVE) ×2
GLOVE ECLIPSE 7.5 STRL STRAW (GLOVE) ×2 IMPLANT
GLOVE SURG SS PI 6.0 STRL IVOR (GLOVE) ×2 IMPLANT
GLOVE SURG SS PI 6.5 STRL IVOR (GLOVE) ×2 IMPLANT
GOWN STRL REUS W/ TWL LRG LVL3 (GOWN DISPOSABLE) ×2 IMPLANT
GOWN STRL REUS W/ TWL XL LVL3 (GOWN DISPOSABLE) ×1 IMPLANT
GOWN STRL REUS W/TWL LRG LVL3 (GOWN DISPOSABLE) ×9
GOWN STRL REUS W/TWL XL LVL3 (GOWN DISPOSABLE) ×3
HEMOSTAT SNOW SURGICEL 2X4 (HEMOSTASIS) ×2 IMPLANT
KIT BASIN OR (CUSTOM PROCEDURE TRAY) ×3 IMPLANT
KIT ROOM TURNOVER OR (KITS) ×3 IMPLANT
NS IRRIG 1000ML POUR BTL (IV SOLUTION) ×3 IMPLANT
PAD ARMBOARD 7.5X6 YLW CONV (MISCELLANEOUS) ×3 IMPLANT
POUCH SPECIMEN RETRIEVAL 10MM (ENDOMECHANICALS) ×3 IMPLANT
SCISSORS LAP 5X35 DISP (ENDOMECHANICALS) ×3 IMPLANT
SET CHOLANGIOGRAPH 5 50 .035 (SET/KITS/TRAYS/PACK) ×3 IMPLANT
SET IRRIG TUBING LAPAROSCOPIC (IRRIGATION / IRRIGATOR) ×3 IMPLANT
SLEEVE ENDOPATH XCEL 5M (ENDOMECHANICALS) ×3 IMPLANT
SPECIMEN JAR SMALL (MISCELLANEOUS) ×3 IMPLANT
SUT MNCRL AB 4-0 PS2 18 (SUTURE) ×5 IMPLANT
TOWEL OR 17X24 6PK STRL BLUE (TOWEL DISPOSABLE) ×3 IMPLANT
TOWEL OR 17X26 10 PK STRL BLUE (TOWEL DISPOSABLE) ×3 IMPLANT
TRAY LAPAROSCOPIC MC (CUSTOM PROCEDURE TRAY) ×3 IMPLANT
TROCAR XCEL BLUNT TIP 100MML (ENDOMECHANICALS) ×3 IMPLANT
TROCAR XCEL NON-BLD 11X100MML (ENDOMECHANICALS) ×3 IMPLANT
TROCAR XCEL NON-BLD 5MMX100MML (ENDOMECHANICALS) ×3 IMPLANT
TUBING INSUFFLATION (TUBING) ×3 IMPLANT

## 2016-07-16 NOTE — Anesthesia Preprocedure Evaluation (Addendum)
Anesthesia Evaluation  Patient identified by MRN, date of birth, ID band Patient awake    Reviewed: Allergy & Precautions, NPO status , Patient's Chart, lab work & pertinent test results  Airway Mallampati: II  TM Distance: >3 FB Neck ROM: Full    Dental no notable dental hx.    Pulmonary sleep apnea , former smoker,    Pulmonary exam normal breath sounds clear to auscultation       Cardiovascular hypertension, Pt. on medications + CAD, + Past MI and + Cardiac Stents (2001)  Normal cardiovascular exam Rhythm:Regular Rate:Normal     Neuro/Psych negative neurological ROS  negative psych ROS   GI/Hepatic negative GI ROS, Neg liver ROS,   Endo/Other  diabetes, Type 2, Oral Hypoglycemic AgentsMorbid obesity  Renal/GU negative Renal ROS  negative genitourinary   Musculoskeletal negative musculoskeletal ROS (+)   Abdominal   Peds negative pediatric ROS (+)  Hematology negative hematology ROS (+)   Anesthesia Other Findings   Reproductive/Obstetrics negative OB ROS                           Anesthesia Physical Anesthesia Plan  ASA: III  Anesthesia Plan: General   Post-op Pain Management:    Induction: Intravenous  Airway Management Planned: Oral ETT  Additional Equipment:   Intra-op Plan:   Post-operative Plan: Extubation in OR  Informed Consent: I have reviewed the patients History and Physical, chart, labs and discussed the procedure including the risks, benefits and alternatives for the proposed anesthesia with the patient or authorized representative who has indicated his/her understanding and acceptance.   Dental advisory given  Plan Discussed with: CRNA  Anesthesia Plan Comments:         Anesthesia Quick Evaluation

## 2016-07-16 NOTE — Op Note (Signed)
Laparoscopic Cholecystectomy with IOC Procedure Note  Indications: This patient presents with symptomatic gallbladder disease and will undergo laparoscopic cholecystectomy. The procedure has been discussed with the patient. Operative and non operative treatments have been discussed. Risks of surgery include bleeding, infection,  Common bile duct injury,  Injury to the stomach,liver, colon,small intestine, abdominal wall,  Diaphragm,  Major blood vessels,  And the need for an open procedure.  Other risks include worsening of medical problems, death,  DVT and pulmonary embolism, and cardiovascular events.   Medical options have also been discussed. The patient has been informed of long term expectations of surgery and non surgical options,  The patient agrees to proceed.    Pre-operative Diagnosis: Calculus of gallbladder without mention of cholecystitis or obstruction  Post-operative Diagnosis: Same  Surgeon: Drayden Lukas A.   Assistants: Hitchcock RNFA   Anesthesia: General endotracheal anesthesia and Local anesthesia 0.25.% bupivacaine, with epinephrine  ASA Class: 2  Procedure Details  The patient was seen again in the Holding Room. The risks, benefits, complications, treatment options, and expected outcomes were discussed with the patient. The possibilities of reaction to medication, pulmonary aspiration, perforation of viscus, bleeding, recurrent infection, finding a normal gallbladder, the need for additional procedures, failure to diagnose a condition, the possible need to convert to an open procedure, and creating a complication requiring transfusion or operation were discussed with the patient. The patient and/or family concurred with the proposed plan, giving informed consent. The site of surgery properly noted/marked. The patient was taken to Operating Room, identified as Daniel Cruz and the procedure verified as Laparoscopic Cholecystectomy with Intraoperative Cholangiograms. A  Time Out was held and the above information confirmed.  Prior to the induction of general anesthesia, antibiotic prophylaxis was administered. General endotracheal anesthesia was then administered and tolerated well. After the induction, the abdomen was prepped in the usual sterile fashion. The patient was positioned in the supine position with the left arm comfortably tucked, along with some reverse Trendelenburg.  Local anesthetic agent was injected into the skin near the umbilicus and an incision made. The midline fascia was incised and the Hasson technique was used to introduce a 12 mm port under direct vision. It was secured with a figure of eight Vicryl suture placed in the usual fashion. Pneumoperitoneum was then created with CO2 and tolerated well without any adverse changes in the patient's vital signs. Additional trocars were introduced under direct vision with an 11 mm trocar in the epigastrium and 2 5 mm trocars in the right upper quadrant. All skin incisions were infiltrated with a local anesthetic agent before making the incision and placing the trocars.   The gallbladder was identified, the fundus grasped and retracted cephalad. Adhesions were lysed bluntly and with the electrocautery where indicated, taking care not to injure any adjacent organs or viscus. The infundibulum was grasped and retracted laterally, exposing the peritoneum overlying the triangle of Calot. This was then divided and exposed in a blunt fashion. The cystic duct was clearly identified and bluntly dissected circumferentially. The junctions of the gallbladder, cystic duct and common bile duct were clearly identified prior to the division of any linear structure.   An incision was made in the cystic duct and the cholangiogram catheter introduced. The catheter was secured using an endoclip. The study showed no stones and good visualization of the distal and proximal biliary tree. The catheter was then removed.   The cystic  duct was then  ligated with surgical clips  on the patient  side and  clipped on the gallbladder side and divided. The cystic artery was identified, dissected free, ligated with clips and divided as well. Posterior cystic artery clipped and divided.  The gallbladder was dissected from the liver bed in retrograde fashion with the electrocautery. The gallbladder was removed. The liver bed was irrigated and inspected. Hemostasis was achieved with the electrocautery. Copious irrigation was utilized and was repeatedly aspirated until clear all particulate matter. Hemostasis was achieved with no signs of bleeding or bile leakage.  Pneumoperitoneum was completely reduced after viewing removal of the trocars under direct vision. The wound was thoroughly irrigated and the fascia was then closed with a figure of eight suture; the skin was then closed with 4 O monocryl  and a sterile dressing of liquid adhesive was applied.  Instrument, sponge, and needle counts were correct at closure and at the conclusion of the case.   Findings: Cholelithiasis  Estimated Blood Loss: Minimal         Drains: none         Total IV Fluids: 800 mL         Specimens: Gallbladder           Complications: None; patient tolerated the procedure well.         Disposition: PACU - hemodynamically stable.         Condition: stable

## 2016-07-16 NOTE — Transfer of Care (Signed)
Immediate Anesthesia Transfer of Care Note  Patient: Daniel Cruz  Procedure(s) Performed: Procedure(s): LAPAROSCOPIC CHOLECYSTECTOMY WITH INTRAOPERATIVE CHOLANGIOGRAM (N/A)  Patient Location: PACU  Anesthesia Type:General  Level of Consciousness: awake, alert  and oriented  Airway & Oxygen Therapy: Patient Spontanous Breathing and Patient connected to nasal cannula oxygen  Post-op Assessment: Report given to RN and Post -op Vital signs reviewed and stable  Post vital signs: Reviewed and stable  Last Vitals:  Vitals:   07/16/16 0554  BP: (!) 170/75  Pulse: 73  Resp: 20  Temp: 36.7 C    Last Pain:  Vitals:   07/16/16 0554  TempSrc: Oral         Complications: No apparent anesthesia complications

## 2016-07-16 NOTE — Anesthesia Procedure Notes (Signed)
Procedure Name: Intubation Date/Time: 07/16/2016 7:37 AM Performed by: Candis Shine Pre-anesthesia Checklist: Patient identified, Emergency Drugs available, Suction available and Patient being monitored Patient Re-evaluated:Patient Re-evaluated prior to inductionOxygen Delivery Method: Circle System Utilized Preoxygenation: Pre-oxygenation with 100% oxygen Intubation Type: IV induction Ventilation: Mask ventilation without difficulty Laryngoscope Size: Glidescope and 3 Grade View: Grade I Tube type: Oral Tube size: 7.5 mm Number of attempts: 1 Airway Equipment and Method: Stylet Placement Confirmation: ETT inserted through vocal cords under direct vision,  positive ETCO2 and breath sounds checked- equal and bilateral Secured at: 23 cm Tube secured with: Tape Dental Injury: Teeth and Oropharynx as per pre-operative assessment

## 2016-07-16 NOTE — Anesthesia Postprocedure Evaluation (Signed)
Anesthesia Post Note  Patient: Daniel Cruz  Procedure(s) Performed: Procedure(s) (LRB): LAPAROSCOPIC CHOLECYSTECTOMY WITH INTRAOPERATIVE CHOLANGIOGRAM (N/A)  Patient location during evaluation: PACU Anesthesia Type: General Level of consciousness: awake and alert Pain management: pain level controlled Vital Signs Assessment: post-procedure vital signs reviewed and stable Respiratory status: spontaneous breathing, nonlabored ventilation, respiratory function stable and patient connected to nasal cannula oxygen Cardiovascular status: blood pressure returned to baseline and stable Postop Assessment: no signs of nausea or vomiting Anesthetic complications: no    Last Vitals:  Vitals:   07/16/16 0915 07/16/16 0930  BP: (!) 151/87 140/76  Pulse: 70 70  Resp: 16 11  Temp:      Last Pain:  Vitals:   07/16/16 0930  TempSrc:   PainSc: 3                  Montez Hageman

## 2016-07-16 NOTE — Discharge Instructions (Signed)
[facility] ______[facility] °LAPAROSCOPIC SURGERY: POST OP INSTRUCTIONS °Always review your discharge instruction sheet given to you by the facility where your surgery was performed. °IF YOU HAVE DISABILITY OR FAMILY LEAVE FORMS, YOU MUST BRING THEM TO THE OFFICE FOR PROCESSING.   °DO NOT GIVE THEM TO YOUR DOCTOR. ° °1. A prescription for pain medication may be given to you upon discharge.  Take your pain medication as prescribed, if needed.  If narcotic pain medicine is not needed, then you may take acetaminophen (Tylenol) or ibuprofen (Advil) as needed. °2. Take your usually prescribed medications unless otherwise directed. °3. If you need a refill on your pain medication, please contact your pharmacy.  They will contact our office to request authorization. Prescriptions will not be filled after 5pm or on week-ends. °4. You should follow a light diet the first few days after arrival home, such as soup and crackers, etc.  Be sure to include lots of fluids daily. °5. Most patients will experience some swelling and bruising in the area of the incisions.  Ice packs will help.  Swelling and bruising can take several days to resolve.  °6. It is common to experience some constipation if taking pain medication after surgery.  Increasing fluid intake and taking a stool softener (such as Colace) will usually help or prevent this problem from occurring.  A mild laxative (Milk of Magnesia or Miralax) should be taken according to package instructions if there are no bowel movements after 48 hours. °7. Unless discharge instructions indicate otherwise, you may remove your bandages 24-48 hours after surgery, and you may shower at that time.  You may have steri-strips (small skin tapes) in place directly over the incision.  These strips should be left on the skin for 7-10 days.  If your surgeon used skin glue on the incision, you may shower in 24 hours.  The glue will flake off over the next 2-3 weeks.  Any sutures or  staples will be removed at the office during your follow-up visit. °8. ACTIVITIES:  You may resume regular (light) daily activities beginning the next day--such as daily self-care, walking, climbing stairs--gradually increasing activities as tolerated.  You may have sexual intercourse when it is comfortable.  Refrain from any heavy lifting or straining until approved by your doctor. °a. You may drive when you are no longer taking prescription pain medication, you can comfortably wear a seatbelt, and you can safely maneuver your car and apply brakes. °b. RETURN TO WORK:  __________________________________________________________ °9. You should see your doctor in the office for a follow-up appointment approximately 2-3 weeks after your surgery.  Make sure that you call for this appointment within a day or two after you arrive home to insure a convenient appointment time. °10. OTHER INSTRUCTIONS: __________________________________________________________________________________________________________________________ __________________________________________________________________________________________________________________________ °WHEN TO CALL YOUR DOCTOR: °1. Fever over 101.0 °2. Inability to urinate °3. Continued bleeding from incision. °4. Increased pain, redness, or drainage from the incision. °5. Increasing abdominal pain ° °The clinic staff is available to answer your questions during regular business hours.  Please don’t hesitate to call and ask to speak to one of the nurses for clinical concerns.  If you have a medical emergency, go to the nearest emergency room or call 911.  A surgeon from [facility] is always on call at the hospital. °[address], [address], [place], [place]  [address] ? [address] [address], [place], [place]   [address] °[phone] ? [phone] ? FAX [phone] °Web site:   [email or web address] °

## 2016-07-16 NOTE — H&P (Signed)
H&P Encounter Dat Daniel Luna, MD  General Surgery    '[]'$ Hide copied text Daniel Cruz. Broz 05/14/2016 9:38 AM Location: [facility] Patient #: [id number] DOB: [birth year] Married / Language: English / Race: White Male  History of Present Illness Daniel Cruz A. Kortny Lirette MD; 8/Patient words: GB      Patient sent at the request of Dr. Oletta Lamas for gallstones. Patient relates a multiple month history of abdominal bloating. He is also been seen in the emergency room over the last 6 months for sudden chest pain which was found to be noncardiac in nature. He denies any difficulty eating fatty or greasy foods. Has an overall sense of abdominal bloating after eating. His bowel function has been normal. Denies blood in the stool and he is not vomiting. Ultrasound was done which showed small gallstones. He also had signs of adenomyomatosis. He is here today to discuss the potential benefit of cholecystectomy. He is not losing weight. He does have a decrease in appetite overall. He does have reflux and takes medication for that. Nothing makes his symptoms better or worse and he has the symptoms daily.  The patient is a 65 year old male.   Other Problems [name], CMA; 9:38 AM) Chest pain Cholelithiasis Diabetes Mellitus Gastroesophageal Reflux Disease High blood pressure Hypercholesterolemia Myocardial infarction Sleep Apnea  Past Surgical History [name], CMA;:38 AM) Oral Surgery Tonsillectomy Vasectomy  Diagnostic Studies History [name]; 9:38 AM) Colonoscopy 1-5 years ago  Allergies [name], [place];) No Known Drug Allergies 05/14/2016  Medication History [name], CMA;  9:41 AM) [name] ('5MG'$  Tablet, Oral) Active. Clopidogrel Bisulfate ('75MG'$  Tablet, Oral) Active. Vytorin (10-'10MG'$  Tablet, Oral) Active. Pantoprazole Sodium ('40MG'$  Tablet DR, Oral)  Active. Lisinopril-Hydrochlorothiazide (20-12.'5MG'$  Tablet, Oral) Active. GlipiZIDE XL ('5MG'$  Tablet ER 24HR, Oral) Active. Centrum Silver (Oral) Active. Actoplus Met (15-'850MG'$  Tablet, Oral) Active. Ecotrin ('325MG'$  Tablet DR, Oral) Active. Aspirin ('325MG'$  Tablet, Oral) Active. Medications Reconciled  Social History [name], CMA; AM) Alcohol use Occasional alcohol use. Caffeine use Carbonated beverages, Coffee. No drug use Tobacco use Former smoker.  Family History [name], CMA;:38 AM) Alcohol Abuse Father, Mother. Cerebrovascular Accident Father. Diabetes Mellitus Family Members In General. Heart Disease Father. Respiratory Condition Mother.     Review of Systems ([name];  9:38 AM) General Present- Fatigue. Not Present- Appetite Loss, Chills, Fever, Night Sweats, Weight Gain and Weight Loss. Skin Not Present- Change in Wart/Mole, Dryness, Hives, Jaundice, New Lesions, Non-Healing Wounds, Rash and Ulcer. HEENT Not Present- Earache, Hearing Loss, Hoarseness, Nose Bleed, Oral Ulcers, Ringing in the Ears, Seasonal Allergies, Sinus Pain, Sore Throat, Visual Disturbances, Wears glasses/contact lenses and Yellow Eyes. Respiratory Present- Snoring. Not Present- Bloody sputum, Chronic Cough, Difficulty Breathing and Wheezing. Breast Not Present- Breast Mass, Breast Pain, Nipple Discharge and Skin Changes. Cardiovascular Not Present- Chest Pain, Difficulty Breathing Lying Down, Leg Cramps, Palpitations, Rapid Heart Rate, Shortness of Breath and Swelling of Extremities. Gastrointestinal Present- Bloating and Indigestion. Not Present- Abdominal Pain, Bloody Stool, Change in Bowel Habits, Chronic diarrhea, Constipation, Difficulty Swallowing, Excessive gas, Gets full quickly at meals, Hemorrhoids, Nausea, Rectal Pain and Vomiting. Male Genitourinary Not Present- Blood in Urine, Change in Urinary Stream, Frequency, Impotence, Nocturia, Painful  Urination, Urgency and Urine Leakage. Musculoskeletal Not Present- Back Pain, Joint Pain, Joint Stiffness, Muscle Pain, Muscle Weakness and Swelling of Extremities. Neurological Not Present- Decreased Memory, Fainting, Headaches, Numbness, Seizures, Tingling, Tremor, Trouble walking and Weakness. Psychiatric Not Present- Anxiety, Bipolar,  Change in Sleep Pattern, Depression, Fearful and Frequent crying. Endocrine Not Present- Cold Intolerance, Excessive Hunger, Hair Changes, Heat Intolerance, Hot flashes and New Diabetes. Hematology Not Present- Blood Thinners, Easy Bruising, Excessive bleeding, Gland problems, HIV and Persistent Infections.  Vitals [employer] R. Brooks CMA; ) 05/14/2016 9:38 AM Weight: 266 lb Height: 71in Body Surface Area: 2.38 m Body Mass Index: 37.1 kg/m  BP: 132/80 (Sitting, Left Arm, Standard)      Physical Exam ([name] MD; 11:59 AM)  General Mental Status-Alert. General Appearance-Consistent with stated age. Hydration-Well hydrated. Voice-Normal.  Head and Neck Head-normocephalic, atraumatic with no lesions or palpable masses.  Eye Eyeball - Bilateral-Extraocular movements intact. Sclera/Conjunctiva - Bilateral-No scleral icterus.  Chest and Lung Exam Chest and lung exam reveals -quiet, even and easy respiratory effort with no use of accessory muscles and on auscultation, normal breath sounds, no adventitious sounds and normal vocal resonance. Inspection Chest Wall - Normal. Back - normal.  Cardiovascular Cardiovascular examination reveals -on palpation PMI is normal in location and amplitude, no palpable S3 or S4. Normal cardiac borders., normal heart sounds, regular rate and rhythm with no murmurs, carotid auscultation reveals no bruits and normal pedal pulses bilaterally.  Abdomen Inspection Inspection of the abdomen reveals - No Hernias. Skin - Scar - no surgical  scars. Palpation/Percussion Palpation and Percussion of the abdomen reveal - Soft, Non Tender, No Rebound tenderness, No Rigidity (guarding) and No hepatosplenomegaly. Auscultation Auscultation of the abdomen reveals - Bowel sounds normal.  Neurologic Neurologic evaluation reveals -alert and oriented x 3 with no impairment of recent or remote memory. Mental Status-Normal.  Musculoskeletal Normal Exam - Left-Upper Extremity Strength Normal and Lower Extremity Strength Normal. Normal Exam - Right-Upper Extremity Strength Normal, Lower Extremity Weakness.    Assessment & Plan (Masaye Gatchalian A. Izrael Peak MD;  12:00 PM)  SYMPTOMATIC CHOLELITHIASIS (K80.20) Impression: Discussed the patient's symptoms with him as well as his multiple other medical problems and risk factors for gallbladder disease. He is diabetic and her the age of 18. Males in this category may have atypical presentation of gallstone disease. I discussed the pros and cons and long-term expectations of cholecystectomy. I explained to him that surgery may not help his overall symptoms since his symptoms are somewhat atypical. He understands the above would like to proceed with cholecystectomy. I told him success rates this circumstance for him to be about 60% at relieving his symptoms. The procedure has been discussed with the patient. Risks of laparoscopic cholecystectomy include bleeding, infection, bile duct injury, leak, death, open surgery, diarrhea, other surgery, organ injury, blood vessel injury, DVT, and additional care.  Current Plans You are being scheduled for surgery - Our schedulers will call you.  You should hear from our office's scheduling department within 5 working days about the location, date, and time of surgery. We try to make accommodations for patient's preferences in scheduling surgery, but sometimes the OR schedule or the surgeon's schedule prevents [place] from making those accommodations.  If you  have not heard from our office [phone]) in 5 working days, call the office and ask for your surgeon's nurse.  If you have other questions about your diagnosis, plan, or surgery, call the office and ask for your surgeon's nurse.  Pt Education - Pamphlet Given - Laparoscopic Gallbladder Surgery: discussed with patient and provided information. The anatomy & physiology of hepatobiliary & pancreatic function was discussed. The pathophysiology of gallbladder dysfunction was discussed. Natural history risks without surgery was discussed. I feel the risks  of no intervention will lead to serious problems that outweigh the operative risks; therefore, I recommended cholecystectomy to remove the pathology. I explained laparoscopic techniques with possible need for an open approach. Probable cholangiogram to evaluate the bilary tract was explained as well.  Risks such as bleeding, infection, abscess, leak, injury to other organs, need for further treatment, heart attack, death, and other risks were discussed. I noted a good likelihood this will help address the problem. Possibility that this will not correct all abdominal symptoms was explained. Goals of post-operative recovery were discussed as well. We will work to minimize complications. An educational handout further explaining the pathology and treatment options was given as well. Questions were answered. The patient expresses understanding & wishes to proceed with surgery.  Pt Education - [facility] Laparosopic Post Op HCI ([place]) Pt Education - Laparoscopic Cholecystectomy: gallbladder

## 2016-07-17 ENCOUNTER — Encounter (HOSPITAL_COMMUNITY): Payer: Self-pay | Admitting: Surgery

## 2016-08-08 NOTE — Telephone Encounter (Signed)
[facility] has been resubmitted

## 2016-09-03 DIAGNOSIS — J019 Acute sinusitis, unspecified: Secondary | ICD-10-CM | POA: Diagnosis not present

## 2016-09-03 DIAGNOSIS — R05 Cough: Secondary | ICD-10-CM | POA: Diagnosis not present

## 2016-09-27 ENCOUNTER — Other Ambulatory Visit: Payer: Self-pay | Admitting: Internal Medicine

## 2016-10-07 ENCOUNTER — Other Ambulatory Visit: Payer: Self-pay | Admitting: Internal Medicine

## 2016-11-08 ENCOUNTER — Ambulatory Visit (INDEPENDENT_AMBULATORY_CARE_PROVIDER_SITE_OTHER): Payer: Medicare Other | Admitting: Internal Medicine

## 2016-11-08 ENCOUNTER — Encounter: Payer: Self-pay | Admitting: Internal Medicine

## 2016-11-08 VITALS — BP 124/70 | HR 65 | Temp 98.1°F | Resp 14 | Ht 71.0 in | Wt 261.1 lb

## 2016-11-08 DIAGNOSIS — Z1159 Encounter for screening for other viral diseases: Secondary | ICD-10-CM | POA: Diagnosis not present

## 2016-11-08 DIAGNOSIS — E785 Hyperlipidemia, unspecified: Secondary | ICD-10-CM | POA: Diagnosis not present

## 2016-11-08 DIAGNOSIS — E118 Type 2 diabetes mellitus with unspecified complications: Secondary | ICD-10-CM | POA: Diagnosis not present

## 2016-11-08 DIAGNOSIS — Z23 Encounter for immunization: Secondary | ICD-10-CM | POA: Diagnosis not present

## 2016-11-08 DIAGNOSIS — D649 Anemia, unspecified: Secondary | ICD-10-CM | POA: Diagnosis not present

## 2016-11-08 DIAGNOSIS — I10 Essential (primary) hypertension: Secondary | ICD-10-CM

## 2016-11-08 DIAGNOSIS — Z09 Encounter for follow-up examination after completed treatment for conditions other than malignant neoplasm: Secondary | ICD-10-CM

## 2016-11-08 LAB — CBC WITH DIFFERENTIAL/PLATELET
BASOS PCT: 0.6 % (ref 0.0–3.0)
Basophils Absolute: 0 10*3/uL (ref 0.0–0.1)
EOS PCT: 2.5 % (ref 0.0–5.0)
Eosinophils Absolute: 0.1 10*3/uL (ref 0.0–0.7)
HCT: 38.6 % — ABNORMAL LOW (ref 39.0–52.0)
HEMOGLOBIN: 12.8 g/dL — AB (ref 13.0–17.0)
LYMPHS ABS: 1.1 10*3/uL (ref 0.7–4.0)
Lymphocytes Relative: 21.9 % (ref 12.0–46.0)
MCHC: 33.2 g/dL (ref 30.0–36.0)
MCV: 93.4 fl (ref 78.0–100.0)
MONO ABS: 0.6 10*3/uL (ref 0.1–1.0)
MONOS PCT: 11.9 % (ref 3.0–12.0)
Neutro Abs: 3.1 10*3/uL (ref 1.4–7.7)
Neutrophils Relative %: 63.1 % (ref 43.0–77.0)
Platelets: 224 10*3/uL (ref 150.0–400.0)
RBC: 4.13 Mil/uL — AB (ref 4.22–5.81)
RDW: 15.5 % (ref 11.5–15.5)
WBC: 4.8 10*3/uL (ref 4.0–10.5)

## 2016-11-08 LAB — HEMOGLOBIN A1C: HEMOGLOBIN A1C: 6.8 % — AB (ref 4.6–6.5)

## 2016-11-08 LAB — HEPATITIS C ANTIBODY: HCV Ab: NEGATIVE

## 2016-11-08 MED ORDER — GLIPIZIDE ER 5 MG PO TB24: 5.0000 mg | ORAL_TABLET | Freq: Two times a day (BID) | ORAL | 0 refills | Status: DC

## 2016-11-08 MED ORDER — SAXAGLIPTIN HCL 2.5 MG PO TABS: 2.5000 mg | ORAL_TABLET | Freq: Every day | ORAL | 2 refills | Status: DC

## 2016-11-08 MED ORDER — PIOGLITAZONE HCL-METFORMIN HCL 15-850 MG PO TABS: 1.0000 | ORAL_TABLET | Freq: Two times a day (BID) | ORAL | 2 refills | Status: DC

## 2016-11-08 MED ORDER — CLOPIDOGREL BISULFATE 75 MG PO TABS: 75.0000 mg | ORAL_TABLET | Freq: Every day | ORAL | 2 refills | Status: DC

## 2016-11-08 MED ORDER — SAXAGLIPTIN HCL 2.5 MG PO TABS: 2.5000 mg | ORAL_TABLET | Freq: Every day | ORAL | 0 refills | Status: DC

## 2016-11-08 MED ORDER — GLIPIZIDE ER 5 MG PO TB24: 5.0000 mg | ORAL_TABLET | Freq: Two times a day (BID) | ORAL | 2 refills | Status: DC

## 2016-11-08 MED ORDER — LISINOPRIL-HYDROCHLOROTHIAZIDE 20-12.5 MG PO TABS: 1.0000 | ORAL_TABLET | Freq: Every day | ORAL | 2 refills | Status: DC

## 2016-11-08 MED ORDER — EZETIMIBE-SIMVASTATIN 10-10 MG PO TABS: 1.0000 | ORAL_TABLET | Freq: Every day | ORAL | 2 refills | Status: DC

## 2016-11-08 NOTE — Assessment & Plan Note (Signed)
DM: Continue Actos plus met, just finished Tradjenta, will start Onglyza, continue Glucotrol. Check a A1c, refill all  meds HTN: Good compliance with Zestoretic, last BMP satisfactory, RF meds Hyperlipidemia: Well-controlled Vytorin, RF medications Mild chronic anemia: Check a CBC Gallbladder polyps, had surgery, doing great.  primary care: Flu shot, check for hep C screening RTC 4 months, CPX

## 2016-11-08 NOTE — Patient Instructions (Signed)
GO TO THE LAB : Get the blood work     GO TO THE FRONT DESK Schedule your next appointment for a  physical exam in 4 months , fasting 

## 2016-11-08 NOTE — Progress Notes (Signed)
Pre visit review using our clinic review tool, if applicable. No additional management support is needed unless otherwise documented below in the visit note. 

## 2016-11-08 NOTE — Progress Notes (Signed)
Subjective:    Patient ID: Daniel Cruz, male    DOB: [birth year], 66 y.o.   MRN: [id number]  DOS:  11/08/2016 Type of visit - description : rov Interval history:  DM: Good labs with medication, was on Tradjenta up to 3 days ago, needs a prescription Onglyza Gallbladder: Status post surgery, had bloating, symptoms gone. Feeling great. For about a year has developed tremors, only with action, only in his hands. No other associated symptoms. CAD: Good med compliance, on aspirin 81, Plavix. No apparent side effects.  Review of Systems No chest pain or difficulty breathing No nausea, vomiting, diarrhea or blood in the stools  Past Medical History:  Diagnosis Date  . Anemia, iron deficiency    hx  . Arthritis   . CAD (coronary artery disease)    dr Radford Pax, MI 2001 (stents x 2), syncope 2010  . Cholelithiasis   . DIABETES MELLITUS, TYPE II 02/26/2007  . Fatty liver 02/2009   per u/s  . GERD (gastroesophageal reflux disease)   . Heart murmur   . HTN (hypertension)   . Myocardial infarction   . OSA (obstructive sleep apnea)    can't tol. a CPAP  . Plantar fasciitis of left foot   . UTI (lower urinary tract infection)    in the 80s and 11/09  . UTI (urinary tract infection)    hx    Past Surgical History:  Procedure Laterality Date  . CARDIAC CATHETERIZATION  10/2008   stents  . CHOLECYSTECTOMY N/A 07/16/2016   Procedure: LAPAROSCOPIC CHOLECYSTECTOMY WITH INTRAOPERATIVE CHOLANGIOGRAM;  Surgeon: [name], MD;  Location: [facility];  Service: General;  Laterality: N/A;  . SEPTOPLASTY     w/ bilateral inferior turbinate reductions  . TONSILLECTOMY      Social History   Social History  . Marital status: Married    Spouse name: N/A  . Number of children: 2  . Years of education: N/A   Occupational History  . 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 Smoking status: Former Smoker    Packs/day:  1.50    Years: 16.00    Quit date: 07/08/2000  . Smokeless tobacco: Never Used     Comment: 2001  . Alcohol use 0.0 oz/week     Comment: socially  . Drug use: No  . Sexual activity: Not on file   Other Topics Concern  . Not on file   Social History Narrative   Lives in [place], [place]   Lives w/ wife, 2 children, 2 g-kids   Retired April 2017                Allergies as of 11/08/2016      Reactions   Januvia [sitagliptin] Nausea Only, Other (See Comments)   Back Pain      Medication List       Accurate as of 11/08/16  1:07 PM. Always use your most recent med list.          aspirin EC 81 MG tablet Take 81 mg by mouth daily.   clopidogrel 75 MG tablet Commonly known as:  PLAVIX Take 1 tablet (75 mg total) by mouth daily.   ezetimibe-simvastatin 10-10 MG tablet Commonly known as:  VYTORIN Take 1 tablet by mouth daily.   glipiZIDE 5 MG 24 hr tablet Commonly known as:  GLUCOTROL XL Take 1 tablet (5 mg total) by mouth 2 (two) times daily.  ibuprofen 200 MG tablet Commonly known as:  ADVIL,MOTRIN Take 200 mg by mouth every 6 (six) hours as needed (For pain.).   lisinopril-hydrochlorothiazide 20-12.5 MG tablet Commonly known as:  PRINZIDE,ZESTORETIC Take 1 tablet by mouth daily.   multivitamin tablet Take 1 tablet by mouth daily.   nitroGLYCERIN 0.4 MG SL tablet Commonly known as:  NITROSTAT Place 1 tablet (0.4 mg total) under the tongue every 5 (five) minutes as needed for chest pain.   pantoprazole 40 MG tablet Commonly known as:  PROTONIX Take 40 mg by mouth 2 (two) times daily.   pioglitazone-metformin 15-850 MG tablet Commonly known as:  ACTOPLUS MET Take 1 tablet by mouth 2 (two) times daily with a meal.   saxagliptin HCl 2.5 MG Tabs tablet Commonly known as:  ONGLYZA Take 1 tablet (2.5 mg total) by mouth daily.   saxagliptin HCl 2.5 MG Tabs tablet Commonly known as:  ONGLYZA Take 1 tablet (2.5 mg total) by mouth daily.            Objective:   Physical Exam BP 124/70 (BP Location: Left Arm, Patient Position: Sitting, Cuff Size: Normal)   Pulse 65   Temp 98.1 F (36.7 C) (Oral)   Resp 14   Ht '5\' 11"'$  (1.803 m)   Wt 261 lb 2 oz (118.4 kg)   SpO2 93%   BMI 36.42 kg/m  General:   Well developed, well nourished . NAD.  HEENT:  Normocephalic . Face symmetric, atraumatic Lungs:  CTA B Normal respiratory effort, no intercostal retractions, no accessory muscle use. Heart: RRR,  no murmur.  No pretibial edema bilaterally  Skin: Not pale. Not jaundice Neurologic:  alert & oriented X3.  Speech normal, gait appropriate for age and unassisted. Minimal hand tremor if any with action Psych--  Cognition and judgment appear intact.  Cooperative with normal attention span and concentration.  Behavior appropriate. No anxious or depressed appearing.      Assessment & Plan:    Assessment DM DX 2008 HTN Hyperlipidemia CAD: MI 2001, syncope 2010 OSA, CPAP intolerant ED Mild chronic anemia, low iron stores  GERD Gallbladder polyps, 2014, surgery 07-2016 Fatty liver 2010 Sees derm occ , Dr Allyson Sabal  PLAN DM: Continue Actos plus met, just finished Tradjenta, will start Onglyza, continue Glucotrol. Check a A1c, refill all  meds HTN: Good compliance with Zestoretic, last BMP satisfactory, RF meds Hyperlipidemia: Well-controlled Vytorin, RF medications Mild chronic anemia: Check a CBC Gallbladder polyps, had surgery, doing great.  primary care: Flu shot, check for hep C screening RTC 4 months, CPX

## 2016-11-11 MED ORDER — SAXAGLIPTIN HCL 2.5 MG PO TABS: 2.5000 mg | ORAL_TABLET | Freq: Every day | ORAL | 0 refills | Status: DC

## 2016-11-11 NOTE — Addendum Note (Signed)
Addended byDamita Dunnings D on: 11/11/2016 07:41 AM   Modules accepted: Orders

## 2016-11-12 MED ORDER — LINAGLIPTIN 5 MG PO TABS: 5.0000 mg | ORAL_TABLET | Freq: Every day | ORAL | 2 refills | Status: DC

## 2016-11-12 MED ORDER — LINAGLIPTIN 5 MG PO TABS: 5.0000 mg | ORAL_TABLET | Freq: Every day | ORAL | 0 refills | Status: DC

## 2016-11-12 NOTE — Addendum Note (Signed)
Addended byDamita Dunnings D on: 11/12/2016 01:28 PM   Modules accepted: Orders

## 2016-11-24 ENCOUNTER — Other Ambulatory Visit: Payer: Self-pay | Admitting: Internal Medicine

## 2016-11-27 ENCOUNTER — Telehealth: Payer: Self-pay | Admitting: Internal Medicine

## 2016-11-27 MED ORDER — LINAGLIPTIN 5 MG PO TABS: 5.0000 mg | ORAL_TABLET | Freq: Every day | ORAL | 2 refills | Status: DC

## 2016-11-27 NOTE — Telephone Encounter (Signed)
Caller name: Lowella Bandy Relationship to patient: Can be reached: Pharmacy:  [place], [place] to [facility] [phone] (Phone) [phone] (Fax)     Reason for call: [facility] called stating that ONGLYZA is not covered by patient insurance. Plse adv

## 2016-11-27 NOTE — Telephone Encounter (Signed)
Medication was changed to [place]. Rx for Tradjenta re-sent to [facility].

## 2016-12-06 ENCOUNTER — Other Ambulatory Visit: Payer: Self-pay | Admitting: Internal Medicine

## 2016-12-14 ENCOUNTER — Other Ambulatory Visit: Payer: Self-pay | Admitting: Internal Medicine

## 2017-03-06 ENCOUNTER — Telehealth: Payer: Self-pay | Admitting: *Deleted

## 2017-03-06 NOTE — Telephone Encounter (Signed)
AWV scheduled 03/11/17 @915 .

## 2017-03-10 NOTE — Progress Notes (Signed)
Subjective:   Daniel Cruz is a 66 y.o. male who presents for an Initial Medicare Annual Wellness Visit.  Review of Systems  No ROS.  Medicare Wellness Visit.  Cardiac Risk Factors include: advanced age (>4mn, >>50women);diabetes mellitus;hypertension;male gender;obesity (BMI >30kg/m2);sedentary lifestyle Sleep patterns: Pt states he sleeps terrible. States he has been dx with sleep apnea. Will not wear cpap.  Sleeps about 6 hrs per night. Naps often during the day.  Home Safety/Smoke Alarms:  Feels safe in home. Smoke alarms in place.  Living environment; residence and Firearm Safety: Lives with wife. Moiving soon to one story home. No guns.  Seat Belt Safety/Bike Helmet: Wears seat belt.   Counseling:   Eye Exam- Wears trifocals. Dr.Miller yearly.  Dental-Dr.Braddy every 6 months.  Male:   CCS-  Last 01/07/13:  normal PSA-  Lab Results  Component Value Date   PSA 1.03 08/24/2014   PSA 1.15 11/30/2012   PSA 0.86 05/15/2011      Objective:    Today's Vitals   03/11/17 0932  BP: 124/70  Pulse: 73  SpO2: 97%  Weight: 266 lb 12.8 oz (121 kg)  Height: '5\' 11"'$  (1.803 m)   Body mass index is 37.21 kg/m.  Current Medications (verified) Outpatient Encounter Prescriptions as of 03/11/2017  Medication Sig  . aspirin EC 81 MG tablet Take 81 mg by mouth daily.  . clopidogrel (PLAVIX) 75 MG tablet Take 1 tablet (75 mg total) by mouth daily.  .Marland Kitchenezetimibe-simvastatin (VYTORIN) 10-10 MG tablet Take 1 tablet by mouth daily.  .Marland KitchenglipiZIDE (GLUCOTROL XL) 5 MG 24 hr tablet Take 1 tablet (5 mg total) by mouth 2 (two) times daily.  .Marland Kitchenibuprofen (ADVIL,MOTRIN) 200 MG tablet Take 200 mg by mouth every 6 (six) hours as needed (For pain.).  .Marland Kitchenlinagliptin (TRADJENTA) 5 MG TABS tablet Take 1 tablet (5 mg total) by mouth daily.  .Marland Kitchenlisinopril-hydrochlorothiazide (PRINZIDE,ZESTORETIC) 20-12.5 MG tablet Take 1 tablet by mouth daily.  . Multiple Vitamin (MULTIVITAMIN) tablet Take 1 tablet by  mouth daily.    . nitroGLYCERIN (NITROSTAT) 0.4 MG SL tablet Place 1 tablet (0.4 mg total) under the tongue every 5 (five) minutes as needed for chest pain.  . pantoprazole (PROTONIX) 40 MG tablet Take 40 mg by mouth 2 (two) times daily.  . pioglitazone-metformin (ACTOPLUS MET) 15-850 MG tablet Take 1 tablet by mouth 2 (two) times daily with a meal.   No facility-administered encounter medications on file as of 03/11/2017.     Allergies (verified) Januvia [sitagliptin]   History: Past Medical History:  Diagnosis Date  . Anemia, iron deficiency    hx  . Arthritis   . CAD (coronary artery disease)    dr tRadford Pax MI 2001 (stents x 2), syncope 2010  . Cholelithiasis   . DIABETES MELLITUS, TYPE II 02/26/2007  . Fatty liver 02/2009   per u/s  . GERD (gastroesophageal reflux disease)   . Heart murmur   . HTN (hypertension)   . Myocardial infarction ([place]   . OSA (obstructive sleep apnea)    can't tol. a CPAP  . Plantar fasciitis of left foot   . UTI (lower urinary tract infection)    in the 80s and 11/09  . UTI (urinary tract infection)    hx   Past Surgical History:  Procedure Laterality Date  . CARDIAC CATHETERIZATION  10/2008   stents  . CHOLECYSTECTOMY N/A 07/16/2016   Procedure: LAPAROSCOPIC CHOLECYSTECTOMY WITH INTRAOPERATIVE CHOLANGIOGRAM;  Surgeon: TErroll Luna MD;  Location: [facility];  Service: General;  Laterality: N/A;  . SEPTOPLASTY     w/ bilateral inferior turbinate reductions  . TONSILLECTOMY     Family History  Problem Relation Age of Onset  . Emphysema Mother        died  . COPD Mother   . Stroke Father   . Coronary artery disease Father   . Cancer Neg Hx        colon ,prostate  . Diabetes Neg Hx    Social History   Occupational History  . 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 Smoking status: Former Smoker    Packs/day: 1.50    Years: 16.00    Quit date: 07/08/2000  .  Smokeless tobacco: Never Used     Comment: 2001  . Alcohol use 0.0 oz/week     Comment: socially  . Drug use: No  . Sexual activity: Yes   Tobacco Counseling Counseling given: No   Activities of Daily Living In your present state of health, do you have any difficulty performing the following activities: 03/11/2017 07/08/2016  Hearing? N N  Vision? N N  Difficulty concentrating or making decisions? N N  Walking or climbing stairs? Y N  Dressing or bathing? N N  Doing errands, shopping? N N  Preparing Food and eating ? N -  Using the Toilet? N -  In the past six months, have you accidently leaked urine? N -  Do you have problems with loss of bowel control? N -  Managing your Medications? N -  Managing your Finances? N -  Housekeeping or managing your Housekeeping? N -  Some recent data might be hidden    Immunizations and Health Maintenance Immunization History  Administered Date(s) Administered  . H1N1 11/08/2008  . Influenza Split 11/01/2011  . Influenza Whole 07/20/2007, 10/20/2009, 08/14/2010  . Influenza, High Dose Seasonal PF 09/15/2015, 11/08/2016  . Influenza,inj,Quad PF,36+ Mos 06/30/2013, 08/24/2014  . Pneumococcal Conjugate-13 08/24/2014  . Pneumococcal Polysaccharide-23 07/20/2007  . Td 10/08/2003, 12/22/2015  . Zoster 11/30/2012   There are no preventive care reminders to display for this patient.  Patient Care Team: Colon Branch, MD as PCP - General (Internal Medicine) Marica Otter, [place] as Consulting Physician (Optometry) Sueanne Margarita, MD as Consulting Physician (Cardiology) Laurence Spates, MD as Consulting Physician (Gastroenterology) [name], MD as Consulting Physician (General Surgery)  Indicate any recent Medical Services you may have received from other than [facility] providers in the past year (date may be approximate).    Assessment:   This is a routine wellness examination for Daniel Cruz. Physical assessment deferred to PCP.   Hearing/Vision  screen No exam data present  Dietary issues and exercise activities discussed: Current Exercise Habits: The patient does not participate in regular exercise at present  Pt states he is going to begin walking 4x/week for at least 49mn. Diet (meal preparation, eat out, water intake, caffeinated beverages, dairy products, fruits and vegetables): in general, a "healthy" diet    Drinks 3-4 glasses of water     Goals    . Weight (lb) < 245 lb (111.1 kg)          Eat smaller portions and walk 4x/week for at least 229m.      Depression Screen PHQ 2/9 Scores 03/11/2017 05/08/2016 12/22/2015 05/08/2015  PHQ - 2 Score 0 0 0 0    Fall Risk Fall Risk  03/11/2017 05/08/2016 12/22/2015 05/08/2015 01/06/2015  Falls in the past year? No No No No No    Cognitive Function: Ad8 score reviewed for issues:  Issues making decisions:no  Less interest in hobbies / activities:no  Repeats questions, stories (family complaining):no  Trouble using ordinary gadgets (microwave, computer, phone):no  Forgets the month or year: no  Mismanaging finances: no  Remembering appts:no  Daily problems with thinking and/or memory:no Ad8 score is=0         Screening Tests Health Maintenance  Topic Date Due  . PNA vac Low Risk Adult (2 of 2 - PPSV23) 08/25/2019 (Originally 08/25/2015)  . INFLUENZA VACCINE  05/07/2017  . FOOT EXAM  05/08/2017  . HEMOGLOBIN A1C  05/08/2017  . OPHTHALMOLOGY EXAM  05/15/2017  . COLONOSCOPY  01/08/2023  . TETANUS/TDAP  12/21/2025  . Hepatitis C Screening  Completed        Plan:   Follow up with PCP today as scheduled  Continue to eat heart healthy diet (full of fruits, vegetables, whole grains, lean protein, water--limit salt, fat, and sugar intake) and increase physical activity as tolerated.  Continue doing brain stimulating activities (puzzles, reading, adult coloring books, staying active) to keep memory sharp.   Bring a copy of your advance directives to your next  office visit.   I have personally reviewed and noted the following in the patient's chart:   . Medical and social history . Use of alcohol, tobacco or illicit drugs  . Current medications and supplements . Functional ability and status . Nutritional status . Physical activity . Advanced directives . List of other physicians . Hospitalizations, surgeries, and ER visits in previous 12 months . Vitals . Screenings to include cognitive, depression, and falls . Referrals and appointments  In addition, I have reviewed and discussed with patient certain preventive protocols, quality metrics, and best practice recommendations. A written personalized care plan for preventive services as well as general preventive health recommendations were provided to patient.     Naaman Plummer [place], [place]   03/11/2017    Kathlene November, MD

## 2017-03-11 ENCOUNTER — Encounter: Payer: Self-pay | Admitting: Internal Medicine

## 2017-03-11 ENCOUNTER — Ambulatory Visit (INDEPENDENT_AMBULATORY_CARE_PROVIDER_SITE_OTHER): Payer: Medicare Other | Admitting: Internal Medicine

## 2017-03-11 VITALS — BP 124/70 | HR 73 | Ht 71.0 in | Wt 266.8 lb

## 2017-03-11 DIAGNOSIS — Z0000 Encounter for general adult medical examination without abnormal findings: Secondary | ICD-10-CM

## 2017-03-11 DIAGNOSIS — I2510 Atherosclerotic heart disease of native coronary artery without angina pectoris: Secondary | ICD-10-CM

## 2017-03-11 DIAGNOSIS — E118 Type 2 diabetes mellitus with unspecified complications: Secondary | ICD-10-CM

## 2017-03-11 DIAGNOSIS — D649 Anemia, unspecified: Secondary | ICD-10-CM | POA: Diagnosis not present

## 2017-03-11 DIAGNOSIS — E785 Hyperlipidemia, unspecified: Secondary | ICD-10-CM

## 2017-03-11 DIAGNOSIS — I10 Essential (primary) hypertension: Secondary | ICD-10-CM | POA: Diagnosis not present

## 2017-03-11 DIAGNOSIS — Z125 Encounter for screening for malignant neoplasm of prostate: Secondary | ICD-10-CM | POA: Diagnosis not present

## 2017-03-11 DIAGNOSIS — Z23 Encounter for immunization: Secondary | ICD-10-CM | POA: Diagnosis not present

## 2017-03-11 DIAGNOSIS — E119 Type 2 diabetes mellitus without complications: Secondary | ICD-10-CM

## 2017-03-11 DIAGNOSIS — R05 Cough: Secondary | ICD-10-CM | POA: Diagnosis not present

## 2017-03-11 LAB — CBC WITH DIFFERENTIAL/PLATELET
BASOS ABS: 0 10*3/uL (ref 0.0–0.1)
Basophils Relative: 0.8 % (ref 0.0–3.0)
EOS ABS: 0.2 10*3/uL (ref 0.0–0.7)
Eosinophils Relative: 3.2 % (ref 0.0–5.0)
HCT: 37.9 % — ABNORMAL LOW (ref 39.0–52.0)
Hemoglobin: 12.4 g/dL — ABNORMAL LOW (ref 13.0–17.0)
LYMPHS ABS: 1 10*3/uL (ref 0.7–4.0)
Lymphocytes Relative: 18.7 % (ref 12.0–46.0)
MCHC: 32.9 g/dL (ref 30.0–36.0)
MCV: 92.1 fl (ref 78.0–100.0)
MONO ABS: 0.6 10*3/uL (ref 0.1–1.0)
MONOS PCT: 10.8 % (ref 3.0–12.0)
NEUTROS ABS: 3.7 10*3/uL (ref 1.4–7.7)
NEUTROS PCT: 66.5 % (ref 43.0–77.0)
PLATELETS: 224 10*3/uL (ref 150.0–400.0)
RBC: 4.11 Mil/uL — AB (ref 4.22–5.81)
RDW: 16.3 % — ABNORMAL HIGH (ref 11.5–15.5)
WBC: 5.6 10*3/uL (ref 4.0–10.5)

## 2017-03-11 LAB — COMPREHENSIVE METABOLIC PANEL
ALT: 17 U/L (ref 0–53)
AST: 23 U/L (ref 0–37)
Albumin: 4.2 g/dL (ref 3.5–5.2)
Alkaline Phosphatase: 60 U/L (ref 39–117)
BILIRUBIN TOTAL: 0.4 mg/dL (ref 0.2–1.2)
BUN: 24 mg/dL — ABNORMAL HIGH (ref 6–23)
CHLORIDE: 101 meq/L (ref 96–112)
CO2: 27 meq/L (ref 19–32)
Calcium: 9.4 mg/dL (ref 8.4–10.5)
Creatinine, Ser: 0.98 mg/dL (ref 0.40–1.50)
GFR: 80.78 mL/min (ref >60.00)
Glucose, Bld: 134 mg/dL — ABNORMAL HIGH (ref 70–99)
Potassium: 4.6 mEq/L (ref 3.5–5.1)
Sodium: 135 mEq/L (ref 135–145)
Total Protein: 6.8 g/dL (ref 6.0–8.3)

## 2017-03-11 LAB — LIPID PANEL
CHOL/HDL RATIO: 2
Cholesterol: 110 mg/dL (ref 0–200)
HDL: 52.8 mg/dL (ref >39.00)
LDL CALC: 44 mg/dL (ref 0–99)
NONHDL: 56.71
TRIGLYCERIDES: 62 mg/dL (ref 0.0–149.0)
VLDL: 12.4 mg/dL (ref 0.0–40.0)

## 2017-03-11 LAB — FERRITIN: FERRITIN: 8 ng/mL — AB (ref 22.0–322.0)

## 2017-03-11 LAB — IRON: IRON: 82 ug/dL (ref 42–165)

## 2017-03-11 LAB — PSA: PSA: 1.51 ng/mL (ref 0.10–4.00)

## 2017-03-11 LAB — HEMOGLOBIN A1C: HEMOGLOBIN A1C: 7 % — AB (ref 4.6–6.5)

## 2017-03-11 MED ORDER — NITROGLYCERIN 0.4 MG SL SUBL: 0.4000 mg | SUBLINGUAL_TABLET | SUBLINGUAL | 3 refills | Status: DC | PRN

## 2017-03-11 NOTE — Progress Notes (Signed)
Subjective:    Patient ID: Daniel Cruz, male    DOB: 1951-07-27, 66 y.o.   MRN: [id number]  DOS:  03/11/2017 Type of visit - description : rov Interval history: Had a URI 5 weeks ago, symptoms are not better but he still have persisting cough. Complaining of easy bruising, worse for the last 6 months, mostly at the distal arms. DM: Good med compliance, no ambulatory CBGs HTN: Good med compliance, no ambulatory BPs.   Review of Systems Denies postnasal dripping at this point. + Eyes itching and sneezing. No nasal discharge. No difficulty breathing. No chest pain. No blood in the stools or in the urine. No excessive bleeding of the gums. No nosebleeds. Denies GERD symptoms. No dysuria, gross hematuria or difficulty urinating.  Past Medical History:  Diagnosis Date  . Anemia, iron deficiency    hx  . Arthritis   . CAD (coronary artery disease)    dr Radford Pax, MI 2001 (stents x 2), syncope 2010  . Cholelithiasis   . DIABETES MELLITUS, TYPE II 02/26/2007  . Fatty liver 02/2009   per u/s  . GERD (gastroesophageal reflux disease)   . Heart murmur   . HTN (hypertension)   . Myocardial infarction ([place])   . OSA (obstructive sleep apnea)    can't tol. a CPAP  . Plantar fasciitis of left foot   . UTI (lower urinary tract infection)    in the 80s and 11/09  . UTI (urinary tract infection)    hx    Past Surgical History:  Procedure Laterality Date  . CARDIAC CATHETERIZATION  10/2008   stents  . CHOLECYSTECTOMY N/A 07/16/2016   Procedure: LAPAROSCOPIC CHOLECYSTECTOMY WITH INTRAOPERATIVE CHOLANGIOGRAM;  Surgeon: [name], MD;  Location: [facility];  Service: General;  Laterality: N/A;  . SEPTOPLASTY     w/ bilateral inferior turbinate reductions  . TONSILLECTOMY      Social History   Social History  . Marital status: Married    Spouse name: N/A  . Number of children: 2  . Years of education: N/A   Occupational History  . 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 Smoking status: Former Smoker    Packs/day: 1.50    Years: 16.00    Quit date: 07/08/2000  . Smokeless tobacco: Never Used     Comment: 2001  . Alcohol use 0.0 oz/week     Comment: socially  . Drug use: No  . Sexual activity: Yes   Other Topics Concern  . Not on file   Social History Narrative   Lives in [place], [place]   Lives w/ wife, 2 children, 2 g-kids   Retired April 2017                Allergies as of 03/11/2017      Reactions   Januvia [sitagliptin] Nausea Only, Other (See Comments)   Back Pain      Medication List       Accurate as of 03/11/17 11:59 PM. Always use your most recent med list.          aspirin EC 81 MG tablet Take 81 mg by mouth daily.   clopidogrel 75 MG tablet Commonly known as:  PLAVIX Take 1 tablet (75 mg total) by mouth daily.   ezetimibe-simvastatin 10-10 MG tablet Commonly known as:  VYTORIN Take 1 tablet by mouth daily.   glipiZIDE 5 MG 24 hr tablet  Commonly known as:  GLUCOTROL XL Take 1 tablet (5 mg total) by mouth 2 (two) times daily.   ibuprofen 200 MG tablet Commonly known as:  ADVIL,MOTRIN Take 200 mg by mouth every 6 (six) hours as needed (For pain.).   linagliptin 5 MG Tabs tablet Commonly known as:  TRADJENTA Take 1 tablet (5 mg total) by mouth daily.   lisinopril-hydrochlorothiazide 20-12.5 MG tablet Commonly known as:  PRINZIDE,ZESTORETIC Take 1 tablet by mouth daily.   multivitamin tablet Take 1 tablet by mouth daily.   nitroGLYCERIN 0.4 MG SL tablet Commonly known as:  NITROSTAT Place 1 tablet (0.4 mg total) under the tongue every 5 (five) minutes x 3 doses as needed for chest pain.   pantoprazole 40 MG tablet Commonly known as:  PROTONIX Take 40 mg by mouth 2 (two) times daily.   pioglitazone-metformin 15-850 MG tablet Commonly known as:  ACTOPLUS MET Take 1 tablet by mouth 2 (two) times daily with a meal.            Objective:   Physical Exam BP 124/70 (BP Location: Right Arm, Patient Position: Sitting, Cuff Size: Large)   Pulse 73   Ht '5\' 11"'$  (1.803 m)   Wt 266 lb 12.8 oz (121 kg)   SpO2 97%   BMI 37.21 kg/m   General:   Well developed, well nourished . NAD.  HEENT:  Normocephalic . Face symmetric, atraumatic Lungs:  CTA B Normal respiratory effort, no intercostal retractions, no accessory muscle use. Heart: RRR,  no murmur.  No pretibial edema bilaterally  Abdomen:  Not distended, soft, non-tender. No rebound or rigidity.   Skin: Exposed areas without rash. Not pale. Not jaundice Rectal:  External abnormalities: none. Normal sphincter tone. No rectal masses or tenderness.  Stool brown  Prostate: Prostate gland firm and smooth, no enlargement, nodularity, tenderness, mass, asymmetry or induration.  Neurologic:  alert & oriented X3.  Speech normal, gait appropriate for age and unassisted Strength symmetric and appropriate for age.  Psych: Cognition and judgment appear intact.  Cooperative with normal attention span and concentration.  Behavior appropriate. No anxious or depressed appearing.    Assessment & Plan:   Assessment DM DX 2008 HTN Hyperlipidemia CAD: MI 2001, syncope 2010 OSA, CPAP intolerant ED Mild chronic anemia, low iron stores  GERD Gallbladder polyps, 2014, surgery 07-2016 Fatty liver 2010 Used to see  Dr Cena Benton Medicare wellness by RN today Persistent cough after URI: Recommend Flonase, Claritin, Mucinex. Call if no better DM: Continue Glucotrol, Tradjenta and Actos plus met. Checking A1c. HTN: BP today is great, no amb BPs, continue lisinopril HCT. Checking a CMP. Hyperlipidemia: Continue Vytorin, check a FLP CAD: On aspirin and Plavix, reports excessive bruising without alarming sxs. We'll see cardiology soon, recommend to discuss with them appropriateness of continue with 2 antiplatelets. Anemia, low iron: Rechecking labs. Not on iron. Further  advise w/ results RTC 4-5 months

## 2017-03-11 NOTE — Patient Instructions (Addendum)
GO TO THE LAB : Get the blood work     GO TO THE FRONT DESK Schedule your next appointment for a  routine checkup in 4-5 months   Daniel Cruz , Thank you for taking time to come for your Medicare Wellness Visit. I appreciate your ongoing commitment to your health goals. Please review the following plan we discussed and let me know if I can assist you in the future.   These are the goals we discussed: Goals    . Weight (lb) < 245 lb (111.1 kg)          Eat smaller portions and walk 4x/week for at least 22min.       This is a list of the screening recommended for you and due dates:  Health Maintenance  Topic Date Due  . Pneumonia vaccines (2 of 2 - PPSV23) 08/25/2019*  . Flu Shot  05/07/2017  . Complete foot exam   05/08/2017  . Hemoglobin A1C  05/08/2017  . Eye exam for diabetics  05/15/2017  . Colon Cancer Screening  01/08/2023  . Tetanus Vaccine  12/21/2025  .  Hepatitis C: One time screening is recommended by Center for Disease Control  (CDC) for  adults born from [birth year] through 1967.   Completed  *Topic was postponed. The date shown is not the original due date.   Continue to eat heart healthy diet (full of fruits, vegetables, whole grains, lean protein, water--limit salt, fat, and sugar intake) and increase physical activity as tolerated.  Continue doing brain stimulating activities (puzzles, reading, adult coloring books, staying active) to keep memory sharp.   Bring a copy of your advance directives to your next office visit.

## 2017-03-11 NOTE — Assessment & Plan Note (Addendum)
--  Td 12-2015; pneumonia 08;prevnar:2015; zostavax 2014; shingrex in back order  --CCS: had Cscope 2003 negative ,again in 2014 Dr Oletta Lamas, next per GI --Prostate cancer screening: DRE wnl today, check a PSA  -- Diet and exercise discussed. He is playing golf regularly , bowling~3 times a week. Improving his diet lately. --labs: CMP, FLP, CBC, iron, ferritin, A1c, PSA

## 2017-03-12 NOTE — Assessment & Plan Note (Signed)
Medicare wellness by RN today Persistent cough after URI: Recommend Flonase, Claritin, Mucinex. Call if no better DM: Continue Glucotrol, Tradjenta and Actos plus met. Checking A1c. HTN: BP today is great, no amb BPs, continue lisinopril HCT. Checking a CMP. Hyperlipidemia: Continue Vytorin, check a FLP CAD: On aspirin and Plavix, reports excessive bruising without alarming sxs. We'll see cardiology soon, recommend to discuss with them appropriateness of continue with 2 antiplatelets. Anemia, low iron: Rechecking labs. Not on iron. Further advise w/ results RTC 4-5 months

## 2017-04-15 DIAGNOSIS — K219 Gastro-esophageal reflux disease without esophagitis: Secondary | ICD-10-CM | POA: Diagnosis not present

## 2017-04-15 DIAGNOSIS — E611 Iron deficiency: Secondary | ICD-10-CM | POA: Diagnosis not present

## 2017-04-15 DIAGNOSIS — K8020 Calculus of gallbladder without cholecystitis without obstruction: Secondary | ICD-10-CM | POA: Diagnosis not present

## 2017-04-17 DIAGNOSIS — L821 Other seborrheic keratosis: Secondary | ICD-10-CM | POA: Diagnosis not present

## 2017-04-17 DIAGNOSIS — L309 Dermatitis, unspecified: Secondary | ICD-10-CM | POA: Diagnosis not present

## 2017-04-17 DIAGNOSIS — L820 Inflamed seborrheic keratosis: Secondary | ICD-10-CM | POA: Diagnosis not present

## 2017-04-17 DIAGNOSIS — L0100 Impetigo, unspecified: Secondary | ICD-10-CM | POA: Diagnosis not present

## 2017-04-17 DIAGNOSIS — D1801 Hemangioma of skin and subcutaneous tissue: Secondary | ICD-10-CM | POA: Diagnosis not present

## 2017-04-17 DIAGNOSIS — D225 Melanocytic nevi of trunk: Secondary | ICD-10-CM | POA: Diagnosis not present

## 2017-04-17 DIAGNOSIS — L918 Other hypertrophic disorders of the skin: Secondary | ICD-10-CM | POA: Diagnosis not present

## 2017-04-17 DIAGNOSIS — L570 Actinic keratosis: Secondary | ICD-10-CM | POA: Diagnosis not present

## 2017-04-17 DIAGNOSIS — L814 Other melanin hyperpigmentation: Secondary | ICD-10-CM | POA: Diagnosis not present

## 2017-05-08 DIAGNOSIS — H5212 Myopia, left eye: Secondary | ICD-10-CM | POA: Diagnosis not present

## 2017-05-08 DIAGNOSIS — H5201 Hypermetropia, right eye: Secondary | ICD-10-CM | POA: Diagnosis not present

## 2017-05-08 DIAGNOSIS — Z7984 Long term (current) use of oral hypoglycemic drugs: Secondary | ICD-10-CM | POA: Diagnosis not present

## 2017-05-08 DIAGNOSIS — E119 Type 2 diabetes mellitus without complications: Secondary | ICD-10-CM | POA: Diagnosis not present

## 2017-05-08 DIAGNOSIS — H52223 Regular astigmatism, bilateral: Secondary | ICD-10-CM | POA: Diagnosis not present

## 2017-05-08 LAB — HM DIABETES EYE EXAM

## 2017-05-14 ENCOUNTER — Encounter: Payer: Self-pay | Admitting: Internal Medicine

## 2017-05-19 ENCOUNTER — Encounter: Payer: Self-pay | Admitting: Cardiology

## 2017-05-21 IMAGING — US US ABDOMEN COMPLETE
1 series · 13 of 25 positions shown · non-contrast
Comparison: Ultrasound of the abdomen of 08/18/2014

CLINICAL DATA: Adenomyomatosis of the gallbladder, followup

EXAM:
ABDOMEN ULTRASOUND COMPLETE

[Series 1: us abdomen complete · 0.32mm/px · 13 of 103 slices shown]
[im 1/103]
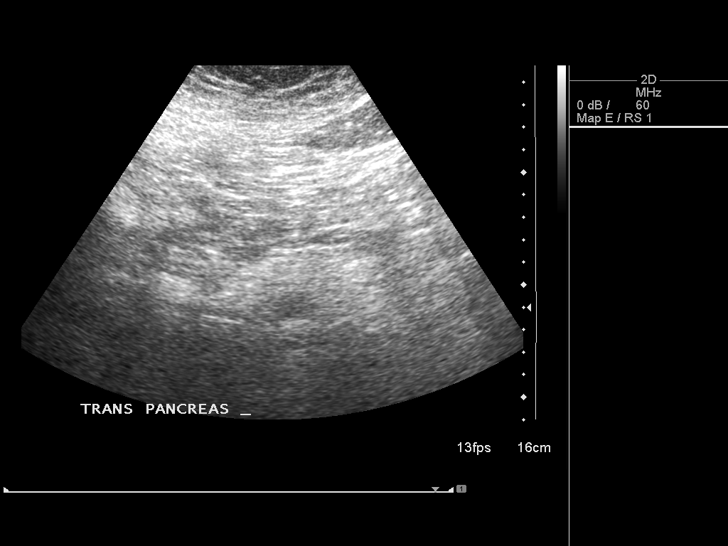
[im 9/103]
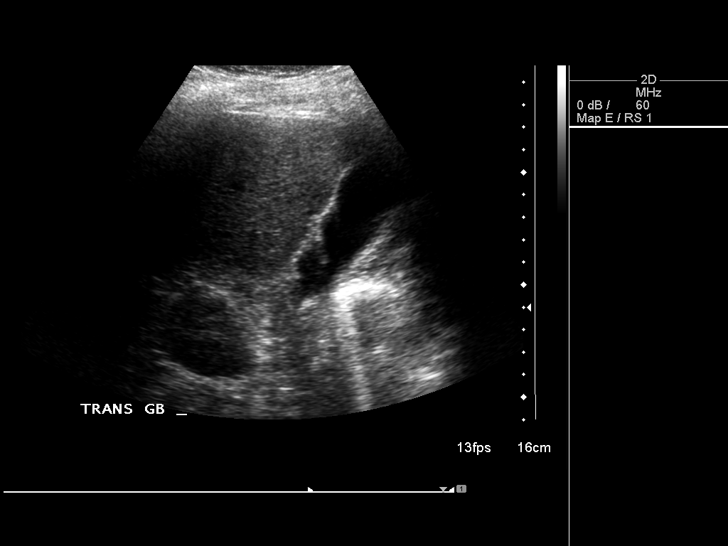
[im 18/103]
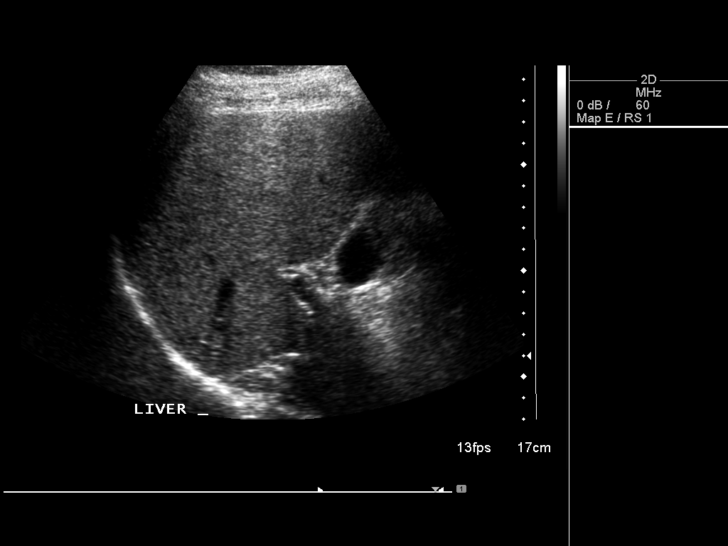
[im 26/103]
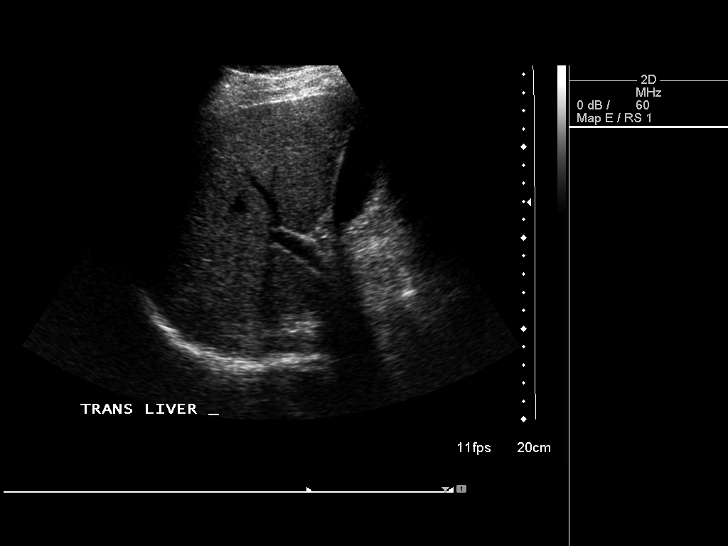
[im 35/103]
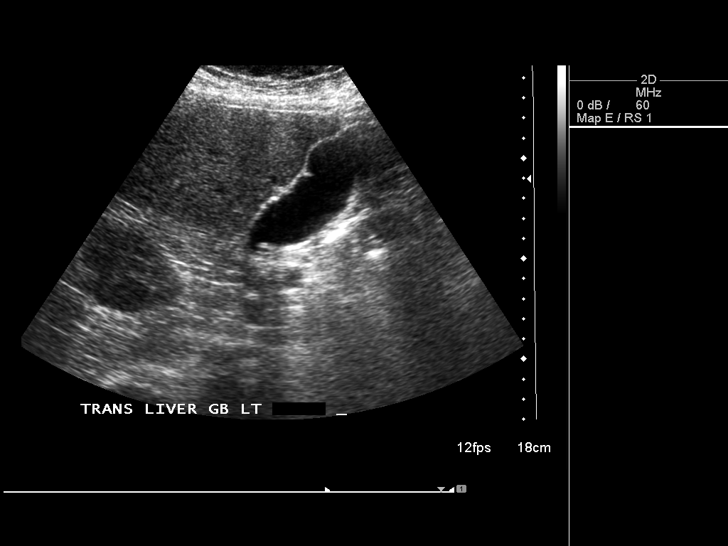
[im 43/103]
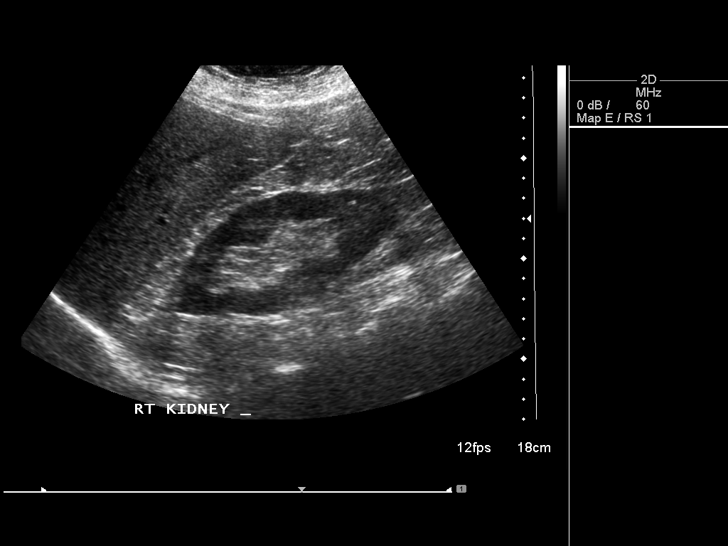
[im 52/103]
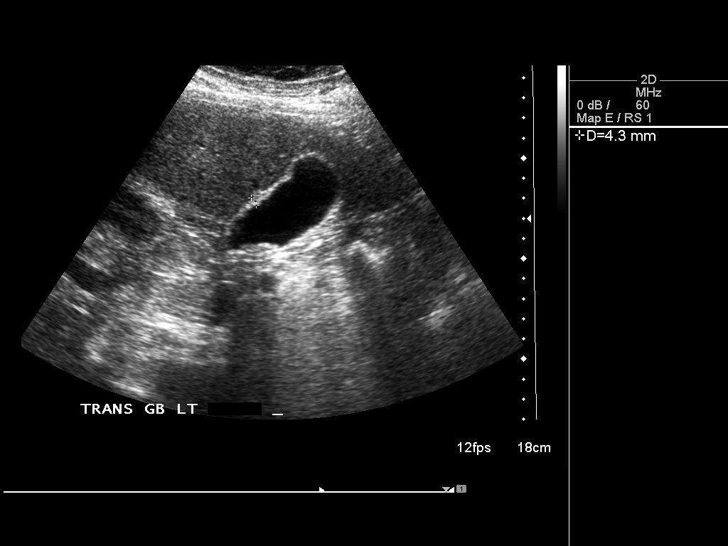
[im 60/103]
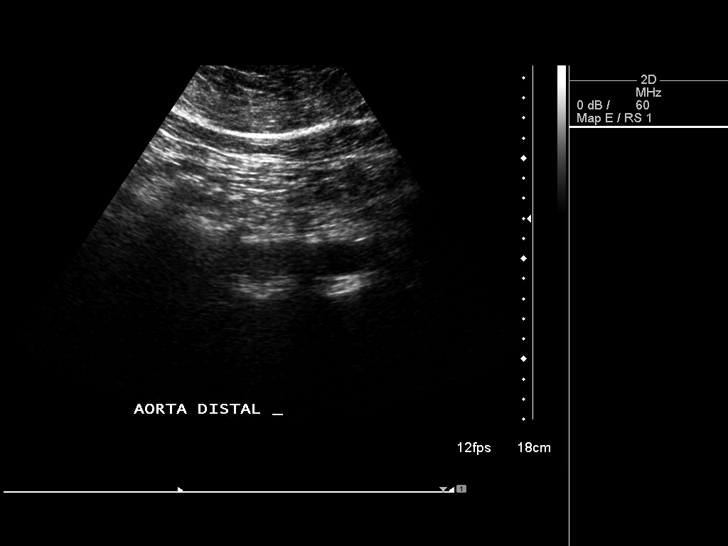
[im 69/103]
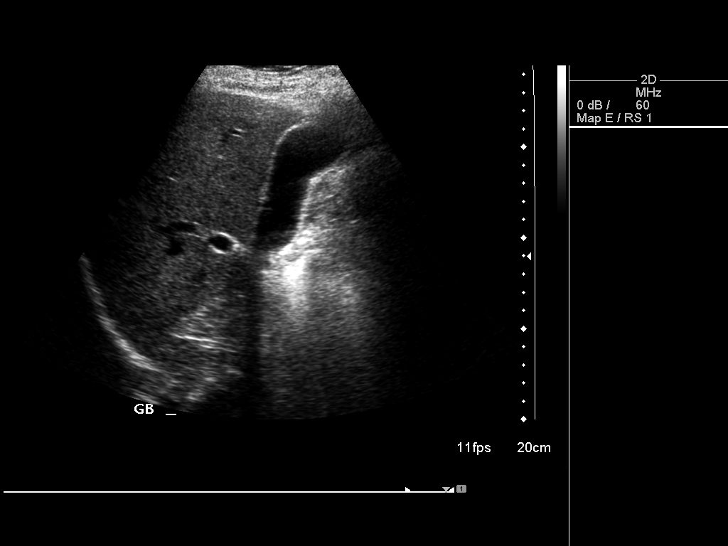
[im 77/103]
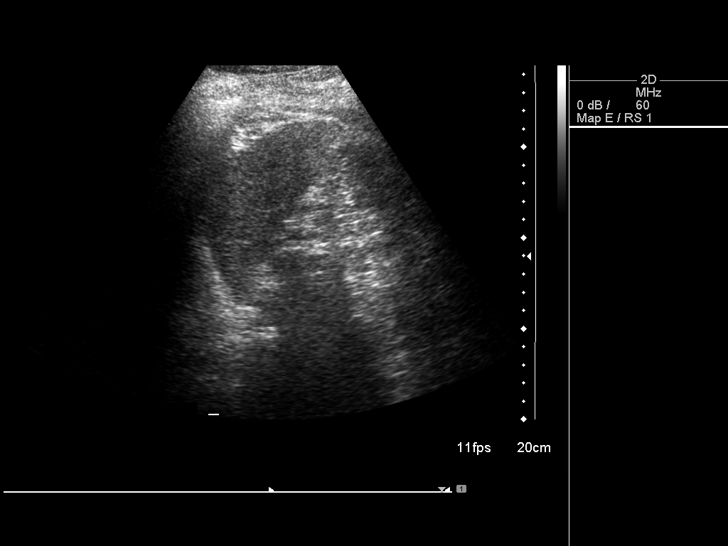
[im 86/103]
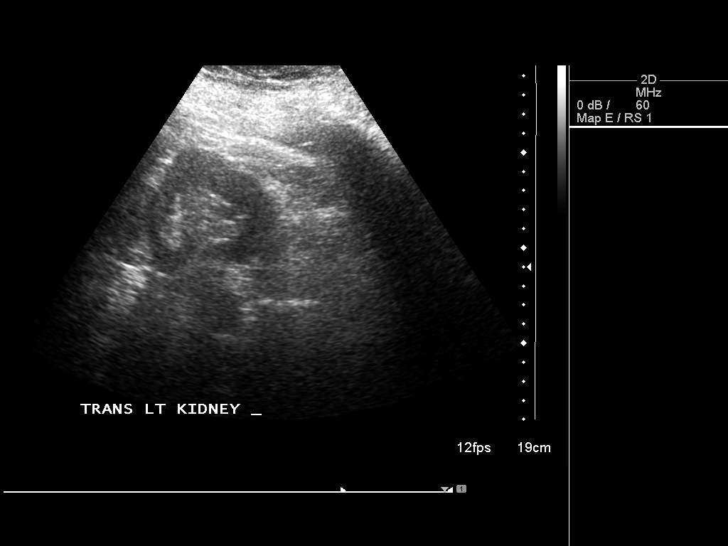
[im 94/103]
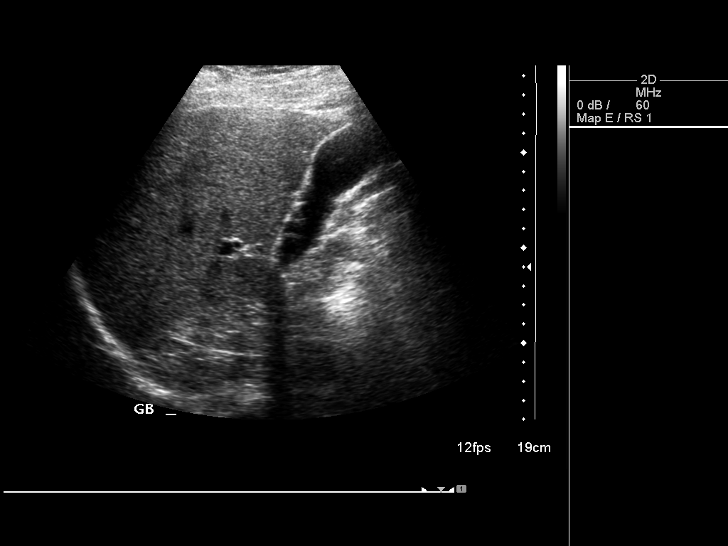
[im 103/103]
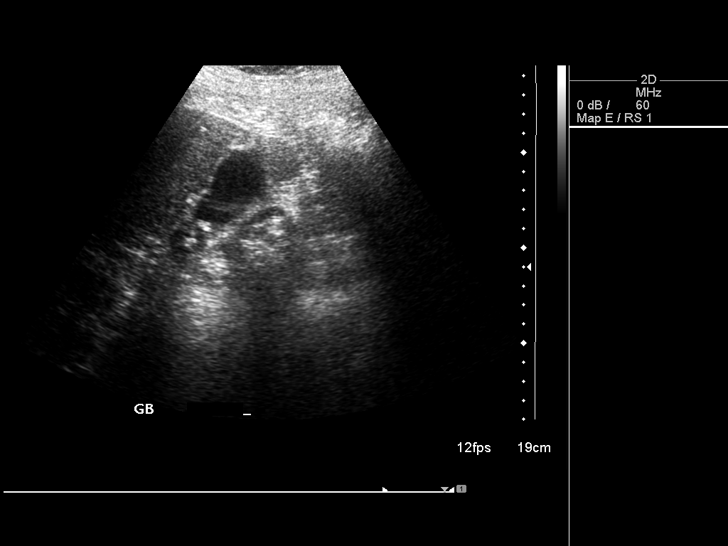

[13 of 25 positions shown; findings below may reference images not displayed]

FINDINGS: Gallbladder: Small gallstones layer within the gallbladder. Also,
some nodularity the gallbladder wall is again noted with ring down
artifact consistent with changes of gallbladder adenomyomatosis.
There is no pain over the gallbladder with compression.

Common bile duct: Diameter: Common bile duct is normal measuring
mm in diameter.

Liver: The liver has a normal echogenic pattern. No focal hepatic
abnormality is seen.

IVC: The IVC is partially obscured by bowel gas.

Pancreas: Much of the pancreas also is obscured by bowel gas.

Spleen: The spleen is normal measuring 4.5 cm.

Right Kidney: Length: 12.3 cm..  No hydronephrosis is seen.

Left Kidney: Length: 12.2 cm..  No hydronephrosis is noted.

Abdominal aorta: The abdominal aorta is normal in caliber although
partially obscured by bowel gas and body habitus.

Other findings: None.
IMPRESSION: 1. Small gallstones layer within the gallbladder.
2. Again there are changes of gallbladder adenomyomatosis present.
There is no pain over the gallbladder.
3. Portions of the pancreas and abdominal aorta are obscured by
bowel gas.

## 2017-06-06 ENCOUNTER — Ambulatory Visit (INDEPENDENT_AMBULATORY_CARE_PROVIDER_SITE_OTHER): Payer: Medicare Other | Admitting: Cardiology

## 2017-06-06 ENCOUNTER — Encounter: Payer: Self-pay | Admitting: Cardiology

## 2017-06-06 VITALS — BP 122/62 | HR 67 | Ht 71.0 in | Wt 266.8 lb

## 2017-06-06 DIAGNOSIS — I493 Ventricular premature depolarization: Secondary | ICD-10-CM | POA: Diagnosis not present

## 2017-06-06 DIAGNOSIS — E7800 Pure hypercholesterolemia, unspecified: Secondary | ICD-10-CM

## 2017-06-06 DIAGNOSIS — I2510 Atherosclerotic heart disease of native coronary artery without angina pectoris: Secondary | ICD-10-CM

## 2017-06-06 DIAGNOSIS — I10 Essential (primary) hypertension: Secondary | ICD-10-CM

## 2017-06-06 HISTORY — DX: Ventricular premature depolarization: I49.3

## 2017-06-06 LAB — BASIC METABOLIC PANEL
BUN/Creatinine Ratio: 17 (ref 10–24)
BUN: 18 mg/dL (ref 8–27)
CALCIUM: 9.1 mg/dL (ref 8.6–10.2)
CO2: 21 mmol/L (ref 20–29)
CREATININE: 1.03 mg/dL (ref 0.76–1.27)
Chloride: 103 mmol/L (ref 96–106)
GFR calc Af Amer: 86 mL/min/{1.73_m2} (ref >59)
GFR, EST NON AFRICAN AMERICAN: 74 mL/min/{1.73_m2} (ref >59)
GLUCOSE: 103 mg/dL — AB (ref 65–99)
Potassium: 4.5 mmol/L (ref 3.5–5.2)
SODIUM: 140 mmol/L (ref 134–144)

## 2017-06-06 LAB — TSH: TSH: 1.34 u[IU]/mL (ref 0.450–4.500)

## 2017-06-06 LAB — MAGNESIUM: MAGNESIUM: 1.9 mg/dL (ref 1.6–2.3)

## 2017-06-06 NOTE — Patient Instructions (Addendum)
Medication Instructions:   STOP PLAVIX  Labwork:  Your physician recommends that you HAVE LAB WORK TODAY  Testing/Procedures:  Your physician has recommended that you wear a 24 HOUR holter monitor. Holter monitors are medical devices that record the heart's electrical activity. Doctors most often use these monitors to diagnose arrhythmias. Arrhythmias are problems with the speed or rhythm of the heartbeat. The monitor is a small, portable device. You can wear one while you do your normal daily activities. This is usually used to diagnose what is causing palpitations/syncope (passing out). SCHEDULE IN 2 WEEKS    Follow-Up:  Your physician wants you to follow-up in: [place] will receive a reminder letter in the mail two months in advance. If you don't receive a letter, please call our office to schedule the follow-up appointment.   If you need a refill on your cardiac medications before your next appointment, please call your pharmacy.

## 2017-06-06 NOTE — Progress Notes (Signed)
Cardiology Office Note:    Date:  06/06/2017   ID:  [name], DOB 1951/08/19, MRN [id number]  PCP:  Colon Branch, MD  Cardiologist:  Fransico Him, MD   Referring MD: Colon Branch, MD   Chief Complaint  Patient presents with  . Coronary Artery Disease  . Hypertension  . Hyperlipidemia    History of Present Illness:    Daniel Cruz is a 66 y.o. male with a hx of ASCAD with NSTEMI 2001 with PCI of the mid and distal RCA with 50-60% prox LAD and subsequent PCI of 80% LAD and right PDA 01/2009, diabetes, hypertension, hyperlipidemia, obstructive sleep apnea and GERD who presents for followup.  He is doing well. He denies any typical angina chest pain, SOB, DOE (except with extreme exertion), LE edema, dizziness, palpitations or syncope.  He walks 2 miles 3-4 time weekly without any problems.    Past Medical History:  Diagnosis Date  . Anemia, iron deficiency    hx  . Arthritis   . CAD (coronary artery disease)     NSTEMI with PCI of the mid and distal RCA with 50-60% LAD 09/2000, s/p cath 01/2009 with patent RCA stent and 90% LAD s/p PCI of LAD and right PDA  . Cholelithiasis   . DIABETES MELLITUS, TYPE II 02/26/2007  . Fatty liver 02/2009   per u/s  . GERD (gastroesophageal reflux disease)   . HTN (hypertension)   . OSA (obstructive sleep apnea)    can't tol. a CPAP  . Plantar fasciitis of left foot   . PVC (premature ventricular contraction) 06/06/2017  . UTI (lower urinary tract infection)    in the 80s and 11/09    Past Surgical History:  Procedure Laterality Date  . CARDIAC CATHETERIZATION  10/2008   stents  . CHOLECYSTECTOMY N/A 07/16/2016   Procedure: LAPAROSCOPIC CHOLECYSTECTOMY WITH INTRAOPERATIVE CHOLANGIOGRAM;  Surgeon: [name], MD;  Location: [facility];  Service: General;  Laterality: N/A;  . SEPTOPLASTY     w/ bilateral inferior turbinate reductions  . TONSILLECTOMY      Current Medications: Current Meds  Medication Sig  . aspirin EC 81 MG  tablet Take 81 mg by mouth daily.  . clopidogrel (PLAVIX) 75 MG tablet Take 1 tablet (75 mg total) by mouth daily.  Marland Kitchen ezetimibe-simvastatin (VYTORIN) 10-10 MG tablet Take 1 tablet by mouth daily.  Marland Kitchen glipiZIDE (GLUCOTROL XL) 5 MG 24 hr tablet Take 1 tablet (5 mg total) by mouth 2 (two) times daily.  Marland Kitchen ibuprofen (ADVIL,MOTRIN) 200 MG tablet Take 200 mg by mouth every 6 (six) hours as needed (For pain.).  Marland Kitchen linagliptin (TRADJENTA) 5 MG TABS tablet Take 1 tablet (5 mg total) by mouth daily.  Marland Kitchen lisinopril-hydrochlorothiazide (PRINZIDE,ZESTORETIC) 20-12.5 MG tablet Take 1 tablet by mouth daily.  . Multiple Vitamin (MULTIVITAMIN) tablet Take 1 tablet by mouth daily.    . nitroGLYCERIN (NITROSTAT) 0.4 MG SL tablet Place 1 tablet (0.4 mg total) under the tongue every 5 (five) minutes x 3 doses as needed for chest pain.  . pantoprazole (PROTONIX) 40 MG tablet Take 40 mg by mouth 2 (two) times daily.  . pioglitazone-metformin (ACTOPLUS MET) 15-850 MG tablet Take 1 tablet by mouth 2 (two) times daily with a meal.     Allergies:   Januvia [sitagliptin]   Social History   Social History  . Marital status: Married    Spouse name: N/A  . Number of children: 2  . Years of education: N/A  Occupational History  . 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 Smoking status: Former Smoker    Packs/day: 1.50    Years: 16.00    Quit date: 07/08/2000  . Smokeless tobacco: Never Used     Comment: 2001  . Alcohol use 0.0 oz/week     Comment: socially  . Drug use: No  . Sexual activity: Yes   Other Topics Concern  . None   Social History Narrative   Lives in [place], [place]   Lives w/ wife, 2 children, 2 g-kids   Retired April 2017               Family History: The patient's family history includes COPD in his mother; Coronary artery disease in his father; Emphysema in his mother; Stroke in his father. There is no history of  Cancer or Diabetes.  ROS:   Please see the history of present illness.     All other systems reviewed and are negative.  EKGs/Labs/Other Studies Reviewed:    The following studies were reviewed today: EKG  EKG:  EKG is  ordered today.  The ekg ordered today demonstrates NSR at 67bpm with bigeminal PVCs  Recent Labs: 03/11/2017: ALT 17; BUN 24; Creatinine, Ser 0.98; Hemoglobin 12.4; Platelets 224.0; Potassium 4.6; Sodium 135   Recent Lipid Panel    Component Value Date/Time   CHOL 110 03/11/2017 1043   TRIG 62.0 03/11/2017 1043   TRIG 106 09/15/2006 0947   HDL 52.80 03/11/2017 1043   CHOLHDL 2 03/11/2017 1043   VLDL 12.4 03/11/2017 1043   LDLCALC 44 03/11/2017 1043    Physical Exam:    VS:  BP 122/62   Pulse 67   Ht _0  (1.803 m)   Wt 266 lb 12.8 oz (121 kg)   SpO2 97%   BMI 37.21 kg/m     Wt Readings from Last 3 Encounters:  06/06/17 266 lb 12.8 oz (121 kg)  03/11/17 266 lb 12.8 oz (121 kg)  11/08/16 261 lb 2 oz (118.4 kg)     GEN:  Well nourished, well developed in no acute distress HEENT: Normal NECK: No JVD; No carotid bruits LYMPHATICS: No lymphadenopathy CARDIAC: RRR, no murmurs, rubs, gallops RESPIRATORY:  Clear to auscultation without rales, wheezing or rhonchi  ABDOMEN: Soft, non-tender, non-distended MUSCULOSKELETAL:  No edema; No deformity  SKIN: Warm and dry NEUROLOGIC:  Alert and oriented x 3 PSYCHIATRIC:  Normal affect   ASSESSMENT:    1. Coronary artery disease involving native coronary artery of native heart without angina pectoris   2. PVC (premature ventricular contraction)   3. Essential hypertension   4. Pure hypercholesterolemia    PLAN:    In order of problems listed above:   1.  ASCAD s/p NSTEMI 2001 with PCI of the mid and distal RCA with 50-60% prox LAD and subsequent PCI of 80% LAD and right PDA 01/2009,   He will continue with ASA and I told him he was fine to stop Plavix as he is having a lot of bruising.   Continue  statin.   2.  PVC's - bigeminal on EKG - I will get a holter monitor to assess PVC load.   3.  HTN - BP controlled on current medical regimen.  Continue ACE I and diuretic.  4.  Hyperlipidemia - LDL goal < 70.  Continue statin.  LDL in June was 44.  Medication Adjustments/Labs and Tests Ordered: Current medicines are reviewed at length with the patient today.  Concerns regarding medicines are outlined above.  No orders of the defined types were placed in this encounter.  No orders of the defined types were placed in this encounter.   Signed, Fransico Him, MD  06/06/2017 9:04 AM    [place]

## 2017-06-20 ENCOUNTER — Ambulatory Visit (INDEPENDENT_AMBULATORY_CARE_PROVIDER_SITE_OTHER): Payer: Medicare Other

## 2017-06-20 DIAGNOSIS — I493 Ventricular premature depolarization: Secondary | ICD-10-CM | POA: Diagnosis not present

## 2017-06-25 ENCOUNTER — Other Ambulatory Visit: Payer: Self-pay

## 2017-06-25 ENCOUNTER — Encounter: Payer: Self-pay | Admitting: Cardiology

## 2017-06-25 ENCOUNTER — Telehealth: Payer: Self-pay

## 2017-06-25 MED ORDER — METOPROLOL SUCCINATE ER 25 MG PO TB24: 25.0000 mg | ORAL_TABLET | Freq: Every day | ORAL | 3 refills | Status: DC

## 2017-06-25 MED ORDER — METOPROLOL SUCCINATE ER 25 MG PO TB24: 25.0000 mg | ORAL_TABLET | Freq: Every day | ORAL | 0 refills | Status: DC

## 2017-06-25 NOTE — Telephone Encounter (Signed)
-----   Message from Sueanne Margarita, MD sent at 06/25/2017 10:52 AM EDT ----- Heart monitor showed frequent PVCs but PVC load on 7%, PACs, nonsustained atrial tachycardia.  Add Toprol XL 25mg  daily.

## 2017-06-25 NOTE — Telephone Encounter (Signed)
Pt aware of holter monitor results and Dr.Turner's recommendation.  Heart monitor showed frequent PVCs but PVC load on 7%, PACs, nonsustained atrial tachycardia. Add Toprol XL 25mg  daily.  Pt is agreeable to start Toprol XL 25mg  daily. Rx sent to pt local pharmacy for #30 R-0. Additional refills sent to pt mail order pharmacy. Pt has no additional questions at this time. Adv pt to contact the office if any concerns. Pt verbalized understanding.

## 2017-07-14 DIAGNOSIS — Z23 Encounter for immunization: Secondary | ICD-10-CM | POA: Diagnosis not present

## 2017-07-18 ENCOUNTER — Other Ambulatory Visit: Payer: Self-pay | Admitting: *Deleted

## 2017-07-18 MED ORDER — METOPROLOL SUCCINATE ER 25 MG PO TB24: 25.0000 mg | ORAL_TABLET | Freq: Every day | ORAL | 2 refills | Status: DC

## 2017-07-22 ENCOUNTER — Encounter: Payer: Self-pay | Admitting: *Deleted

## 2017-07-22 ENCOUNTER — Emergency Department (HOSPITAL_COMMUNITY)
Admission: EM | Admit: 2017-07-22 | Discharge: 2017-07-22 | Disposition: A | Discharge status: Home / Self Care | Payer: Medicare Other | Attending: Emergency Medicine | Admitting: Emergency Medicine

## 2017-07-22 ENCOUNTER — Telehealth: Payer: Self-pay | Admitting: Cardiology

## 2017-07-22 ENCOUNTER — Emergency Department (HOSPITAL_COMMUNITY): Payer: Medicare Other

## 2017-07-22 ENCOUNTER — Encounter (HOSPITAL_COMMUNITY): Payer: Self-pay

## 2017-07-22 DIAGNOSIS — R079 Chest pain, unspecified: Secondary | ICD-10-CM

## 2017-07-22 DIAGNOSIS — R0789 Other chest pain: Secondary | ICD-10-CM | POA: Insufficient documentation

## 2017-07-22 DIAGNOSIS — I10 Essential (primary) hypertension: Secondary | ICD-10-CM | POA: Diagnosis not present

## 2017-07-22 DIAGNOSIS — R9431 Abnormal electrocardiogram [ECG] [EKG]: Secondary | ICD-10-CM | POA: Diagnosis not present

## 2017-07-22 DIAGNOSIS — I2510 Atherosclerotic heart disease of native coronary artery without angina pectoris: Secondary | ICD-10-CM | POA: Diagnosis not present

## 2017-07-22 DIAGNOSIS — E119 Type 2 diabetes mellitus without complications: Secondary | ICD-10-CM | POA: Diagnosis not present

## 2017-07-22 DIAGNOSIS — R0602 Shortness of breath: Secondary | ICD-10-CM | POA: Insufficient documentation

## 2017-07-22 DIAGNOSIS — Z87891 Personal history of nicotine dependence: Secondary | ICD-10-CM | POA: Diagnosis not present

## 2017-07-22 DIAGNOSIS — Z7982 Long term (current) use of aspirin: Secondary | ICD-10-CM | POA: Insufficient documentation

## 2017-07-22 DIAGNOSIS — Z7984 Long term (current) use of oral hypoglycemic drugs: Secondary | ICD-10-CM | POA: Diagnosis not present

## 2017-07-22 DIAGNOSIS — Z79899 Other long term (current) drug therapy: Secondary | ICD-10-CM | POA: Diagnosis not present

## 2017-07-22 LAB — CBC
HEMATOCRIT: 40.1 % (ref 39.0–52.0)
HEMOGLOBIN: 12.8 g/dL — AB (ref 13.0–17.0)
MCH: 29.4 pg (ref 26.0–34.0)
MCHC: 31.9 g/dL (ref 30.0–36.0)
MCV: 92 fL (ref 78.0–100.0)
Platelets: 223 10*3/uL (ref 150–400)
RBC: 4.36 MIL/uL (ref 4.22–5.81)
RDW: 15.9 % — AB (ref 11.5–15.5)
WBC: 6.5 10*3/uL (ref 4.0–10.5)

## 2017-07-22 LAB — I-STAT TROPONIN, ED
Troponin i, poc: 0 ng/mL (ref 0.00–0.08)
Troponin i, poc: 0 ng/mL (ref 0.00–0.08)

## 2017-07-22 LAB — BASIC METABOLIC PANEL
ANION GAP: 7 (ref 5–15)
BUN: 11 mg/dL (ref 6–20)
CHLORIDE: 104 mmol/L (ref 101–111)
CO2: 25 mmol/L (ref 22–32)
Calcium: 9.1 mg/dL (ref 8.9–10.3)
Creatinine, Ser: 1 mg/dL (ref 0.61–1.24)
GFR calc Af Amer: >60 mL/min (ref >60)
GFR calc non Af Amer: >60 mL/min (ref >60)
GLUCOSE: 158 mg/dL — AB (ref 65–99)
POTASSIUM: 4.2 mmol/L (ref 3.5–5.1)
Sodium: 136 mmol/L (ref 135–145)

## 2017-07-22 MED ORDER — ASPIRIN 81 MG PO CHEW
324.0000 mg | CHEWABLE_TABLET | Freq: Once | ORAL | Status: AC
  Administered 2017-07-22: 324 mg via ORAL
  Filled 2017-07-22: qty 4

## 2017-07-22 MED ORDER — DIAZEPAM 5 MG PO TABS: 5.0000 mg | ORAL_TABLET | Freq: Once | ORAL | 0 refills | Status: AC

## 2017-07-22 NOTE — ED Provider Notes (Signed)
[facility] EMERGENCY DEPARTMENT Provider Note   CSN: [id number] Arrival date & time: 07/22/17  4010     History   Chief Complaint No chief complaint on file.   HPI Daniel Cruz is a 66 y.o. male.  HPI: patient presents for evaluation of brief episodes of chest pain and shortness of breath since this morning.  Patient has history of coronary artery disease disease dating back to 2001. He follows with Dr. Radford Pax.  He awakened at 04 100 this morning with a brief episode of shortness of breath and tightness in his chest that lasted for a minute or 2. This recurred here 4 times over the course of the morning. He was not exerting. He was driving his granddaughter to school. He states the longest episode was "a couple of minutes".  He is pain-free now. He tried an antacid which she does not think helped. His symptoms do not remind him of the way he felt when he had in STEMI in 2001. He states at that time he "just felt like shit".    NSTEMI 2001 with PCI of the mid and distal RCA with 50-60% prox LAD and subsequent PCI of 80% LAD and right PDA 01/2009, diabetes, hypertension, hyperlipidemia, obstructive sleep apnea and GERD   He is doing well. He denies any typical angina chest pain, SOB, DOE (except with extreme exertion), LE edema, dizziness, palpitations or syncope. He walks 2 miles 3-4 time weekly without any problems.    Past Medical History:  Diagnosis Date  . Anemia, iron deficiency    hx  . CAD (coronary artery disease)     NSTEMI with PCI of the mid and distal RCA with 50-60% LAD 09/2000, s/p cath 01/2009 with patent RCA stent and 90% LAD s/p PCI of LAD and right PDA  . Cholelithiasis   . DIABETES MELLITUS, TYPE II 02/26/2007  . Fatty liver 02/2009   per u/s  . GERD (gastroesophageal reflux disease)   . HTN (hypertension)   . OSA (obstructive sleep apnea)    can't tol. a CPAP  . Plantar fasciitis of left foot   . PVC (premature ventricular  contraction) 06/06/2017  . UTI (lower urinary tract infection)    in the 80s and 11/09    Patient Active Problem List   Diagnosis Date Noted  . PVC (premature ventricular contraction) 06/06/2017  . PCP NOTES >>>>>>>>>>>>>>>>>>>>>>>>> 11/20/2015  . Anemia 05/08/2015  . GERD, GB polyps  07/29/2013  . Annual physical exam 05/15/2011  . Pain in joint, ankle and foot 03/19/2010  . ERECTILE DYSFUNCTION 11/08/2008  . Hyperlipidemia 07/20/2007  . DM (diabetes mellitus) with complications ([place]) 27/25/3664  . HTN (hypertension) 02/26/2007  . CAD (coronary artery disease) 02/26/2007  . GERD 02/26/2007  . SLEEP APNEA 02/26/2007    Past Surgical History:  Procedure Laterality Date  . CARDIAC CATHETERIZATION  10/2008   stents  . CHOLECYSTECTOMY N/A 07/16/2016   Procedure: LAPAROSCOPIC CHOLECYSTECTOMY WITH INTRAOPERATIVE CHOLANGIOGRAM;  Surgeon: [name], MD;  Location: [facility];  Service: General;  Laterality: N/A;  . SEPTOPLASTY     w/ bilateral inferior turbinate reductions  . TONSILLECTOMY         Home Medications    Prior to Admission medications   Medication Sig Start Date End Date Taking? Authorizing Provider  aspirin EC 81 MG tablet Take 81 mg by mouth daily.   Yes [provider]  ezetimibe-simvastatin (VYTORIN) 10-10 MG tablet Take 1 tablet by mouth daily. 11/08/16  Yes Paz, Alda Berthold, MD  FLUZONE HIGH-DOSE 0.5 ML injection Inject 1 each as directed once. 07/14/17  Yes [provider]  glipiZIDE (GLUCOTROL XL) 5 MG 24 hr tablet Take 1 tablet (5 mg total) by mouth 2 (two) times daily. 12/06/16  Yes Paz, Alda Berthold, MD  ibuprofen (ADVIL,MOTRIN) 200 MG tablet Take 200 mg by mouth every 6 (six) hours as needed (For pain.).   Yes [provider]  linagliptin (TRADJENTA) 5 MG TABS tablet Take 1 tablet (5 mg total) by mouth daily. 11/27/16  Yes Paz, Alda Berthold, MD  lisinopril-hydrochlorothiazide (PRINZIDE,ZESTORETIC) 20-12.5 MG tablet Take 1 tablet by mouth daily. 11/08/16   Yes Paz, Alda Berthold, MD  metoprolol succinate (TOPROL XL) 25 MG 24 hr tablet Take 1 tablet (25 mg total) by mouth daily. 07/18/17  Yes Turner, Eber Hong, MD  Multiple Vitamin (MULTIVITAMIN) tablet Take 1 tablet by mouth daily.     Yes [provider]  nitroGLYCERIN (NITROSTAT) 0.4 MG SL tablet Place 1 tablet (0.4 mg total) under the tongue every 5 (five) minutes x 3 doses as needed for chest pain. 03/11/17  Yes Paz, Alda Berthold, MD  pantoprazole (PROTONIX) 40 MG tablet Take 40 mg by mouth 2 (two) times daily. 07/21/13  Yes Sueanne Margarita, MD  pioglitazone-metformin (ACTOPLUS MET) 15-850 MG tablet Take 1 tablet by mouth 2 (two) times daily with a meal. 11/08/16  Yes Colon Branch, MD    Family History Family History  Problem Relation Age of Onset  . Emphysema Mother        died  . COPD Mother   . Stroke Father   . Coronary artery disease Father   . Cancer Neg Hx        colon ,prostate  . Diabetes Neg Hx     Social History Social History  Substance Use Topics  . Smoking status: Former Smoker    Packs/day: 1.50    Years: 16.00    Quit date: 07/08/2000  . Smokeless tobacco: Never Used     Comment: 2001  . Alcohol use 0.0 oz/week     Comment: socially     Allergies   Januvia [sitagliptin]   Review of Systems Review of Systems  Constitutional: Negative for appetite change, chills, diaphoresis, fatigue and fever.  HENT: Negative for mouth sores, sore throat and trouble swallowing.   Eyes: Negative for visual disturbance.  Respiratory: Positive for shortness of breath. Negative for cough, chest tightness and wheezing.   Cardiovascular: Positive for chest pain.  Gastrointestinal: Negative for abdominal distention, abdominal pain, diarrhea, nausea and vomiting.  Endocrine: Negative for polydipsia, polyphagia and polyuria.  Genitourinary: Negative for dysuria, frequency and hematuria.  Musculoskeletal: Negative for gait problem.  Skin: Negative for color change, pallor and rash.    Neurological: Negative for dizziness, syncope, light-headedness and headaches.  Hematological: Does not bruise/bleed easily.  Psychiatric/Behavioral: Negative for behavioral problems and confusion.     Physical Exam Updated Vital Signs BP (!) 133/92   Pulse 69   Temp 98.2 F (36.8 C) (Oral)   Resp (!) 21   SpO2 100%   Physical Exam  Constitutional: He is oriented to person, place, and time. He appears well-developed and well-nourished. No distress.  HENT:  Head: Normocephalic.  Eyes: Pupils are equal, round, and reactive to light. Conjunctivae are normal. No scleral icterus.  Neck: Normal range of motion. Neck supple. No thyromegaly present.  Cardiovascular: Normal rate and regular rhythm.  Exam reveals no gallop and  no friction rub.   No murmur heard. Pulmonary/Chest: Effort normal and breath sounds normal. No respiratory distress. He has no wheezes. He has no rales.  Abdominal: Soft. Bowel sounds are normal. He exhibits no distension. There is no tenderness. There is no rebound.  Musculoskeletal: Normal range of motion.  Neurological: He is alert and oriented to person, place, and time.  Skin: Skin is warm and dry. No rash noted.  Psychiatric: He has a normal mood and affect. His behavior is normal.     ED Treatments / Results  Labs (all labs ordered are listed, but only abnormal results are displayed) Labs Reviewed  BASIC METABOLIC PANEL - Abnormal; Notable for the following:       Result Value   Glucose, Bld 158 (*)    All other components within normal limits  CBC - Abnormal; Notable for the following:    Hemoglobin 12.8 (*)    RDW 15.9 (*)    All other components within normal limits  I-STAT TROPONIN, ED  I-STAT TROPONIN, ED    EKG  EKG Interpretation  Date/Time:  Tuesday July 22 2017 08:38:26 EDT Ventricular Rate:  71 PR Interval:  164 QRS Duration: 92 QT Interval:  410 QTC Calculation: 445 R Axis:   -33 Text Interpretation:  Sinus rhythm with  occasional Premature ventricular complexes Left axis deviation Abnormal ECG Confirmed by Tanna Furry [phone]) on 07/22/2017 9:21:20 AM       Radiology Dg Chest 2 View  Result Date: 07/22/2017 CLINICAL DATA:  Chest pain EXAM: CHEST  2 VIEW COMPARISON:  07/14/2013 FINDINGS: Lungs are clear.  No pleural effusion or pneumothorax. The heart is normal in size. Visualized osseous structures are within normal limits. IMPRESSION: Normal chest radiographs. Electronically Signed   By: Julian Hy M.D.   On: 07/22/2017 09:04    Procedures Procedures (including critical care time)  Medications Ordered in ED Medications  aspirin chewable tablet 324 mg (324 mg Oral Given 07/22/17 1041)     Initial Impression / Assessment and Plan / ED Course  I have reviewed the triage vital signs and the nursing notes.  Pertinent labs & imaging results that were available during my care of the patient were reviewed by me and considered in my medical decision making (see chart for details).     Given aspirin here 324. He is on metoprolol. He was taken off of Plavix by Dr. Radford Pax several weeks ago. His enzymes are normal upon arrival in the 3 hours remain normal. I discussed the case with Dr. Joette Catching. These are brief episodes. She felt he would be appropriate for outpatient treatment and has a already made arrangements for outpatient stress test. Patient instructed return if any new or worsening symptoms. Continue current medications including full dose aspirin.  Final Clinical Impressions(s) / ED Diagnoses   Final diagnoses:  Chest pain, unspecified type    New Prescriptions New Prescriptions   No medications on file     Tanna Furry, MD 07/22/17 1339

## 2017-07-22 NOTE — ED Triage Notes (Signed)
Patient complains of shortness of breath and chest tightness described as intermittent since 0300. No relief with GERD medication. Alert and oriented, NAD

## 2017-07-22 NOTE — Discharge Instructions (Signed)
Follow-up for your stress test at your scheduled appointment Return to ER with worsening symptoms

## 2017-07-22 NOTE — Telephone Encounter (Signed)
Patient informed and instructions read to patient over the phone and placed in mychart per patient request.

## 2017-07-22 NOTE — ED Notes (Signed)
Pt ambulate to the restroom.

## 2017-07-22 NOTE — Addendum Note (Signed)
Addended by: Merlene Laughter on: 07/22/2017 11:38 AM   Modules accepted: Orders

## 2017-07-22 NOTE — Telephone Encounter (Signed)
Patient informed that stress test changed to [facility] and that he has the same instructions except he can take his toprl xl and did not need to hold it for 24 hours. Patient also advised that a rx for valium 5 mg was called into [facility]. Patient aware that he will be contacted about the [place] appt.

## 2017-07-22 NOTE — Telephone Encounter (Signed)
Patient seen in ER with complaint of mild brief CP with normal trop x 3.  Please set up for stress myoview to rule out ischemia

## 2017-07-23 ENCOUNTER — Telehealth (HOSPITAL_COMMUNITY): Payer: Self-pay | Admitting: Radiology

## 2017-07-23 ENCOUNTER — Telehealth (HOSPITAL_COMMUNITY): Payer: Self-pay | Admitting: *Deleted

## 2017-07-23 NOTE — Telephone Encounter (Signed)
Left message on voicemail per DPR in reference to upcoming appointment scheduled on 07/25/17 at 11:00 with detailed instructions given per Myocardial Perfusion Study Information Sheet for the test. LM to arrive 15 minutes early, and that it is imperative to arrive on time for appointment to keep from having the test rescheduled. If you need to cancel or reschedule your appointment, please call the office within 24 hours of your appointment. Failure to do so may result in a cancellation of your appointment, and a $50 no show fee. Phone number given for call back for any questions. Veronia Beets

## 2017-07-23 NOTE — Telephone Encounter (Signed)
Patient given instructions for Lexiscan . He will arrive @10 :45 07/25/17.

## 2017-07-25 ENCOUNTER — Ambulatory Visit (HOSPITAL_COMMUNITY): Payer: Medicare Other | Attending: Cardiology

## 2017-07-25 DIAGNOSIS — I2510 Atherosclerotic heart disease of native coronary artery without angina pectoris: Secondary | ICD-10-CM | POA: Diagnosis not present

## 2017-07-25 DIAGNOSIS — R0602 Shortness of breath: Secondary | ICD-10-CM | POA: Diagnosis not present

## 2017-07-25 DIAGNOSIS — E119 Type 2 diabetes mellitus without complications: Secondary | ICD-10-CM | POA: Insufficient documentation

## 2017-07-25 DIAGNOSIS — R9439 Abnormal result of other cardiovascular function study: Secondary | ICD-10-CM | POA: Diagnosis not present

## 2017-07-25 DIAGNOSIS — I10 Essential (primary) hypertension: Secondary | ICD-10-CM | POA: Diagnosis not present

## 2017-07-25 DIAGNOSIS — R079 Chest pain, unspecified: Secondary | ICD-10-CM | POA: Diagnosis not present

## 2017-07-25 LAB — MYOCARDIAL PERFUSION IMAGING
CHL CUP NUCLEAR SDS: 2
CHL CUP NUCLEAR SRS: 10
CHL CUP RESTING HR STRESS: 57 {beats}/min
CSEPPHR: 90 {beats}/min
LHR: 0.3
LV dias vol: 157 mL (ref 62–150)
LVSYSVOL: 73 mL
NUC STRESS TID: 1.19
SSS: 7

## 2017-07-25 MED ORDER — TECHNETIUM TC 99M TETROFOSMIN IV KIT
32.4000 | PACK | Freq: Once | INTRAVENOUS | Status: AC | PRN
  Administered 2017-07-25: 32.4 via INTRAVENOUS
  Filled 2017-07-25: qty 33

## 2017-07-25 MED ORDER — REGADENOSON 0.4 MG/5ML IV SOLN
0.4000 mg | Freq: Once | INTRAVENOUS | Status: AC
  Administered 2017-07-25: 0.4 mg via INTRAVENOUS

## 2017-07-25 MED ORDER — TECHNETIUM TC 99M TETROFOSMIN IV KIT
10.3000 | PACK | Freq: Once | INTRAVENOUS | Status: AC | PRN
  Administered 2017-07-25: 10.3 via INTRAVENOUS
  Filled 2017-07-25: qty 11

## 2017-08-02 ENCOUNTER — Other Ambulatory Visit: Payer: Self-pay | Admitting: Internal Medicine

## 2017-08-04 ENCOUNTER — Other Ambulatory Visit: Payer: Self-pay

## 2017-08-12 ENCOUNTER — Encounter: Payer: Self-pay | Admitting: Internal Medicine

## 2017-08-12 ENCOUNTER — Ambulatory Visit (INDEPENDENT_AMBULATORY_CARE_PROVIDER_SITE_OTHER): Payer: Medicare Other | Admitting: Internal Medicine

## 2017-08-12 VITALS — BP 132/70 | HR 71 | Temp 98.3°F | Resp 14 | Ht 71.0 in | Wt 263.4 lb

## 2017-08-12 DIAGNOSIS — I10 Essential (primary) hypertension: Secondary | ICD-10-CM

## 2017-08-12 DIAGNOSIS — K5900 Constipation, unspecified: Secondary | ICD-10-CM | POA: Diagnosis not present

## 2017-08-12 DIAGNOSIS — E118 Type 2 diabetes mellitus with unspecified complications: Secondary | ICD-10-CM

## 2017-08-12 DIAGNOSIS — D649 Anemia, unspecified: Secondary | ICD-10-CM

## 2017-08-12 DIAGNOSIS — I2510 Atherosclerotic heart disease of native coronary artery without angina pectoris: Secondary | ICD-10-CM

## 2017-08-12 LAB — HEMOGLOBIN A1C: HEMOGLOBIN A1C: 6.9 % — AB (ref 4.6–6.5)

## 2017-08-12 MED ORDER — CARVEDILOL 6.25 MG PO TABS: 6.2500 mg | ORAL_TABLET | Freq: Two times a day (BID) | ORAL | 3 refills | Status: DC

## 2017-08-12 NOTE — Assessment & Plan Note (Signed)
DM: Continue glipizide, Tradjenta, Actos plus met.  Last A1c 7.0, recheck today. HTN: Last BMP satisfactory, continue with lisinopril HCT.  Will switch metoprolol to carvedilol.  See below. Anemia: Chart reviewed, saw GI few months ago, no further eval needed.  Continue iron supplements OTC daily Constipation: New issue, started after he was prescribed metoprolol, change to carvedilol.  MiraLAX daily until better.  Call if not better. CAD: Had SOB, recent stress test (-), was prescribed Toprol according to the patient due to PVCs, change to carvedilol d/t constipation.  Plavix was DC'd by cardiology  due to bruising.   Had a flu shot RTC 4 months 

## 2017-08-12 NOTE — Patient Instructions (Signed)
GO TO THE LAB : Get the blood work     GO TO THE FRONT DESK Schedule your next appointment for a  Routine check up in 4 months  ====  Stop Toprol (metoprolol) Take carvedilol 1 tablet twice a day instead  MiraLAX 17 g daily for 2 weeks, then as needed  Okay to take senna OTC for constipation.  Call if not improving   Check the  blood pressure 2 or 3 times a week Be sure your blood pressure is between 110/65 and  145/85. If it is consistently higher or lower, let me know

## 2017-08-12 NOTE — Progress Notes (Signed)
Pre visit review using our clinic review tool, if applicable. No additional management support is needed unless otherwise documented below in the visit note. 

## 2017-08-12 NOTE — Progress Notes (Signed)
Subjective:    Patient ID: Daniel Cruz, male    DOB: 1951/03/16, 66 y.o.   MRN: [id number]  DOS:  08/12/2017 Type of visit - description : rov Interval history: CAD: Went to the ER a few weeks ago with difficulty breathing (ER notes, card notes reviewed): chest was "heavy due to mucus", denies chest pain per se.  Had a stress test, low risk.  After he took Robitussin symptoms essentially resolved. Complain of constipation, used to go to the bathroom every other day and now he will not go unless he takes a laxative.  sxs started 6 weeks ago and coincide with initiation of metoprolol.  After he takes a  laxative and he finally has a BM the stools are extremely hard.  Not taking calcium supplements Mild anemia: Chart reviewed DM: Last A1c slightly elevated, due to recheck, good compliance with medications.    Review of Systems Denies chest pain, lower extremity edema or palpitations No nausea, vomiting, blood in the stool or abdominal pain  Past Medical History:  Diagnosis Date  . Anemia, iron deficiency    hx  . CAD (coronary artery disease)     NSTEMI with PCI of the mid and distal RCA with 50-60% LAD 09/2000, s/p cath 01/2009 with patent RCA stent and 90% LAD s/p PCI of LAD and right PDA  . Cholelithiasis   . DIABETES MELLITUS, TYPE II 02/26/2007  . Fatty liver 02/2009   per u/s  . GERD (gastroesophageal reflux disease)   . HTN (hypertension)   . OSA (obstructive sleep apnea)    can't tol. a CPAP  . Plantar fasciitis of left foot   . PVC (premature ventricular contraction) 06/06/2017  . UTI (lower urinary tract infection)    in the 80s and 11/09    Past Surgical History:  Procedure Laterality Date  . CARDIAC CATHETERIZATION  10/2008   stents  . SEPTOPLASTY     w/ bilateral inferior turbinate reductions  . TONSILLECTOMY      Social History   Socioeconomic History  . Marital status: Married    Spouse name: Not on file  . Number of children: 2  . Years of  education: Not on file  . Highest education level: Not on file  Social Needs  . Financial resource strain: Not on file  . Food insecurity - worry: Not on file  . Food insecurity - inability: Not on file  . Transportation needs - medical: Not on file  . Transportation needs - non-medical: Not on file  Occupational History  . 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 Smoking status: Former Smoker    Packs/day: 1.50    Years: 16.00    Pack years: 24.00    Last attempt to quit: 07/08/2000    Years since quitting: 17.1  . Smokeless tobacco: Never Used  . Tobacco comment: 2001  Substance and Sexual Activity  . Alcohol use: Yes    Alcohol/week: 0.0 oz    Comment: socially  . Drug use: No  . Sexual activity: Yes  Other Topics Concern  . Not on file  Social History Narrative   Lives in [place], [place]   Lives w/ wife, 2 children, 2 g-kids   Retired April 2017             Allergies as of 08/12/2017      Reactions   Januvia [sitagliptin] Nausea Only, Other (See Comments)  Back Pain      Medication List        Accurate as of 08/12/17  6:24 PM. Always use your most recent med list.          aspirin EC 81 MG tablet Take 81 mg by mouth daily.   carvedilol 6.25 MG tablet Commonly known as:  COREG Take 1 tablet (6.25 mg total) 2 (two) times daily with a meal by mouth.   ezetimibe-simvastatin 10-10 MG tablet Commonly known as:  VYTORIN Take 1 tablet by mouth daily.   glipiZIDE 5 MG 24 hr tablet Commonly known as:  GLUCOTROL XL Take 1 tablet (5 mg total) by mouth 2 (two) times daily.   ibuprofen 200 MG tablet Commonly known as:  ADVIL,MOTRIN Take 200 mg by mouth every 6 (six) hours as needed (For pain.).   linagliptin 5 MG Tabs tablet Commonly known as:  TRADJENTA Take 1 tablet (5 mg total) by mouth daily.   lisinopril-hydrochlorothiazide 20-12.5 MG tablet Commonly known as:  PRINZIDE,ZESTORETIC Take 1  tablet by mouth daily.   multivitamin tablet Take 1 tablet by mouth daily.   nitroGLYCERIN 0.4 MG SL tablet Commonly known as:  NITROSTAT Place 1 tablet (0.4 mg total) under the tongue every 5 (five) minutes x 3 doses as needed for chest pain.   pantoprazole 40 MG tablet Commonly known as:  PROTONIX Take 40 mg by mouth 2 (two) times daily.   pioglitazone-metformin 15-850 MG tablet Commonly known as:  ACTOPLUS MET Take 1 tablet by mouth 2 (two) times daily with a meal.          Objective:   Physical Exam BP 132/70 (BP Location: Left Arm, Patient Position: Sitting, Cuff Size: Small)   Pulse 71   Temp 98.3 F (36.8 C) (Oral)   Resp 14   Ht _0  (1.803 m)   Wt 263 lb 6 oz (119.5 kg)   SpO2 97%   BMI 36.73 kg/m  General:   Well developed, well nourished . NAD.  HEENT:  Normocephalic . Face symmetric, atraumatic Lungs:  CTA B Normal respiratory effort, no intercostal retractions, no accessory muscle use. Heart: RRR,  no murmur.  No pretibial edema bilaterally  Skin: Not pale. Not jaundice Neurologic:  alert & oriented X3.  Speech normal, gait appropriate for age and unassisted Psych--  Cognition and judgment appear intact.  Cooperative with normal attention span and concentration.  Behavior appropriate. No anxious or depressed appearing.      Assessment & Plan:    Assessment DM DX 2008 HTN Hyperlipidemia CAD:  -MI 2001, syncope 2010 -07-2017: SOB, low risk stress test OSA, CPAP intolerant ED Mild chronic anemia, low iron stores. Saw GI 04-2017: no further w/u  GERD Gallbladder polyps, 2014, surgery 07-2016 Fatty liver 2010 Used to see  Dr Allyson Sabal  PLAN DM: Continue glipizide, Tradjenta, Actos plus met.  Last A1c 7.0, recheck today. HTN: Last BMP satisfactory, continue with lisinopril HCT.  Will switch metoprolol to carvedilol.  See below. Anemia: Chart reviewed, saw GI few months ago, no further eval needed.  Continue iron supplements OTC  daily Constipation: New issue, started after he was prescribed metoprolol, change to carvedilol.  MiraLAX daily until better.  Call if not better. CAD: Had SOB, recent stress test (-), was prescribed Toprol according to the patient due to PVCs, change to carvedilol d/t constipation.  Plavix was DC'd by cardiology  due to bruising.   Had a flu shot RTC 4 months

## 2017-08-13 MED ORDER — EZETIMIBE-SIMVASTATIN 10-10 MG PO TABS: 1.0000 | ORAL_TABLET | Freq: Every day | ORAL | 1 refills | Status: DC

## 2017-08-13 MED ORDER — CANAGLIFLOZIN 100 MG PO TABS: 100.0000 mg | ORAL_TABLET | Freq: Every day | ORAL | 0 refills | Status: DC

## 2017-08-13 MED ORDER — PIOGLITAZONE HCL-METFORMIN HCL 15-850 MG PO TABS: 1.0000 | ORAL_TABLET | Freq: Two times a day (BID) | ORAL | 1 refills | Status: DC

## 2017-08-13 MED ORDER — LISINOPRIL-HYDROCHLOROTHIAZIDE 20-12.5 MG PO TABS: 1.0000 | ORAL_TABLET | Freq: Every day | ORAL | 1 refills | Status: DC

## 2017-08-13 MED ORDER — LINAGLIPTIN 5 MG PO TABS: 5.0000 mg | ORAL_TABLET | Freq: Every day | ORAL | 1 refills | Status: DC

## 2017-08-13 MED ORDER — GLIPIZIDE ER 5 MG PO TB24: 5.0000 mg | ORAL_TABLET | Freq: Every day | ORAL | 1 refills | Status: DC

## 2017-08-13 NOTE — Addendum Note (Signed)
Addended byDamita Dunnings D on: 08/13/2017 01:16 PM   Modules accepted: Orders

## 2017-08-13 NOTE — Addendum Note (Signed)
Addended byDamita Dunnings D on: 08/13/2017 01:15 PM   Modules accepted: Orders

## 2017-08-20 ENCOUNTER — Other Ambulatory Visit: Payer: Self-pay | Admitting: Internal Medicine

## 2017-09-08 ENCOUNTER — Other Ambulatory Visit: Payer: Self-pay

## 2017-09-08 ENCOUNTER — Telehealth: Payer: Self-pay | Admitting: Internal Medicine

## 2017-09-08 MED ORDER — CANAGLIFLOZIN 100 MG PO TABS: 100.0000 mg | ORAL_TABLET | Freq: Every day | ORAL | 2 refills | Status: DC

## 2017-09-08 NOTE — Telephone Encounter (Signed)
Copied from [place]. Topic: Quick Communication - See Telephone Encounter >> Sep 08, 2017  9:23 AM Corie Chiquito, NT wrote: CRM for notification. See Telephone encounter for: Patient needs a refill on his Invokana and [facility] [address] [place]. If someone could please give him a call back about this   09/08/17.

## 2017-09-10 ENCOUNTER — Encounter: Payer: Self-pay | Admitting: Family Medicine

## 2017-09-10 ENCOUNTER — Other Ambulatory Visit (HOSPITAL_COMMUNITY)
Admission: RE | Admit: 2017-09-10 | Discharge: 2017-09-10 | Disposition: A | Discharge status: Home / Self Care | Payer: Medicare Other | Source: Ambulatory Visit | Attending: Family Medicine | Admitting: Family Medicine

## 2017-09-10 ENCOUNTER — Ambulatory Visit (INDEPENDENT_AMBULATORY_CARE_PROVIDER_SITE_OTHER): Payer: Medicare Other | Admitting: Family Medicine

## 2017-09-10 VITALS — BP 132/78 | HR 87 | Temp 98.8°F | Ht 71.0 in | Wt 258.4 lb

## 2017-09-10 DIAGNOSIS — R300 Dysuria: Secondary | ICD-10-CM | POA: Diagnosis not present

## 2017-09-10 LAB — POC URINALSYSI DIPSTICK (AUTOMATED)
Bilirubin, UA: NEGATIVE
Leukocytes, UA: NEGATIVE
Nitrite, UA: NEGATIVE
PH UA: 6 (ref 5.0–8.0)
PROTEIN UA: NEGATIVE
RBC UA: NEGATIVE
SPEC GRAV UA: 1.025 (ref 1.010–1.025)
Urobilinogen, UA: 0.2 E.U./dL

## 2017-09-10 MED ORDER — SULFAMETHOXAZOLE-TRIMETHOPRIM 800-160 MG PO TABS: 1.0000 | ORAL_TABLET | Freq: Two times a day (BID) | ORAL | 0 refills | Status: DC

## 2017-09-10 NOTE — Progress Notes (Signed)
Pre visit review using our clinic review tool, if applicable. No additional management support is needed unless otherwise documented below in the visit note. 

## 2017-09-10 NOTE — Progress Notes (Signed)
Chief Complaint  Patient presents with  . Dysuria  . Headache    Daniel Daniel is a 66 y.o. male here for dysuria.  Duration: 2 days. Symptoms: urinary frequency and dysuria Denies: hematuria, urinary retention, fever, nausea, vomiting, flank pain, insertive anal intercourse, penile discharge Hx of recurrent UTI? No Denies new sexual partners.  ROS:  Constitutional: denies fever GU: As noted in HPI MSK: Denies back pain Abd: Denies constipation or abdominal pain  Past Medical History:  Diagnosis Date  . Anemia, iron deficiency    hx  . CAD (coronary artery disease)     NSTEMI with PCI of the mid and distal RCA with 50-60% LAD 09/2000, s/p cath 01/2009 with patent RCA stent and 90% LAD s/p PCI of LAD and right PDA  . Cholelithiasis   . DIABETES MELLITUS, TYPE II 02/26/2007  . Fatty liver 02/2009   per u/s  . GERD (gastroesophageal reflux disease)   . HTN (hypertension)   . OSA (obstructive sleep apnea)    can't tol. a CPAP  . Plantar fasciitis of left foot   . PVC (premature ventricular contraction) 06/06/2017  . UTI (lower urinary tract infection)    in the 80s and 11/09      BP 132/78 (BP Location: Left Arm, Patient Position: Sitting, Cuff Size: Large)   Pulse 87   Temp 98.8 F (37.1 C) (Oral)   Ht 5\' 11"  (1.803 m)   Wt 258 lb 6 oz (117.2 kg)   SpO2 96%   BMI 36.04 kg/m  General: Awake, alert, appears stated age HEENT: MMM Heart: RRR, no murmurs Lungs: CTAB, normal respiratory effort, no accessory muscle usage Abd: BS+, soft, NT, ND, no masses or organomegaly MSK: No CVA tenderness, neg Lloyd's sign Rectal: Sphincter of good tone, prostate enlarged, no bogginess or TTP Psych: Age appropriate judgment and insight  Dysuria - Plan: POCT Urinalysis Dipstick (Automated), Urine cytology ancillary only, Urine Culture  Orders as above. Empirically tx for UTI. Culture. If no improvement and cx unremarkable, will refer to urology. F/u prn. The patient voiced  understanding and agreement to the plan.  [place], DO 09/10/17 12:51 PM

## 2017-09-10 NOTE — Patient Instructions (Addendum)
We are culturing your urine and will let you know if you need to change the plan.  Continue to push fluids.  Things to look out for include fevers, worsening symptoms, or nausea/vomiting.

## 2017-09-11 ENCOUNTER — Encounter: Payer: Self-pay | Admitting: Family Medicine

## 2017-09-11 LAB — URINE CYTOLOGY ANCILLARY ONLY
Chlamydia: NEGATIVE
Neisseria Gonorrhea: NEGATIVE
Trichomonas: NEGATIVE

## 2017-09-11 LAB — URINE CULTURE
MICRO NUMBER:: 81367743
Result:: NO GROWTH
SPECIMEN QUALITY: ADEQUATE

## 2017-09-12 ENCOUNTER — Other Ambulatory Visit: Payer: Self-pay | Admitting: Family Medicine

## 2017-09-12 DIAGNOSIS — R300 Dysuria: Secondary | ICD-10-CM

## 2017-09-12 MED ORDER — PHENAZOPYRIDINE HCL 100 MG PO TABS: 100.0000 mg | ORAL_TABLET | Freq: Three times a day (TID) | ORAL | 0 refills | Status: DC | PRN

## 2017-09-12 NOTE — Progress Notes (Signed)
[facility] called in. Pt notified via [place].

## 2017-09-16 ENCOUNTER — Encounter: Payer: Self-pay | Admitting: Family Medicine

## 2017-10-15 ENCOUNTER — Other Ambulatory Visit (INDEPENDENT_AMBULATORY_CARE_PROVIDER_SITE_OTHER): Payer: Medicare Other

## 2017-10-15 DIAGNOSIS — E118 Type 2 diabetes mellitus with unspecified complications: Secondary | ICD-10-CM

## 2017-10-15 LAB — BASIC METABOLIC PANEL
BUN: 19 mg/dL (ref 6–23)
CO2: 29 mEq/L (ref 19–32)
CREATININE: 0.9 mg/dL (ref 0.40–1.50)
Calcium: 9.2 mg/dL (ref 8.4–10.5)
Chloride: 104 mEq/L (ref 96–112)
GFR: 88.96 mL/min (ref >60.00)
Glucose, Bld: 110 mg/dL — ABNORMAL HIGH (ref 70–99)
POTASSIUM: 4.8 meq/L (ref 3.5–5.1)
Sodium: 141 mEq/L (ref 135–145)

## 2017-10-15 LAB — HEMOGLOBIN A1C: HEMOGLOBIN A1C: 7 % — AB (ref 4.6–6.5)

## 2017-10-20 MED ORDER — CANAGLIFLOZIN 300 MG PO TABS: 300.0000 mg | ORAL_TABLET | Freq: Every day | ORAL | 0 refills | Status: DC

## 2017-10-20 NOTE — Addendum Note (Signed)
Addended byDamita Dunnings D on: 10/20/2017 09:04 AM   Modules accepted: Orders

## 2017-10-23 ENCOUNTER — Encounter: Payer: Self-pay | Admitting: Internal Medicine

## 2017-10-31 ENCOUNTER — Other Ambulatory Visit: Payer: Self-pay | Admitting: Internal Medicine

## 2017-11-03 ENCOUNTER — Telehealth: Payer: Self-pay | Admitting: Internal Medicine

## 2017-11-03 MED ORDER — GLIPIZIDE ER 5 MG PO TB24: 5.0000 mg | ORAL_TABLET | Freq: Every day | ORAL | 1 refills | Status: DC

## 2017-11-03 MED ORDER — LINAGLIPTIN 5 MG PO TABS: 5.0000 mg | ORAL_TABLET | Freq: Every day | ORAL | 1 refills | Status: DC

## 2017-11-03 MED ORDER — EZETIMIBE-SIMVASTATIN 10-10 MG PO TABS: 1.0000 | ORAL_TABLET | Freq: Every day | ORAL | 1 refills | Status: DC

## 2017-11-03 MED ORDER — LISINOPRIL-HYDROCHLOROTHIAZIDE 20-12.5 MG PO TABS: 1.0000 | ORAL_TABLET | Freq: Every day | ORAL | 1 refills | Status: DC

## 2017-11-03 NOTE — Telephone Encounter (Signed)
Rxs sent

## 2017-11-03 NOTE — Telephone Encounter (Signed)
Copied from [employer]. Topic: Quick Communication - See Telephone Encounter >> Nov 03, 2017 12:40 PM Conception Chancy, NT wrote: CRM for notification. See Telephone encounter for:  11/03/17.  [facility] and requesting refills on Tradjenta 5mg , Vytorin 10-10mg , Glipizide 5mg , and Lisinipril HTZT 20-12.5mg     CB# [phone]  Reference # [id number]

## 2017-11-28 ENCOUNTER — Encounter: Payer: Self-pay | Admitting: Internal Medicine

## 2017-11-28 MED ORDER — DAPAGLIFLOZIN PROPANEDIOL 10 MG PO TABS: 10.0000 mg | ORAL_TABLET | Freq: Every day | ORAL | 1 refills | Status: DC

## 2017-11-28 MED ORDER — PIOGLITAZONE HCL-METFORMIN HCL 15-850 MG PO TABS: 1.0000 | ORAL_TABLET | Freq: Two times a day (BID) | ORAL | 1 refills | Status: DC

## 2017-12-01 ENCOUNTER — Other Ambulatory Visit: Payer: Self-pay

## 2017-12-01 MED ORDER — CARVEDILOL 6.25 MG PO TABS: 6.2500 mg | ORAL_TABLET | Freq: Two times a day (BID) | ORAL | 1 refills | Status: DC

## 2017-12-18 ENCOUNTER — Encounter: Payer: Self-pay | Admitting: Internal Medicine

## 2017-12-18 ENCOUNTER — Ambulatory Visit (INDEPENDENT_AMBULATORY_CARE_PROVIDER_SITE_OTHER): Payer: Medicare Other | Admitting: Internal Medicine

## 2017-12-18 VITALS — BP 132/68 | HR 56 | Temp 97.6°F | Resp 14 | Ht 71.0 in | Wt 259.0 lb

## 2017-12-18 DIAGNOSIS — E118 Type 2 diabetes mellitus with unspecified complications: Secondary | ICD-10-CM

## 2017-12-18 DIAGNOSIS — I10 Essential (primary) hypertension: Secondary | ICD-10-CM

## 2017-12-18 DIAGNOSIS — R1013 Epigastric pain: Secondary | ICD-10-CM

## 2017-12-18 DIAGNOSIS — I2510 Atherosclerotic heart disease of native coronary artery without angina pectoris: Secondary | ICD-10-CM

## 2017-12-18 LAB — CBC WITH DIFFERENTIAL/PLATELET
Basophils Absolute: 0 10*3/uL (ref 0.0–0.1)
Basophils Relative: 0.5 % (ref 0.0–3.0)
EOS ABS: 0.2 10*3/uL (ref 0.0–0.7)
EOS PCT: 2.5 % (ref 0.0–5.0)
HCT: 38 % — ABNORMAL LOW (ref 39.0–52.0)
HEMOGLOBIN: 12.6 g/dL — AB (ref 13.0–17.0)
LYMPHS ABS: 1.2 10*3/uL (ref 0.7–4.0)
Lymphocytes Relative: 19.3 % (ref 12.0–46.0)
MCHC: 33.1 g/dL (ref 30.0–36.0)
MCV: 93.5 fl (ref 78.0–100.0)
MONO ABS: 0.7 10*3/uL (ref 0.1–1.0)
Monocytes Relative: 10.8 % (ref 3.0–12.0)
NEUTROS PCT: 66.9 % (ref 43.0–77.0)
Neutro Abs: 4.1 10*3/uL (ref 1.4–7.7)
Platelets: 202 10*3/uL (ref 150.0–400.0)
RBC: 4.06 Mil/uL — AB (ref 4.22–5.81)
RDW: 16.5 % — ABNORMAL HIGH (ref 11.5–15.5)
WBC: 6.2 10*3/uL (ref 4.0–10.5)

## 2017-12-18 LAB — HEPATIC FUNCTION PANEL
ALBUMIN: 4 g/dL (ref 3.5–5.2)
ALT: 17 U/L (ref 0–53)
AST: 19 U/L (ref 0–37)
Alkaline Phosphatase: 47 U/L (ref 39–117)
Bilirubin, Direct: 0.1 mg/dL (ref 0.0–0.3)
TOTAL PROTEIN: 6.7 g/dL (ref 6.0–8.3)
Total Bilirubin: 0.4 mg/dL (ref 0.2–1.2)

## 2017-12-18 LAB — HEMOGLOBIN A1C: HEMOGLOBIN A1C: 7 % — AB (ref 4.6–6.5)

## 2017-12-18 MED ORDER — RANITIDINE HCL 300 MG PO TABS: 300.0000 mg | ORAL_TABLET | Freq: Every day | ORAL | 3 refills | Status: DC

## 2017-12-18 NOTE — Progress Notes (Signed)
Subjective:    Patient ID: Daniel Cruz, male    DOB: 09-29-1951, 67 y.o.   MRN: [id number]  DOS:  12/18/2017 Type of visit - description : rov Interval history: DM: Good compliance with medications, no ambulatory CBGs HTN: Good med compliance.  No ambulatory BPs CAD: Asymptomatic History of constipation, see last visit, symptoms have definitely decreased. Today, he also complained of upper GI symptoms: Early satiety, feeling bloated, symptoms worse with heavy meals, "sour stomach".  Feels like a "lump" in the upper abdomen.  Wt Readings from Last 3 Encounters:  12/18/17 259 lb (117.5 kg)  09/10/17 258 lb 6 oz (117.2 kg)  08/12/17 263 lb 6 oz (119.5 kg)     Review of Systems Denies chest pain, shortness of breath No nausea, diarrhea, no blood in the stools.  No change in the color of the stools. No classic heartburn, no dysphagia or odynophagia  Past Medical History:  Diagnosis Date  . Anemia, iron deficiency    hx  . CAD (coronary artery disease)     NSTEMI with PCI of the mid and distal RCA with 50-60% LAD 09/2000, s/p cath 01/2009 with patent RCA stent and 90% LAD s/p PCI of LAD and right PDA  . Cholelithiasis   . DIABETES MELLITUS, TYPE II 02/26/2007  . Fatty liver 02/2009   per u/s  . GERD (gastroesophageal reflux disease)   . HTN (hypertension)   . OSA (obstructive sleep apnea)    can't tol. a CPAP  . Plantar fasciitis of left foot   . PVC (premature ventricular contraction) 06/06/2017  . UTI (lower urinary tract infection)    in the 80s and 11/09    Past Surgical History:  Procedure Laterality Date  . CARDIAC CATHETERIZATION  10/2008   stents  . CHOLECYSTECTOMY N/A 07/16/2016   Procedure: LAPAROSCOPIC CHOLECYSTECTOMY WITH INTRAOPERATIVE CHOLANGIOGRAM;  Surgeon: [name], MD;  Location: [place];  Service: General;  Laterality: N/A;  . SEPTOPLASTY     w/ bilateral inferior turbinate reductions  . TONSILLECTOMY      Social History   Socioeconomic  History  . Marital status: Married    Spouse name: Not on file  . Number of children: 2  . Years of education: Not on file  . Highest education level: Not on file  Social Needs  . Financial resource strain: Not on file  . Food insecurity - worry: Not on file  . Food insecurity - inability: Not on file  . Transportation needs - medical: Not on file  . Transportation needs - non-medical: Not on file  Occupational History  . 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 Smoking status: Former Smoker    Packs/day: 1.50    Years: 16.00    Pack years: 24.00    Last attempt to quit: 07/08/2000    Years since quitting: 17.4  . Smokeless tobacco: Never Used  . Tobacco comment: 2001  Substance and Sexual Activity  . Alcohol use: Yes    Alcohol/week: 0.0 oz    Comment: socially  . Drug use: No  . Sexual activity: Yes  Other Topics Concern  . Not on file  Social History Narrative   Lives in [place], [place]   Lives w/ wife, 2 children, 2 g-kids   Retired April 2017             Allergies as of 12/18/2017      Reactions  Januvia [sitagliptin] Nausea Only, Other (See Comments)   Back Pain      Medication List        Accurate as of 12/18/17  9:17 PM. Always use your most recent med list.          aspirin EC 81 MG tablet Take 81 mg by mouth daily.   carvedilol 6.25 MG tablet Commonly known as:  COREG Take 1 tablet (6.25 mg total) by mouth 2 (two) times daily with a meal.   dapagliflozin propanediol 10 MG Tabs tablet Commonly known as:  FARXIGA Take 10 mg by mouth daily.   ezetimibe-simvastatin 10-10 MG tablet Commonly known as:  VYTORIN Take 1 tablet by mouth daily.   glipiZIDE 5 MG 24 hr tablet Commonly known as:  GLUCOTROL XL Take 1 tablet (5 mg total) by mouth daily with breakfast.   ibuprofen 200 MG tablet Commonly known as:  ADVIL,MOTRIN Take 200 mg by mouth every 6 (six) hours as needed (For  pain.).   linagliptin 5 MG Tabs tablet Commonly known as:  TRADJENTA Take 1 tablet (5 mg total) by mouth daily.   lisinopril-hydrochlorothiazide 20-12.5 MG tablet Commonly known as:  PRINZIDE,ZESTORETIC Take 1 tablet by mouth daily.   multivitamin tablet Take 1 tablet by mouth daily.   nitroGLYCERIN 0.4 MG SL tablet Commonly known as:  NITROSTAT Place 1 tablet (0.4 mg total) under the tongue every 5 (five) minutes x 3 doses as needed for chest pain.   pantoprazole 40 MG tablet Commonly known as:  PROTONIX Take 40 mg by mouth 2 (two) times daily.   pioglitazone-metformin 15-850 MG tablet Commonly known as:  ACTOPLUS MET Take 1 tablet by mouth 2 (two) times daily with a meal.   ranitidine 300 MG tablet Commonly known as:  ZANTAC Take 1 tablet (300 mg total) by mouth at bedtime.          Objective:   Physical Exam BP 132/68 (BP Location: Left Arm, Patient Position: Sitting, Cuff Size: Normal)   Pulse (!) 56   Temp 97.6 F (36.4 C) (Oral)   Resp 14   Ht '5\' 11"'$  (1.803 m)   Wt 259 lb (117.5 kg)   SpO2 96%   BMI 36.12 kg/m  General:   Well developed, well nourished . NAD.  HEENT:  Normocephalic . Face symmetric, atraumatic Lungs:  CTA B Normal respiratory effort, no intercostal retractions, no accessory muscle use. Heart: RRR,  no murmur.  no pretibial edema bilaterally  Abdomen:  Not distended, soft, non-tender. No rebound or rigidity.   Skin: Not pale. Not jaundice Neurologic:  alert & oriented X3.  Speech normal, gait appropriate for age and unassisted Psych--  Cognition and judgment appear intact.  Cooperative with normal attention span and concentration.  Behavior appropriate. No anxious or depressed appearing.     Assessment & Plan:    Assessment DM DX 2008 HTN Hyperlipidemia CAD:  -MI 2001, syncope 2010 -07-2017: SOB, low risk stress test OSA, CPAP intolerant ED Mild chronic anemia, low iron stores. Saw GI 04-2017: no further w/u   GERD Gallbladder polyps, 2014, surgery 07-2016 Fatty liver 2010 Used to see  Dr Cena Benton DM: Currently on glipizide, Tradjenta, Actos plus met, and Invokana (will switch to [place] soon).  Slightly more physically active, has lost a couple of pounds.  No ambulatory CBGs.  Check a A1c and LFTs. Reluctant to check CBGs HTN: Seems well controlled on carvedilol and Zestoretic.  Last BMP satisfactory CAD: Asx Dyspepsia: Upper  GI sxs as described above, DDX is large and includes gastroparesis, PUD, GERD.  Clinically most likely gastroparesis. s/p cholecystectomy, currently on Protonix twice daily, will add Zantac nightly.  Also recommend small feedings.  We are checking a CBC.  Advised patient to call in a couple of months if he is not better.  Further eval?. Constipation: See last visit, improved. RTC 4 months

## 2017-12-18 NOTE — Patient Instructions (Signed)
GO TO THE LAB : Get the blood work     GO TO THE FRONT DESK Schedule your next appointment for a checkup in 4 months  Start taking Zantac at night   Gastroparesis Gastroparesis, also called delayed gastric emptying, is a condition in which food takes longer than normal to empty from the stomach. The condition is usually long-lasting (chronic). What are the causes? This condition may be caused by:  An endocrine disorder, such as hypothyroidism or diabetes. Diabetes is the most common cause of this condition.  A nervous system disease, such as Parkinson disease or multiple sclerosis.  Cancer, infection, or surgery of the stomach or vagus nerve.  A connective tissue disorder, such as scleroderma.  Certain medicines.  In most cases, the cause is not known. What increases the risk? This condition is more likely to develop in:  People with certain disorders, including endocrine disorders, eating disorders, amyloidosis, and scleroderma.  People with certain diseases, including Parkinson disease or multiple sclerosis.  People with cancer or infection of the stomach or vagus nerve.  People who have had surgery on the stomach or vagus nerve.  People who take certain medicines.  Women.  What are the signs or symptoms? Symptoms of this condition include:  An early feeling of fullness when eating.  Nausea.  Weight loss.  Vomiting.  Heartburn.  Abdominal bloating.  Inconsistent blood glucose levels.  Lack of appetite.  Acid from the stomach coming up into the esophagus (gastroesophageal reflux).  Spasms of the stomach.  Symptoms may come and go. How is this diagnosed? This condition is diagnosed with tests, such as:  Tests that check how long it takes food to move through the stomach and intestines. These tests include: ? Upper gastrointestinal (GI) series. In this test, X-rays of the intestines are taken after you drink a liquid. The liquid makes the intestines  show up better on the X-rays. ? Gastric emptying scintigraphy. In this test, scans are taken after you eat food that contains a small amount of radioactive material. ? Wireless capsule GI monitoring system. This test involves swallowing a capsule that records information about movement through the stomach.  Gastric manometry. This test measures electrical and muscular activity in the stomach. It is done with a thin tube that is passed down the throat and into the stomach.  Endoscopy. This test checks for abnormalities in the lining of the stomach. It is done with a long, thin tube that is passed down the throat and into the stomach.  An ultrasound. This test can help rule out gallbladder disease or pancreatitis as a cause of your symptoms. It uses sound waves to take pictures of the inside of your body.  How is this treated? There is no cure for gastroparesis. This condition may be managed with:  Treatment of the underlying condition causing the gastroparesis.  Lifestyle changes, including exercise and dietary changes. Dietary changes can include: ? Changes in what and when you eat. ? Eating smaller meals more often. ? Eating low-fat foods. ? Eating low-fiber forms of high-fiber foods, such as cooked vegetables instead of raw vegetables. ? Having liquid foods in place of solid foods. Liquid foods are easier to digest.  Medicines. These may be given to control nausea and vomiting and to stimulate stomach muscles.  Getting food through a feeding tube. This may be done in severe cases.  A gastric neurostimulator. This is a device that is inserted into the body with surgery. It helps improve stomach  emptying and control nausea and vomiting.  Follow these instructions at home:  Follow your health care provider's instructions about exercise and diet.  Take medicines only as directed by your health care provider. Contact a health care provider if:  Your symptoms do not improve with  treatment.  You have new symptoms. Get help right away if:  You have severe abdominal pain that does not improve with treatment.  You have nausea that does not go away.  You cannot keep fluids down. This information is not intended to replace advice given to you by your health care provider. Make sure you discuss any questions you have with your health care provider. Document Released: 09/23/2005 Document Revised: 02/29/2016 Document Reviewed: 09/19/2014 Elsevier Interactive Patient Education  [employer].

## 2017-12-18 NOTE — Assessment & Plan Note (Signed)
DM: Currently on glipizide, Tradjenta, Actos plus met, and Invokana (will switch to [place] soon).  Slightly more physically active, has lost a couple of pounds.  No ambulatory CBGs.  Check a A1c and LFTs. Reluctant to check CBGs HTN: Seems well controlled on carvedilol and Zestoretic.  Last BMP satisfactory CAD: Asx Dyspepsia: Upper GI sxs as described above, DDX is large and includes gastroparesis, PUD, GERD.  Clinically most likely gastroparesis. s/p cholecystectomy, currently on Protonix twice daily, will add Zantac nightly.  Also recommend small feedings.  We are checking a CBC.  Advised patient to call in a couple of months if he is not better.  Further eval?. Constipation: See last visit, improved. RTC 4 months

## 2017-12-18 NOTE — Progress Notes (Signed)
Pre visit review using our clinic review tool, if applicable. No additional management support is needed unless otherwise documented below in the visit note. 

## 2018-03-05 NOTE — Progress Notes (Addendum)
Subjective:   Daniel Cruz is a 67 y.o. male who presents for Medicare Annual/Subsequent preventive examination.  Review of Systems: No ROS.  Medicare Wellness Visit. Additional risk factors are reflected in the social history. Cardiac Risk Factors include: advanced age (>40mn, >>68women);diabetes mellitus;dyslipidemia;hypertension;male gender Sleep patterns:Sleeps 5-6 hrs per night. Naps as needed.  Home Safety/Smoke Alarms: Feels safe in home. Smoke alarms in place.  Living environment; residence and Firearm Safety: Lives with wife in split level story home.  Eye-Dr. SMarica Otteryearly.  Male:   CCS- Due 2024 PSA-  Lab Results  Component Value Date   PSA 1.51 03/11/2017   PSA 1.03 08/24/2014   PSA 1.15 11/30/2012       Objective:    Vitals: BP 128/70 (BP Location: Left Arm, Patient Position: Sitting, Cuff Size: Normal)   Pulse 74   Ht _0  (1.803 m)   Wt 261 lb 6.4 oz (118.6 kg)   SpO2 98%   BMI 36.46 kg/m   Body mass index is 36.46 kg/m.  Advanced Directives 03/12/2018 07/22/2017 03/11/2017 07/08/2016 07/14/2013  Does Patient Have a Medical Advance Directive? Yes No Yes Yes Patient does not have advance directive  Type of Advance Directive [place] Living will;Healthcare Power of Attorney -  Does patient want to make changes to medical advance directive? - - No - Patient declined No - Patient declined -  Copy of [place] Chart? No - copy requested - No - copy requested No - copy requested -  Would patient like information on creating a medical advance directive? - No - Patient declined - - -  Pre-existing out of facility DNR order (yellow form or pink MOST form) - - - - No    Tobacco Social History   Tobacco Use  Smoking Status Former Smoker  . Packs/day: 1.50  . Years: 16.00  . Pack years: 24.00  . Last attempt to quit: 07/08/2000  . Years since quitting:  17.6  Smokeless Tobacco Never Used  Tobacco Comment   2001     Counseling given: Not Answered Comment: 2001   Clinical Intake:     Pain : No/denies pain                 Past Medical History:  Diagnosis Date  . Anemia, iron deficiency    hx  . CAD (coronary artery disease)     NSTEMI with PCI of the mid and distal RCA with 50-60% LAD 09/2000, s/p cath 01/2009 with patent RCA stent and 90% LAD s/p PCI of LAD and right PDA  . Cholelithiasis   . DIABETES MELLITUS, TYPE II 02/26/2007  . Fatty liver 02/2009   per u/s  . GERD (gastroesophageal reflux disease)   . HTN (hypertension)   . OSA (obstructive sleep apnea)    can't tol. a CPAP  . Plantar fasciitis of left foot   . PVC (premature ventricular contraction) 06/06/2017  . UTI (lower urinary tract infection)    in the 80s and 11/09   Past Surgical History:  Procedure Laterality Date  . CARDIAC CATHETERIZATION  10/2008   stents  . CHOLECYSTECTOMY N/A 07/16/2016   Procedure: LAPAROSCOPIC CHOLECYSTECTOMY WITH INTRAOPERATIVE CHOLANGIOGRAM;  Surgeon: TErroll Luna MD;  [facility]  Service: General;  Laterality: N/A;  . SEPTOPLASTY     w/ bilateral inferior turbinate reductions  . TONSILLECTOMY     Family History  Problem  Relation Age of Onset  . Emphysema Mother        died  . COPD Mother   . Stroke Father   . Coronary artery disease Father   . Cancer Neg Hx        colon ,prostate  . Diabetes Neg Hx    Social History   Socioeconomic History  . Marital status: Married    Spouse name: Not on file  . Number of children: 2  . Years of education: Not on file  . Highest education level: Not on file  Occupational History  . 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 Financial resource strain: Not on file  . Food insecurity:    Worry: Not on file    Inability: Not on file  . Transportation needs:    Medical: Not on file    Non-medical:  Not on file  Tobacco Use  . Smoking status: Former Smoker    Packs/day: 1.50    Years: 16.00    Pack years: 24.00    Last attempt to quit: 07/08/2000    Years since quitting: 17.6  . Smokeless tobacco: Never Used  . Tobacco comment: 2001  Substance and Sexual Activity  . Alcohol use: Yes    Alcohol/week: 0.0 oz    Comment: socially  . Drug use: No  . Sexual activity: Yes  Lifestyle  . Physical activity:    Days per week: Not on file    Minutes per session: Not on file  . Stress: Not on file  Relationships  . Social connections:    Talks on phone: Not on file    Gets together: Not on file    Attends religious service: Not on file    Active member of club or organization: Not on file    Attends meetings of clubs or organizations: Not on file    Relationship status: Not on file  Other Topics Concern  . Not on file  Social History Narrative   Lives in [place], [place]   Lives w/ wife, 2 children, 2 g-kids   Retired April 2017           Outpatient Encounter Medications as of 03/12/2018  Medication Sig  . aspirin EC 81 MG tablet Take 81 mg by mouth daily.  . carvedilol (COREG) 6.25 MG tablet Take 1 tablet (6.25 mg total) by mouth 2 (two) times daily with a meal.  . dapagliflozin propanediol (FARXIGA) 10 MG TABS tablet Take 10 mg by mouth daily.  Marland Kitchen ezetimibe-simvastatin (VYTORIN) 10-10 MG tablet Take 1 tablet by mouth daily.  Marland Kitchen glipiZIDE (GLUCOTROL XL) 5 MG 24 hr tablet Take 1 tablet (5 mg total) by mouth daily with breakfast.  . ibuprofen (ADVIL,MOTRIN) 200 MG tablet Take 200 mg by mouth every 6 (six) hours as needed (For pain.).  Marland Kitchen linagliptin (TRADJENTA) 5 MG TABS tablet Take 1 tablet (5 mg total) by mouth daily.  Marland Kitchen lisinopril-hydrochlorothiazide (PRINZIDE,ZESTORETIC) 20-12.5 MG tablet Take 1 tablet by mouth daily.  . Multiple Vitamin (MULTIVITAMIN) tablet Take 1 tablet by mouth daily.    . pantoprazole (PROTONIX) 40 MG tablet Take 40 mg by mouth 2 (two) times daily.  .  pioglitazone-metformin (ACTOPLUS MET) 15-850 MG tablet Take 1 tablet by mouth 2 (two) times daily with a meal.  . nitroGLYCERIN (NITROSTAT) 0.4 MG SL tablet Place 1 tablet (0.4 mg total) under the tongue every 5 (five) minutes x 3 doses as needed  for chest pain. (Patient not taking: Reported on 03/12/2018)  . ranitidine (ZANTAC) 300 MG tablet Take 0.5 tablets (150 mg total) by mouth at bedtime. (Patient not taking: Reported on 03/12/2018)  . [DISCONTINUED] ranitidine (ZANTAC) 300 MG tablet Take 1 tablet (300 mg total) by mouth at bedtime.   No facility-administered encounter medications on file as of 03/12/2018.     Activities of Daily Living In your present state of health, do you have any difficulty performing the following activities: 03/12/2018  Hearing? N  Vision? N  Difficulty concentrating or making decisions? N  Walking or climbing stairs? N  Dressing or bathing? N  Doing errands, shopping? N  Preparing Food and eating ? N  Using the Toilet? N  In the past six months, have you accidently leaked urine? N  Do you have problems with loss of bowel control? N  Managing your Medications? N  Managing your Finances? N  Some recent data might be hidden    Patient Care Team: Colon Branch, MD as PCP - General (Internal Medicine) Marica Otter, Otis as Consulting Physician (Optometry) Sueanne Margarita, MD as Consulting Physician (Cardiology) Laurence Spates, MD as Consulting Physician (Gastroenterology) [name], MD as Consulting Physician (General Surgery)   Assessment:   This is a routine wellness examination for Daniel Cruz. Physical assessment deferred to PCP.  Exercise Activities and Dietary recommendations Current Exercise Habits: Home exercise routine, Type of exercise: walking, Time (Minutes): 60, Frequency (Times/Week): 5, Weekly Exercise (Minutes/Week): 300, Intensity: Mild Diet (meal preparation, eat out, water intake, caffeinated beverages, dairy products, fruits and vegetables):  well balanced, on average, 3 meals per day     Goals    . Weight (lb) < 245 lb (111.1 kg)     Eat smaller portions and walk 4x/week for at least 31mn.       Fall Risk Fall Risk  03/12/2018 03/11/2017 05/08/2016 12/22/2015 05/08/2015  Falls in the past year? _0     Depression Screen PHQ 2/9 Scores 03/12/2018 03/11/2017 05/08/2016 12/22/2015  PHQ - 2 Score 0 0 0 0    Cognitive Function Ad8 score reviewed for issues:  Issues making decisions:no  Less interest in hobbies / activities:no  Repeats questions, stories (family complaining):no  Trouble using ordinary gadgets (microwave, computer, phone):no  Forgets the month or year: no  Mismanaging finances: no  Remembering appts:no  Daily problems with thinking and/or memory:no Ad8 score is=0         Immunization History  Administered Date(s) Administered  . H1N1 11/08/2008  . Influenza Split 11/01/2011  . Influenza Whole 07/20/2007, 10/20/2009, 08/14/2010  . Influenza, High Dose Seasonal PF 09/15/2015, 11/08/2016  . Influenza,inj,Quad PF,6+ Mos 06/30/2013, 08/24/2014  . Influenza-Unspecified 07/14/2017  . Pneumococcal Conjugate-13 08/24/2014  . Pneumococcal Polysaccharide-23 07/20/2007, 03/11/2017  . Td 10/08/2003, 12/22/2015  . Zoster 11/30/2012   Screening Tests Health Maintenance  Topic Date Due  . FOOT EXAM  05/08/2017  . INFLUENZA VACCINE  05/07/2018  . OPHTHALMOLOGY EXAM  05/08/2018  . HEMOGLOBIN A1C  06/20/2018  . COLONOSCOPY  01/08/2023  . TETANUS/TDAP  12/21/2025  . Hepatitis C Screening  Completed  . PNA vac Low Risk Adult  Completed      Plan:   Follow up with Dr.Paz as scheduled 04/16/18  Please schedule your next medicare wellness visit with me in 1 yr.  Continue to eat heart healthy diet (full of fruits, vegetables, whole grains, lean protein, water--limit salt, fat, and sugar intake) and increase physical  activity as tolerated.    I have personally reviewed and noted the following in  the patient's chart:   . Medical and social history . Use of alcohol, tobacco or illicit drugs  . Current medications and supplements . Functional ability and status . Nutritional status . Physical activity . Advanced directives . List of other physicians . Hospitalizations, surgeries, and ER visits in previous 12 months . Vitals . Screenings to include cognitive, depression, and falls . Referrals and appointments  In addition, I have reviewed and discussed with patient certain preventive protocols, quality metrics, and best practice recommendations. A written personalized care plan for preventive services as well as general preventive health recommendations were provided to patient.     Naaman Plummer [place], [place]  03/12/2018  Kathlene November, MD

## 2018-03-10 ENCOUNTER — Other Ambulatory Visit: Payer: Self-pay | Admitting: Internal Medicine

## 2018-03-12 ENCOUNTER — Encounter: Payer: Self-pay | Admitting: *Deleted

## 2018-03-12 ENCOUNTER — Ambulatory Visit (INDEPENDENT_AMBULATORY_CARE_PROVIDER_SITE_OTHER): Payer: Medicare Other | Admitting: *Deleted

## 2018-03-12 VITALS — BP 128/70 | HR 74 | Ht 71.0 in | Wt 261.4 lb

## 2018-03-12 DIAGNOSIS — Z0000 Encounter for general adult medical examination without abnormal findings: Secondary | ICD-10-CM

## 2018-03-12 NOTE — Patient Instructions (Addendum)
Follow up with Dr.Paz as scheduled 04/16/18  Please schedule your next medicare wellness visit with me in 1 yr.  Continue to eat heart healthy diet (full of fruits, vegetables, whole grains, lean protein, water--limit salt, fat, and sugar intake) and increase physical activity as tolerated.   Daniel Cruz , Thank you for taking time to come for your Medicare Wellness Visit. I appreciate your ongoing commitment to your health goals. Please review the following plan we discussed and let me know if I can assist you in the future.   These are the goals we discussed: Goals    . Weight (lb) < 245 lb (111.1 kg)     Eat smaller portions and walk 4x/week for at least 9min.       This is a list of the screening recommended for you and due dates:  Health Maintenance  Topic Date Due  . Complete foot exam   05/08/2017  . Flu Shot  05/07/2018  . Eye exam for diabetics  05/08/2018  . Hemoglobin A1C  06/20/2018  . Colon Cancer Screening  01/08/2023  . Tetanus Vaccine  12/21/2025  .  Hepatitis C: One time screening is recommended by Center for Disease Control  (CDC) for  adults born from [birth year] through 1967.   Completed  . Pneumonia vaccines  Completed    Health Maintenance, Male A healthy lifestyle and preventive care is important for your health and wellness. Ask your health care provider about what schedule of regular examinations is right for you. What should I know about weight and diet? Eat a Healthy Diet  Eat plenty of vegetables, fruits, whole grains, low-fat dairy products, and lean protein.  Do not eat a lot of foods high in solid fats, added sugars, or salt.  Maintain a Healthy Weight Regular exercise can help you achieve or maintain a healthy weight. You should:  Do at least 150 minutes of exercise each week. The exercise should increase your heart rate and make you sweat (moderate-intensity exercise).  Do strength-training exercises at least twice a week.  Watch Your Levels  of Cholesterol and Blood Lipids  Have your blood tested for lipids and cholesterol every 5 years starting at 69 years of age. If you are at high risk for heart disease, you should start having your blood tested when you are 69 years old. You may need to have your cholesterol levels checked more often if: ? Your lipid or cholesterol levels are high. ? You are older than 69 years of age. ? You are at high risk for heart disease.  What should I know about cancer screening? Many types of cancers can be detected early and may often be prevented. Lung Cancer  You should be screened every year for lung cancer if: ? You are a current smoker who has smoked for at least 30 years. ? You are a former smoker who has quit within the past 15 years.  Talk to your health care provider about your screening options, when you should start screening, and how often you should be screened.  Colorectal Cancer  Routine colorectal cancer screening usually begins at 69 years of age and should be repeated every 5-10 years until you are 69 years old. You may need to be screened more often if early forms of precancerous polyps or small growths are found. Your health care provider may recommend screening at an earlier age if you have risk factors for colon cancer.  Your health care provider may recommend using  home test kits to check for hidden blood in the stool.  A small camera at the end of a tube can be used to examine your colon (sigmoidoscopy or colonoscopy). This checks for the earliest forms of colorectal cancer.  Prostate and Testicular Cancer  Depending on your age and overall health, your health care provider may do certain tests to screen for prostate and testicular cancer.  Talk to your health care provider about any symptoms or concerns you have about testicular or prostate cancer.  Skin Cancer  Check your skin from head to toe regularly.  Tell your health care provider about any new moles or  changes in moles, especially if: ? There is a change in a mole's size, shape, or color. ? You have a mole that is larger than a pencil eraser.  Always use sunscreen. Apply sunscreen liberally and repeat throughout the day.  Protect yourself by wearing long sleeves, pants, a wide-brimmed hat, and sunglasses when outside.  What should I know about heart disease, diabetes, and high blood pressure?  If you are 64-32 years of age, have your blood pressure checked every 3-5 years. If you are 68 years of age or older, have your blood pressure checked every year. You should have your blood pressure measured twice-once when you are at a hospital or clinic, and once when you are not at a hospital or clinic. Record the average of the two measurements. To check your blood pressure when you are not at a hospital or clinic, you can use: ? An automated blood pressure machine at a pharmacy. ? A home blood pressure monitor.  Talk to your health care provider about your target blood pressure.  If you are between 9-13 years old, ask your health care provider if you should take aspirin to prevent heart disease.  Have regular diabetes screenings by checking your fasting blood sugar level. ? If you are at a normal weight and have a low risk for diabetes, have this test once every three years after the age of 32. ? If you are overweight and have a high risk for diabetes, consider being tested at a younger age or more often.  A one-time screening for abdominal aortic aneurysm (AAA) by ultrasound is recommended for men aged 24-75 years who are current or former smokers. What should I know about preventing infection? Hepatitis B If you have a higher risk for hepatitis B, you should be screened for this virus. Talk with your health care provider to find out if you are at risk for hepatitis B infection. Hepatitis C Blood testing is recommended for:  Everyone born from [birth year] through 1967.  Anyone with known risk  factors for hepatitis C.  Sexually Transmitted Diseases (STDs)  You should be screened each year for STDs including gonorrhea and chlamydia if: ? You are sexually active and are younger than 69 years of age. ? You are older than 69 years of age and your health care provider tells you that you are at risk for this type of infection. ? Your sexual activity has changed since you were last screened and you are at an increased risk for chlamydia or gonorrhea. Ask your health care provider if you are at risk.  Talk with your health care provider about whether you are at high risk of being infected with HIV. Your health care provider may recommend a prescription medicine to help prevent HIV infection.  What else can I do?  Schedule regular health, dental, and eye  exams.  Stay current with your vaccines (immunizations).  Do not use any tobacco products, such as cigarettes, chewing tobacco, and e-cigarettes. If you need help quitting, ask your health care provider.  Limit alcohol intake to no more than 2 drinks per day. One drink equals 12 ounces of beer, 5 ounces of wine, or 1 ounces of hard liquor.  Do not use street drugs.  Do not share needles.  Ask your health care provider for help if you need support or information about quitting drugs.  Tell your health care provider if you often feel depressed.  Tell your health care provider if you have ever been abused or do not feel safe at home. This information is not intended to replace advice given to you by your health care provider. Make sure you discuss any questions you have with your health care provider. Document Released: 03/21/2008 Document Revised: 05/22/2016 Document Reviewed: 06/27/2015 Elsevier Interactive Patient Education  [employer].

## 2018-04-14 ENCOUNTER — Other Ambulatory Visit: Payer: Self-pay | Admitting: Internal Medicine

## 2018-04-16 ENCOUNTER — Ambulatory Visit: Payer: Medicare Other | Admitting: Internal Medicine

## 2018-05-06 ENCOUNTER — Other Ambulatory Visit: Payer: Self-pay | Admitting: Internal Medicine

## 2018-05-15 ENCOUNTER — Encounter: Payer: Self-pay | Admitting: Internal Medicine

## 2018-05-15 DIAGNOSIS — H04123 Dry eye syndrome of bilateral lacrimal glands: Secondary | ICD-10-CM | POA: Diagnosis not present

## 2018-05-15 DIAGNOSIS — H25813 Combined forms of age-related cataract, bilateral: Secondary | ICD-10-CM | POA: Diagnosis not present

## 2018-05-15 DIAGNOSIS — H524 Presbyopia: Secondary | ICD-10-CM | POA: Diagnosis not present

## 2018-05-15 DIAGNOSIS — H5201 Hypermetropia, right eye: Secondary | ICD-10-CM | POA: Diagnosis not present

## 2018-05-15 DIAGNOSIS — H35042 Retinal micro-aneurysms, unspecified, left eye: Secondary | ICD-10-CM | POA: Diagnosis not present

## 2018-05-15 DIAGNOSIS — Z7984 Long term (current) use of oral hypoglycemic drugs: Secondary | ICD-10-CM | POA: Diagnosis not present

## 2018-05-15 DIAGNOSIS — E119 Type 2 diabetes mellitus without complications: Secondary | ICD-10-CM | POA: Diagnosis not present

## 2018-05-15 DIAGNOSIS — H52223 Regular astigmatism, bilateral: Secondary | ICD-10-CM | POA: Diagnosis not present

## 2018-05-15 DIAGNOSIS — H5212 Myopia, left eye: Secondary | ICD-10-CM | POA: Diagnosis not present

## 2018-05-15 LAB — HM DIABETES EYE EXAM

## 2018-05-28 ENCOUNTER — Other Ambulatory Visit: Payer: Self-pay | Admitting: Internal Medicine

## 2018-06-16 ENCOUNTER — Encounter: Payer: Self-pay | Admitting: Internal Medicine

## 2018-06-16 ENCOUNTER — Ambulatory Visit (INDEPENDENT_AMBULATORY_CARE_PROVIDER_SITE_OTHER): Payer: Medicare Other | Admitting: Internal Medicine

## 2018-06-16 VITALS — BP 132/70 | HR 56 | Temp 97.6°F | Resp 16 | Ht 71.0 in | Wt 254.5 lb

## 2018-06-16 DIAGNOSIS — E118 Type 2 diabetes mellitus with unspecified complications: Secondary | ICD-10-CM | POA: Diagnosis not present

## 2018-06-16 DIAGNOSIS — I2510 Atherosclerotic heart disease of native coronary artery without angina pectoris: Secondary | ICD-10-CM

## 2018-06-16 DIAGNOSIS — M25511 Pain in right shoulder: Secondary | ICD-10-CM | POA: Diagnosis not present

## 2018-06-16 DIAGNOSIS — E7800 Pure hypercholesterolemia, unspecified: Secondary | ICD-10-CM | POA: Diagnosis not present

## 2018-06-16 DIAGNOSIS — I10 Essential (primary) hypertension: Secondary | ICD-10-CM

## 2018-06-16 LAB — BASIC METABOLIC PANEL
BUN: 22 mg/dL (ref 6–23)
CALCIUM: 9.7 mg/dL (ref 8.4–10.5)
CHLORIDE: 104 meq/L (ref 96–112)
CO2: 28 meq/L (ref 19–32)
Creatinine, Ser: 0.98 mg/dL (ref 0.40–1.50)
GFR: 80.48 mL/min (ref >60.00)
Glucose, Bld: 97 mg/dL (ref 70–99)
Potassium: 4.7 mEq/L (ref 3.5–5.1)
Sodium: 138 mEq/L (ref 135–145)

## 2018-06-16 LAB — LIPID PANEL
CHOL/HDL RATIO: 2
Cholesterol: 120 mg/dL (ref 0–200)
HDL: 51 mg/dL (ref >39.00)
LDL CALC: 54 mg/dL (ref 0–99)
NonHDL: 69.1
Triglycerides: 78 mg/dL (ref 0.0–149.0)
VLDL: 15.6 mg/dL (ref 0.0–40.0)

## 2018-06-16 LAB — HEMOGLOBIN A1C: HEMOGLOBIN A1C: 6.9 % — AB (ref 4.6–6.5)

## 2018-06-16 NOTE — Patient Instructions (Signed)
GO TO THE LAB : Get the blood work     GO TO THE FRONT DESK Schedule your next appointment for a  Check up in 4 months   

## 2018-06-16 NOTE — Assessment & Plan Note (Signed)
DM: Currently on glipizide, Tradjenta, Actos plus met, farxiga (new since the last A1c).  Doing better with diet and exercise, will check a A1c, if  better, will consider stop glipizide given history of CAD. HTN: Seems well controlled on carvedilol, Zestoretic.  Checking BMP High cholesterol: On Vytorin, check a FLP, last LFTs satisfactory CAD: Asymptomatic, to see cardiology soon. Shoulder pain: Exam negative, improved in the last few days with the stretching, few stretching techniques were discussed and taught to the patient.  Recommend Tylenol, sporadic Motrin is also okay.  Call if not improving. Preventive care: Recommend a flu shot this fall RTC 4 months

## 2018-06-16 NOTE — Progress Notes (Signed)
Pre visit review using our clinic review tool, if applicable. No additional management support is needed unless otherwise documented below in the visit note. 

## 2018-06-16 NOTE — Progress Notes (Signed)
Subjective:    Patient ID: Daniel Cruz, male    DOB: 1950/10/13, 67 y.o.   MRN: [id number]  DOS:  06/16/2018 Type of visit - description : rov Interval history: DM: Good compliance to medication, no ambulatory CBGs High cholesterol: Good compliant with medication, due for labs Complaint of shoulder pain, mostly when he moves his arms overhead, mild, still able to play golf.  In the last few days he started to stretch and symptoms got better. Still from time to time gets dizzy when he stands up quickly, symptoms last few seconds, denies HOH or tinnitus.  Review of Systems Denies neck pain No chest pain or difficulty breathing See last visit, had dyspepsia, all symptoms resolved. Diet has improved, also exercising more, walks 4 miles frequently.  As far as the dizziness, he denies LOC or strokelike symptoms such as slurred speech, facial numbness or diplopia   Past Medical History:  Diagnosis Date  . Anemia, iron deficiency    hx  . CAD (coronary artery disease)     NSTEMI with PCI of the mid and distal RCA with 50-60% LAD 09/2000, s/p cath 01/2009 with patent RCA stent and 90% LAD s/p PCI of LAD and right PDA  . Cholelithiasis   . DIABETES MELLITUS, TYPE II 02/26/2007  . Fatty liver 02/2009   per u/s  . GERD (gastroesophageal reflux disease)   . HTN (hypertension)   . OSA (obstructive sleep apnea)    can't tol. a CPAP  . Plantar fasciitis of left foot   . PVC (premature ventricular contraction) 06/06/2017  . UTI (lower urinary tract infection)    in the 80s and 11/09    Past Surgical History:  Procedure Laterality Date  . CARDIAC CATHETERIZATION  10/2008   stents  . CHOLECYSTECTOMY N/A 07/16/2016   Procedure: LAPAROSCOPIC CHOLECYSTECTOMY WITH INTRAOPERATIVE CHOLANGIOGRAM;  Surgeon: [name], MD;  Location: [facility];  Service: General;  Laterality: N/A;  . SEPTOPLASTY     w/ bilateral inferior turbinate reductions  . TONSILLECTOMY      Social History    Socioeconomic History  . Marital status: Married    Spouse name: Not on file  . Number of children: 2  . Years of education: Not on file  . Highest education level: Not on file  Occupational History  . 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 Financial resource strain: Not on file  . Food insecurity:    Worry: Not on file    Inability: Not on file  . Transportation needs:    Medical: Not on file    Non-medical: Not on file  Tobacco Use  . Smoking status: Former Smoker    Packs/day: 1.50    Years: 16.00    Pack years: 24.00    Last attempt to quit: 07/08/2000    Years since quitting: 17.9  . Smokeless tobacco: Never Used  . Tobacco comment: 2001  Substance and Sexual Activity  . Alcohol use: Yes    Comment: socially  . Drug use: No  . Sexual activity: Yes  Lifestyle  . Physical activity:    Days per week: Not on file    Minutes per session: Not on file  . Stress: Not on file  Relationships  . Social connections:    Talks on phone: Not on file    Gets together: Not on file    Attends religious service: Not on file  Active member of club or organization: Not on file    Attends meetings of clubs or organizations: Not on file    Relationship status: Not on file  . Intimate partner violence:    Fear of current or ex partner: Not on file    Emotionally abused: Not on file    Physically abused: Not on file    Forced sexual activity: Not on file  Other Topics Concern  . Not on file  Social History Narrative   Lives in [place], [place]   Lives w/ wife, 2 children, 2 g-kids   Retired April 2017             Allergies as of 06/16/2018      Reactions   Januvia [sitagliptin] Nausea Only, Other (See Comments)   Back Pain      Medication List        Accurate as of 06/16/18 10:08 AM. Always use your most recent med list.          aspirin EC 81 MG tablet Take 81 mg by mouth daily.   carvedilol  6.25 MG tablet Commonly known as:  COREG TAKE 1 TABLET (6.25 MG TOTAL) BY MOUTH 2 (TWO) TIMES DAILY WITH A MEAL.   dapagliflozin propanediol 10 MG Tabs tablet Commonly known as:  FARXIGA Take 10 mg by mouth daily.   ezetimibe-simvastatin 10-10 MG tablet Commonly known as:  VYTORIN Take 1 tablet by mouth daily.   glipiZIDE 5 MG 24 hr tablet Commonly known as:  GLUCOTROL XL Take 1 tablet (5 mg total) by mouth daily with breakfast.   ibuprofen 200 MG tablet Commonly known as:  ADVIL,MOTRIN Take 200 mg by mouth every 6 (six) hours as needed (For pain.).   linagliptin 5 MG Tabs tablet Commonly known as:  TRADJENTA Take 1 tablet (5 mg total) by mouth daily.   lisinopril-hydrochlorothiazide 20-12.5 MG tablet Commonly known as:  PRINZIDE,ZESTORETIC Take 1 tablet by mouth daily.   multivitamin tablet Take 1 tablet by mouth daily.   nitroGLYCERIN 0.4 MG SL tablet Commonly known as:  NITROSTAT Place 1 tablet (0.4 mg total) under the tongue every 5 (five) minutes x 3 doses as needed for chest pain.   pantoprazole 40 MG tablet Commonly known as:  PROTONIX Take 40 mg by mouth 2 (two) times daily.   pioglitazone-metformin 15-850 MG tablet Commonly known as:  ACTOPLUS MET Take 1 tablet by mouth 2 (two) times daily with a meal.   ranitidine 300 MG tablet Commonly known as:  ZANTAC Take 0.5 tablets (150 mg total) by mouth at bedtime.          Objective:   Physical Exam BP 132/70 (BP Location: Left Arm, Patient Position: Sitting, Cuff Size: Normal)   Pulse (!) 56   Temp 97.6 F (36.4 C) (Oral)   Resp 16   Ht _0  (1.803 m)   Wt 254 lb 8 oz (115.4 kg)   SpO2 96%   BMI 35.50 kg/m  General:   Well developed, NAD, see BMI.  HEENT:  Normocephalic . Face symmetric, atraumatic Lungs:  CTA B Normal respiratory effort, no intercostal retractions, no accessory muscle use. Heart: RRR,  no murmur.  No pretibial edema bilaterally  Skin: Not pale. Not  jaundice MSK: Shoulders symmetric, range of motion satisfactory, motor symmetric. Neurologic:  alert & oriented X3.  Speech normal, gait appropriate for age and unassisted Psych--  Cognition and judgment appear intact.  Cooperative with normal attention span and concentration.  Behavior appropriate. No anxious or depressed appearing.      Assessment & Plan:    Assessment DM DX 2008 HTN Hyperlipidemia CAD:  -MI 2001, syncope 2010 -07-2017: SOB, low risk stress test OSA, CPAP intolerant ED Mild chronic anemia, low iron stores. Saw GI 04-2017: no further w/u  GERD Gallbladder polyps, 2014, surgery 07-2016 Fatty liver 2010 Used to see  Dr Cena Benton DM: Currently on glipizide, Tradjenta, Actos plus met, farxiga (new since the last A1c).  Doing better with diet and exercise, will check a A1c, if  better, will consider stop glipizide given history of CAD. HTN: Seems well controlled on carvedilol, Zestoretic.  Checking BMP High cholesterol: On Vytorin, check a FLP, last LFTs satisfactory CAD: Asymptomatic, to see cardiology soon. Shoulder pain: Exam negative, improved in the last few days with the stretching, few stretching techniques were discussed and taught to the patient.  Recommend Tylenol, sporadic Motrin is also okay.  Call if not improving. Preventive care: Recommend a flu shot this fall RTC 4 months

## 2018-07-09 NOTE — Progress Notes (Signed)
Cardiology Office Note:    Date:  07/10/2018   ID:  Daniel Cruz, DOB 11/15/1950, MRN [id number]  PCP:  Colon Branch, MD  Cardiologist:  Fransico Him, MD    Referring MD: Colon Branch, MD   Chief Complaint  Patient presents with  . Coronary Artery Disease  . Hypertension  . Hyperlipidemia    History of Present Illness:    Daniel Cruz is a 67 y.o. male with a hx of ASCAD with NSTEMI 2001 with PCI of the mid and distal RCA with 50-60% prox LAD and subsequent PCI of 80% LAD and right PDA 01/2009, diabetes, hypertension, hyperlipidemia, obstructive sleep apnea and GERD  He is here today for followup and is doing well.  He denies any chest pain or pressure, SOB, DOE, PND, orthopnea, LE edema, dizziness, palpitations or syncope. He is compliant with his meds and is tolerating meds with no SE.    Past Medical History:  Diagnosis Date  . Anemia, iron deficiency    hx  . CAD (coronary artery disease)     NSTEMI with PCI of the mid and distal RCA with 50-60% LAD 09/2000, s/p cath 01/2009 with patent RCA stent and 90% LAD s/p PCI of LAD and right PDA  . Cholelithiasis   . DIABETES MELLITUS, TYPE II 02/26/2007  . Fatty liver 02/2009   per u/s  . GERD (gastroesophageal reflux disease)   . HTN (hypertension)   . OSA (obstructive sleep apnea)    can't tol. a CPAP  . Plantar fasciitis of left foot   . PVC (premature ventricular contraction) 06/06/2017  . UTI (lower urinary tract infection)    in the 80s and 11/09    Past Surgical History:  Procedure Laterality Date  . CARDIAC CATHETERIZATION  10/2008   stents  . CHOLECYSTECTOMY N/A 07/16/2016   Procedure: LAPAROSCOPIC CHOLECYSTECTOMY WITH INTRAOPERATIVE CHOLANGIOGRAM;  Surgeon: [name], MD;  Location: [facility];  Service: General;  Laterality: N/A;  . SEPTOPLASTY     w/ bilateral inferior turbinate reductions  . TONSILLECTOMY      Current Medications: Current Meds  Medication Sig  . aspirin EC 81 MG tablet Take 81 mg  by mouth daily.  . carvedilol (COREG) 6.25 MG tablet TAKE 1 TABLET (6.25 MG TOTAL) BY MOUTH 2 (TWO) TIMES DAILY WITH A MEAL.  . dapagliflozin propanediol (FARXIGA) 10 MG TABS tablet Take 10 mg by mouth daily.  Marland Kitchen ezetimibe-simvastatin (VYTORIN) 10-10 MG tablet Take 1 tablet by mouth daily.  Marland Kitchen glipiZIDE (GLUCOTROL XL) 5 MG 24 hr tablet Take 1 tablet (5 mg total) by mouth daily with breakfast.  . ibuprofen (ADVIL,MOTRIN) 200 MG tablet Take 200 mg by mouth every 6 (six) hours as needed (For pain.).  Marland Kitchen linagliptin (TRADJENTA) 5 MG TABS tablet Take 1 tablet (5 mg total) by mouth daily.  Marland Kitchen lisinopril-hydrochlorothiazide (PRINZIDE,ZESTORETIC) 20-12.5 MG tablet Take 1 tablet by mouth daily.  . Multiple Vitamin (MULTIVITAMIN) tablet Take 1 tablet by mouth daily.    . nitroGLYCERIN (NITROSTAT) 0.4 MG SL tablet Place 1 tablet (0.4 mg total) under the tongue every 5 (five) minutes x 3 doses as needed for chest pain.  . pantoprazole (PROTONIX) 40 MG tablet Take 40 mg by mouth 2 (two) times daily.  . pioglitazone-metformin (ACTOPLUS MET) 15-850 MG tablet Take 1 tablet by mouth 2 (two) times daily with a meal.     Allergies:   Januvia [sitagliptin]   Social History   Socioeconomic History  . Marital  status: Married    Spouse name: Not on file  . Number of children: 2  . Years of education: Not on file  . Highest education level: Not on file  Occupational History  . 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 Financial resource strain: Not on file  . Food insecurity:    Worry: Not on file    Inability: Not on file  . Transportation needs:    Medical: Not on file    Non-medical: Not on file  Tobacco Use  . Smoking status: Former Smoker    Packs/day: 1.50    Years: 16.00    Pack years: 24.00    Last attempt to quit: 07/08/2000    Years since quitting: 18.0  . Smokeless tobacco: Never Used  . Tobacco comment: 2001  Substance and  Sexual Activity  . Alcohol use: Yes    Comment: socially  . Drug use: No  . Sexual activity: Yes  Lifestyle  . Physical activity:    Days per week: Not on file    Minutes per session: Not on file  . Stress: Not on file  Relationships  . Social connections:    Talks on phone: Not on file    Gets together: Not on file    Attends religious service: Not on file    Active member of club or organization: Not on file    Attends meetings of clubs or organizations: Not on file    Relationship status: Not on file  Other Topics Concern  . Not on file  Social History Narrative   Lives in [place], [place]   Lives w/ wife, 2 children, 2 g-kids   Retired April 2017            Family History: The patient's family history includes COPD in his mother; Coronary artery disease in his father; Emphysema in his mother; Stroke in his father. There is no history of Cancer or Diabetes.  ROS:   Please see the history of present illness.    ROS  All other systems reviewed and negative.   EKGs/Labs/Other Studies Reviewed:    The following studies were reviewed today: none  EKG:  EKG is  ordered today.  The ekg ordered today demonstrates sinus bradycardia at 58bpm with nonspecific T wave abnormality  Recent Labs: 12/18/2017: ALT 17; Hemoglobin 12.6; Platelets 202.0 06/16/2018: BUN 22; Creatinine, Ser 0.98; Potassium 4.7; Sodium 138   Recent Lipid Panel    Component Value Date/Time   CHOL 120 06/16/2018 1031   TRIG 78.0 06/16/2018 1031   TRIG 106 09/15/2006 0947   HDL 51.00 06/16/2018 1031   CHOLHDL 2 06/16/2018 1031   VLDL 15.6 06/16/2018 1031   LDLCALC 54 06/16/2018 1031    Physical Exam:    VS:  BP 124/74   Pulse (!) 58   Ht _0  (1.803 m)   Wt 252 lb (114.3 kg)   BMI 35.15 kg/m     Wt Readings from Last 3 Encounters:  07/10/18 252 lb (114.3 kg)  06/16/18 254 lb 8 oz (115.4 kg)  03/12/18 261 lb 6.4 oz (118.6 kg)     GEN:  Well nourished, well developed in no acute  distress HEENT: Normal NECK: No JVD; No carotid bruits LYMPHATICS: No lymphadenopathy CARDIAC: RRR, no murmurs, rubs, gallops RESPIRATORY:  Clear to auscultation without rales, wheezing or rhonchi  ABDOMEN: Soft, non-tender, non-distended MUSCULOSKELETAL:  No edema; No deformity  SKIN:  Warm and dry NEUROLOGIC:  Alert and oriented x 3 PSYCHIATRIC:  Normal affect   ASSESSMENT:    1. Coronary artery disease involving native coronary artery of native heart without angina pectoris   2. Essential hypertension   3. Pure hypercholesterolemia    PLAN:    In order of problems listed above:  1.  ASCAD - s/p NSTEMI 2001 with PCI of the mid and distal RCA with 50-60% prox LAD and subsequent PCI of 80% LAD and right PDA 01/2009.  He denies any anginal sx.  He will continue on ASA 84m daily, BB and statin.   2.  HTN - BP is controlled on exam today.  He will continue on carvedilol 6.236mBID and Prinizide 20-12.37m83maily.  Creatinine was normal at 0.98 and K+ 4.7 on 06/16/2018.  3.  Hyperlipidemia - LDL goal is < 70.  LDL was 54 and ALT 17 on 06/16/2018. He will continue on Vytorin 10-10mg daily.     Medication Adjustments/Labs and Tests Ordered: Current medicines are reviewed at length with the patient today.  Concerns regarding medicines are outlined above.  Orders Placed This Encounter  Procedures  . EKG 12-Lead   No orders of the defined types were placed in this encounter.   Signed, TraFransico HimD  07/10/2018 8:07 AM    [place]

## 2018-07-10 ENCOUNTER — Ambulatory Visit (INDEPENDENT_AMBULATORY_CARE_PROVIDER_SITE_OTHER): Payer: Medicare Other | Admitting: Cardiology

## 2018-07-10 ENCOUNTER — Encounter: Payer: Self-pay | Admitting: Cardiology

## 2018-07-10 VITALS — BP 124/74 | HR 58 | Ht 71.0 in | Wt 252.0 lb

## 2018-07-10 DIAGNOSIS — I10 Essential (primary) hypertension: Secondary | ICD-10-CM | POA: Diagnosis not present

## 2018-07-10 DIAGNOSIS — I2510 Atherosclerotic heart disease of native coronary artery without angina pectoris: Secondary | ICD-10-CM

## 2018-07-10 DIAGNOSIS — E7800 Pure hypercholesterolemia, unspecified: Secondary | ICD-10-CM | POA: Diagnosis not present

## 2018-07-10 NOTE — Patient Instructions (Signed)
Medication Instructions:  Your physician recommends that you continue on your current medications as directed. Please refer to the Current Medication list given to you today.  Follow-Up: Your physician wants you to follow-up in: 1 year with Dr. Radford Pax. You will receive a reminder letter in the mail two months in advance. If you don't receive a letter, please call our office to schedule the follow-up appointment.  If you need a refill on your cardiac medications before your next appointment, please call your pharmacy.

## 2018-07-16 DIAGNOSIS — M9905 Segmental and somatic dysfunction of pelvic region: Secondary | ICD-10-CM | POA: Diagnosis not present

## 2018-07-16 DIAGNOSIS — M5416 Radiculopathy, lumbar region: Secondary | ICD-10-CM | POA: Diagnosis not present

## 2018-07-20 DIAGNOSIS — M9905 Segmental and somatic dysfunction of pelvic region: Secondary | ICD-10-CM | POA: Diagnosis not present

## 2018-07-20 DIAGNOSIS — M5416 Radiculopathy, lumbar region: Secondary | ICD-10-CM | POA: Diagnosis not present

## 2018-07-22 DIAGNOSIS — M5416 Radiculopathy, lumbar region: Secondary | ICD-10-CM | POA: Diagnosis not present

## 2018-07-22 DIAGNOSIS — M9905 Segmental and somatic dysfunction of pelvic region: Secondary | ICD-10-CM | POA: Diagnosis not present

## 2018-07-24 DIAGNOSIS — M9905 Segmental and somatic dysfunction of pelvic region: Secondary | ICD-10-CM | POA: Diagnosis not present

## 2018-07-24 DIAGNOSIS — M5416 Radiculopathy, lumbar region: Secondary | ICD-10-CM | POA: Diagnosis not present

## 2018-07-27 DIAGNOSIS — M5416 Radiculopathy, lumbar region: Secondary | ICD-10-CM | POA: Diagnosis not present

## 2018-07-27 DIAGNOSIS — M9905 Segmental and somatic dysfunction of pelvic region: Secondary | ICD-10-CM | POA: Diagnosis not present

## 2018-07-28 DIAGNOSIS — M9905 Segmental and somatic dysfunction of pelvic region: Secondary | ICD-10-CM | POA: Diagnosis not present

## 2018-07-28 DIAGNOSIS — M5416 Radiculopathy, lumbar region: Secondary | ICD-10-CM | POA: Diagnosis not present

## 2018-08-03 DIAGNOSIS — M5416 Radiculopathy, lumbar region: Secondary | ICD-10-CM | POA: Diagnosis not present

## 2018-08-03 DIAGNOSIS — M9905 Segmental and somatic dysfunction of pelvic region: Secondary | ICD-10-CM | POA: Diagnosis not present

## 2018-08-05 DIAGNOSIS — M5416 Radiculopathy, lumbar region: Secondary | ICD-10-CM | POA: Diagnosis not present

## 2018-08-05 DIAGNOSIS — M9905 Segmental and somatic dysfunction of pelvic region: Secondary | ICD-10-CM | POA: Diagnosis not present

## 2018-08-10 ENCOUNTER — Ambulatory Visit (INDEPENDENT_AMBULATORY_CARE_PROVIDER_SITE_OTHER): Payer: Medicare Other | Admitting: Internal Medicine

## 2018-08-10 ENCOUNTER — Encounter: Payer: Self-pay | Admitting: Internal Medicine

## 2018-08-10 VITALS — BP 126/68 | HR 58 | Temp 98.2°F | Resp 16 | Ht 71.0 in | Wt 258.1 lb

## 2018-08-10 DIAGNOSIS — Z23 Encounter for immunization: Secondary | ICD-10-CM

## 2018-08-10 DIAGNOSIS — M5410 Radiculopathy, site unspecified: Secondary | ICD-10-CM

## 2018-08-10 DIAGNOSIS — R29898 Other symptoms and signs involving the musculoskeletal system: Secondary | ICD-10-CM

## 2018-08-10 DIAGNOSIS — I2510 Atherosclerotic heart disease of native coronary artery without angina pectoris: Secondary | ICD-10-CM | POA: Diagnosis not present

## 2018-08-10 NOTE — Progress Notes (Signed)
Pre visit review using our clinic review tool, if applicable. No additional management support is needed unless otherwise documented below in the visit note. 

## 2018-08-10 NOTE — Progress Notes (Signed)
Subjective:    Patient ID: Daniel Cruz, male    DOB: [birth year], 67 y.o.   MRN: [id number]  DOS:  08/10/2018 Type of visit - description : acute Interval history:  Went to a golf trip 2 months ago and felt great. Shortly after started to have problems w/ the L leg: It simply "collapse" and stopped supporting him and he falls. Has happened 4-5 times, fortunately with no major injury during the fall. He also has developed some numbness on the left pretibial area.  Also has right shoulder stiffness, that is better under the care of a chiropractor.  Review of Systems Has minimal low back pain described as slight  tightness No generalized paresthesias except for for the left pretibial area No rash, fever chills or claudication   Past Medical History:  Diagnosis Date  . Anemia, iron deficiency    hx  . CAD (coronary artery disease)     NSTEMI with PCI of the mid and distal RCA with 50-60% LAD 09/2000, s/p cath 01/2009 with patent RCA stent and 90% LAD s/p PCI of LAD and right PDA  . Cholelithiasis   . DIABETES MELLITUS, TYPE II 02/26/2007  . Fatty liver 02/2009   per u/s  . GERD (gastroesophageal reflux disease)   . HTN (hypertension)   . OSA (obstructive sleep apnea)    can't tol. a CPAP  . Plantar fasciitis of left foot   . PVC (premature ventricular contraction) 06/06/2017  . UTI (lower urinary tract infection)    in the 80s and 11/09    Past Surgical History:  Procedure Laterality Date  . CARDIAC CATHETERIZATION  10/2008   stents  . CHOLECYSTECTOMY N/A 07/16/2016   Procedure: LAPAROSCOPIC CHOLECYSTECTOMY WITH INTRAOPERATIVE CHOLANGIOGRAM;  Surgeon: [name], MD;  Location: [facility];  Service: General;  Laterality: N/A;  . SEPTOPLASTY     w/ bilateral inferior turbinate reductions  . TONSILLECTOMY      Social History   Socioeconomic History  . Marital status: Married    Spouse name: Not on file  . Number of children: 2  . Years of education: Not on file    . Highest education level: Not on file  Occupational History  . 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 Financial resource strain: Not on file  . Food insecurity:    Worry: Not on file    Inability: Not on file  . Transportation needs:    Medical: Not on file    Non-medical: Not on file  Tobacco Use  . Smoking status: Former Smoker    Packs/day: 1.50    Years: 16.00    Pack years: 24.00    Last attempt to quit: 07/08/2000    Years since quitting: 18.1  . Smokeless tobacco: Never Used  . Tobacco comment: 2001  Substance and Sexual Activity  . Alcohol use: Yes    Comment: socially  . Drug use: No  . Sexual activity: Yes  Lifestyle  . Physical activity:    Days per week: Not on file    Minutes per session: Not on file  . Stress: Not on file  Relationships  . Social connections:    Talks on phone: Not on file    Gets together: Not on file    Attends religious service: Not on file    Active member of club or organization: Not on file    Attends meetings of clubs or organizations:  Not on file    Relationship status: Not on file  . Intimate partner violence:    Fear of current or ex partner: Not on file    Emotionally abused: Not on file    Physically abused: Not on file    Forced sexual activity: Not on file  Other Topics Concern  . Not on file  Social History Narrative   Lives in [place], [place]   Lives w/ wife, 2 children, 2 g-kids   Retired April 2017             Allergies as of 08/10/2018      Reactions   Januvia [sitagliptin] Nausea Only, Other (See Comments)   Back Pain      Medication List        Accurate as of 08/10/18  1:07 PM. Always use your most recent med list.          aspirin EC 81 MG tablet Take 81 mg by mouth daily.   carvedilol 6.25 MG tablet Commonly known as:  COREG TAKE 1 TABLET (6.25 MG TOTAL) BY MOUTH 2 (TWO) TIMES DAILY WITH A MEAL.   dapagliflozin propanediol 10  MG Tabs tablet Commonly known as:  FARXIGA Take 10 mg by mouth daily.   ezetimibe-simvastatin 10-10 MG tablet Commonly known as:  VYTORIN Take 1 tablet by mouth daily.   glipiZIDE 5 MG 24 hr tablet Commonly known as:  GLUCOTROL XL Take 1 tablet (5 mg total) by mouth daily with breakfast.   ibuprofen 200 MG tablet Commonly known as:  ADVIL,MOTRIN Take 200 mg by mouth every 6 (six) hours as needed (For pain.).   linagliptin 5 MG Tabs tablet Commonly known as:  TRADJENTA Take 1 tablet (5 mg total) by mouth daily.   lisinopril-hydrochlorothiazide 20-12.5 MG tablet Commonly known as:  PRINZIDE,ZESTORETIC Take 1 tablet by mouth daily.   multivitamin tablet Take 1 tablet by mouth daily.   nitroGLYCERIN 0.4 MG SL tablet Commonly known as:  NITROSTAT Place 1 tablet (0.4 mg total) under the tongue every 5 (five) minutes x 3 doses as needed for chest pain.   pantoprazole 40 MG tablet Commonly known as:  PROTONIX Take 40 mg by mouth 2 (two) times daily.   pioglitazone-metformin 15-850 MG tablet Commonly known as:  ACTOPLUS MET Take 1 tablet by mouth 2 (two) times daily with a meal.          Objective:   Physical Exam  Skin:      BP 126/68 (BP Location: Left Arm, Patient Position: Sitting, Cuff Size: Normal)   Pulse (!) 58   Temp 98.2 F (36.8 C) (Oral)   Resp 16   Ht 5' 11" (1.803 m)   Wt 258 lb 2 oz (117.1 kg)   SpO2 97%   BMI 36.00 kg/m  General:   Well developed, NAD, BMI noted. HEENT:  Normocephalic . Face symmetric, atraumatic MSK: No TTP of the lumbosacral spine or SI joints Hip rotation normal Knees normal to inspection and palpation Vascular: Good pedal and femoral pulses Skin: Not pale. Not jaundice Neurologic:  alert & oriented X3.  Speech normal, gait appropriate for age and unassisted DTRs absent in the knees and present in the ankles. Psych--  Cognition and judgment appear intact.  Cooperative with normal attention span and concentration.    Behavior appropriate. No anxious or depressed appearing.      Assessment & Plan:    Assessment DM DX 2008 HTN Hyperlipidemia CAD:  -MI 2001, syncope 2010 -  07-2017: SOB, low risk stress test OSA, CPAP intolerant ED Mild chronic anemia, low iron stores. Saw GI 04-2017: no further w/u  GERD Gallbladder polyps, 2014, surgery 07-2016 Fatty liver 2010 Used to see  Dr Allyson Sabal  PLAN Radiculopathy? Patient presents with left pretibial paresthesias and falls d/t what seems to be L leg weakness; neuro exam confirms decreased sensitivity of the left pretibial area. Although he has no major back pain clinically there is a question of a L4 radiculopathy. Vascular exam normal Plan: Ortho referral, fall prevention discussed

## 2018-08-10 NOTE — Assessment & Plan Note (Signed)
Radiculopathy? Patient presents with left pretibial paresthesias and falls d/t what seems to be L leg weakness; neuro exam confirms decreased sensitivity of the left pretibial area. Although he has no major back pain clinically there is a question of a L4 radiculopathy. Vascular exam normal Plan: Ortho referral, fall prevention discussed

## 2018-08-10 NOTE — Patient Instructions (Signed)
We are referring you to a orthopedic doctor Please be careful with falls.  Cane?   Fall Prevention in the Home Falls can cause injuries and can affect people from all age groups. There are many simple things that you can do to make your home safe and to help prevent falls. What can I do on the outside of my home?  Regularly repair the edges of walkways and driveways and fix any cracks.  Remove high doorway thresholds.  Trim any shrubbery on the main path into your home.  Use bright outdoor lighting.  Clear walkways of debris and clutter, including tools and rocks.  Regularly check that handrails are securely fastened and in good repair. Both sides of any steps should have handrails.  Install guardrails along the edges of any raised decks or porches.  Have leaves, snow, and ice cleared regularly.  Use sand or salt on walkways during winter months.  In the garage, clean up any spills right away, including grease or oil spills. What can I do in the bathroom?  Use night lights.  Install grab bars by the toilet and in the tub and shower. Do not use towel bars as grab bars.  Use non-skid mats or decals on the floor of the tub or shower.  If you need to sit down while you are in the shower, use a plastic, non-slip stool.  Keep the floor dry. Immediately clean up any water that spills on the floor.  Remove soap buildup in the tub or shower on a regular basis.  Attach bath mats securely with double-sided non-slip rug tape.  Remove throw rugs and other tripping hazards from the floor. What can I do in the bedroom?  Use night lights.  Make sure that a bedside light is easy to reach.  Do not use oversized bedding that drapes onto the floor.  Have a firm chair that has side arms to use for getting dressed.  Remove throw rugs and other tripping hazards from the floor. What can I do in the kitchen?  Clean up any spills right away.  Avoid walking on wet floors.  Place  frequently used items in easy-to-reach places.  If you need to reach for something above you, use a sturdy step stool that has a grab bar.  Keep electrical cables out of the way.  Do not use floor polish or wax that makes floors slippery. If you have to use wax, make sure that it is non-skid floor wax.  Remove throw rugs and other tripping hazards from the floor. What can I do in the stairways?  Do not leave any items on the stairs.  Make sure that there are handrails on both sides of the stairs. Fix handrails that are broken or loose. Make sure that handrails are as long as the stairways.  Check any carpeting to make sure that it is firmly attached to the stairs. Fix any carpet that is loose or worn.  Avoid having throw rugs at the top or bottom of stairways, or secure the rugs with carpet tape to prevent them from moving.  Make sure that you have a light switch at the top of the stairs and the bottom of the stairs. If you do not have them, have them installed. What are some other fall prevention tips?  Wear closed-toe shoes that fit well and support your feet. Wear shoes that have rubber soles or low heels.  When you use a stepladder, make sure that it is completely  opened and that the sides are firmly locked. Have someone hold the ladder while you are using it. Do not climb a closed stepladder.  Add color or contrast paint or tape to grab bars and handrails in your home. Place contrasting color strips on the first and last steps.  Use mobility aids as needed, such as canes, walkers, scooters, and crutches.  Turn on lights if it is dark. Replace any light bulbs that burn out.  Set up furniture so that there are clear paths. Keep the furniture in the same spot.  Fix any uneven floor surfaces.  Choose a carpet design that does not hide the edge of steps of a stairway.  Be aware of any and all pets.  Review your medicines with your healthcare provider. Some medicines can cause  dizziness or changes in blood pressure, which increase your risk of falling. Talk with your health care provider about other ways that you can decrease your risk of falls. This may include working with a physical therapist or trainer to improve your strength, balance, and endurance. This information is not intended to replace advice given to you by your health care provider. Make sure you discuss any questions you have with your health care provider. Document Released: 09/13/2002 Document Revised: 02/20/2016 Document Reviewed: 10/28/2014 Elsevier Interactive Patient Education  [employer].

## 2018-08-11 ENCOUNTER — Ambulatory Visit (INDEPENDENT_AMBULATORY_CARE_PROVIDER_SITE_OTHER): Payer: Medicare Other | Admitting: Family Medicine

## 2018-08-11 ENCOUNTER — Encounter (INDEPENDENT_AMBULATORY_CARE_PROVIDER_SITE_OTHER): Payer: Self-pay | Admitting: Family Medicine

## 2018-08-11 DIAGNOSIS — R200 Anesthesia of skin: Secondary | ICD-10-CM | POA: Diagnosis not present

## 2018-08-11 DIAGNOSIS — M79605 Pain in left leg: Secondary | ICD-10-CM

## 2018-08-11 MED ORDER — DIAZEPAM 5 MG PO TABS: ORAL_TABLET | ORAL | 0 refills | Status: DC

## 2018-08-11 NOTE — Progress Notes (Signed)
I saw and examined the patient with Dr. Okey Dupre and agree with assessment and plan as outlined.  Left hip flexor and knee extensor weakness, consistent with L4 radiculopathy.  Had x-rays per chiropractor.  Will proceed with MRI followed by continued chiro or possibly PT depending on findings.

## 2018-08-11 NOTE — Progress Notes (Signed)
  Daniel Cruz - 67 y.o. male MRN [id number]  Date of birth: March 30, 1951    SUBJECTIVE:      Chief Complaint:/ HPI:  67 year old male presents with left leg weakness and numbness. Patient states that the symptoms began approximately 2 months ago.  He was on a 6-day golfing trip prior to this.  After returning he again went golfing at which time his left leg collapsed underneath him.  He was able to complete 9 holes of golf but notes difficulty due to feeling of weakness.  Later that day he attempted to go bowling and again his left leg collapsed.  He denies having any back, hip, or knee pain at any point.  He denies any prior injuries to his back.  He denies any bowel/bladder symptoms.  No recent fevers or chills.  No associated skin changes.  No areas of swelling or erythema. Patient states that he was previously seeing a chiropractor for his right shoulder.  During these sessions he also evaluated his back.  X-rays were obtained at that time.   ROS:     See HPI  PERTINENT  PMH / PSH FH / / SH:  Past Medical, Surgical, Social, and Family History Reviewed & Updated in the EMR.  Pertinent findings include:  History of MI.  Claustrophobia.  OBJECTIVE: There were no vitals taken for this visit.  Physical Exam:  Vital signs are reviewed.  GEN: Alert and oriented, NAD Pulm: Breathing unlabored PSY: normal mood, congruent affect  MSK: Lumbar spine: - Inspection: no gross deformity or asymmetry, swelling or ecchymosis - Palpation: No TTP over the spinous processes, paraspinal muscles, or SI joints b/l - ROM: full active ROM of the lumbar spine in flexion and extension without pain or worsening symptoms - Strength: 4/5 strength in left knee extension and hip flexion.  Otherwise, 5/5 strength throughout - Neuro: Dullness to palpation in the distal L4 dermatomal distribution.  Otherwise, no sensory deficits.  1+ L4 reflexes symmetric bilaterally.  2+ S1 reflexes bilaterally - Special  testing: Negative straight leg raise  Bilateral hips: No pain with passive IR/ER  Left knee: No obvious deformity No tenderness palpation Full range of motion No cruciate or collateral ligament stability.  ASSESSMENT & PLAN:  1.  Left leg weakness- patient has symptoms consistent with L4 radiculopathy.  He has prior x-rays that, per him, showed some degenerative changes.  X-rays are not available for review today.  Because his exam is concerning for weakness, we will pursue MRI for further evaluation.  Will order open MRI and pretreat with Valium due to his claustrophobia.  Patient declined a prescription for a cane to assist with ambulation.  He will follow-up after his MRI.

## 2018-08-12 ENCOUNTER — Encounter: Payer: Self-pay | Admitting: Internal Medicine

## 2018-08-26 ENCOUNTER — Ambulatory Visit
Admission: RE | Admit: 2018-08-26 | Discharge: 2018-08-26 | Disposition: A | Discharge status: Home / Self Care | Payer: Medicare Other | Source: Ambulatory Visit | Attending: Family Medicine | Admitting: Family Medicine

## 2018-08-26 ENCOUNTER — Telehealth (INDEPENDENT_AMBULATORY_CARE_PROVIDER_SITE_OTHER): Payer: Self-pay | Admitting: Family Medicine

## 2018-08-26 DIAGNOSIS — M79605 Pain in left leg: Secondary | ICD-10-CM

## 2018-08-26 DIAGNOSIS — R200 Anesthesia of skin: Secondary | ICD-10-CM

## 2018-08-26 DIAGNOSIS — M48061 Spinal stenosis, lumbar region without neurogenic claudication: Secondary | ICD-10-CM

## 2018-08-26 NOTE — Addendum Note (Signed)
Addended by: Hortencia Pilar on: 08/26/2018 01:08 PM   Modules accepted: Orders

## 2018-08-26 NOTE — Telephone Encounter (Signed)
MRI shows abnormalities at multiple levels, but I suspect the L4-5 level is the source of current left leg symptoms.  Nerve could be getting compressed.  Treatment options would include:  - Trial of physical therapy  - Referral for lumbar epidural steroid injection  - Referral to spine surgeon to discuss surgery if other options fail.

## 2018-08-27 ENCOUNTER — Ambulatory Visit (INDEPENDENT_AMBULATORY_CARE_PROVIDER_SITE_OTHER): Payer: Medicare Other | Admitting: Rehabilitative and Restorative Service Providers"

## 2018-08-27 ENCOUNTER — Encounter: Payer: Self-pay | Admitting: Rehabilitative and Restorative Service Providers"

## 2018-08-27 DIAGNOSIS — M6281 Muscle weakness (generalized): Secondary | ICD-10-CM | POA: Diagnosis not present

## 2018-08-27 DIAGNOSIS — M5416 Radiculopathy, lumbar region: Secondary | ICD-10-CM | POA: Diagnosis not present

## 2018-08-27 DIAGNOSIS — R29898 Other symptoms and signs involving the musculoskeletal system: Secondary | ICD-10-CM

## 2018-08-27 NOTE — Therapy (Signed)
[facility] [place] [address] [place], [place], [address] Phone: [phone]   Fax:  [phone]  Physical Therapy Evaluation  Patient Details  Name: Daniel Cruz MRN: [id number] Date of Birth: 1951/01/25 Referring Provider (PT): Dr Eunice Blase    Encounter Date: 08/27/2018  PT End of Session - 08/27/18 1142    Visit Number  1    Number of Visits  12    Date for PT Re-Evaluation  10/08/18    PT Start Time  7026    PT Stop Time  1250    PT Time Calculation (min)  68 min    Activity Tolerance  Patient tolerated treatment well       Past Medical History:  Diagnosis Date  . Anemia, iron deficiency    hx  . CAD (coronary artery disease)     NSTEMI with PCI of the mid and distal RCA with 50-60% LAD 09/2000, s/p cath 01/2009 with patent RCA stent and 90% LAD s/p PCI of LAD and right PDA  . Cholelithiasis   . DIABETES MELLITUS, TYPE II 02/26/2007  . Fatty liver 02/2009   per u/s  . GERD (gastroesophageal reflux disease)   . HTN (hypertension)   . OSA (obstructive sleep apnea)    can't tol. a CPAP  . Plantar fasciitis of left foot   . PVC (premature ventricular contraction) 06/06/2017  . UTI (lower urinary tract infection)    in the 80s and 11/09    Past Surgical History:  Procedure Laterality Date  . CARDIAC CATHETERIZATION  10/2008   stents  . CHOLECYSTECTOMY N/A 07/16/2016   Procedure: LAPAROSCOPIC CHOLECYSTECTOMY WITH INTRAOPERATIVE CHOLANGIOGRAM;  Surgeon: [name], MD;  Location: [facility];  Service: General;  Laterality: N/A;  . SEPTOPLASTY     w/ bilateral inferior turbinate reductions  . TONSILLECTOMY      There were no vitals filed for this visit.   Subjective Assessment - 08/27/18 1149    Subjective  Patient reports that he fell ~ 3 months ago when carrying heavy item out of a store - he had no symptoms at the time. About 1-2 weeks later he fell while playing golf x2, and again later in the day  while bowling. He was seen by chiropractor x 8 visits for back and Rt shoulder pain. He had no improvement in the weakness in the Lt LE. He continues to report weakness in the Lt LE and some numbness in the anterior shin.     Pertinent History  Arthritis; AODM; HTN; MI ~ 2001; gall bladder surgery 2018; sleep apnea; episode of sciatica 5-6 yrs ago resolved with chiropractic care in ~ 2 months - Rt shoulder pain     Diagnostic tests  MRI - degenerative changes lumbar spin - spinal stenosis     Patient Stated Goals  get Lt LE stronger and return to golf and regular activities     Currently in Pain?  No/denies    Pain Location  Back         [facility] PT Assessment - 08/27/18 0001      Assessment   Medical Diagnosis  Lumbar radiculopathy; Lt LE weakness     Referring Provider (PT)  Dr Legrand Como Hilts     Onset Date/Surgical Date  06/07/18    Hand Dominance  Left    Next MD Visit  PRN     Prior Therapy  chiropractic care       Precautions   Precautions  None      Balance Screen   Has the patient fallen in the past 6 months  Yes    How many times?  5    Has the patient had a decrease in activity level because of a fear of falling?   Yes    Is the patient reluctant to leave their home because of a fear of falling?   No      Home Environment   Living Environment  Private residence    Living Arrangements  Spouse/significant other    Type of [place] Access  Level entry    [employer]   lives on main level - stairs optional      Prior Function   Level of Independence  Independent    Vocation  Retired    Stage manager- retired ~ 2 yrs ago     Leisure  golfing; bowling; grandchildren; walking 4 x/wk 2-3 miles;    sits in soft sofa      Observation/Other Assessments   Observations  decreased thigh girth Lt compared to [employer] on Therapeutic Outcomes (FOTO)   57% limitation       Sensation   Additional Comments  anterior Lt shin numb -  constant       Posture/Postural Control   Posture Comments  head forward; shoulders rounded and elevated; flexed forward at hips       AROM   Overall AROM Comments  bilat hips WFL's some tightness in hip extension     AROM Assessment Site  --   generalized feeling of stiffness ext/lat flex     Lumbar Flexion  90%     Lumbar Extension  70%    Lumbar - Right Side Bend  90%    Lumbar - Left Side Bend  90%    Lumbar - Right Rotation  80%    Lumbar - Left Rotation  80%       Strength   Right Hip Flexion  5/5    Right Hip Extension  4+/5    Right Hip ABduction  5/5    Right Hip ADduction  5/5    Left Hip Flexion  4+/5    Left Hip Extension  4/5    Left Hip ABduction  4/5    Left Hip ADduction  4/5    Right Knee Flexion  5/5    Right Knee Extension  5/5    Left Knee Flexion  --   5-/5   Left Knee Extension  --   5-/5   Right Ankle Dorsiflexion  5/5    Right Ankle Plantar Flexion  5/5    Left Ankle Dorsiflexion  4+/5    Left Ankle Plantar Flexion  4+/5      Flexibility   Hamstrings  tight Lt ~ 75 deg; Rt 80 deg     Quadriceps  tight bilat     ITB  WFL's     Piriformis  tighter Lt than Rt       Palpation   Spinal mobility  hypomobile lumbar with CPA mobs     Palpation comment  palpable tightness Lt lumbar and posterior hip musculature       Special Tests   Other special tests  (-) SLR; (-) [place]       High Level Balance   High Level Balance Comments  SLS Rt 10 sec; Lt 6 sec -  use of UE for balance                 Objective measurements completed on examination: See above findings.      [place] [facility] Adult PT Treatment/Exercise - 08/27/18 0001      Self-Care   Self-Care  --   initiated back care education      Lumbar Exercises: Stretches   Passive Hamstring Stretch  Left;2 reps;30 seconds   supine with strap    Quad Stretch  Left;2 reps;30 seconds   prone with strap      Lumbar Exercises: Standing   Other Standing Lumbar Exercises  standing 2-3 min equal  wt bearing bilat LE's       Lumbar Exercises: Supine   Clam  10 reps;2 seconds   with green theraband - alternating LE's    Bridge  10 reps;5 seconds   core engaged    Other Supine Lumbar Exercises  4 part core 10 sec x 10              PT Education - 08/27/18 1254    Education Details  HEP; posture     Person(s) Educated  Patient    Methods  Explanation;Demonstration;Tactile cues;Verbal cues;Handout    Comprehension  Verbalized understanding;Returned demonstration;Verbal cues required;Tactile cues required          PT Long Term Goals - 08/27/18 1303      PT LONG TERM GOAL #1   Title  Increased Lt LE strength by 1/2 to 1 muscle grade 10/08/18    Time  6    Period  Weeks    Status  New      PT LONG TERM GOAL #2   Title  Patient to move sit to stand x 10 without limitation/fatigue 10/08/18    Time  6    Period  Weeks    Status  New      PT LONG TERM GOAL #3   Title  Patient reports and demonstrates good body mechanics with transfers and transitional movements 10/08/18    Time  6    Period  Weeks    Status  New      PT LONG TERM GOAL #4   Title  Independent in HEP 10/08/18    Time  6    Period  Weeks    Status  New      PT LONG TERM GOAL #5   Title  Improve FOTO to </= 40% limitation 10/08/18    Time  6    Period  Weeks    Status  New             Plan - 08/27/18 1258    Clinical Impression Statement  Richardson Landry presents with Lt LE weakness and numbness as well sa decresaed balance. He has had some LBP but symptoms have resolved. Patient has limited his activities due to falls and fear of falling due to Lt LE weakness. Patient will benefit form PT to address core stabilization and LE weakness.     History and Personal Factors relevant to plan of care:  episode of scaitica with LE pain ~ 5-6 yrs ago resolved with chiripractic treatment     Clinical Presentation  Stable    Clinical Decision Making  Low    Rehab Potential  Good    PT Frequency  2x / week    PT  Duration  6 weeks    PT Treatment/Interventions  Patient/family education;ADLs/Self Care Home Management;Cryotherapy;Electrical Stimulation;Iontophoresis  4mg /ml Dexamethasone;Moist Heat;Ultrasound;Dry needling;Manual techniques;Neuromuscular re-education;Therapeutic activities;Therapeutic exercise;Gait training;Balance training    PT Next Visit Plan  review HEP progress with core stabilization and LE strengthening     Consulted and Agree with Plan of Care  Patient       Patient will benefit from skilled therapeutic intervention in order to improve the following deficits and impairments:  Postural dysfunction, Improper body mechanics, Increased fascial restricitons, Decreased mobility, Decreased range of motion, Decreased strength, Decreased activity tolerance, Decreased balance  Visit Diagnosis: Radiculopathy, lumbar region - Plan: PT plan of care cert/re-cert  Muscle weakness (generalized) - Plan: PT plan of care cert/re-cert  Other symptoms and signs involving the musculoskeletal system - Plan: PT plan of care cert/re-cert     Problem List Patient Active Problem List   Diagnosis Date Noted  . PVC (premature ventricular contraction) 06/06/2017  . PCP NOTES >>>>>>>>>>>>>>>>>>>>>>>>> 11/20/2015  . Anemia 05/08/2015  . GERD, GB polyps  07/29/2013  . Annual physical exam 05/15/2011  . Pain in joint, ankle and foot 03/19/2010  . ERECTILE DYSFUNCTION 11/08/2008  . Hyperlipidemia 07/20/2007  . DM (diabetes mellitus) with complications ([place]) [id number]  . HTN (hypertension) 02/26/2007  . CAD (coronary artery disease) 02/26/2007  . GERD 02/26/2007  . SLEEP APNEA 02/26/2007    Jewett Mcgann Nilda Simmer PT, MPH  08/27/2018, 1:08 PM  [facility] [place], [place], [address] Phone: [phone]   Fax:  [phone]  Name: Daniel Cruz MRN: [id number] Date of Birth: 1950/11/30

## 2018-08-27 NOTE — Patient Instructions (Addendum)
Abdominal Bracing With Pelvic Floor (Hook-Lying)    With neutral spine, tighten pelvic floor and abdominals sucking belly button to back bone; tighten muscles in the low back at waist; exhale.  Repeat __10_ times. Do several __ times a day. Progress to do this in sitting; standing; walking and with functional activities   HIP: Hamstrings - Supine  Place strap around foot. Raise leg up, keeping knee straight.  Bend opposite knee to protect back if indicated. Hold 30 seconds. 3 reps per set, 2-3 sets per day  Quads / HF, Prone KNEE: Quadriceps - Prone    Place strap around ankle. Bring ankle toward buttocks. Press hip into surface. Hold 30 seconds. Repeat 3 times per session. Do 2-3 sessions per day.  Bridging    Slowly raise buttocks from floor, keeping core tight. Hold 5 sec  Repeat _10___ times per set. Do __1-2__ sets per session. Do __1__ sessions per day.   Strengthening: Hip Abductor - Resisted    With band looped around both legs above knees, hold one knee still and move the opposite out to the side. Repeat with opposite leg Pause 2-3 sec Repeat __10__ times per set. Do _1-2___ sets per session. Do _1 times/day    Sit to Stand    Core tight. Sit on edge of chair, feet flat on floor. Stand upright, extending knees fully. Repeat __2-3 __ times per set. Do __1-2__ sets per session. Do __2-3__ sessions per day.   Standing - work on tightening core and staying straight  Stand during commercials of TV show  2-3 times/day   Sleeping on Back  Place pillow under knees. A pillow with cervical support and a roll around waist are also helpful. Copyright  VHI. All rights reserved.  Sleeping on Side Place pillow between knees. Use cervical support under neck and a roll around waist as needed. Copyright  VHI. All rights reserved.   Sleeping on Stomach   If this is the only desirable sleeping position, place pillow under lower legs, and under stomach or chest as  needed.  Posture - Sitting   Sit upright, head facing forward. Try using a roll to support lower back. Keep shoulders relaxed, and avoid rounded back. Keep hips level with knees. Avoid crossing legs for long periods. Stand to Sit / Sit to Stand   To sit: Bend knees to lower self onto front edge of chair, then scoot back on seat. To stand: Reverse sequence by placing one foot forward, and scoot to front of seat. Use rocking motion to stand up.   Work Height and Reach  Ideal work height is no more than 2 to 4 inches below elbow level when standing, and at elbow level when sitting. Reaching should be limited to arm's length, with elbows slightly bent.  Bending  Bend at hips and knees, not back. Keep feet shoulder-width apart.    Posture - Standing   Good posture is important. Avoid slouching and forward head thrust. Maintain curve in low back and align ears over shoul- ders, hips over ankles.  Alternating Positions   Alternate tasks and change positions frequently to reduce fatigue and muscle tension. Take rest breaks. Computer Work   Position work to Programmer, multimedia. Use proper work and seat height. Keep shoulders back and down, wrists straight, and elbows at right angles. Use chair that provides full back support. Add footrest and lumbar roll as needed.  Getting Into / Out of Car  Lower self onto seat, scoot back,  then bring in one leg at a time. Reverse sequence to get out.  Dressing  Lie on back to pull socks or slacks over feet, or sit and bend leg while keeping back straight.    Housework - Sink  Place one foot on ledge of cabinet under sink when standing at sink for prolonged periods.   Pushing / Pulling  Pushing is preferable to pulling. Keep back in proper alignment, and use leg muscles to do the work.  Deep Squat   Squat and lift with both arms held against upper trunk. Tighten stomach muscles without holding breath. Use smooth movements to avoid jerking.    Avoid Twisting   Avoid twisting or bending back. Pivot around using foot movements, and bend at knees if needed when reaching for articles.  Carrying Luggage   Distribute weight evenly on both sides. Use a cart whenever possible. Do not twist trunk. Move body as a unit.   Lifting Principles .Maintain proper posture and head alignment. .Slide object as close as possible before lifting. .Move obstacles out of the way. .Test before lifting; ask for help if too heavy. .Tighten stomach muscles without holding breath. .Use smooth movements; do not jerk. .Use legs to do the work, and pivot with feet. .Distribute the work load symmetrically and close to the center of trunk. .Push instead of pull whenever possible.   Ask For Help   Ask for help and delegate to others when possible. Coordinate your movements when lifting together, and maintain the low back curve.  Log Roll   Lying on back, bend left knee and place left arm across chest. Roll all in one movement to the right. Reverse to roll to the left. Always move as one unit. Housework - Sweeping  Use long-handled equipment to avoid stooping.   Housework - Wiping  Position yourself as close as possible to reach work surface. Avoid straining your back.  Laundry - Unloading Wash   To unload small items at bottom of washer, lift leg opposite to arm being used to reach.  [place] close to area to be raked. Use arm movements to do the work. Keep back straight and avoid twisting.     Cart  When reaching into cart with one arm, lift opposite leg to keep back straight.   Getting Into / Out of Bed  Lower self to lie down on one side by raising legs and lowering head at the same time. Use arms to assist moving without twisting. Bend both knees to roll onto back if desired. To sit up, start from lying on side, and use same move-ments in reverse. Housework - Vacuuming  Hold the vacuum with arm held at side.  Step back and forth to move it, keeping head up. Avoid twisting.   Laundry - IT consultant so that bending and twisting can be avoided.   Laundry - Unloading Dryer  Squat down to reach into clothes dryer or use a reacher.  Gardening - Weeding / Probation officer or Kneel. Knee pads may be helpful.

## 2018-08-31 ENCOUNTER — Other Ambulatory Visit: Payer: Self-pay

## 2018-08-31 MED ORDER — CARVEDILOL 6.25 MG PO TABS: 6.2500 mg | ORAL_TABLET | Freq: Two times a day (BID) | ORAL | 1 refills | Status: DC

## 2018-09-01 ENCOUNTER — Encounter: Payer: Self-pay | Admitting: Rehabilitative and Restorative Service Providers"

## 2018-09-01 ENCOUNTER — Ambulatory Visit (INDEPENDENT_AMBULATORY_CARE_PROVIDER_SITE_OTHER): Payer: Medicare Other | Admitting: Rehabilitative and Restorative Service Providers"

## 2018-09-01 DIAGNOSIS — M5416 Radiculopathy, lumbar region: Secondary | ICD-10-CM

## 2018-09-01 DIAGNOSIS — R29898 Other symptoms and signs involving the musculoskeletal system: Secondary | ICD-10-CM

## 2018-09-01 DIAGNOSIS — M6281 Muscle weakness (generalized): Secondary | ICD-10-CM | POA: Diagnosis not present

## 2018-09-01 NOTE — Therapy (Signed)
[place], [place], [address] Phone: [phone]   Fax:  [phone]  Physical Therapy Treatment  Patient Details  Name: Daniel Cruz MRN: [id number] Date of Birth: [birth year] Referring Provider (PT): Dr Eunice Blase    Encounter Date: 09/01/2018  PT End of Session - 09/01/18 0934    Visit Number  2    Number of Visits  12    Date for PT Re-Evaluation  10/08/18    PT Start Time  0930    PT Stop Time  1015    PT Time Calculation (min)  45 min    Activity Tolerance  Patient tolerated treatment well       Past Medical History:  Diagnosis Date  . Anemia, iron deficiency    hx  . CAD (coronary artery disease)     NSTEMI with PCI of the mid and distal RCA with 50-60% LAD 09/2000, s/p cath 01/2009 with patent RCA stent and 90% LAD s/p PCI of LAD and right PDA  . Cholelithiasis   . DIABETES MELLITUS, TYPE II 02/26/2007  . Fatty liver 02/2009   per u/s  . GERD (gastroesophageal reflux disease)   . HTN (hypertension)   . OSA (obstructive sleep apnea)    can't tol. a CPAP  . Plantar fasciitis of left foot   . PVC (premature ventricular contraction) 06/06/2017  . UTI (lower urinary tract infection)    in the 80s and 11/09    Past Surgical History:  Procedure Laterality Date  . CARDIAC CATHETERIZATION  10/2008   stents  . CHOLECYSTECTOMY N/A 07/16/2016   Procedure: LAPAROSCOPIC CHOLECYSTECTOMY WITH INTRAOPERATIVE CHOLANGIOGRAM;  Surgeon: [name], MD;  Location: [facility];  Service: General;  Laterality: N/A;  . SEPTOPLASTY     w/ bilateral inferior turbinate reductions  . TONSILLECTOMY      There were no vitals filed for this visit.  Subjective Assessment - 09/01/18 0936    Subjective  Patient reports that he is feeling a little stronger. Can now do 4 sit to stand exercises. No pain in the back or leg.     Currently in Pain?  No/denies                       [facility]  Adult PT Treatment/Exercise - 09/01/18 0001      Exercises   Exercises  --   encouraging pt to engage core with all exercises      Lumbar Exercises: Stretches   Passive Hamstring Stretch  Left;2 reps;30 seconds   supine with strap    Quad Stretch  Left;2 reps;30 seconds   prone with strap    Piriformis Stretch Limitations  cramping in the groin       Lumbar Exercises: Aerobic   Nustep  L5 x 6 min U/LE's (arms 11)      Lumbar Exercises: Standing   Functional Squats  10 reps    Functional Squats Limitations  focus on equal wt bearing     Other Standing Lumbar Exercises  standing 2-3 min equal wt bearing bilat LE's     Other Standing Lumbar Exercises  alternate touch to 12 inch step x 20; SLS x 20 sec x 3 each LE       Lumbar Exercises: Seated   Sit to Stand  --   3 reps    Other Seated Lumbar Exercises  ankle DF x 10 x 2 set greeen TB; x10 blue  TB       Lumbar Exercises: Supine   Clam  --   blue TB    Bridge  10 reps;5 seconds   core engaged    Other Supine Lumbar Exercises  4 part core 10 sec x 10       Lumbar Exercises: Sidelying   Hip Abduction  Left;10 reps;3 seconds   hips forward; leading up with heel      Knee/Hip Exercises: Standing   Hip Flexion  AROM;Stengthening;Right;Left;10 reps;Knee bent   tapping foot to 12 inch step - alternating LE's - UE support   Hip Abduction  AROM;Stengthening;Right;Left;10 reps;Knee straight   leading with heel    Hip Extension  AROM;Stengthening;Right;Left;10 reps;Knee straight    Lateral Step Up  Left;2 sets;10 reps;Hand Hold: 2;Step Height: 4"    Forward Step Up  Left;2 sets;10 reps;Step Height: 6";Hand Hold: 2    Functional Squat  10 reps   shallow knee bend - dropping hips down    SLS  20 sec x 3 reps UE support as needed              PT Education - 09/01/18 1018    Education Details  HEP     Person(s) Educated  Patient    Methods  Explanation;Demonstration;Tactile cues;Verbal cues;Handout    Comprehension   Verbalized understanding;Returned demonstration;Verbal cues required          PT Long Term Goals - 08/27/18 1303      PT LONG TERM GOAL #1   Title  Increased Lt LE strength by 1/2 to 1 muscle grade 10/08/18    Time  6    Period  Weeks    Status  New      PT LONG TERM GOAL #2   Title  Patient to move sit to stand x 10 without limitation/fatigue 10/08/18    Time  6    Period  Weeks    Status  New      PT LONG TERM GOAL #3   Title  Patient reports and demonstrates good body mechanics with transfers and transitional movements 10/08/18    Time  6    Period  Weeks    Status  New      PT LONG TERM GOAL #4   Title  Independent in HEP 10/08/18    Time  6    Period  Weeks    Status  New      PT LONG TERM GOAL #5   Title  Improve FOTO to </= 40% limitation 10/08/18    Time  6    Period  Weeks    Status  New            Plan - 09/01/18 0936    Clinical Impression Statement  Patient reports consistent HEP - feeling stronger and the numbness is slightly improved. Added exercise without difficulty. Progressing toward goals of rehab. Patient interested in shoulder eval - will request referral from MD.     Rehab Potential  Good    PT Frequency  2x / week    PT Duration  6 weeks    PT Treatment/Interventions  Patient/family education;ADLs/Self Care Home Management;Cryotherapy;Electrical Stimulation;Iontophoresis 4mg /ml Dexamethasone;Moist Heat;Ultrasound;Dry needling;Manual techniques;Neuromuscular re-education;Therapeutic activities;Therapeutic exercise;Gait training;Balance training    PT Next Visit Plan  review HEP progress with core stabilization and LE strengthening - shoulder eval as ordered    Consulted and Agree with Plan of Care  Patient       Patient will  benefit from skilled therapeutic intervention in order to improve the following deficits and impairments:  Postural dysfunction, Improper body mechanics, Increased fascial restricitons, Decreased mobility, Decreased range of  motion, Decreased strength, Decreased activity tolerance, Decreased balance  Visit Diagnosis: Radiculopathy, lumbar region  Muscle weakness (generalized)  Other symptoms and signs involving the musculoskeletal system     Problem List Patient Active Problem List   Diagnosis Date Noted  . PVC (premature ventricular contraction) 06/06/2017  . PCP NOTES >>>>>>>>>>>>>>>>>>>>>>>>> 11/20/2015  . Anemia 05/08/2015  . GERD, GB polyps  07/29/2013  . Annual physical exam 05/15/2011  . Pain in joint, ankle and foot 03/19/2010  . ERECTILE DYSFUNCTION 11/08/2008  . Hyperlipidemia 07/20/2007  . DM (diabetes mellitus) with complications ([place]) 75/64/3329  . HTN (hypertension) 02/26/2007  . CAD (coronary artery disease) 02/26/2007  . GERD 02/26/2007  . SLEEP APNEA 02/26/2007    Aengus Sauceda Nilda Simmer PT, MPH  09/01/2018, 12:28 PM  [facility] [place], [place], [address] Phone: [phone]   Fax:  [phone]  Name: Daniel Cruz MRN: [id number] Date of Birth: 1950/10/16

## 2018-09-01 NOTE — Patient Instructions (Addendum)
Dorsiflexion: Resisted    Facing anchor, tubing around left foot, pull toward face.  Repeat ___10_ times per set. Do __2-3__ sets per session. Do ___1_ sessions per day.   Forward    Facing step, place one leg on step, flexed at hip. Step up slowly, bringing hips in line with knee and shoulder. Bring other foot onto step. Reverse process to step back down. Repeat with other leg. Do _10___ repetitions, 2-3__ sets.   FUNCTIONAL MOBILITY: Lateral Step Up    Step up sideways, leading with right leg. Lower weight with left to tap heel on right  __10_ reps per set, _1-3_ sets per day, __   Strengthening: Hip Abduction (Side-Lying)    Tighten muscles on front of left thigh, then lift leg ____ inches from surface, keeping knee locked.  Hips forward lead up with heel Repeat ___10_ times per set. Do ___2-3_ sets per session. Do __1__ sessions per day.   Standing with core tight leg straight Bring heel back x10 Repeat with opposite leg  Standing with core tight leg straight Bring heel back sightly and lift out to the side Lead with heel  10 reps each side   Shallow knee bends - drop hips down behind  10 reps 10 sec hold

## 2018-09-02 ENCOUNTER — Encounter: Payer: Self-pay | Admitting: Internal Medicine

## 2018-09-02 DIAGNOSIS — M25611 Stiffness of right shoulder, not elsewhere classified: Secondary | ICD-10-CM

## 2018-09-02 DIAGNOSIS — M79605 Pain in left leg: Secondary | ICD-10-CM

## 2018-09-07 ENCOUNTER — Ambulatory Visit (INDEPENDENT_AMBULATORY_CARE_PROVIDER_SITE_OTHER): Payer: Medicare Other | Admitting: Physical Therapy

## 2018-09-07 ENCOUNTER — Encounter: Payer: Self-pay | Admitting: Physical Therapy

## 2018-09-07 DIAGNOSIS — M5416 Radiculopathy, lumbar region: Secondary | ICD-10-CM

## 2018-09-07 DIAGNOSIS — M6281 Muscle weakness (generalized): Secondary | ICD-10-CM

## 2018-09-07 DIAGNOSIS — R29898 Other symptoms and signs involving the musculoskeletal system: Secondary | ICD-10-CM

## 2018-09-07 NOTE — Therapy (Signed)
[place], [place], [address] Phone: [phone]   Fax:  [phone]  Patient Details  Name: Daniel Cruz MRN: [id number] Date of Birth: March 13, 1951 Referring Provider:  Eunice Blase, MD  Encounter Date: 09/07/2018  Patient arrived requesting evaluation of his shoulder.  Explained to patient that he will need to see a PT for eval of this new referral. Patient not interested in treatment today for LE/core strengthening, "I have exercises and they are going extremely well. I'm pleased".  Patient requested cancellation of therapy appt and scheduling for eval of shoulder for next visit.  Encouraged patient to continue his current HEP; pt verbalized understanding.   Kerin Perna, PTA 09/07/18 8:18 AM  [facility] [place], [place], [address] Phone: [phone]   Fax:  [phone]

## 2018-09-08 ENCOUNTER — Ambulatory Visit (INDEPENDENT_AMBULATORY_CARE_PROVIDER_SITE_OTHER): Payer: Medicare Other | Admitting: Physical Therapy

## 2018-09-08 ENCOUNTER — Encounter: Payer: Self-pay | Admitting: Physical Therapy

## 2018-09-08 DIAGNOSIS — M6281 Muscle weakness (generalized): Secondary | ICD-10-CM

## 2018-09-08 DIAGNOSIS — M25511 Pain in right shoulder: Secondary | ICD-10-CM

## 2018-09-08 DIAGNOSIS — R29898 Other symptoms and signs involving the musculoskeletal system: Secondary | ICD-10-CM | POA: Diagnosis not present

## 2018-09-08 NOTE — Patient Instructions (Signed)
Access Code: [email or web address]  URL: [email or web address]  Date: 09/08/2018  Prepared by: Faustino Congress   Exercises  Doorway Pec Stretch at 90 Degrees Abduction - 3 reps - 1 sets - 30 sec hold - 1x daily - 7x weekly  Scapular Retraction with Resistance - 10 reps - 1 sets - 5 sec hold - 1x daily - 7x weekly  Shoulder Internal Rotation with Resistance - 10 reps - 1 sets - 1x daily - 7x weekly  Shoulder External Rotation with Anchored Resistance - 10 reps - 1 sets - 1x daily - 7x weekly

## 2018-09-08 NOTE — Therapy (Signed)
[place], [place], [address] Phone: [phone]   Fax:  [phone]  Physical Therapy Re-Evaluation  Patient Details  Name: Daniel Cruz MRN: [id number] Date of Birth: 05/29/1951 Referring Provider (PT): Dr Legrand Como Hilts / Kathlene November, MD   Encounter Date: 09/08/2018  PT End of Session - 09/08/18 1038    Visit Number  3    Number of Visits  12    Date for PT Re-Evaluation  10/08/18   10/20/18 for Rt shoulder   PT Start Time  0932    PT Stop Time  1009    PT Time Calculation (min)  37 min    Activity Tolerance  Patient tolerated treatment well       Past Medical History:  Diagnosis Date  . Anemia, iron deficiency    hx  . CAD (coronary artery disease)     NSTEMI with PCI of the mid and distal RCA with 50-60% LAD 09/2000, s/p cath 01/2009 with patent RCA stent and 90% LAD s/p PCI of LAD and right PDA  . Cholelithiasis   . DIABETES MELLITUS, TYPE II 02/26/2007  . Fatty liver 02/2009   per u/s  . GERD (gastroesophageal reflux disease)   . HTN (hypertension)   . OSA (obstructive sleep apnea)    can't tol. a CPAP  . Plantar fasciitis of left foot   . PVC (premature ventricular contraction) 06/06/2017  . UTI (lower urinary tract infection)    in the 80s and 11/09    Past Surgical History:  Procedure Laterality Date  . CARDIAC CATHETERIZATION  10/2008   stents  . CHOLECYSTECTOMY N/A 07/16/2016   Procedure: LAPAROSCOPIC CHOLECYSTECTOMY WITH INTRAOPERATIVE CHOLANGIOGRAM;  Surgeon: [name], MD;  Location: [facility];  Service: General;  Laterality: N/A;  . SEPTOPLASTY     w/ bilateral inferior turbinate reductions  . TONSILLECTOMY      There were no vitals filed for this visit.   Subjective Assessment - 09/08/18 0934    Subjective  Pt arrives today with new referral for Rt shoulder pain, worse in AM but can get progressively more painful with activity.  Pt reports Rt shoulder pain x 3  months, and seen by PCP and orthopedic MD.      Pertinent History  Arthritis; AODM; HTN; MI ~ 2001; gall bladder surgery 2018; sleep apnea; episode of sciatica 5-6 yrs ago resolved with chiropractic care in ~ 2 months - Rt shoulder pain     Diagnostic tests  MRI - degenerative changes lumbar spin - spinal stenosis     Patient Stated Goals  get Lt LE stronger and return to golf and regular activities; improve pain and be pain free with Rt shoulder     Currently in Pain?  No/denies    Pain Location  Back    Multiple Pain Sites  Yes    Pain Score  3   up to 10/10; at best 1/10   Pain Location  Shoulder    Pain Orientation  Right    Pain Descriptors / Indicators  Sore;Sharp;Shooting;Aching;Dull    Pain Type  Chronic pain;Acute pain    Pain Onset  More than a month ago    Pain Frequency  Constant    Aggravating Factors   leaning to Rt with arm propping self up; horizontal adduction in abduction, overhead activity    Pain Relieving Factors  keeping arm close to body, ibuprofen  [facility] PT Assessment - 09/08/18 0938      Assessment   Medical Diagnosis  Lumbar radiculopathy; Lt LE weakness; Rt shoulder stiffness    Referring Provider (PT)  Dr Legrand Como Hilts / Kathlene November, MD    Onset Date/Surgical Date  --   3 months ago   Hand Dominance  Left    Next MD Visit  PRN    Prior Therapy  chiropractic care       Precautions   Precautions  None      Restrictions   Weight Bearing Restrictions  No      Balance Screen   Has the patient fallen in the past 6 months  Yes    How many times?  6    Has the patient had a decrease in activity level because of a fear of falling?   Yes    Is the patient reluctant to leave their home because of a fear of falling?   No      Home Film/video editor residence    Additional Comments  independent with ADLs, no difficulty with ADLs but reports some pain      Prior Function   Level of Independence  Independent    Vocation   Retired    Stage manager- retired ~ 2 yrs ago     Leisure  golfing; bowling; grandchildren; walking 4 x/wk 2-3 miles;    sits in soft sofa      Cognition   Overall Cognitive Status  Within Functional Limits for tasks assessed      Posture/Postural Control   Posture Comments  head forward; shoulders rounded and elevated; flexed forward at hips       ROM / Strength   AROM / PROM / Strength  Strength      AROM   Overall AROM Comments  bil shoulders WNL; pain with all motion in Rt shoulder    AROM Assessment Site  --    Right/Left Shoulder  --      Strength   Strength Assessment Site  Shoulder;Elbow    Right/Left Shoulder  Right;Left    Right Shoulder Flexion  3/5    Right Shoulder ABduction  3/5    Right Shoulder Internal Rotation  4/5    Right Shoulder External Rotation  3/5    Left Shoulder Flexion  5/5    Left Shoulder ABduction  5/5    Left Shoulder Internal Rotation  5/5    Left Shoulder External Rotation  5/5    Right/Left Elbow  Right;Left    Right Elbow Flexion  5/5    Right Elbow Extension  5/5    Left Elbow Flexion  5/5    Left Elbow Extension  5/5      Palpation   Palpation comment  Rt shoulder muscle atrophy noted; no significant tenderness or trigger points noted, but pt reports pain along tricep into lats occasionally      Special Tests    Special Tests  Rotator Cuff Impingement    Rotator Cuff Impingment tests  Michel Bickers test;Empty Can test      Hawkins-Kennedy test   Findings  Positive    Side  Right      Empty Can test   Findings  Positive    Side  Right                Objective measurements completed on examination: See above findings.  [facility] Adult PT Treatment/Exercise - 09/08/18 0938      Self-Care   Self-Care  Other Self-Care Comments    Other Self-Care Comments   shoulder assessment and clinical findings; POC and goals of care in regards to shoulder and low back.  Pt verbalized understanding       Exercises   Exercises  Shoulder      Shoulder Exercises: Standing   External Rotation  Right;10 reps;Theraband    Theraband Level (Shoulder External Rotation)  Level 4 (Blue)    Internal Rotation  Right;10 reps;Theraband    Theraband Level (Shoulder Internal Rotation)  Level 2 (Red)    Row  Both;10 reps;Theraband   5 sec hold   Theraband Level (Shoulder Row)  Level 4 (Blue)      Shoulder Exercises: Stretch   Other Shoulder Stretches  doorway stretch 2 x 30 sec             PT Education - 09/08/18 1037    Education Details  HEP, see self care    Person(s) Educated  Patient    Methods  Explanation;Demonstration;Handout    Comprehension  Verbalized understanding;Returned demonstration;Need further instruction          PT Long Term Goals - 09/08/18 1038      PT LONG TERM GOAL #1   Title  Increased Lt LE strength by 1/2 to 1 muscle grade 10/08/18    Time  6    Period  Weeks    Status  New      PT LONG TERM GOAL #2   Title  Patient to move sit to stand x 10 without limitation/fatigue 10/08/18    Time  6    Period  Weeks    Status  New      PT LONG TERM GOAL #3   Title  Patient reports and demonstrates good body mechanics with transfers and transitional movements 10/08/18    Time  6    Period  Weeks    Status  New      PT LONG TERM GOAL #4   Title  Independent in HEP 10/08/18    Time  6    Period  Weeks    Status  New      PT LONG TERM GOAL #5   Title  Improve FOTO to </= 40% limitation 10/08/18    Time  6    Period  Weeks    Status  New      PT LONG TERM GOAL #6   Title  report Rt shoulder pain < 4/10 with activity for decreased pain and improved function    Status  New    Target Date  10/20/18      PT LONG TERM GOAL #7   Title  demonstrate Rt shoulder strength at least 4/5 for improved function and mobiltiy    Status  New    Target Date  10/20/18      PT LONG TERM GOAL #8   Title  perform Rt shoulder ROM without pain for improved function and decreased  pain with ADLs    Status  New    Target Date  10/20/18             Plan - 09/08/18 1040    Clinical Impression Statement  Pt arrives today with new order for Rt shoulder pain and stiffness.  Clinical findings consistent with RTC impingement and tendinopathy.  Pt demonstrates decreased strength and pain with ROM and resistance testing.  Pt will benefit from PT to address deficits listed.  Plan to continue to see for LBP and weakness but anticipate d/c and transition to focus solely on shoulder in next 1-2 visits.    Rehab Potential  Good    PT Frequency  2x / week    PT Duration  6 weeks    PT Treatment/Interventions  Patient/family education;ADLs/Self Care Home Management;Cryotherapy;Electrical Stimulation;Iontophoresis 4mg /ml Dexamethasone;Moist Heat;Ultrasound;Dry needling;Manual techniques;Neuromuscular re-education;Therapeutic activities;Therapeutic exercise;Gait training;Balance training;Vasopneumatic Device;Taping    PT Next Visit Plan  review HEP progress with core stabilization and LE strengthening; review shoulder HEP and progress PRN, manual/modalities PRN    PT Home Exercise Plan  Access Code: [email or web address]     Consulted and Agree with Plan of Care  Patient       Patient will benefit from skilled therapeutic intervention in order to improve the following deficits and impairments:  Postural dysfunction, Improper body mechanics, Increased fascial restricitons, Decreased mobility, Decreased range of motion, Decreased strength, Decreased activity tolerance, Decreased balance  Visit Diagnosis: Acute pain of right shoulder - Plan: PT plan of care cert/re-cert  Muscle weakness (generalized) - Plan: PT plan of care cert/re-cert  Other symptoms and signs involving the musculoskeletal system - Plan: PT plan of care cert/re-cert     Problem List Patient Active Problem List   Diagnosis Date Noted  . PVC (premature ventricular contraction) 06/06/2017  . PCP NOTES  >>>>>>>>>>>>>>>>>>>>>>>>> 11/20/2015  . Anemia 05/08/2015  . GERD, GB polyps  07/29/2013  . Annual physical exam 05/15/2011  . Pain in joint, ankle and foot 03/19/2010  . ERECTILE DYSFUNCTION 11/08/2008  . Hyperlipidemia 07/20/2007  . DM (diabetes mellitus) with complications ([place]) 88/91/6945  . HTN (hypertension) 02/26/2007  . CAD (coronary artery disease) 02/26/2007  . GERD 02/26/2007  . SLEEP APNEA 02/26/2007      Laureen Abrahams, PT, DPT 09/08/18 10:45 AM    [facility] [place], [place], [address] Phone: [phone]   Fax:  [phone]  Name: OAK DOREY MRN: [id number] Date of Birth: 10-Nov-1950

## 2018-09-10 ENCOUNTER — Ambulatory Visit (INDEPENDENT_AMBULATORY_CARE_PROVIDER_SITE_OTHER): Payer: Medicare Other | Admitting: Rehabilitative and Restorative Service Providers"

## 2018-09-10 DIAGNOSIS — R29898 Other symptoms and signs involving the musculoskeletal system: Secondary | ICD-10-CM

## 2018-09-10 DIAGNOSIS — M25511 Pain in right shoulder: Secondary | ICD-10-CM | POA: Diagnosis not present

## 2018-09-10 DIAGNOSIS — M5416 Radiculopathy, lumbar region: Secondary | ICD-10-CM | POA: Diagnosis not present

## 2018-09-10 DIAGNOSIS — M6281 Muscle weakness (generalized): Secondary | ICD-10-CM | POA: Diagnosis not present

## 2018-09-10 NOTE — Therapy (Signed)
[place], [place], [address] Phone: [phone]   Fax:  [phone]  Physical Therapy Treatment  Patient Details  Name: Daniel Cruz MRN: [id number] Date of Birth: 03/30/1951 Referring Provider (PT): Dr Legrand Como Hilts / Kathlene November, MD   Encounter Date: 09/10/2018  PT End of Session - 09/10/18 1110    Visit Number  4    Number of Visits  12    Date for PT Re-Evaluation  10/08/18    PT Start Time  1059    PT Stop Time  1145    PT Time Calculation (min)  46 min    Activity Tolerance  Patient tolerated treatment well       Past Medical History:  Diagnosis Date  . Anemia, iron deficiency    hx  . CAD (coronary artery disease)     NSTEMI with PCI of the mid and distal RCA with 50-60% LAD 09/2000, s/p cath 01/2009 with patent RCA stent and 90% LAD s/p PCI of LAD and right PDA  . Cholelithiasis   . DIABETES MELLITUS, TYPE II 02/26/2007  . Fatty liver 02/2009   per u/s  . GERD (gastroesophageal reflux disease)   . HTN (hypertension)   . OSA (obstructive sleep apnea)    can't tol. a CPAP  . Plantar fasciitis of left foot   . PVC (premature ventricular contraction) 06/06/2017  . UTI (lower urinary tract infection)    in the 80s and 11/09    Past Surgical History:  Procedure Laterality Date  . CARDIAC CATHETERIZATION  10/2008   stents  . CHOLECYSTECTOMY N/A 07/16/2016   Procedure: LAPAROSCOPIC CHOLECYSTECTOMY WITH INTRAOPERATIVE CHOLANGIOGRAM;  Surgeon: [name], MD;  Location: [facility];  Service: General;  Laterality: N/A;  . SEPTOPLASTY     w/ bilateral inferior turbinate reductions  . TONSILLECTOMY      There were no vitals filed for this visit.  Subjective Assessment - 09/10/18 1103    Subjective  Shoulder is feeling better just with the couple of days doing the exercises. Had some aching and soreness last night maybe from overdoing the exercises. LE strength is feeling better.  Pleased with progress.     Currently in Pain?  No/denies    Pain Score  4    Pain Location  Shoulder    Pain Orientation  Right    Pain Descriptors / Indicators  Aching;Sore    Pain Type  Chronic pain    Pain Onset  More than a month ago    Pain Frequency  Intermittent                       [facility] Adult PT Treatment/Exercise - 09/10/18 0001      Self-Care   Other Self-Care Comments   myofacial ball release work       Neuro Re-ed    Neuro Re-ed Details   postural correction engaging posterior shoulder girdle       Lumbar Exercises: Standing   Other Standing Lumbar Exercises  lateral stepping with green TB; SLS with toe taps fwd/side/back alternating LE's; standing reach forward to chair with SLS x 5       Lumbar Exercises: Supine   Clam  --   provided black TB for home      Shoulder Exercises: Seated   Other Seated Exercises  scapular depression into red ball in sitting 5 sec hold x 15  Shoulder Exercises: Standing   External Rotation  Right;10 reps;Theraband    Theraband Level (Shoulder External Rotation)  Level 3 (Green)    Internal Rotation  Right;10 reps;Theraband    Theraband Level (Shoulder Internal Rotation)  Level 3 (Green)    Extension  Strengthening;Both;10 reps;Theraband    Theraband Level (Shoulder Extension)  Level 3 (Green)    Row  Both;10 reps;Theraband   5 sec hold   Theraband Level (Shoulder Row)  Level 3 (Green)    Retraction  Strengthening;Both;10 reps;Theraband    Theraband Level (Shoulder Retraction)  Level 2 (Red)      Shoulder Exercises: Pulleys   Flexion  --   10 sec hold x 10 reps      Shoulder Exercises: Stretch   Other Shoulder Stretches  3 way doorway stretch 30 sec x 2 reps each position              PT Education - 09/10/18 1139    Education Details  HEP     Person(s) Educated  Patient    Methods  Explanation;Demonstration;Tactile cues;Verbal cues;Handout    Comprehension  Verbalized understanding;Returned  demonstration;Verbal cues required;Tactile cues required          PT Long Term Goals - 09/08/18 1038      PT LONG TERM GOAL #1   Title  Increased Lt LE strength by 1/2 to 1 muscle grade 10/08/18    Time  6    Period  Weeks    Status  New      PT LONG TERM GOAL #2   Title  Patient to move sit to stand x 10 without limitation/fatigue 10/08/18    Time  6    Period  Weeks    Status  New      PT LONG TERM GOAL #3   Title  Patient reports and demonstrates good body mechanics with transfers and transitional movements 10/08/18    Time  6    Period  Weeks    Status  New      PT LONG TERM GOAL #4   Title  Independent in HEP 10/08/18    Time  6    Period  Weeks    Status  New      PT LONG TERM GOAL #5   Title  Improve FOTO to </= 40% limitation 10/08/18    Time  6    Period  Weeks    Status  New      PT LONG TERM GOAL #6   Title  report Rt shoulder pain < 4/10 with activity for decreased pain and improved function    Status  New    Target Date  10/20/18      PT LONG TERM GOAL #7   Title  demonstrate Rt shoulder strength at least 4/5 for improved function and mobiltiy    Status  New    Target Date  10/20/18      PT LONG TERM GOAL #8   Title  perform Rt shoulder ROM without pain for improved function and decreased pain with ADLs    Status  New    Target Date  10/20/18            Plan - 09/10/18 1111    Clinical Impression Statement  Neuromuscular re-education working on posterior shoulder girdle stability and position. Progressed with exercises for Rt shoulder and Lt LE adding stretching/strengthening/balance activities. Patient tolerated all well.     Rehab Potential  Good  PT Frequency  2x / week    PT Duration  6 weeks    PT Treatment/Interventions  Patient/family education;ADLs/Self Care Home Management;Cryotherapy;Electrical Stimulation;Iontophoresis 4mg /ml Dexamethasone;Moist Heat;Ultrasound;Dry needling;Manual techniques;Neuromuscular re-education;Therapeutic  activities;Therapeutic exercise;Gait training;Balance training;Vasopneumatic Device;Taping    PT Next Visit Plan  review HEP progress with core stabilization and LE strengthening; review shoulder HEP and progress PRN, manual/modalities PRN    PT Home Exercise Plan  Access Code: [email or web address]     Consulted and Agree with Plan of Care  Patient       Patient will benefit from skilled therapeutic intervention in order to improve the following deficits and impairments:  Postural dysfunction, Improper body mechanics, Increased fascial restricitons, Decreased mobility, Decreased range of motion, Decreased strength, Decreased activity tolerance, Decreased balance  Visit Diagnosis: Acute pain of right shoulder  Muscle weakness (generalized)  Other symptoms and signs involving the musculoskeletal system  Radiculopathy, lumbar region     Problem List Patient Active Problem List   Diagnosis Date Noted  . PVC (premature ventricular contraction) 06/06/2017  . PCP NOTES >>>>>>>>>>>>>>>>>>>>>>>>> 11/20/2015  . Anemia 05/08/2015  . GERD, GB polyps  07/29/2013  . Annual physical exam 05/15/2011  . Pain in joint, ankle and foot 03/19/2010  . ERECTILE DYSFUNCTION 11/08/2008  . Hyperlipidemia 07/20/2007  . DM (diabetes mellitus) with complications ([facility]) 38/46/6599  . HTN (hypertension) 02/26/2007  . CAD (coronary artery disease) 02/26/2007  . GERD 02/26/2007  . SLEEP APNEA 02/26/2007    Carlethia Mesquita Nilda Simmer PT, MPH  09/10/2018, 1:40 PM  [facility] [place], [place], [address] Phone: [phone]   Fax:  [phone]  Name: Daniel Cruz MRN: [id number] Date of Birth: 1950-10-11

## 2018-09-10 NOTE — Patient Instructions (Addendum)
  Shoulder Blade Squeeze    Rotate shoulders back, then squeeze shoulder blades down and back Hold 10 sec Repeat _10___ times. Do __3-4__ sessions per day.  Upper Back Strength: Lower Trapezius / Rotator Cuff " L's "     Arms in waitress pose, palms up. Press hands back and slide shoulder blades down. Hold for __5__ seconds. Repeat _10___ times. 1-2 times per day.    Scapular Retraction: Elbow Flexion (Standing)  "W's"     With elbows bent to 90, pinch shoulder blades together and rotate arms out, keeping elbows bent. Repeat __10__ times per set. Do __1-2__ sets per session. Do _several ___ sessions per day.  Resisted External Rotation: in Neutral - Bilateral   PALMS UP Sit or stand, tubing in both hands, elbows at sides, bent to 90, forearms forward. Pinch shoulder blades together and rotate forearms out. Keep elbows at sides. Repeat __10__ times per set. Do _2-3___ sets per session. Do _2-3___ sessions per day.   Low Row: Standing   Face anchor, feet shoulder width apart. Palms up, pull arms back, squeezing shoulder blades together. Repeat 10__ times per set. Do 2-3__ sets per session. Do 1 time/day Anchor Height: Waist   Strengthening: Resisted Extension   Hold tubing in right hand, arm forward. Pull arm back, elbow straight. Repeat _10___ times per set. Do 2-3____ sets per session. Do 1___ sessions per day.   Scapula Adduction With Pectoralis Stretch: Low - Standing   Shoulders at 45 hands even with shoulders, keeping weight through legs, shift weight forward until you feel pull or stretch through the front of your chest. Hold _30__ seconds. Do _3__ times, _2-4__ times per day.   Scapula Adduction With Pectoralis Stretch: Mid-Range - Standing   Shoulders at 90 elbows even with shoulders, keeping weight through legs, shift weight forward until you feel pull or strength through the front of your chest. Hold __30_ seconds. Do _3__ times, __2-4_ times per  day.   Scapula Adduction With Pectoralis Stretch: High - Standing   Shoulders at 120 hands up high on the doorway, keeping weight on feet, shift weight forward until you feel pull or stretch through the front of your chest. Hold _30__ seconds. Do _3__ times, _2-3__ times per day.  Balance: Unilateral - Forward Lean    Stand on left foot, hands on hips. Keeping hips level, bend forward as if to touch forehead to wall. Hold __3-5__ seconds. Relax. Repeat __5-10__ times per set. Do _1-2___ sets per session. Do __1__ sessions per day.  Stand on one foot and tap forward/side/back with opposite foot  Switch legs  5-10 reps    [facility] [place], [place] [address]  [phone] (office) [phone] (fax)

## 2018-09-14 ENCOUNTER — Encounter: Payer: Self-pay | Admitting: Rehabilitative and Restorative Service Providers"

## 2018-09-14 ENCOUNTER — Ambulatory Visit (INDEPENDENT_AMBULATORY_CARE_PROVIDER_SITE_OTHER): Payer: Medicare Other | Admitting: Rehabilitative and Restorative Service Providers"

## 2018-09-14 DIAGNOSIS — M5416 Radiculopathy, lumbar region: Secondary | ICD-10-CM

## 2018-09-14 DIAGNOSIS — M25511 Pain in right shoulder: Secondary | ICD-10-CM

## 2018-09-14 DIAGNOSIS — R29898 Other symptoms and signs involving the musculoskeletal system: Secondary | ICD-10-CM

## 2018-09-14 DIAGNOSIS — M6281 Muscle weakness (generalized): Secondary | ICD-10-CM | POA: Diagnosis not present

## 2018-09-14 NOTE — Patient Instructions (Addendum)
Strengthening: Hip Abduction - Resisted    Stand tall, core engaged  extend leg out from side leading with heel Repeat __10__ times per set. Do _2-3___ sets per session. Do __1__ sessions per day.    Strengthening: Hip Extension - Resisted    Stand tall; core engaged pull leg straight back. Repeat __10__ times per set. Do _2-3__ sets per session. Do _1___ sessions per day.  Standing on one foot on cushion 30 sec x 3 each foot   Standing in doorway - swinging leg forward and back    Row: Mid-Range - Standing    With green band anchored at chest level, pull elbows backward, squeezing shoulder bladesdown and back Keep head and spine neutral. Row _10__ times, __1_ times per day.   Bow and arrow - as above    Rolling ball overhead - or towel  Keeping shoulder blades down and back   Reverse wall push up  10 reps x 2 sets 1x/day

## 2018-09-14 NOTE — Therapy (Signed)
[place], [place], [address] Phone: [phone]   Fax:  [phone]  Physical Therapy Treatment  Patient Details  Name: Daniel Cruz MRN: [id number] Date of Birth: 03/27/1951 Referring Provider (PT): Dr Legrand Como Hilts / Kathlene November, MD   Encounter Date: 09/14/2018  PT End of Session - 09/14/18 0852    Visit Number  5    Number of Visits  12    Date for PT Re-Evaluation  10/08/18    PT Start Time  4967    PT Stop Time  0930    PT Time Calculation (min)  43 min    Activity Tolerance  Patient tolerated treatment well       Past Medical History:  Diagnosis Date  . Anemia, iron deficiency    hx  . CAD (coronary artery disease)     NSTEMI with PCI of the mid and distal RCA with 50-60% LAD 09/2000, s/p cath 01/2009 with patent RCA stent and 90% LAD s/p PCI of LAD and right PDA  . Cholelithiasis   . DIABETES MELLITUS, TYPE II 02/26/2007  . Fatty liver 02/2009   per u/s  . GERD (gastroesophageal reflux disease)   . HTN (hypertension)   . OSA (obstructive sleep apnea)    can't tol. a CPAP  . Plantar fasciitis of left foot   . PVC (premature ventricular contraction) 06/06/2017  . UTI (lower urinary tract infection)    in the 80s and 11/09    Past Surgical History:  Procedure Laterality Date  . CARDIAC CATHETERIZATION  10/2008   stents  . CHOLECYSTECTOMY N/A 07/16/2016   Procedure: LAPAROSCOPIC CHOLECYSTECTOMY WITH INTRAOPERATIVE CHOLANGIOGRAM;  Surgeon: [name], MD;  Location: [facility];  Service: General;  Laterality: N/A;  . SEPTOPLASTY     w/ bilateral inferior turbinate reductions  . TONSILLECTOMY      There were no vitals filed for this visit.  Subjective Assessment - 09/14/18 0854    Subjective  Shoulder is feeling better - no pain at rest. Notices pain in the Rt shoulder with lower level doorway strength. Working on his exercises at home. Balance activities are the most  challenging. No longer taking medication.     Pain Score  0   some pain with doorway stretch                       [facility] Adult PT Treatment/Exercise - 09/14/18 0001      Lumbar Exercises: Stretches   Hip Flexor Stretch  Right;Left;2 reps;30 seconds   seated    Quad Stretch  Right;Left;2 reps   high kneeling - pt preference     Lumbar Exercises: Aerobic   Nustep  L6 x 8 min U/LE's (arms 11)      Lumbar Exercises: Standing   Heel Raises  20 reps    Other Standing Lumbar Exercises  alternate touch to 12 inch step x 20; SLS x 20-30 sec x 3 each LE; SLS blue cushion 20-30 sec x 2 each LE UE support as needed; standing in doorway for LE swing - UE support as needed 20-30 swings x 2 sets each LE       Knee/Hip Exercises: Standing   Hip Abduction  AROM;Stengthening;Right;Left;20 reps;Knee straight   core tight    Hip Extension  AROM;Stengthening;Right;Left;20 reps;Knee straight   core engaged    SLS  20-30 sec x 3 reps; blue theracushion each LE x 2 reps -  UE support as needed       Shoulder Exercises: Standing   External Rotation  Right;10 reps;Theraband    Theraband Level (Shoulder External Rotation)  Level 3 (Green)    Internal Rotation  Right;10 reps;Theraband    Theraband Level (Shoulder Internal Rotation)  Level 3 (Green)    Extension  Strengthening;Both;10 reps;Theraband    Theraband Level (Shoulder Extension)  Level 3 (Green)    Row  Both;10 reps;Theraband   5 sec hold   Theraband Level (Shoulder Row)  Level 3 (Green)    Row Limitations  added row at 45 deg and bow and arrow row x 20 each - green TB     Other Standing Exercises  rolling ball overhead x 3 reps ~ 1 min each - focus on position of shoulder girdle       Shoulder Exercises: Stretch   Other Shoulder Stretches  3 way doorway stretch 30 sec x 2 reps each position              PT Education - 09/14/18 0935    Education Details  HEP     Person(s) Educated  Patient    Methods   Explanation;Demonstration;Tactile cues;Verbal cues;Handout    Comprehension  Verbalized understanding;Verbal cues required;Returned demonstration;Tactile cues required          PT Long Term Goals - 09/08/18 1038      PT LONG TERM GOAL #1   Title  Increased Lt LE strength by 1/2 to 1 muscle grade 10/08/18    Time  6    Period  Weeks    Status  New      PT LONG TERM GOAL #2   Title  Patient to move sit to stand x 10 without limitation/fatigue 10/08/18    Time  6    Period  Weeks    Status  New      PT LONG TERM GOAL #3   Title  Patient reports and demonstrates good body mechanics with transfers and transitional movements 10/08/18    Time  6    Period  Weeks    Status  New      PT LONG TERM GOAL #4   Title  Independent in HEP 10/08/18    Time  6    Period  Weeks    Status  New      PT LONG TERM GOAL #5   Title  Improve FOTO to </= 40% limitation 10/08/18    Time  6    Period  Weeks    Status  New      PT LONG TERM GOAL #6   Title  report Rt shoulder pain < 4/10 with activity for decreased pain and improved function    Status  New    Target Date  10/20/18      PT LONG TERM GOAL #7   Title  demonstrate Rt shoulder strength at least 4/5 for improved function and mobiltiy    Status  New    Target Date  10/20/18      PT LONG TERM GOAL #8   Title  perform Rt shoulder ROM without pain for improved function and decreased pain with ADLs    Status  New    Target Date  10/20/18            Plan - 09/14/18 0853    Clinical Impression Statement  Improving - less pain in the Rt shoulder. Lt LE is getting stronger. Improving - progressing  well toward stated goals of therapy. Added exercises without dififculty.     Rehab Potential  Good    PT Frequency  2x / week    PT Duration  6 weeks    PT Treatment/Interventions  Patient/family education;ADLs/Self Care Home Management;Cryotherapy;Electrical Stimulation;Iontophoresis 4mg /ml Dexamethasone;Moist Heat;Ultrasound;Dry  needling;Manual techniques;Neuromuscular re-education;Therapeutic activities;Therapeutic exercise;Gait training;Balance training;Vasopneumatic Device;Taping    PT Next Visit Plan  review HEP progress with core stabilization and LE strengthening; review shoulder HEP and progress PRN, manual/modalities PRN    PT Home Exercise Plan  Access Code: [email or web address]     Consulted and Agree with Plan of Care  Patient       Patient will benefit from skilled therapeutic intervention in order to improve the following deficits and impairments:  Postural dysfunction, Improper body mechanics, Increased fascial restricitons, Decreased mobility, Decreased range of motion, Decreased strength, Decreased activity tolerance, Decreased balance  Visit Diagnosis: Acute pain of right shoulder  Muscle weakness (generalized)  Other symptoms and signs involving the musculoskeletal system  Radiculopathy, lumbar region     Problem List Patient Active Problem List   Diagnosis Date Noted  . PVC (premature ventricular contraction) 06/06/2017  . PCP NOTES >>>>>>>>>>>>>>>>>>>>>>>>> 11/20/2015  . Anemia 05/08/2015  . GERD, GB polyps  07/29/2013  . Annual physical exam 05/15/2011  . Pain in joint, ankle and foot 03/19/2010  . ERECTILE DYSFUNCTION 11/08/2008  . Hyperlipidemia 07/20/2007  . DM (diabetes mellitus) with complications ([facility]) 70/26/3785  . HTN (hypertension) 02/26/2007  . CAD (coronary artery disease) 02/26/2007  . GERD 02/26/2007  . SLEEP APNEA 02/26/2007    Kaileah Shevchenko Nilda Simmer PT, MPH  09/14/2018, 9:50 AM  [facility] [place], [place], [address] Phone: [phone]   Fax:  [phone]  Name: Daniel Cruz MRN: [id number] Date of Birth: 1951/05/22

## 2018-09-16 ENCOUNTER — Encounter: Payer: Self-pay | Admitting: Internal Medicine

## 2018-09-17 ENCOUNTER — Ambulatory Visit (INDEPENDENT_AMBULATORY_CARE_PROVIDER_SITE_OTHER): Payer: Medicare Other | Admitting: Physical Therapy

## 2018-09-17 ENCOUNTER — Encounter: Payer: Self-pay | Admitting: Physical Therapy

## 2018-09-17 DIAGNOSIS — R29898 Other symptoms and signs involving the musculoskeletal system: Secondary | ICD-10-CM | POA: Diagnosis not present

## 2018-09-17 DIAGNOSIS — M25511 Pain in right shoulder: Secondary | ICD-10-CM | POA: Diagnosis not present

## 2018-09-17 DIAGNOSIS — M6281 Muscle weakness (generalized): Secondary | ICD-10-CM | POA: Diagnosis not present

## 2018-09-17 DIAGNOSIS — M5416 Radiculopathy, lumbar region: Secondary | ICD-10-CM | POA: Diagnosis not present

## 2018-09-17 NOTE — Patient Instructions (Signed)
Resisted External Rotation: in Neutral - Bilateral  PALMS UP!!! Sit or stand, tubing in both hands, elbows at sides, bent to 90, forearms forward. Pinch shoulder blades together and rotate forearms out. Keep elbows at sides. Repeat __10__ times per set. Do __2-3__ sets per session. Do __3__ sessions per week.  Resistive Band Rowing   With resistive band anchored in door, grasp both ends. Keeping elbows bent, pull back, squeezing shoulder blades together. Hold _3-5___ seconds. Repeat _10-30___ times. Do __3 sessions per week.    Strengthening: Resisted Extension   Hold tubing with both hands, arms forward. Pull arms back, elbow straight. Repeat _10-30___ times per set. Do ____ sets per session. Do _3 sessions per week.    *tricep stretch on door. X 3 reps   * self massage with ball to Right pec and back of shoulder blade 3-5 min    *stretch every day * band exercises every other day.

## 2018-09-17 NOTE — Therapy (Addendum)
[facility] [place] [facility], [place], [address] Phone: [phone]   Fax:  [phone]  Physical Therapy Treatment  Patient Details  Name: RAYLEE ADAMEC MRN: [id number] Date of Birth: 11-10-1950 Referring Provider (PT): Dr Legrand Como Hilts / Kathlene November, MD   Encounter Date: 09/17/2018  PT End of Session - 09/17/18 0857    Visit Number  6    Number of Visits  12    Date for PT Re-Evaluation  10/08/18    PT Start Time  0848    PT Stop Time  0932    PT Time Calculation (min)  44 min       Past Medical History:  Diagnosis Date  . Anemia, iron deficiency    hx  . CAD (coronary artery disease)     NSTEMI with PCI of the mid and distal RCA with 50-60% LAD 09/2000, s/p cath 01/2009 with patent RCA stent and 90% LAD s/p PCI of LAD and right PDA  . Cholelithiasis   . DIABETES MELLITUS, TYPE II 02/26/2007  . Fatty liver 02/2009   per u/s  . GERD (gastroesophageal reflux disease)   . HTN (hypertension)   . OSA (obstructive sleep apnea)    can't tol. a CPAP  . Plantar fasciitis of left foot   . PVC (premature ventricular contraction) 06/06/2017  . UTI (lower urinary tract infection)    in the 80s and 11/09    Past Surgical History:  Procedure Laterality Date  . CARDIAC CATHETERIZATION  10/2008   stents  . CHOLECYSTECTOMY N/A 07/16/2016   Procedure: LAPAROSCOPIC CHOLECYSTECTOMY WITH INTRAOPERATIVE CHOLANGIOGRAM;  Surgeon: [name], MD;  Location: [facility];  Service: General;  Laterality: N/A;  . SEPTOPLASTY     w/ bilateral inferior turbinate reductions  . TONSILLECTOMY      There were no vitals filed for this visit.  Subjective Assessment - 09/17/18 0853    Subjective  Pt reports his legs are going really well; he would like to focus on his  Rt shoulder.  He is traveling out of town for a few weeks and would like some exercises to work on while he is gone.  He would like his pain in shoulder/arm to be 0/10.     Patient Stated Goals  get Lt LE stronger and return to golf and regular activities; improve pain and be pain free with Rt shoulder     Currently in Pain?  Yes    Pain Location  Arm   tricep area   Pain Orientation  Right    Aggravating Factors   first thing in morning (sleep position)     Pain Relieving Factors  letting his arm straight down, stretches          [facility] PT Assessment - 09/17/18 0001      Assessment   Medical Diagnosis  Lumbar radiculopathy; Lt LE weakness; Rt shoulder stiffness    Referring Provider (PT)  Dr Legrand Como Hilts / Kathlene November, MD    Onset Date/Surgical Date  --   3 months ago   Hand Dominance  Left    Next MD Visit  PRN    Prior Therapy  chiropractic care        [facility] Adult PT Treatment/Exercise - 09/17/18 0001      Self-Care   Other Self-Care Comments   pt re-educated on self massage with ball to Rt posterior shoulder, as well as pec and tricep; pt verbalized understanding.  Lumbar Exercises: Stretches   Other Lumbar Stretch Exercise  lat stretch with hands on back of truck x 20 sec x 2 reps      Lumbar Exercises: Aerobic   Nustep  L6 x 5 min U/LE's (arms 11)      Shoulder Exercises: Standing   External Rotation  Right;10 reps;Theraband    Theraband Level (Shoulder External Rotation)  Level 3 (Green)    Extension  Strengthening;Both;Theraband;15 reps    Theraband Level (Shoulder Extension)  Level 3 (Green)    Row  Both;Theraband;12 reps    Theraband Level (Shoulder Row)  Level 4 (Blue)   pause in ext x 3 sec     Shoulder Exercises: Stretch   Other Shoulder Stretches  3 way doorway stretch 30 sec x 1 rep each position;  tricep stretch on Rt x 2 reps of 30 sec, 1 rep on LUE.   shoulder flexion stretch arms overhead x 15 sec x 2      Manual Therapy   Manual Therapy  Soft tissue mobilization    Manual therapy comments  pt sitting for posterior /anterior shoulder IASTM, supine with arm flexed ~120 deg for tricep STM    Soft tissue mobilization   IASTM with Edge tool to Rt shoulder (ant/ post, including pec) to decrease fascial restrictions and improve ROM.  STM to Rt tricep with arm in flexed position.              PT Education - 09/17/18 1305    Education Details  HEP    Person(s) Educated  Patient    Methods  Explanation;Demonstration;Handout    Comprehension  Verbalized understanding;Returned demonstration          PT Long Term Goals - 09/08/18 1038      PT LONG TERM GOAL #1   Title  Increased Lt LE strength by 1/2 to 1 muscle grade 10/08/18    Time  6    Period  Weeks    Status  New      PT LONG TERM GOAL #2   Title  Patient to move sit to stand x 10 without limitation/fatigue 10/08/18    Time  6    Period  Weeks    Status  New      PT LONG TERM GOAL #3   Title  Patient reports and demonstrates good body mechanics with transfers and transitional movements 10/08/18    Time  6    Period  Weeks    Status  New      PT LONG TERM GOAL #4   Title  Independent in HEP 10/08/18    Time  6    Period  Weeks    Status  New      PT LONG TERM GOAL #5   Title  Improve FOTO to </= 40% limitation 10/08/18    Time  6    Period  Weeks    Status  New      PT LONG TERM GOAL #6   Title  report Rt shoulder pain < 4/10 with activity for decreased pain and improved function    Status  New    Target Date  10/20/18      PT LONG TERM GOAL #7   Title  demonstrate Rt shoulder strength at least 4/5 for improved function and mobiltiy    Status  New    Target Date  10/20/18      PT LONG TERM GOAL #8   Title  perform Rt shoulder ROM without pain for improved function and decreased pain with ADLs    Status  New    Target Date  10/20/18            Plan - 09/17/18 1258    Clinical Impression Statement  Pt reported relief of Rt posterior shoulder pain with added tricep stretch.  Palpable tightness and tenderness in Rt infraspinatus, teres minor, as well as Rt pec, anterior deltoid; improved with manual therapy to area.  Pt  tolerated all exercises well reporting reduction of pain/tightness by end of session.  Pt is progressing towards goals with improved Rt shoulder ROM with less reported pain.      Rehab Potential  Good    PT Frequency  2x / week    PT Duration  6 weeks    PT Treatment/Interventions  Patient/family education;ADLs/Self Care Home Management;Cryotherapy;Electrical Stimulation;Iontophoresis '4mg'$ /ml Dexamethasone;Moist Heat;Ultrasound;Dry needling;Manual techniques;Neuromuscular re-education;Therapeutic activities;Therapeutic exercise;Gait training;Balance training;Vasopneumatic Device;Taping    PT Next Visit Plan  Pt will be out of town for 2 wks during holiday.  will call to schedule upon his return.  Assess goals next visit.      PT Home Exercise Plan  Access Code: [email or web address]     Consulted and Agree with Plan of Care  Patient       Patient will benefit from skilled therapeutic intervention in order to improve the following deficits and impairments:  Postural dysfunction, Improper body mechanics, Increased fascial restricitons, Decreased mobility, Decreased range of motion, Decreased strength, Decreased activity tolerance, Decreased balance  Visit Diagnosis: Acute pain of right shoulder  Muscle weakness (generalized)  Other symptoms and signs involving the musculoskeletal system  Radiculopathy, lumbar region     Problem List Patient Active Problem List   Diagnosis Date Noted  . PVC (premature ventricular contraction) 06/06/2017  . PCP NOTES >>>>>>>>>>>>>>>>>>>>>>>>> 11/20/2015  . Anemia 05/08/2015  . GERD, GB polyps  07/29/2013  . Annual physical exam 05/15/2011  . Pain in joint, ankle and foot 03/19/2010  . ERECTILE DYSFUNCTION 11/08/2008  . Hyperlipidemia 07/20/2007  . DM (diabetes mellitus) with complications ([place]) 38/75/6433  . HTN (hypertension) 02/26/2007  . CAD (coronary artery disease) 02/26/2007  . GERD 02/26/2007  . SLEEP APNEA 02/26/2007   Kerin Perna,  PTA 09/17/18 1:05 PM  [facility] [place], [place], [address] Phone: [phone]   Fax:  [phone]  Name: GRAYSON PFEFFERLE MRN: [id number] Date of Birth: 04-30-1951  PHYSICAL THERAPY DISCHARGE SUMMARY  Visits from Start of Care: 6  Current functional level related to goals / functional outcomes: See last progress note for discharge status    Remaining deficits: No known deficits    Education / Equipment: HEP  Plan: Patient agrees to discharge.  Patient goals were met. Patient is being discharged due to meeting the stated rehab goals.  ?????    Celyn P. Helene Kelp PT, MPH 10/14/18 2:29 PM

## 2018-09-21 ENCOUNTER — Encounter: Payer: Medicare Other | Admitting: Rehabilitative and Restorative Service Providers"

## 2018-09-24 ENCOUNTER — Encounter: Payer: Medicare Other | Admitting: Rehabilitative and Restorative Service Providers"

## 2018-10-17 ENCOUNTER — Other Ambulatory Visit: Payer: Self-pay | Admitting: Internal Medicine

## 2018-10-18 ENCOUNTER — Other Ambulatory Visit: Payer: Self-pay | Admitting: Internal Medicine

## 2018-10-20 ENCOUNTER — Ambulatory Visit: Payer: Medicare Other | Admitting: Internal Medicine

## 2018-10-27 ENCOUNTER — Ambulatory Visit (INDEPENDENT_AMBULATORY_CARE_PROVIDER_SITE_OTHER): Payer: Medicare Other | Admitting: Family Medicine

## 2018-10-27 ENCOUNTER — Encounter (INDEPENDENT_AMBULATORY_CARE_PROVIDER_SITE_OTHER): Payer: Self-pay | Admitting: Family Medicine

## 2018-10-27 DIAGNOSIS — G8929 Other chronic pain: Secondary | ICD-10-CM | POA: Diagnosis not present

## 2018-10-27 DIAGNOSIS — M25511 Pain in right shoulder: Secondary | ICD-10-CM | POA: Diagnosis not present

## 2018-10-27 NOTE — Progress Notes (Signed)
Office Visit Note   Patient: Daniel Cruz           Date of Birth: Mar 01, 1951           MRN: [id number] Visit Date: 10/27/2018 Requested by: Colon Branch, [address] [place], [place] [address] PCP: Colon Branch, MD  Subjective: Chief Complaint  Patient presents with  . Right Upper Arm - Pain    DOI 10/24/18 - lifted a 40 lb box, felt a pop in the shoulder, and then a "searing pain" across the bicep area.  Bruising in that area now. No shoulder pain. Continues to feel that pain with any lifting.    HPI: He is here with right shoulder pain.  Incidentally his left leg strength has returned completely, he feels great from that standpoint.  He started doing some physical therapy for his ongoing right shoulder pain and was doing pretty well with home exercises although he was still having a deep aching pain in his shoulder.  Then a couple days ago he was lifting a box and he felt a pop in his shoulder and immediate pain down into his biceps.  Now he has a bruise.  His previous deep aching pain has completely resolved.              ROS: Otherwise noncontributory  Objective: Vital Signs: There were no vitals taken for this visit.  Physical Exam:  Right shoulder: Bruising in the biceps area with a Popeye deformity.  Tender along the bicipital groove.  No tenderness at the [facility] joint, rotator cuff strength is 5/5 throughout with minimal pain.  Imaging: Musculoskeletal ultrasound: I imaged his long head biceps tendon but did not bill for the procedure or record images.  His tendon is torn and retracted.  Assessment & Plan: 1.  Right long head biceps rupture -Discussed the diagnosis and prognosis with patient.  He should in the next few weeks have relief of pain, then he will need to work on biceps strengthening.  Anticipate that he will regain about 90% of his previous strength.  He is comfortable with this and I will see him back as needed.   Follow-Up Instructions: No  follow-ups on file.      Procedures: No procedures performed  No notes on file    PMFS History: Patient Active Problem List   Diagnosis Date Noted  . PVC (premature ventricular contraction) 06/06/2017  . PCP NOTES >>>>>>>>>>>>>>>>>>>>>>>>> 11/20/2015  . Anemia 05/08/2015  . GERD, GB polyps  07/29/2013  . Annual physical exam 05/15/2011  . Pain in joint, ankle and foot 03/19/2010  . ERECTILE DYSFUNCTION 11/08/2008  . Hyperlipidemia 07/20/2007  . DM (diabetes mellitus) with complications ([place]) 42/70/6237  . HTN (hypertension) 02/26/2007  . CAD (coronary artery disease) 02/26/2007  . GERD 02/26/2007  . SLEEP APNEA 02/26/2007   Past Medical History:  Diagnosis Date  . Anemia, iron deficiency    hx  . CAD (coronary artery disease)     NSTEMI with PCI of the mid and distal RCA with 50-60% LAD 09/2000, s/p cath 01/2009 with patent RCA stent and 90% LAD s/p PCI of LAD and right PDA  . Cholelithiasis   . DIABETES MELLITUS, TYPE II 02/26/2007  . Fatty liver 02/2009   per u/s  . GERD (gastroesophageal reflux disease)   . HTN (hypertension)   . OSA (obstructive sleep apnea)    can't tol. a CPAP  . Plantar fasciitis of left foot   .  PVC (premature ventricular contraction) 06/06/2017  . UTI (lower urinary tract infection)    in the 80s and 11/09    Family History  Problem Relation Age of Onset  . Emphysema Mother        died  . COPD Mother   . Stroke Father   . Coronary artery disease Father   . Cancer Neg Hx        colon ,prostate  . Diabetes Neg Hx     Past Surgical History:  Procedure Laterality Date  . CARDIAC CATHETERIZATION  10/2008   stents  . CHOLECYSTECTOMY N/A 07/16/2016   Procedure: LAPAROSCOPIC CHOLECYSTECTOMY WITH INTRAOPERATIVE CHOLANGIOGRAM;  Surgeon: [name], MD;  Location: [facility];  Service: General;  Laterality: N/A;  . SEPTOPLASTY     w/ bilateral inferior turbinate reductions  . TONSILLECTOMY     Social History   Occupational History  .  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 Smoking status: Former Smoker    Packs/day: 1.50    Years: 16.00    Pack years: 24.00    Last attempt to quit: 07/08/2000    Years since quitting: 18.3  . Smokeless tobacco: Never Used  . Tobacco comment: 2001  Substance and Sexual Activity  . Alcohol use: Yes    Comment: socially  . Drug use: No  . Sexual activity: Yes

## 2018-11-02 ENCOUNTER — Other Ambulatory Visit (INDEPENDENT_AMBULATORY_CARE_PROVIDER_SITE_OTHER): Payer: Self-pay | Admitting: Family Medicine

## 2018-11-02 DIAGNOSIS — S46211A Strain of muscle, fascia and tendon of other parts of biceps, right arm, initial encounter: Secondary | ICD-10-CM

## 2018-11-04 ENCOUNTER — Encounter: Payer: Self-pay | Admitting: Rehabilitative and Restorative Service Providers"

## 2018-11-04 ENCOUNTER — Ambulatory Visit (INDEPENDENT_AMBULATORY_CARE_PROVIDER_SITE_OTHER): Payer: Medicare Other | Admitting: Rehabilitative and Restorative Service Providers"

## 2018-11-04 ENCOUNTER — Other Ambulatory Visit: Payer: Self-pay

## 2018-11-04 DIAGNOSIS — M25511 Pain in right shoulder: Secondary | ICD-10-CM

## 2018-11-04 DIAGNOSIS — M79621 Pain in right upper arm: Secondary | ICD-10-CM

## 2018-11-04 DIAGNOSIS — R29898 Other symptoms and signs involving the musculoskeletal system: Secondary | ICD-10-CM

## 2018-11-04 DIAGNOSIS — M6281 Muscle weakness (generalized): Secondary | ICD-10-CM

## 2018-11-04 NOTE — Patient Instructions (Signed)
Access Code: [email or web address]  URL: [email or web address]  Date: 11/04/2018  Prepared by: Gillermo Murdoch   Exercises  Bicep Stretch at Table - 3 reps - 1 sets - 30 sec hold - 2x daily - 7x weekly  Seated Bicep Curls Supinated with Dumbbells - 10 reps - 1 sets - 2 sec hold - 2x daily - 7x weekly  Seated Bicep Curls Neutral with Dumbbells - 10 reps - 1 sets - 2 sec hold - 2x daily - 7x weekly  Wrist Flexion with Dumbbell - 10 reps - 1 sets - 2sec hold - 1x daily - 7x weekly  Wrist Extension with Dumbbell - 10 reps - 1 sets - 2 sec hold - 1x daily - 7x weekly  Forearm Supination with Dumbbell - 10 reps - 1 sets - 2sec hold - 1x daily - 7x weekly  Forearm Pronation with Dumbbell - 10 reps - 1 sets - 2 sec hold - 1x daily - 7x weekly

## 2018-11-04 NOTE — Therapy (Addendum)
[facility] [address] [place], [place], [address] Phone: [phone]   Fax:  [phone]  Physical Therapy Evaluation  Patient Details  Name: Daniel Cruz MRN: [id number] Date of Birth: 07/23/1951 Referring Provider (PT): Dr Michael Hilts    Encounter Date: 11/04/2018  PT End of Session - 11/04/18 0953    Visit Number  1    Number of Visits  4    Date for PT Re-Evaluation  12/02/18    PT Start Time  0847    PT Stop Time  0942    PT Time Calculation (min)  55 min    Activity Tolerance  Patient tolerated treatment well       Past Medical History:  Diagnosis Date  . Anemia, iron deficiency    hx  . CAD (coronary artery disease)     NSTEMI with PCI of the mid and distal RCA with 50-60% LAD 09/2000, s/p cath 01/2009 with patent RCA stent and 90% LAD s/p PCI of LAD and right PDA  . Cholelithiasis   . DIABETES MELLITUS, TYPE II 02/26/2007  . Fatty liver 02/2009   per u/s  . GERD (gastroesophageal reflux disease)   . HTN (hypertension)   . OSA (obstructive sleep apnea)    can't tol. a CPAP  . Plantar fasciitis of left foot   . PVC (premature ventricular contraction) 06/06/2017  . UTI (lower urinary tract infection)    in the 80s and 11/09    Past Surgical History:  Procedure Laterality Date  . CARDIAC CATHETERIZATION  10/2008   stents  . CHOLECYSTECTOMY N/A 07/16/2016   Procedure: LAPAROSCOPIC CHOLECYSTECTOMY WITH INTRAOPERATIVE CHOLANGIOGRAM;  Surgeon: Thomas Cornett, MD;  [facility];  Service: General;  Laterality: N/A;  . SEPTOPLASTY     w/ bilateral inferior turbinate reductions  . TONSILLECTOMY      There were no vitals filed for this visit.   Subjective Assessment - 11/04/18 0854    Subjective  Patient reports that he was lifting about 30 pounds in an awkward position when he felt a "pop" and felt severe pain 10/24/2018    Pertinent History  Arthritis; AODM; HTN; MI ~ 2001; gall bladder  surgery 2018; sleep apnea; episode of sciatica 5-6 yrs ago resolved with chiropractic care in ~ 2 months - Rt shoulder pain     Diagnostic tests  MRI - degenerative changes lumbar spin - spinal stenosis     Patient Stated Goals  learn some exercise for home     Currently in Pain?  No/denies    Pain Score  4     Pain Location  Arm    Pain Orientation  Right    Pain Descriptors / Indicators  Tightness;Cramping    Pain Type  Acute pain    Pain Onset  1 to 4 weeks ago    Pain Frequency  Intermittent    Aggravating Factors   lifting     Pain Relieving Factors  shaking arm          [facility] PT Assessment - 11/04/18 0001      Assessment   Medical Diagnosis  Rt biceps tendon rupture     Referring Provider (PT)  Dr Michael Hilts     Onset Date/Surgical Date  10/24/18    Hand Dominance  Left    Next MD Visit  PRN    Prior Therapy  chiropractic care and here for lumbar radiculopathy       Observation/Other   Assessments   Focus on Therapeutic Outcomes (FOTO)   35% limitation       Sensation   Additional Comments  WFL's per pt report       Posture/Postural Control   Posture Comments  head forward; shoulders rounded and elevated; flexed forward at hips       AROM   Right Shoulder Extension  42 Degrees    Right Shoulder Flexion  154 Degrees    Right Shoulder ABduction  155 Degrees    Right Shoulder Internal Rotation  25 Degrees    Right Shoulder External Rotation  90 Degrees    Left Shoulder Extension  45 Degrees    Left Shoulder Flexion  160 Degrees    Left Shoulder ABduction  158 Degrees    Left Shoulder Internal Rotation  30 Degrees    Left Shoulder External Rotation  94 Degrees    Right/Left Elbow  --   WNL's and equal bilat      Strength   Right Elbow Flexion  4/5   discomfort with resistive testing    Right Elbow Extension  5/5    Left Elbow Flexion  5/5    Left Elbow Extension  5/5    Right Forearm Pronation  5/5    Right Forearm Supination  4+/5    Left Forearm  Pronation  5/5    Left Forearm Supination  5/5    Right/Left Wrist  --   5/5 bilat      Palpation   Palpation comment  palpable tightness Rt biceps area - notable biceps bulge                Objective measurements completed on examination: See above findings.      [facility] Adult PT Treatment/Exercise - 11/04/18 0001      Elbow Exercises   Elbow Flexion  Strengthening;Right;10 reps;Seated   with forearm supinated; repeated with wrist/forearm neutral   Theraband Level (Elbow Flexion)  Level 2 (Red)    Bar Weights/Barbell (Elbow Flexion)  2 lbs    Forearm Supination  Strengthening;Right;10 reps;Seated;Bar weights/barbell    Bar Weights/Barbell (Forearm Supination)  2 lbs    Forearm Pronation  Strengthening;Right;10 reps;Seated;Bar weights/barbell    Bar Weights/Barbell (Forearm Pronation)  2 lbs    Other elbow exercises  wrist flexion and extension 2 # wt x 10 each     Other elbow exercises  biceps stretch in sitting and in standing 30 sec x 3 reps              PT Education - 11/04/18 1000    Education Details  HEP     Person(s) Educated  Patient    Methods  Explanation;Demonstration;Tactile cues;Verbal cues;Handout    Comprehension  Verbalized understanding;Returned demonstration;Verbal cues required;Tactile cues required       PT Short Term Goals - 11/04/18 0957      PT SHORT TERM GOAL #1   Title  Instruct patient in appropriated HEP for shoulder; elbow; forearm following Rt biceps tendon rupture     Time  1    Period  Days    Status  Achieved    Target Date  11/04/18      PT SHORT TERM GOAL #2   Title  Further assessment and treatment as indicated - patient will call to schedule as needed     Time  4    Period  Weeks    Status  New    Target Date  12/04/18  PT Long Term Goals - 09/08/18 1038      PT LONG TERM GOAL #1   Title  Increased Lt LE strength by 1/2 to 1 muscle grade 10/08/18    Time  6    Period  Weeks    Status  New      PT  LONG TERM GOAL #2   Title  Patient to move sit to stand x 10 without limitation/fatigue 10/08/18    Time  6    Period  Weeks    Status  New      PT LONG TERM GOAL #3   Title  Patient reports and demonstrates good body mechanics with transfers and transitional movements 10/08/18    Time  6    Period  Weeks    Status  New      PT LONG TERM GOAL #4   Title  Independent in HEP 10/08/18    Time  6    Period  Weeks    Status  New      PT LONG TERM GOAL #5   Title  Improve FOTO to </= 40% limitation 10/08/18    Time  6    Period  Weeks    Status  New      PT LONG TERM GOAL #6   Title  report Rt shoulder pain < 4/10 with activity for decreased pain and improved function    Status  New    Target Date  10/20/18      PT LONG TERM GOAL #7   Title  demonstrate Rt shoulder strength at least 4/5 for improved function and mobiltiy    Status  New    Target Date  10/20/18      PT LONG TERM GOAL #8   Title  perform Rt shoulder ROM without pain for improved function and decreased pain with ADLs    Status  New    Target Date  10/20/18             Plan - 11/04/18 0954    Clinical Impression Statement  Patient presents with Rt UE weakness and tightness following biceps tendon rupture 10/25/2018. He has decreased strength in Rt elbow flexion and forearm supinatioin. He will benefit from PT to address porblems and provide HEP.     Clinical Presentation  Stable    Clinical Decision Making  Low    Rehab Potential  Good    PT Frequency  1x / week    PT Duration  4 weeks    PT Treatment/Interventions  Patient/family education;ADLs/Self Care Home Management;Cryotherapy;Electrical Stimulation;Iontophoresis 84m/ml Dexamethasone;Moist Heat;Ultrasound;Dry needling;Manual techniques;Neuromuscular re-education;Therapeutic activities;Therapeutic exercise;Gait training;Balance training;Vasopneumatic Device;Taping    PT Next Visit Plan  patient will call to schedule additional treatment as indicated     PT  Home Exercise Plan  Access Code: [email or web address]  Access Code: [email or web address])    Consulted and Agree with Plan of Care  Patient       Patient will benefit from skilled therapeutic intervention in order to improve the following deficits and impairments:  Postural dysfunction, Improper body mechanics, Increased fascial restricitons, Decreased mobility, Decreased range of motion, Decreased strength, Decreased activity tolerance, Decreased balance  Visit Diagnosis: Pain in right upper arm - Plan: PT plan of care cert/re-cert  Muscle weakness (generalized) - Plan: PT plan of care cert/re-cert  Other symptoms and signs involving the musculoskeletal system - Plan: PT plan of care cert/re-cert  Acute pain of right shoulder - Plan: PT plan of  care cert/re-cert     Problem List Patient Active Problem List   Diagnosis Date Noted  . PVC (premature ventricular contraction) 06/06/2017  . PCP NOTES >>>>>>>>>>>>>>>>>>>>>>>>> 11/20/2015  . Anemia 05/08/2015  . GERD, GB polyps  07/29/2013  . Annual physical exam 05/15/2011  . Pain in joint, ankle and foot 03/19/2010  . ERECTILE DYSFUNCTION 11/08/2008  . Hyperlipidemia 07/20/2007  . DM (diabetes mellitus) with complications ([place]) 50/93/2671  . HTN (hypertension) 02/26/2007  . CAD (coronary artery disease) 02/26/2007  . GERD 02/26/2007  . SLEEP APNEA 02/26/2007    Daniel Cruz Nilda Simmer PT, MPH  11/04/2018, 10:01 AM  [facility] [place], [place], [address] Phone: [phone]   Fax:  [phone]  Name: Daniel Cruz MRN: [id number] Date of Birth: Aug 29, 1951  PHYSICAL THERAPY DISCHARGE SUMMARY  Visits from Start of Care: Evaluation and initial treatment only   Current functional level related to goals / functional outcomes: See evaluation for functional level   Remaining deficits: Unknown    Education / Equipment: HEP  Plan: Patient agrees to discharge.  Patient goals  were met. Patient is being discharged due to meeting the stated rehab goals.  ?????    Patient instructed in HEP and has not called to schedule additional appointments   Breckan Cafiero P. Helene Kelp PT, MPH 12/03/18 9:38 AM

## 2018-11-12 ENCOUNTER — Other Ambulatory Visit: Payer: Self-pay | Admitting: Internal Medicine

## 2018-11-22 ENCOUNTER — Other Ambulatory Visit: Payer: Self-pay | Admitting: Internal Medicine

## 2018-12-02 DIAGNOSIS — H25813 Combined forms of age-related cataract, bilateral: Secondary | ICD-10-CM | POA: Diagnosis not present

## 2018-12-02 DIAGNOSIS — H5201 Hypermetropia, right eye: Secondary | ICD-10-CM | POA: Diagnosis not present

## 2018-12-02 DIAGNOSIS — H5212 Myopia, left eye: Secondary | ICD-10-CM | POA: Diagnosis not present

## 2018-12-02 DIAGNOSIS — H52223 Regular astigmatism, bilateral: Secondary | ICD-10-CM | POA: Diagnosis not present

## 2018-12-11 ENCOUNTER — Encounter: Payer: Self-pay | Admitting: Internal Medicine

## 2018-12-11 ENCOUNTER — Ambulatory Visit (INDEPENDENT_AMBULATORY_CARE_PROVIDER_SITE_OTHER): Payer: Medicare Other | Admitting: Internal Medicine

## 2018-12-11 VITALS — BP 126/68 | HR 69 | Temp 97.9°F | Resp 16 | Ht 71.0 in | Wt 261.2 lb

## 2018-12-11 DIAGNOSIS — E118 Type 2 diabetes mellitus with unspecified complications: Secondary | ICD-10-CM

## 2018-12-11 DIAGNOSIS — I10 Essential (primary) hypertension: Secondary | ICD-10-CM

## 2018-12-11 DIAGNOSIS — E7800 Pure hypercholesterolemia, unspecified: Secondary | ICD-10-CM | POA: Diagnosis not present

## 2018-12-11 DIAGNOSIS — D649 Anemia, unspecified: Secondary | ICD-10-CM

## 2018-12-11 DIAGNOSIS — M4800 Spinal stenosis, site unspecified: Secondary | ICD-10-CM | POA: Diagnosis not present

## 2018-12-11 LAB — BASIC METABOLIC PANEL
BUN: 23 mg/dL (ref 6–23)
CALCIUM: 9.4 mg/dL (ref 8.4–10.5)
CO2: 28 mEq/L (ref 19–32)
Chloride: 100 mEq/L (ref 96–112)
Creatinine, Ser: 1.09 mg/dL (ref 0.40–1.50)
GFR: 66.87 mL/min (ref >60.00)
Glucose, Bld: 132 mg/dL — ABNORMAL HIGH (ref 70–99)
Potassium: 4.6 mEq/L (ref 3.5–5.1)
Sodium: 135 mEq/L (ref 135–145)

## 2018-12-11 LAB — HEMOGLOBIN A1C: Hgb A1c MFr Bld: 7.1 % — ABNORMAL HIGH (ref 4.6–6.5)

## 2018-12-11 LAB — CBC WITH DIFFERENTIAL/PLATELET
Basophils Absolute: 0 10*3/uL (ref 0.0–0.1)
Basophils Relative: 0.7 % (ref 0.0–3.0)
Eosinophils Absolute: 0.3 10*3/uL (ref 0.0–0.7)
Eosinophils Relative: 5.4 % — ABNORMAL HIGH (ref 0.0–5.0)
HCT: 39.8 % (ref 39.0–52.0)
Hemoglobin: 13.2 g/dL (ref 13.0–17.0)
LYMPHS ABS: 1.1 10*3/uL (ref 0.7–4.0)
Lymphocytes Relative: 19.9 % (ref 12.0–46.0)
MCHC: 33 g/dL (ref 30.0–36.0)
MCV: 94.4 fl (ref 78.0–100.0)
Monocytes Absolute: 0.7 10*3/uL (ref 0.1–1.0)
Monocytes Relative: 12.3 % — ABNORMAL HIGH (ref 3.0–12.0)
Neutro Abs: 3.3 10*3/uL (ref 1.4–7.7)
Neutrophils Relative %: 61.7 % (ref 43.0–77.0)
Platelets: 230 10*3/uL (ref 150.0–400.0)
RBC: 4.22 Mil/uL (ref 4.22–5.81)
RDW: 16.3 % — ABNORMAL HIGH (ref 11.5–15.5)
WBC: 5.3 10*3/uL (ref 4.0–10.5)

## 2018-12-11 LAB — AST: AST: 20 U/L (ref 0–37)

## 2018-12-11 LAB — ALT: ALT: 17 U/L (ref 0–53)

## 2018-12-11 MED ORDER — ZOSTER VAC RECOMB ADJUVANTED 50 MCG/0.5ML IM SUSR: 0.5000 mL | Freq: Once | INTRAMUSCULAR | 1 refills | Status: AC

## 2018-12-11 NOTE — Patient Instructions (Signed)
GO TO THE LAB : Get the blood work     GO TO THE FRONT DESK Schedule your next appointment   For a check up in 6 months    

## 2018-12-11 NOTE — Assessment & Plan Note (Signed)
DM: Good compliance with Glucotrol, Tradjenta, Actos plus met.  Feet exam negative, had recently eye check; check A1c. HTN: No ambulatory BPs, BP today is very good, continue carvedilol, Zestoretic.  Check a BMP. High cholesterol: On Vytorin, check LFTs Spinal stenosis: See last visit, was referred to Ortho with a question of radiculopathy, MRI was done: DJD/spinal stenosis, did physical therapy.  Now better. Right shoulder pain, ruptured R biceps: Since the last office visit, had acute rupture biceps, saw Ortho, was prescribed PT, is doing well.  Interestingly, his chronic shoulder pain went away. Preventive care: Shingrix prescription printed. Due for a colonoscopy, likes to switch from Dr Oletta Lamas, will contact [facility] 62month ,fasting

## 2018-12-11 NOTE — Progress Notes (Signed)
Pre visit review using our clinic review tool, if applicable. No additional management support is needed unless otherwise documented below in the visit note. 

## 2018-12-11 NOTE — Progress Notes (Signed)
Subjective:    Patient ID: Daniel Cruz, male    DOB: June 19, 1951, 68 y.o.   MRN: [id number]  DOS:  12/11/2018 Type of visit - description: Routine visit Back pain: Since the last visit he is doing great, chart reviewed. Right shoulder pain: It was a chronic problem, resolved after had a R biceps rupture. DM: Good med compliance, no ambulatory CBGs High cholesterol controlled at HTN: Due for labs. Preventive care discussed   Review of Systems Denies chest pain no difficulty breathing No lower extremity edema Currently he is feeling great, has been more active  Past Medical History:  Diagnosis Date  . Anemia, iron deficiency    hx  . CAD (coronary artery disease)     NSTEMI with PCI of the mid and distal RCA with 50-60% LAD 09/2000, s/p cath 01/2009 with patent RCA stent and 90% LAD s/p PCI of LAD and right PDA  . Cholelithiasis   . DIABETES MELLITUS, TYPE II 02/26/2007  . Fatty liver 02/2009   per u/s  . GERD (gastroesophageal reflux disease)   . HTN (hypertension)   . OSA (obstructive sleep apnea)    can't tol. a CPAP  . Plantar fasciitis of left foot   . PVC (premature ventricular contraction) 06/06/2017  . UTI (lower urinary tract infection)    in the 80s and 11/09    Past Surgical History:  Procedure Laterality Date  . CARDIAC CATHETERIZATION  10/2008   stents  . CHOLECYSTECTOMY N/A 07/16/2016   Procedure: LAPAROSCOPIC CHOLECYSTECTOMY WITH INTRAOPERATIVE CHOLANGIOGRAM;  Surgeon: [name], MD;  Location: [facility];  Service: General;  Laterality: N/A;  . SEPTOPLASTY     w/ bilateral inferior turbinate reductions  . TONSILLECTOMY      Social History   Socioeconomic History  . Marital status: Married    Spouse name: Not on file  . Number of children: 2  . Years of education: Not on file  . Highest education level: Not on file  Occupational History  . 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 Financial resource strain: Not on file  . Food insecurity:    Worry: Not on file    Inability: Not on file  . Transportation needs:    Medical: Not on file    Non-medical: Not on file  Tobacco Use  . Smoking status: Former Smoker    Packs/day: 1.50    Years: 16.00    Pack years: 24.00    Last attempt to quit: 07/08/2000    Years since quitting: 18.4  . Smokeless tobacco: Never Used  . Tobacco comment: 2001  Substance and Sexual Activity  . Alcohol use: Yes    Comment: socially  . Drug use: No  . Sexual activity: Yes  Lifestyle  . Physical activity:    Days per week: Not on file    Minutes per session: Not on file  . Stress: Not on file  Relationships  . Social connections:    Talks on phone: Not on file    Gets together: Not on file    Attends religious service: Not on file    Active member of club or organization: Not on file    Attends meetings of clubs or organizations: Not on file    Relationship status: Not on file  . Intimate partner violence:    Fear of current or ex partner: Not on file    Emotionally abused: Not  on file    Physically abused: Not on file    Forced sexual activity: Not on file  Other Topics Concern  . Not on file  Social History Narrative   Lives in [place], [place]   Lives w/ wife, 2 children, 2 g-kids   Retired April 2017             Allergies as of 12/11/2018      Reactions   Januvia [sitagliptin] Nausea Only, Other (See Comments)   Back Pain      Medication List       Accurate as of December 11, 2018  8:55 AM. Always use your most recent med list.        aspirin EC 81 MG tablet Take 81 mg by mouth daily.   carvedilol 6.25 MG tablet Commonly known as:  COREG Take 1 tablet (6.25 mg total) by mouth 2 (two) times daily with a meal.   dapagliflozin propanediol 10 MG Tabs tablet Commonly known as:  Farxiga Take 10 mg by mouth daily.   diazepam 5 MG tablet Commonly known as:  VALIUM 1-2 PO 1 hour before MRI, may repeat prn    ezetimibe-simvastatin 10-10 MG tablet Commonly known as:  VYTORIN Take 1 tablet by mouth daily.   glipiZIDE 5 MG 24 hr tablet Commonly known as:  GLUCOTROL XL Take 1 tablet (5 mg total) by mouth daily with breakfast.   ibuprofen 200 MG tablet Commonly known as:  ADVIL,MOTRIN Take 200 mg by mouth every 6 (six) hours as needed (For pain.).   linagliptin 5 MG Tabs tablet Commonly known as:  Tradjenta Take 1 tablet (5 mg total) by mouth daily.   lisinopril-hydrochlorothiazide 20-12.5 MG tablet Commonly known as:  PRINZIDE,ZESTORETIC Take 1 tablet by mouth daily.   multivitamin tablet Take 1 tablet by mouth daily.   nitroGLYCERIN 0.4 MG SL tablet Commonly known as:  NITROSTAT Place 1 tablet (0.4 mg total) under the tongue every 5 (five) minutes x 3 doses as needed for chest pain.   pantoprazole 40 MG tablet Commonly known as:  PROTONIX Take 40 mg by mouth 2 (two) times daily.   pioglitazone-metformin 15-850 MG tablet Commonly known as:  ACTOPLUS MET Take 1 tablet by mouth 2 (two) times daily with a meal.           Objective:   Physical Exam BP 126/68 (BP Location: Left Arm, Patient Position: Sitting, Cuff Size: Small)   Pulse 69   Temp 97.9 F (36.6 C) (Oral)   Resp 16   Ht _0  (1.803 m)   Wt 261 lb 4 oz (118.5 kg)   SpO2 98%   BMI 36.44 kg/m  General:   Well developed, NAD, BMI noted. HEENT:  Normocephalic . Face symmetric, atraumatic Lungs:  CTA B Normal respiratory effort, no intercostal retractions, no accessory muscle use. Heart: RRR,  no murmur.  No pretibial edema bilaterally  Skin: Not pale. Not jaundice DM foot exam: No edema, good pedal pulses, pinprick examination normal, no skin changes Neurologic:  alert & oriented X3.  Speech normal, gait appropriate for age and unassisted Psych--  Cognition and judgment appear intact.  Cooperative with normal attention span and concentration.  Behavior appropriate. No anxious or depressed appearing.       Assessment      Assessment DM DX 2008 HTN Hyperlipidemia CAD:  -MI 2001, syncope 2010 -07-2017: SOB, low risk stress test OSA, CPAP intolerant ED Mild chronic anemia, low iron stores. Saw GI 04-2017:  no further w/u  GERD Gallbladder polyps, 2014, surgery 07-2016 Fatty liver 2010 Used to see  Dr Allyson Sabal  PLAN DM: Good compliance with Glucotrol, Tradjenta, Actos plus met.  Feet exam negative, had recently eye check; check A1c. HTN: No ambulatory BPs, BP today is very good, continue carvedilol, Zestoretic.  Check a BMP. High cholesterol: On Vytorin, check LFTs Spinal stenosis: See last visit, was referred to Ortho with a question of radiculopathy, MRI was done: DJD/spinal stenosis, did physical therapy.  Now better. Right shoulder pain, ruptured R biceps: Since the last office visit, had acute rupture biceps, saw Ortho, was prescribed PT, is doing well.  Interestingly, his chronic shoulder pain went away. Preventive care: Shingrix prescription printed. Due for a colonoscopy, likes to switch from Dr Oletta Lamas, will contact [facility] ,fasting

## 2018-12-14 MED ORDER — GLIPIZIDE ER 10 MG PO TB24: 10.0000 mg | ORAL_TABLET | Freq: Every day | ORAL | 1 refills | Status: DC

## 2018-12-14 NOTE — Addendum Note (Signed)
Addended byDamita Dunnings D on: 12/14/2018 08:10 AM   Modules accepted: Orders

## 2018-12-14 NOTE — Telephone Encounter (Signed)
Spoke with the patient, he stated he sent the message to the wrong office.

## 2018-12-16 ENCOUNTER — Telehealth: Payer: Self-pay

## 2018-12-16 NOTE — Telephone Encounter (Signed)
Dr Ardis Hughs do you mean 2024?

## 2018-12-16 NOTE — Telephone Encounter (Signed)
Yes, thanks

## 2018-12-16 NOTE — Telephone Encounter (Signed)
Recall added to Epic for recall colon 10/2022

## 2018-12-16 NOTE — Telephone Encounter (Signed)
-----   Message from Milus Banister, MD sent at 12/16/2018  7:35 AM EDT ----- Regarding: RE: needs colonoscopy Daniel Cruz, I am happy to see him, take over his care.  It looks like he had a screening examination with Dr. Oletta Lamas 2014 and it was negative for polyps.  I am not sure he is due for screening again at this point.  Looks like he is probably safe until 2014 actually.  We will put him in our recall system to reach out to him around that time.  Daniel Cruz, Can you convert this to a phone note and also put him in for recall colonoscopy in January 2014.  Thank you ----- Message ----- From: Colon Branch, MD Sent: 12/11/2018   4:50 PM EDT To: Milus Banister, MD, Colon Branch, MD Subject: needs colonoscopy                              Dan, this gentleman used to see Dr. Oletta Lamas years ago, would like to switch to [facility], could you accept him, he is due for a colonoscopy.Thank youJP

## 2018-12-23 ENCOUNTER — Telehealth: Payer: Medicare Other | Admitting: Nurse Practitioner

## 2018-12-23 DIAGNOSIS — R059 Cough, unspecified: Secondary | ICD-10-CM

## 2018-12-23 DIAGNOSIS — R05 Cough: Secondary | ICD-10-CM

## 2018-12-23 DIAGNOSIS — J069 Acute upper respiratory infection, unspecified: Secondary | ICD-10-CM

## 2018-12-23 NOTE — Progress Notes (Signed)
E-Visit for Corona Virus Screening Based on your current symptoms, it seems unlikely that your symptoms are related to the [place] virus.   Coronavirus disease 2019 (COVID-19) is a respiratory illness that can spread from person to person. The virus that causes COVID-19 is a new virus that was first identified in the country of [place] but is now found in multiple other countries and has spread to the [place].  Symptoms associated with the virus are mild to severe fever, cough, and shortness of breath. There is currently no vaccine to protect against COVID-19, and there is no specific antiviral treatment for the virus.  It is vitally important that if you feel that you have an infection such as this virus or any other virus that you stay home and away from places where you may spread it to others.  Currently, not all patients are being tested. If the symptoms are mild and there is not a known exposure, performing the test is not indicated.  You can use medication such as delsym or mucinex  Reduce your risk of any infection by using the same precautions used for avoiding the common cold or flu:  Marland Kitchen Wash your hands often with soap and warm water for at least 20 seconds.  If soap and water are not readily available, use an alcohol-based hand sanitizer with at least 60% alcohol.  . If coughing or sneezing, cover your mouth and nose by coughing or sneezing into the elbow areas of your shirt or coat, into a tissue or into your sleeve (not your hands). . Avoid shaking hands with others and consider head nods or verbal greetings only. . Avoid touching your eyes, nose, or mouth with unwashed hands.  . Avoid close contact with people who are sick. . Avoid places or events with large numbers of people in one location, like concerts or sporting events. . Carefully consider travel plans you have or are making. . If you are planning any travel outside or inside the [place], visit the CDC's Travelers' Health webpage  for the latest health notices. . If you have some symptoms but not all symptoms, continue to monitor at home and seek medical attention if your symptoms worsen. . If you are having a medical emergency, call 911.  HOME CARE . Only take medications as instructed by your medical team. . Drink plenty of fluids and get plenty of rest. . A steam or ultrasonic humidifier can help if you have congestion.   GET HELP RIGHT AWAY IF: . You develop worsening fever. . You become short of breath . You cough up blood. . Your symptoms become more severe MAKE SURE YOU   Understand these instructions.  Will watch your condition.  Will get help right away if you are not doing well or get worse.  Your e-visit answers were reviewed by a board certified advanced clinical practitioner to complete your personal care plan.  Depending on the condition, your plan could have included both over the counter or prescription medications.  If there is a problem please reply once you have received a response from your provider. Your safety is important to [place].  If you have drug allergies check your prescription carefully.    You can use MyChart to ask questions about today's visit, request a non-urgent call back, or ask for a work or school excuse for 24 hours related to this e-Visit. If it has been greater than 24 hours you will need to follow up with your provider,  or enter a new e-Visit to address those concerns. You will get an e-mail in the next two days asking about your experience.  I hope that your e-visit has been valuable and will speed your recovery. Thank you for using e-visits.  5 minutes spent reviewing and documenting in chart.

## 2019-02-08 ENCOUNTER — Encounter: Payer: Self-pay | Admitting: Internal Medicine

## 2019-02-09 ENCOUNTER — Encounter: Payer: Self-pay | Admitting: Internal Medicine

## 2019-02-09 ENCOUNTER — Other Ambulatory Visit: Payer: Self-pay

## 2019-02-09 ENCOUNTER — Ambulatory Visit (INDEPENDENT_AMBULATORY_CARE_PROVIDER_SITE_OTHER): Payer: Medicare Other | Admitting: Internal Medicine

## 2019-02-09 DIAGNOSIS — E118 Type 2 diabetes mellitus with unspecified complications: Secondary | ICD-10-CM

## 2019-02-09 DIAGNOSIS — R1013 Epigastric pain: Secondary | ICD-10-CM

## 2019-02-09 DIAGNOSIS — K219 Gastro-esophageal reflux disease without esophagitis: Secondary | ICD-10-CM

## 2019-02-09 DIAGNOSIS — Z1211 Encounter for screening for malignant neoplasm of colon: Secondary | ICD-10-CM

## 2019-02-09 MED ORDER — FAMOTIDINE 40 MG PO TABS: 40.0000 mg | ORAL_TABLET | Freq: Every day | ORAL | 1 refills | Status: DC

## 2019-02-09 NOTE — Progress Notes (Signed)
Subjective:    Patient ID: Daniel Cruz, male    DOB: 09-29-1951, 68 y.o.   MRN: [id number]  DOS:  02/09/2019 Type of visit - description: Virtual Visit via Video Note  I connected with@ on 02/09/19 at  9:00 AM EDT by a video enabled telemedicine application and verified that I am speaking with the correct person using two identifiers.   THIS ENCOUNTER IS A VIRTUAL VISIT DUE TO COVID-19 - PATIENT WAS NOT SEEN IN THE OFFICE. PATIENT HAS CONSENTED TO VIRTUAL VISIT / TELEMEDICINE VISIT   Location of patient: home  Location of provider: office  I discussed the limitations of evaluation and management by telemedicine and the availability of in person appointments. The patient expressed understanding and agreed to proceed.  History of Present Illness: Acute visit The patient has his gallbladder removed in 2017, since then he has low level dyspepsia that was not bothering him much, symptoms well controlled on Protonix 40 mg twice a day. In March, symptoms started to increase: Feeling bloated, gassy, felt some chest pressure that was released by belching. Symptoms worse when he  eats certain foods like tomatoes. He self increase Protonix to 80 mg twice a day several days ago and since then symptoms have decreased. He also has chronic on and off mild loose stools that  improved after PPI dose increased    Review of Systems No fever chills No nausea or vomiting No blood in the stools No dysphagia or odynophagia  Past Medical History:  Diagnosis Date  . Anemia, iron deficiency    hx  . CAD (coronary artery disease)     NSTEMI with PCI of the mid and distal RCA with 50-60% LAD 09/2000, s/p cath 01/2009 with patent RCA stent and 90% LAD s/p PCI of LAD and right PDA  . Cholelithiasis   . DIABETES MELLITUS, TYPE II 02/26/2007  . Fatty liver 02/2009   per u/s  . GERD (gastroesophageal reflux disease)   . HTN (hypertension)   . OSA (obstructive sleep apnea)    can't tol. a CPAP  .  Plantar fasciitis of left foot   . PVC (premature ventricular contraction) 06/06/2017  . UTI (lower urinary tract infection)    in the 80s and 11/09    Past Surgical History:  Procedure Laterality Date  . CARDIAC CATHETERIZATION  10/2008   stents  . CHOLECYSTECTOMY N/A 07/16/2016   Procedure: LAPAROSCOPIC CHOLECYSTECTOMY WITH INTRAOPERATIVE CHOLANGIOGRAM;  Surgeon: [name], MD;  Location: [facility];  Service: General;  Laterality: N/A;  . SEPTOPLASTY     w/ bilateral inferior turbinate reductions  . TONSILLECTOMY      Social History   Socioeconomic History  . Marital status: Married    Spouse name: Not on file  . Number of children: 2  . Years of education: Not on file  . Highest education level: Not on file  Occupational History  . 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 Financial resource strain: Not on file  . Food insecurity:    Worry: Not on file    Inability: Not on file  . Transportation needs:    Medical: Not on file    Non-medical: Not on file  Tobacco Use  . Smoking status: Former Smoker    Packs/day: 1.50    Years: 16.00    Pack years: 24.00    Last attempt to quit: 07/08/2000    Years since quitting: 18.6  .  Smokeless tobacco: Never Used  . Tobacco comment: 2001  Substance and Sexual Activity  . Alcohol use: Yes    Comment: socially  . Drug use: No  . Sexual activity: Yes  Lifestyle  . Physical activity:    Days per week: Not on file    Minutes per session: Not on file  . Stress: Not on file  Relationships  . Social connections:    Talks on phone: Not on file    Gets together: Not on file    Attends religious service: Not on file    Active member of club or organization: Not on file    Attends meetings of clubs or organizations: Not on file    Relationship status: Not on file  . Intimate partner violence:    Fear of current or ex partner: Not on file    Emotionally abused:  Not on file    Physically abused: Not on file    Forced sexual activity: Not on file  Other Topics Concern  . Not on file  Social History Narrative   Lives in [place], [place]   Lives w/ wife, 2 children, 2 g-kids   Retired April 2017             Allergies as of 02/09/2019      Reactions   Januvia [sitagliptin] Nausea Only, Other (See Comments)   Back Pain      Medication List       Accurate as of Feb 09, 2019  8:55 AM. Always use your most recent med list.        aspirin EC 81 MG tablet Take 81 mg by mouth daily.   carvedilol 6.25 MG tablet Commonly known as:  COREG Take 1 tablet (6.25 mg total) by mouth 2 (two) times daily with a meal.   dapagliflozin propanediol 10 MG Tabs tablet Commonly known as:  Farxiga Take 10 mg by mouth daily.   diazepam 5 MG tablet Commonly known as:  VALIUM 1-2 PO 1 hour before MRI, may repeat prn   ezetimibe-simvastatin 10-10 MG tablet Commonly known as:  VYTORIN Take 1 tablet by mouth daily.   glipiZIDE 10 MG 24 hr tablet Commonly known as:  GLUCOTROL XL Take 1 tablet (10 mg total) by mouth daily with breakfast.   ibuprofen 200 MG tablet Commonly known as:  ADVIL Take 200 mg by mouth every 6 (six) hours as needed (For pain.).   linagliptin 5 MG Tabs tablet Commonly known as:  Tradjenta Take 1 tablet (5 mg total) by mouth daily.   lisinopril-hydrochlorothiazide 20-12.5 MG tablet Commonly known as:  ZESTORETIC Take 1 tablet by mouth daily.   multivitamin tablet Take 1 tablet by mouth daily.   nitroGLYCERIN 0.4 MG SL tablet Commonly known as:  NITROSTAT Place 1 tablet (0.4 mg total) under the tongue every 5 (five) minutes x 3 doses as needed for chest pain.   pantoprazole 40 MG tablet Commonly known as:  PROTONIX Take 40 mg by mouth 2 (two) times daily.   pioglitazone-metformin 15-850 MG tablet Commonly known as:  ACTOPLUS MET Take 1 tablet by mouth 2 (two) times daily with a meal.           Objective:   Physical  Exam There were no vitals taken for this visit. This is a virtual video visit, alert oriented x3, no apparent distress    Assessment     Assessment DM DX 2008 HTN Hyperlipidemia CAD:  -MI 2001, syncope 2010 -07-2017: SOB,  low risk stress test OSA, CPAP intolerant ED Mild chronic anemia, low iron stores. Saw GI 04-2017: no further w/u  GERD Gallbladder polyps, 2014, surgery 07-2016 Fatty liver 2010 Used to see  Dr Allyson Sabal  PLAN Dyspepsia, GERD: Very mild dyspepsia for a while, worse in the last few weeks, increased sxs   coincide with recent increase in glipizide dose (not clear if there is a relationship). Patient self increased Protonix to 80 mg twice a day and symptoms improved. Recent CBC showed no anemia. Plan: Go back to Protonix 40 mg twice a day, on empty stomach Add Pepcid, Rx sent Refer to [facility], GI due to increased dyspepsia, EGD?  Gastric empty study?.  He is also due for a colonoscopy DM: Last A1c 7.1, based on that result Glucotrol was increased from 5 mg to 10 mg.  He continue with farxiga, Actos plus met and Tradjenta. Next visit 06-2019, already scheduled     I discussed the assessment and treatment plan with the patient. The patient was provided an opportunity to ask questions and all were answered. The patient agreed with the plan and demonstrated an understanding of the instructions.   The patient was advised to call back or seek an in-person evaluation if the symptoms worsen or if the condition fails to improve as anticipated.

## 2019-02-10 NOTE — Assessment & Plan Note (Signed)
Dyspepsia, GERD: Very mild dyspepsia for a while, worse in the last few weeks, increased sxs   coincide with recent increase in glipizide dose (not clear if there is a relationship). Patient self increased Protonix to 80 mg twice a day and symptoms improved. Recent CBC showed no anemia. Plan: Go back to Protonix 40 mg twice a day, on empty stomach Add Pepcid, Rx sent Refer to [facility], GI due to increased dyspepsia, EGD?  Gastric empty study?.  He is also due for a colonoscopy DM: Last A1c 7.1, based on that result Glucotrol was increased from 5 mg to 10 mg.  He continue with farxiga, Actos plus met and Tradjenta. Next visit 06-2019, already scheduled

## 2019-02-17 ENCOUNTER — Encounter: Payer: Self-pay | Admitting: Gastroenterology

## 2019-02-17 ENCOUNTER — Other Ambulatory Visit: Payer: Self-pay

## 2019-02-17 ENCOUNTER — Ambulatory Visit (INDEPENDENT_AMBULATORY_CARE_PROVIDER_SITE_OTHER): Payer: Medicare Other | Admitting: Gastroenterology

## 2019-02-17 VITALS — Ht 71.0 in | Wt 261.0 lb

## 2019-02-17 DIAGNOSIS — Z1211 Encounter for screening for malignant neoplasm of colon: Secondary | ICD-10-CM | POA: Diagnosis not present

## 2019-02-17 DIAGNOSIS — K219 Gastro-esophageal reflux disease without esophagitis: Secondary | ICD-10-CM

## 2019-02-17 NOTE — Patient Instructions (Addendum)
He is going to decrease protonix to once per day (taking it shortly before breakfast meal daily) and we will change his official medicine list to show that.   He will continue taking Pepcid 40 mg pill 1 pill at bedtime every night.   He will try to avoid caffeinated beverages in the afternoon hours.     He knows losing weight might help acid control as well.   He will call our office to report on his response to the medicine changes described above in 3 to 4 weeks.

## 2019-02-17 NOTE — Progress Notes (Signed)
This service was provided via virtual visit.  Only audio was used.  The patient was located at home.  I was located in my office.  The patient did consent to this virtual visit and is aware of possible charges through their insurance for this visit.  The patient is a new patient referred by his PCP Dr. Larose Kells.  My certified medical assistant, Grace Bushy, contributed to this visit by contacting the patient by phone 1 or 2 business days prior to the appointment and also followed up on the recommendations I made after the visit.  Time spent on virtual visit: 27 min   HPI: This is a very pleasant 68 year old man referred by his primary care physician.  Back in 2017 he had GB removed due to 'sour stomach,' gassiness from what he says.  He believes the GB was healthy.  He recovered well. But his 'issues were not solved' by the surgery.  He's been on ppi, protonix for a very long time (was taking 3m BID, not reliably prior to meals either of the doses).  Starting about 2-3 months ago, he could feel acid in back of his throat.  He doesn't like taking medicines.  He doubled his protonix for 2-3 days and this significantly improved his UGI issues and ALSO his stools became more regular.  He is not pleased with Dr. EOletta Lamasand so requested to change providers.  He had e-visit with Dr. PLarose Kellslast week.  Reduced back to protonix 417mAM and PM.  He also takes famotidine 4027mHS and has done well.  Currently he is takes PPI before breakfast (usually 87m49mefore breakfast).  PM PPI dose is not perfectly regimented.  Takes Pepcid at bedtime.  On this regimen he feels well.    Usually eats dinner around 6pm, then lays down for bed 5 hours later.    Lounges on couch in PM.  No dysphagia at all.  Overall he's gained 5 pounds (hurt his knee) in past 6 months.  Has had allergy symptoms this season.  Has a layer of phlegm in his throat.  He drinks 3 cups of coffee a day, at least 1 or 2 of these are in the  afternoon hours.   Chief complaint is chronic GERD, routine risk for colon cancer   [facility] done for routine risk screening.  Findings diverticulosis.  No polyps or cancers.  He was recommended to have repeat colonoscopy at 10-year interval.  [facility] April 2014[facility], done for "follow-up of esophageal reflux, therapy for esophageal reflux".  Findings "widely patent GE junction", normal examination otherwise.  He was recommended to continue his current medicines and to follow-up with the endoscopist in 1 year  Blood work March 2020 CBC was essentially normal he is not anemic, normal MCV.  Hemoglobin A1c 7.1.  Basic metabolic profile normal, AST normal, ALT normal.   He underwent a laparoscopic cholecystectomy for symptomatic cholelithiasis October 2017.  Intra-Op cholangiogram was normal  ROS: complete GI ROS as described in HPI, all other review negative.  Constitutional:  No unintentional weight loss   Past Medical History:  Diagnosis Date  . Anemia, iron deficiency    hx  . CAD (coronary artery disease)     NSTEMI with PCI of the mid and distal RCA with 50-60% LAD 09/2000, s/p cath 01/2009 with patent RCA stent and 90% LAD s/p PCI of LAD and right PDA  .  Cholelithiasis   . DIABETES MELLITUS, TYPE II 02/26/2007  . Fatty liver 02/2009   per u/s  . GERD (gastroesophageal reflux disease)   . HTN (hypertension)   . OSA (obstructive sleep apnea)    can't tol. a CPAP  . Plantar fasciitis of left foot   . PVC (premature ventricular contraction) 06/06/2017  . UTI (lower urinary tract infection)    in the 80s and 11/09    Past Surgical History:  Procedure Laterality Date  . CARDIAC CATHETERIZATION  10/2008   stents  . CHOLECYSTECTOMY N/A 07/16/2016   Procedure: LAPAROSCOPIC CHOLECYSTECTOMY WITH INTRAOPERATIVE CHOLANGIOGRAM;  Surgeon: [name], MD;  Location: [facility];  Service:  General;  Laterality: N/A;  . SEPTOPLASTY     w/ bilateral inferior turbinate reductions  . TONSILLECTOMY      Current Outpatient Medications  Medication Sig Dispense Refill  . aspirin EC 81 MG tablet Take 81 mg by mouth daily.    . carvedilol (COREG) 6.25 MG tablet Take 1 tablet (6.25 mg total) by mouth 2 (two) times daily with a meal. 180 tablet 3  . dapagliflozin propanediol (FARXIGA) 10 MG TABS tablet Take 10 mg by mouth daily. 90 tablet 1  . diazepam (VALIUM) 5 MG tablet 1-2 PO 1 hour before MRI, may repeat prn 5 tablet 0  . ezetimibe-simvastatin (VYTORIN) 10-10 MG tablet Take 1 tablet by mouth daily. 90 tablet 1  . famotidine (PEPCID) 40 MG tablet Take 1 tablet (40 mg total) by mouth at bedtime. 90 tablet 1  . glipiZIDE (GLUCOTROL XL) 10 MG 24 hr tablet Take 1 tablet (10 mg total) by mouth daily with breakfast. 90 tablet 1  . ibuprofen (ADVIL,MOTRIN) 200 MG tablet Take 200 mg by mouth every 6 (six) hours as needed (For pain.).    Marland Kitchen linagliptin (TRADJENTA) 5 MG TABS tablet Take 1 tablet (5 mg total) by mouth daily. 90 tablet 1  . lisinopril-hydrochlorothiazide (PRINZIDE,ZESTORETIC) 20-12.5 MG tablet Take 1 tablet by mouth daily. 90 tablet 1  . Multiple Vitamin (MULTIVITAMIN) tablet Take 1 tablet by mouth daily.      . nitroGLYCERIN (NITROSTAT) 0.4 MG SL tablet Place 1 tablet (0.4 mg total) under the tongue every 5 (five) minutes x 3 doses as needed for chest pain. 25 tablet 3  . pantoprazole (PROTONIX) 40 MG tablet Take 40 mg by mouth 2 (two) times daily.    . pioglitazone-metformin (ACTOPLUS MET) 15-850 MG tablet Take 1 tablet by mouth 2 (two) times daily with a meal. 180 tablet 1   No current facility-administered medications for this visit.     Allergies as of 02/17/2019 - Review Complete 02/17/2019  Allergen Reaction Noted  . Januvia [sitagliptin] Nausea Only and Other (See Comments) 05/08/2015    Family History  Problem Relation Age of Onset  . Emphysema Mother        died   . COPD Mother   . Stroke Father   . Coronary artery disease Father   . Cancer Neg Hx        colon ,prostate  . Diabetes Neg Hx     Social History   Socioeconomic History  . Marital status: Married    Spouse name: Not on file  . Number of children: 2  . Years of education: Not on file  . Highest education level: Not on file  Occupational History  . 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 Financial resource strain: Not  on file  . Food insecurity:    Worry: Not on file    Inability: Not on file  . Transportation needs:    Medical: Not on file    Non-medical: Not on file  Tobacco Use  . Smoking status: Former Smoker    Packs/day: 1.50    Years: 16.00    Pack years: 24.00    Last attempt to quit: 07/08/2000    Years since quitting: 18.6  . Smokeless tobacco: Never Used  . Tobacco comment: 2001  Substance and Sexual Activity  . Alcohol use: Yes    Comment: socially  . Drug use: No  . Sexual activity: Yes  Lifestyle  . Physical activity:    Days per week: Not on file    Minutes per session: Not on file  . Stress: Not on file  Relationships  . Social connections:    Talks on phone: Not on file    Gets together: Not on file    Attends religious service: Not on file    Active member of club or organization: Not on file    Attends meetings of clubs or organizations: Not on file    Relationship status: Not on file  . Intimate partner violence:    Fear of current or ex partner: Not on file    Emotionally abused: Not on file    Physically abused: Not on file    Forced sexual activity: Not on file  Other Topics Concern  . Not on file  Social History Narrative   Lives in [place], [place]   Lives w/ wife, 2 children, 2 g-kids   Retired April 2017            Physical Exam: Unable to perform because this was a "telemed visit" due to current Covid-19 pandemic  Assessment and plan: 68 y.o. male with  chronic GERD, routine risk for colon cancer, obesity  We had a nice discussion about his chronic GERD.  I do believe he is having symptoms from acid regurgitation.  I do not know that he is getting really much out of his dinner time proton pump inhibitor since he often skips it, he often takes it after the meal or while he is eating.  I recommended he simply stop taking it around dinnertime and instead he is going to continue to be very regimented about taking a morning Protonix 20 to 30 minutes before breakfast.  He will also continue taking Pepcid at bedtime every night.  He will try to avoid afternoon coffee, caffeinated beverages.  He also understands that losing weight may help his acid regurg.  He has no alarm symptoms to warrant further testing at this point.  He will call to update on his symptoms in 3 to 4 weeks.  We discussed colon cancer screening.  He understands he had a colonoscopy 2014 with Dr. Oletta Lamas and no polyps were found.  He is considered at routine risk for colon cancer and according to multiple cancer guidelines he does not need colon cancer screening again for 10 years from that date.  Please see the "Patient Instructions" section for addition details about the plan.  Owens Loffler, MD [facility] 02/17/2019, 8:25 AM

## 2019-03-08 ENCOUNTER — Telehealth: Payer: Self-pay | Admitting: Gastroenterology

## 2019-03-08 NOTE — Telephone Encounter (Signed)
Dr Ardis Hughs please review. Pt states the protonix change, pepcid 40 mg at bedtime, and avoiding caffeine has not helped.   He continues to have GERD and bloating.

## 2019-03-08 NOTE — Telephone Encounter (Signed)
Patient called said that Dr. Ardis Hughs plan is not working regarding his meds. Would like to know what will be plan B.

## 2019-03-08 NOTE — Telephone Encounter (Signed)
I believe that he has been taking Protonix 40 mg pills 1 pill before breakfast and Pepcid 20 mg at bedtime.  I would like him to increase his Protonix so that he is taking it twice daily (shortly before breakfast and also shortly before dinner).  He should continue taking the Pepcid at bedtime.  Please offer him a new prescription for the Protonix at 60 pills/month, 5 refills.  He should call in 5 or 6 weeks again to update on his symptoms.

## 2019-03-08 NOTE — Telephone Encounter (Signed)
The pt has been advised and will increase to BID protonix and call back in 5-6 weeks.  He states he does not need a new script at this time and will contact his pharmacy when needed

## 2019-03-12 ENCOUNTER — Telehealth: Payer: Self-pay

## 2019-03-12 NOTE — Telephone Encounter (Signed)
Dr Ardis Hughs the pt was advised to update on his symptoms.  See My Chart message

## 2019-03-12 NOTE — Telephone Encounter (Signed)
The pt has been scheduled for pre visit and EGD on 6/9 at 330 pm and 6/15 at 9 am.  Pt notified via My Chart

## 2019-03-12 NOTE — Progress Notes (Addendum)
Virtual Visit via Video Note  I connected with patient on 03/15/19 at 11:00 AM EDT by audio enabled telemedicine application and verified that I am speaking with the correct person using two identifiers.   THIS ENCOUNTER IS A VIRTUAL VISIT DUE TO COVID-19 - PATIENT WAS NOT SEEN IN THE OFFICE. PATIENT HAS CONSENTED TO VIRTUAL VISIT / TELEMEDICINE VISIT   Location of patient: home  Location of provider: office  I discussed the limitations of evaluation and management by telemedicine and the availability of in person appointments. The patient expressed understanding and agreed to proceed.   Subjective:   ROSEVELT LUU is a 68 y.o. male who presents for Medicare Annual/Subsequent preventive examination.  Enjoys reading and in 2 bowling leagues.  Review of Systems: No ROS.  Medicare Wellness Virtual Visit.  Visual/audio telehealth visit, UTA vital signs.   See social history for additional risk factors. Cardiac Risk Factors include: advanced age (>40mn, >>61women);diabetes mellitus;dyslipidemia;hypertension;male gender;obesity (BMI >30kg/m2);sedentary lifestyle Home Safety/Smoke Alarms: Feels safe in home. Smoke alarms in place.  Lives with wife in split level home.  Male:   CCS- due 2024   PSA-  Lab Results  Component Value Date   PSA 1.51 03/11/2017   PSA 1.03 08/24/2014   PSA 1.15 11/30/2012   Eye- SMarica Otterevery 6 months. UTD per pt.      Objective:     Advanced Directives 03/15/2019 11/04/2018 08/27/2018 03/12/2018 07/22/2017 03/11/2017 07/08/2016  Does Patient Have a Medical Advance Directive? Yes Yes Yes Yes No Yes Yes  Type of AParamedicof APaxvilleLiving will [place]villeLiving will HMount JacksonLiving will HFence LakeLiving will - HDawsonLiving will Living will;Healthcare Power of Attorney  Does patient want to make changes to medical advance directive? No - Patient  declined - - - - No - Patient declined No - Patient declined  Copy of HPenascoin Chart? No - copy requested Yes - validated most recent copy scanned in chart (See row information) No - copy requested No - copy requested - No - copy requested No - copy requested  Would patient like information on creating a medical advance directive? - - - - No - Patient declined - -  Pre-existing out of facility DNR order (yellow form or pink MOST form) - - - - - - -    Tobacco Social History   Tobacco Use  Smoking Status Former Smoker  . Packs/day: 1.50  . Years: 16.00  . Pack years: 24.00  . Last attempt to quit: 07/08/2000  . Years since quitting: 18.6  Smokeless Tobacco Never Used  Tobacco Comment   2001     Counseling given: Not Answered Comment: 2001   Clinical Intake:  Pain : No/denies pain    Past Medical History:  Diagnosis Date  . Anemia, iron deficiency    hx  . CAD (coronary artery disease)     NSTEMI with PCI of the mid and distal RCA with 50-60% LAD 09/2000, s/p cath 01/2009 with patent RCA stent and 90% LAD s/p PCI of LAD and right PDA  . Cholelithiasis   . DIABETES MELLITUS, TYPE II 02/26/2007  . Fatty liver 02/2009   per u/s  . GERD (gastroesophageal reflux disease)   . HTN (hypertension)   . OSA (obstructive sleep apnea)    can't tol. a CPAP  . Plantar fasciitis of left foot   . PVC (premature ventricular contraction) 06/06/2017  .  UTI (lower urinary tract infection)    in the 80s and 11/09   Past Surgical History:  Procedure Laterality Date  . CARDIAC CATHETERIZATION  10/2008   stents  . CHOLECYSTECTOMY N/A 07/16/2016   Procedure: LAPAROSCOPIC CHOLECYSTECTOMY WITH INTRAOPERATIVE CHOLANGIOGRAM;  Surgeon: [name], MD;  Location: [facility];  Service: General;  Laterality: N/A;  . SEPTOPLASTY     w/ bilateral inferior turbinate reductions  . TONSILLECTOMY     Family History  Problem Relation Age of Onset  . Emphysema Mother        died   . COPD Mother   . Stroke Father   . Coronary artery disease Father   . Cancer Neg Hx        colon ,prostate  . Diabetes Neg Hx    Social History   Socioeconomic History  . Marital status: Married    Spouse name: Not on file  . Number of children: 2  . Years of education: Not on file  . Highest education level: Not on file  Occupational History  . 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 Financial resource strain: Not on file  . Food insecurity:    Worry: Not on file    Inability: Not on file  . Transportation needs:    Medical: Not on file    Non-medical: Not on file  Tobacco Use  . Smoking status: Former Smoker    Packs/day: 1.50    Years: 16.00    Pack years: 24.00    Last attempt to quit: 07/08/2000    Years since quitting: 18.6  . Smokeless tobacco: Never Used  . Tobacco comment: 2001  Substance and Sexual Activity  . Alcohol use: Yes    Comment: socially  . Drug use: No  . Sexual activity: Yes  Lifestyle  . Physical activity:    Days per week: Not on file    Minutes per session: Not on file  . Stress: Not on file  Relationships  . Social connections:    Talks on phone: Not on file    Gets together: Not on file    Attends religious service: Not on file    Active member of club or organization: Not on file    Attends meetings of clubs or organizations: Not on file    Relationship status: Not on file  Other Topics Concern  . Not on file  Social History Narrative   Lives in [place], [place]   Lives w/ wife, 2 children, 2 g-kids   Retired April 2017           Outpatient Encounter Medications as of 03/15/2019  Medication Sig  . aspirin EC 81 MG tablet Take 81 mg by mouth daily.  . carvedilol (COREG) 6.25 MG tablet Take 1 tablet (6.25 mg total) by mouth 2 (two) times daily with a meal.  . dapagliflozin propanediol (FARXIGA) 10 MG TABS tablet Take 10 mg by mouth daily.  Marland Kitchen ezetimibe-simvastatin  (VYTORIN) 10-10 MG tablet Take 1 tablet by mouth daily.  . famotidine (PEPCID) 40 MG tablet Take 1 tablet (40 mg total) by mouth at bedtime.  Marland Kitchen glipiZIDE (GLUCOTROL XL) 10 MG 24 hr tablet Take 1 tablet (10 mg total) by mouth daily with breakfast.  . ibuprofen (ADVIL,MOTRIN) 200 MG tablet Take 200 mg by mouth every 6 (six) hours as needed (For pain.).  Marland Kitchen linagliptin (TRADJENTA) 5 MG TABS tablet Take 1 tablet (5 mg  total) by mouth daily.  Marland Kitchen lisinopril-hydrochlorothiazide (PRINZIDE,ZESTORETIC) 20-12.5 MG tablet Take 1 tablet by mouth daily.  . Multiple Vitamin (MULTIVITAMIN) tablet Take 1 tablet by mouth daily.    . pantoprazole (PROTONIX) 40 MG tablet Take 40 mg by mouth daily.   . pioglitazone-metformin (ACTOPLUS MET) 15-850 MG tablet Take 1 tablet by mouth 2 (two) times daily with a meal.  . diazepam (VALIUM) 5 MG tablet 1-2 PO 1 hour before MRI, may repeat prn (Patient not taking: Reported on 03/15/2019)  . nitroGLYCERIN (NITROSTAT) 0.4 MG SL tablet Place 1 tablet (0.4 mg total) under the tongue every 5 (five) minutes x 3 doses as needed for chest pain. (Patient not taking: Reported on 03/15/2019)   No facility-administered encounter medications on file as of 03/15/2019.     Activities of Daily Living In your present state of health, do you have any difficulty performing the following activities: 03/15/2019  Hearing? N  Vision? N  Difficulty concentrating or making decisions? N  Walking or climbing stairs? N  Dressing or bathing? N  Doing errands, shopping? N  Preparing Food and eating ? N  Using the Toilet? N  In the past six months, have you accidently leaked urine? N  Do you have problems with loss of bowel control? N  Managing your Medications? N  Managing your Finances? N  Housekeeping or managing your Housekeeping? N  Some recent data might be hidden    Patient Care Team: Colon Branch, MD as PCP - General (Internal Medicine) Sueanne Margarita, MD as PCP - Cardiology (Cardiology)  Marica Otter, OD as Consulting Physician (Optometry) Laurence Spates, MD as Consulting Physician (Gastroenterology) [name], MD as Consulting Physician (General Surgery) Everardo All, PT as Physical Therapist (Physical Therapy)   Assessment:   This is a routine wellness examination for Eliyas. Physical assessment deferred to PCP.  Exercise Activities and Dietary recommendations Current Exercise Habits: The patient does not participate in regular exercise at present(plays golf 2-3x/ week), Exercise limited by: None identified Diet (meal preparation, eat out, water intake, caffeinated beverages, dairy products, fruits and vegetables): well balanced, on average, 3 meals per day   Goals    . Weight (lb) < 245 lb (111.1 kg)     Eat smaller portions and walk 4x/week for at least 21mn.       Fall Risk Fall Risk  03/12/2018 03/11/2017 05/08/2016 12/22/2015 05/08/2015  Falls in the past year? _0      Depression Screen PHQ 2/9 Scores 12/11/2018 03/12/2018 03/11/2017 05/08/2016  PHQ - 2 Score 0 0 0 0  PHQ- 9 Score 2 - - -    Cognitive Function Ad8 score reviewed for issues:  Issues making decisions:no  Less interest in hobbies / activities:no  Repeats questions, stories (family complaining):no  Trouble using ordinary gadgets (microwave, computer, phone):no  Forgets the month or year: no  Mismanaging finances: no  Remembering appts:no  Daily problems with thinking and/or memory:no Ad8 score is=0         Immunization History  Administered Date(s) Administered  . H1N1 11/08/2008  . Influenza Split 11/01/2011  . Influenza Whole 07/20/2007, 10/20/2009, 08/14/2010  . Influenza, High Dose Seasonal PF 09/15/2015, 11/08/2016, 08/10/2018  . Influenza,inj,Quad PF,6+ Mos 06/30/2013, 08/24/2014  . Influenza-Unspecified 07/14/2017  . Pneumococcal Conjugate-13 08/24/2014  . Pneumococcal Polysaccharide-23 07/20/2007, 03/11/2017  . Td 10/08/2003, 12/22/2015  . Zoster  11/30/2012   Screening Tests Health Maintenance  Topic Date Due  . INFLUENZA VACCINE  05/08/2019  . OPHTHALMOLOGY EXAM  05/16/2019  . HEMOGLOBIN A1C  06/13/2019  . FOOT EXAM  12/11/2019  . COLONOSCOPY  01/08/2023  . TETANUS/TDAP  12/21/2025  . Hepatitis C Screening  Completed  . PNA vac Low Risk Adult  Completed     Plan:   See you next year!  Continue to eat heart healthy diet (full of fruits, vegetables, whole grains, lean protein, water--limit salt, fat, and sugar intake) and increase physical activity as tolerated.  Continue doing brain stimulating activities (puzzles, reading, adult coloring books, staying active) to keep memory sharp.   Bring a copy of your living will and/or healthcare power of attorney to your next office visit.    I have personally reviewed and noted the following in the patient's chart:   . Medical and social history . Use of alcohol, tobacco or illicit drugs  . Current medications and supplements . Functional ability and status . Nutritional status . Physical activity . Advanced directives . List of other physicians . Hospitalizations, surgeries, and ER visits in previous 12 months . Vitals . Screenings to include cognitive, depression, and falls . Referrals and appointments  In addition, I have reviewed and discussed with patient certain preventive protocols, quality metrics, and best practice recommendations. A written personalized care plan for preventive services as well as general preventive health recommendations were provided to patient.     Naaman Plummer [place], [place]  03/15/2019  Kathlene November, MD

## 2019-03-12 NOTE — Telephone Encounter (Signed)
-----   Message from Milus Banister, MD sent at 03/12/2019  9:29 AM EDT ----- He needs EGD [facility] in the next 2-3 weeks.  See recent mychart notes.  Thanks

## 2019-03-15 ENCOUNTER — Ambulatory Visit (INDEPENDENT_AMBULATORY_CARE_PROVIDER_SITE_OTHER): Payer: Medicare Other | Admitting: *Deleted

## 2019-03-15 ENCOUNTER — Other Ambulatory Visit: Payer: Self-pay

## 2019-03-15 ENCOUNTER — Encounter: Payer: Self-pay | Admitting: *Deleted

## 2019-03-15 DIAGNOSIS — Z0000 Encounter for general adult medical examination without abnormal findings: Secondary | ICD-10-CM

## 2019-03-15 NOTE — Patient Instructions (Signed)
See you next year!  Continue to eat heart healthy diet (full of fruits, vegetables, whole grains, lean protein, water--limit salt, fat, and sugar intake) and increase physical activity as tolerated.  Continue doing brain stimulating activities (puzzles, reading, adult coloring books, staying active) to keep memory sharp.   Bring a copy of your living will and/or healthcare power of attorney to your next office visit.   Daniel Cruz , Thank you for taking time to come for your Medicare Wellness Visit. I appreciate your ongoing commitment to your health goals. Please review the following plan we discussed and let me know if I can assist you in the future.   These are the goals we discussed: Goals    . Weight (lb) < 245 lb (111.1 kg)     Eat smaller portions and walk 4x/week for at least 58min.       This is a list of the screening recommended for you and due dates:  Health Maintenance  Topic Date Due  . Flu Shot  05/08/2019  . Eye exam for diabetics  05/16/2019  . Hemoglobin A1C  06/13/2019  . Complete foot exam   12/11/2019  . Colon Cancer Screening  01/08/2023  . Tetanus Vaccine  12/21/2025  .  Hepatitis C: One time screening is recommended by Center for Disease Control  (CDC) for  adults born from [birth year] through 1967.   Completed  . Pneumonia vaccines  Completed    Health Maintenance After Age 21 After age 67, you are at a higher risk for certain long-term diseases and infections as well as injuries from falls. Falls are a major cause of broken bones and head injuries in people who are older than age 78. Getting regular preventive care can help to keep you healthy and well. Preventive care includes getting regular testing and making lifestyle changes as recommended by your health care provider. Talk with your health care provider about:  Which screenings and tests you should have. A screening is a test that checks for a disease when you have no symptoms.  A diet and exercise plan  that is right for you. What should I know about screenings and tests to prevent falls? Screening and testing are the best ways to find a health problem early. Early diagnosis and treatment give you the best chance of managing medical conditions that are common after age 50. Certain conditions and lifestyle choices may make you more likely to have a fall. Your health care provider may recommend:  Regular vision checks. Poor vision and conditions such as cataracts can make you more likely to have a fall. If you wear glasses, make sure to get your prescription updated if your vision changes.  Medicine review. Work with your health care provider to regularly review all of the medicines you are taking, including over-the-counter medicines. Ask your health care provider about any side effects that may make you more likely to have a fall. Tell your health care provider if any medicines that you take make you feel dizzy or sleepy.  Osteoporosis screening. Osteoporosis is a condition that causes the bones to get weaker. This can make the bones weak and cause them to break more easily.  Blood pressure screening. Blood pressure changes and medicines to control blood pressure can make you feel dizzy.  Strength and balance checks. Your health care provider may recommend certain tests to check your strength and balance while standing, walking, or changing positions.  Foot health exam. Foot pain and numbness,  as well as not wearing proper footwear, can make you more likely to have a fall.  Depression screening. You may be more likely to have a fall if you have a fear of falling, feel emotionally low, or feel unable to do activities that you used to do.  Alcohol use screening. Using too much alcohol can affect your balance and may make you more likely to have a fall. What actions can I take to lower my risk of falls? General instructions  Talk with your health care provider about your risks for falling. Tell  your health care provider if: ? You fall. Be sure to tell your health care provider about all falls, even ones that seem minor. ? You feel dizzy, sleepy, or off-balance.  Take over-the-counter and prescription medicines only as told by your health care provider. These include any supplements.  Eat a healthy diet and maintain a healthy weight. A healthy diet includes low-fat dairy products, low-fat (lean) meats, and fiber from whole grains, beans, and lots of fruits and vegetables. Home safety  Remove any tripping hazards, such as rugs, cords, and clutter.  Install safety equipment such as grab bars in bathrooms and safety rails on stairs.  Keep rooms and walkways well-lit. Activity   Follow a regular exercise program to stay fit. This will help you maintain your balance. Ask your health care provider what types of exercise are appropriate for you.  If you need a cane or walker, use it as recommended by your health care provider.  Wear supportive shoes that have nonskid soles. Lifestyle  Do not drink alcohol if your health care provider tells you not to drink.  If you drink alcohol, limit how much you have: ? 0-1 drink a day for women. ? 0-2 drinks a day for men.  Be aware of how much alcohol is in your drink. In the [place] one drink equals one typical bottle of beer (12 oz), one-half glass of wine (5 oz), or one shot of hard liquor (1 oz).  Do not use any products that contain nicotine or tobacco, such as cigarettes and e-cigarettes. If you need help quitting, ask your health care provider. Summary  Having a healthy lifestyle and getting preventive care can help to protect your health and wellness after age 99.  Screening and testing are the best way to find a health problem early and help you avoid having a fall. Early diagnosis and treatment give you the best chance for managing medical conditions that are more common for people who are older than age 19.  Falls are a major  cause of broken bones and head injuries in people who are older than age 68. Take precautions to prevent a fall at home.  Work with your health care provider to learn what changes you can make to improve your health and wellness and to prevent falls. This information is not intended to replace advice given to you by your health care provider. Make sure you discuss any questions you have with your health care provider. Document Released: 08/06/2017 Document Revised: 08/06/2017 Document Reviewed: 08/06/2017 Elsevier Interactive Patient Education  2019 [employer].

## 2019-03-16 ENCOUNTER — Other Ambulatory Visit: Payer: Self-pay

## 2019-03-16 ENCOUNTER — Ambulatory Visit (AMBULATORY_SURGERY_CENTER): Payer: Medicare Other

## 2019-03-16 VITALS — Ht 71.0 in | Wt 250.0 lb

## 2019-03-16 DIAGNOSIS — K219 Gastro-esophageal reflux disease without esophagitis: Secondary | ICD-10-CM

## 2019-03-16 NOTE — Progress Notes (Signed)
No egg or soy allergy known to patient  No issues with past sedation with any surgeries  or procedures, no intubation problems  No diet pills per patient No home 02 use per patient  No blood thinners per patient  Pt denies issues with constipation  No A fib or A flutter  EMMI video sent to pt's e mail  

## 2019-03-19 ENCOUNTER — Telehealth: Payer: Self-pay

## 2019-03-19 NOTE — Telephone Encounter (Signed)
Left message and a call back number

## 2019-03-22 ENCOUNTER — Encounter: Payer: Self-pay | Admitting: Gastroenterology

## 2019-03-22 ENCOUNTER — Other Ambulatory Visit: Payer: Self-pay

## 2019-03-22 ENCOUNTER — Ambulatory Visit (AMBULATORY_SURGERY_CENTER): Payer: Medicare Other | Admitting: Gastroenterology

## 2019-03-22 VITALS — BP 117/70 | HR 54 | Temp 98.4°F | Resp 14 | Ht 71.0 in | Wt 261.0 lb

## 2019-03-22 DIAGNOSIS — K219 Gastro-esophageal reflux disease without esophagitis: Secondary | ICD-10-CM | POA: Diagnosis not present

## 2019-03-22 DIAGNOSIS — I10 Essential (primary) hypertension: Secondary | ICD-10-CM | POA: Diagnosis not present

## 2019-03-22 DIAGNOSIS — E119 Type 2 diabetes mellitus without complications: Secondary | ICD-10-CM | POA: Diagnosis not present

## 2019-03-22 DIAGNOSIS — K2970 Gastritis, unspecified, without bleeding: Secondary | ICD-10-CM | POA: Diagnosis not present

## 2019-03-22 DIAGNOSIS — G4733 Obstructive sleep apnea (adult) (pediatric): Secondary | ICD-10-CM | POA: Diagnosis not present

## 2019-03-22 DIAGNOSIS — I2510 Atherosclerotic heart disease of native coronary artery without angina pectoris: Secondary | ICD-10-CM | POA: Diagnosis not present

## 2019-03-22 DIAGNOSIS — K2950 Unspecified chronic gastritis without bleeding: Secondary | ICD-10-CM | POA: Diagnosis not present

## 2019-03-22 MED ORDER — SODIUM CHLORIDE 0.9 % IV SOLN: 500.0000 mL | Freq: Once | INTRAVENOUS | Status: DC

## 2019-03-22 NOTE — Op Note (Signed)
[facility] Patient Name: Breck [place] Procedure Date: 03/22/2019 8:37 AM MRN: [id number] Endoscopist: Milus Banister , MD Age: 67 Referring MD:  Date of Birth: 1950/12/31 Gender: Male Account #: [id number] Procedure:                Upper GI endoscopy Indications:              Functional Dyspepsia, Suspected esophageal reflux Medicines:                Monitored Anesthesia Care Procedure:                Pre-Anesthesia Assessment:                           - Prior to the procedure, a History and Physical                            was performed, and patient medications and                            allergies were reviewed. The patient's tolerance of                            previous anesthesia was also reviewed. The risks                            and benefits of the procedure and the sedation                            options and risks were discussed with the patient.                            All questions were answered, and informed consent                            was obtained. Prior Anticoagulants: The patient has                            taken no previous anticoagulant or antiplatelet                            agents. ASA Grade Assessment: II - A patient with                            mild systemic disease. After reviewing the risks                            and benefits, the patient was deemed in                            satisfactory condition to undergo the procedure.                           After obtaining informed consent, the endoscope was  passed under direct vision. Throughout the                            procedure, the patient's blood pressure, pulse, and                            oxygen saturations were monitored continuously. The                            Endoscope was introduced through the mouth, and                            advanced to the second part of duodenum. The upper                            GI  endoscopy was accomplished without difficulty.                            The patient tolerated the procedure well. Scope In: Scope Out: Findings:                 Mild inflammation characterized by erythema and                            granularity was found in the gastric antrum.                            Biopsies were taken with a cold forceps for                            histology.                           The exam was otherwise without abnormality. Complications:            No immediate complications. Estimated blood loss:                            None. Estimated Blood Loss:     Estimated blood loss: none. Impression:               - Mild gastritis, biopsied to check for H. pylori.                           - The examination was otherwise normal. Recommendation:           - Patient has a contact number available for                            emergencies. The signs and symptoms of potential                            delayed complications were discussed with the                            patient. Return to normal activities tomorrow.  Written discharge instructions were provided to the                            patient.                           - Resume previous diet.                           - Continue present medications.                           - Await pathology results. Milus Banister, MD 03/22/2019 8:54:39 AM This report has been signed electronically.

## 2019-03-22 NOTE — Patient Instructions (Signed)
Discharge instructions given. Handout on Gastritis. Resume previous medications. YOU HAD AN ENDOSCOPIC PROCEDURE TODAY AT [facility]:   Refer to the procedure report that was given to you for any specific questions about what was found during the examination.  If the procedure report does not answer your questions, please call your gastroenterologist to clarify.  If you requested that your care partner not be given the details of your procedure findings, then the procedure report has been included in a sealed envelope for you to review at your convenience later.  YOU SHOULD EXPECT: Some feelings of bloating in the abdomen. Passage of more gas than usual.  Walking can help get rid of the air that was put into your GI tract during the procedure and reduce the bloating. If you had a lower endoscopy (such as a colonoscopy or flexible sigmoidoscopy) you may notice spotting of blood in your stool or on the toilet paper. If you underwent a bowel prep for your procedure, you may not have a normal bowel movement for a few days.  Please Note:  You might notice some irritation and congestion in your nose or some drainage.  This is from the oxygen used during your procedure.  There is no need for concern and it should clear up in a day or so.  SYMPTOMS TO REPORT IMMEDIATELY:    Following upper endoscopy (EGD)  Vomiting of blood or coffee ground material  New chest pain or pain under the shoulder blades  Painful or persistently difficult swallowing  New shortness of breath  Fever of 100F or higher  Black, tarry-looking stools  For urgent or emergent issues, a gastroenterologist can be reached at any hour by calling [phone].   DIET:  We do recommend a small meal at first, but then you may proceed to your regular diet.  Drink plenty of fluids but you should avoid alcoholic beverages for 24 hours.  ACTIVITY:  You should plan to take it easy for the rest of today and you should NOT  DRIVE or use heavy machinery until tomorrow (because of the sedation medicines used during the test).    FOLLOW UP: Our staff will call the number listed on your records 48-72 hours following your procedure to check on you and address any questions or concerns that you may have regarding the information given to you following your procedure. If we do not reach you, we will leave a message.  We will attempt to reach you two times.  During this call, we will ask if you have developed any symptoms of COVID 19. If you develop any symptoms (ie: fever, flu-like symptoms, shortness of breath, cough etc.) before then, please call [phone].  If you test positive for Covid 19 in the 2 weeks post procedure, please call and report this information to [place].    If any biopsies were taken you will be contacted by phone or by letter within the next 1-3 weeks.  Please call us at [phone] if you have not heard about the biopsies in 3 weeks.    SIGNATURES/CONFIDENTIALITY: You and/or your care partner have signed paperwork which will be entered into your electronic medical record.  These signatures attest to the fact that that the information above on your After Visit Summary has been reviewed and is understood.  Full responsibility of the confidentiality of this discharge information lies with you and/or your care-partner.

## 2019-03-22 NOTE — Progress Notes (Signed)
Pt's states no medical or surgical changes since previsit or office visit.  Temp CW Vitals JB 

## 2019-03-22 NOTE — Progress Notes (Signed)
Report to PACU, RN, vss, BBS= Clear.  

## 2019-03-22 NOTE — Progress Notes (Signed)
Called to room to assist during endoscopic procedure.  Patient ID and intended procedure confirmed with present staff. Received instructions for my participation in the procedure from the performing physician.  

## 2019-03-24 ENCOUNTER — Telehealth: Payer: Self-pay

## 2019-03-24 NOTE — Telephone Encounter (Signed)
  Follow up Call-  Call back number 03/22/2019  Post procedure Call Back phone  # [phone]- [phone]  Permission to leave phone message Yes  Some recent data might be hidden     Patient questions:  Do you have a fever, pain , or abdominal swelling? No. Pain Score  0 *  Have you tolerated food without any problems? No.  Have you been able to return to your normal activities? Yes.    Do you have any questions about your discharge instructions: Diet   No. Medications  No. Follow up visit  No.  Do you have questions or concerns about your Care? No.  Actions: * If pain score is 4 or above: No action needed, pain <4.  1. Have you developed a fever since your procedure? no  2.   Have you had an respiratory symptoms (SOB or cough) since your procedure? no  3.   Have you tested positive for COVID 19 since your procedure no  4.   Have you had any family members/close contacts diagnosed with the COVID 19 since your procedure?  no   If yes to any of these questions please route to Joylene John, RN and Alphonsa Gin, Therapist, sports.

## 2019-03-25 ENCOUNTER — Telehealth: Payer: Self-pay | Admitting: *Deleted

## 2019-03-25 NOTE — Telephone Encounter (Signed)
Dr. Ardis Hughs,  This patient called in and talked to our scheduling staff, demanding his results from his procedure Monday. He was informed that we generally don't have results back and a letter would come in 1-3 weeks. He insisted on talking with the manager of [employer], I took the phone call as charge. He demanded his results again saying he was told he would have the answer in 2-3 days. Pathology is not back yet, which I informed him. He is quite frustrated stating he still feels bad and wants to know what is going on. I told him I would forward this phone call to you and ask you to reach out to him.  Could you please call this patient on his mobile number?  Thank you, T

## 2019-03-26 NOTE — Telephone Encounter (Signed)
The pt was given the information per Dr Ardis Hughs.  He thanked me and will await further communication regarding results.

## 2019-03-26 NOTE — Telephone Encounter (Signed)
I just checked this morning and the pathology from his gastric biopsies are still not back.  Please call him, let him know that when the results are back we will contact him as soon as possible.  I am sorry that it is taking longer than he expected.  If the gastric biopsies do not show H. pylori then he will likely need further testing, probably imaging with a CT scan abdomen pelvis.

## 2019-03-30 ENCOUNTER — Other Ambulatory Visit: Payer: Self-pay

## 2019-03-30 DIAGNOSIS — R140 Abdominal distension (gaseous): Secondary | ICD-10-CM

## 2019-03-30 DIAGNOSIS — R1084 Generalized abdominal pain: Secondary | ICD-10-CM

## 2019-04-06 ENCOUNTER — Other Ambulatory Visit: Payer: Self-pay | Admitting: Internal Medicine

## 2019-04-06 MED ORDER — GLIPIZIDE ER 10 MG PO TB24: 10.0000 mg | ORAL_TABLET | Freq: Every day | ORAL | 1 refills | Status: DC

## 2019-04-08 ENCOUNTER — Ambulatory Visit (HOSPITAL_COMMUNITY)
Admission: RE | Admit: 2019-04-08 | Discharge: 2019-04-08 | Disposition: A | Discharge status: Home / Self Care | Payer: Medicare Other | Source: Ambulatory Visit | Attending: Gastroenterology | Admitting: Gastroenterology

## 2019-04-08 ENCOUNTER — Other Ambulatory Visit: Payer: Self-pay

## 2019-04-08 DIAGNOSIS — R140 Abdominal distension (gaseous): Secondary | ICD-10-CM

## 2019-04-08 DIAGNOSIS — E119 Type 2 diabetes mellitus without complications: Secondary | ICD-10-CM | POA: Diagnosis not present

## 2019-04-08 DIAGNOSIS — K219 Gastro-esophageal reflux disease without esophagitis: Secondary | ICD-10-CM | POA: Diagnosis not present

## 2019-04-08 DIAGNOSIS — R1084 Generalized abdominal pain: Secondary | ICD-10-CM | POA: Diagnosis not present

## 2019-04-08 MED ORDER — TECHNETIUM TC 99M SULFUR COLLOID
2.1000 | Freq: Once | INTRAVENOUS | Status: AC | PRN
  Administered 2019-04-08: 08:00:00 2.1 via INTRAVENOUS

## 2019-04-20 ENCOUNTER — Telehealth: Payer: Self-pay

## 2019-04-20 NOTE — Telephone Encounter (Signed)
Covid-19 screening questions   Do you now or have you had a fever in the last 14 days no   Do you have any respiratory symptoms of shortness of breath or cough now or in the last 14 days no  Do you have any family members or close contacts with diagnosed or suspected Covid-19 in the past 14 days no  Have you been tested for Covid-19 and found to be positive no          

## 2019-04-21 ENCOUNTER — Encounter: Payer: Self-pay | Admitting: Gastroenterology

## 2019-04-21 ENCOUNTER — Ambulatory Visit (INDEPENDENT_AMBULATORY_CARE_PROVIDER_SITE_OTHER): Payer: Medicare Other | Admitting: Gastroenterology

## 2019-04-21 VITALS — BP 124/74 | HR 60 | Temp 97.8°F | Ht 71.0 in | Wt 263.0 lb

## 2019-04-21 DIAGNOSIS — K219 Gastro-esophageal reflux disease without esophagitis: Secondary | ICD-10-CM

## 2019-04-21 MED ORDER — FAMOTIDINE 20 MG PO TABS: 20.0000 mg | ORAL_TABLET | Freq: Every day | ORAL | 3 refills | Status: DC

## 2019-04-21 NOTE — Patient Instructions (Addendum)
Stay on protonix take 1 tab before breakfast and 1 tab before dinner  Take Pepcid 20 mg at bedtime  Take Zyrtec OTC every morning  Please call in 3-4 weeks to let us know how you are doing  Thank you for entrusting me with your care and choosing [facility].  Dr Ardis Hughs

## 2019-04-21 NOTE — Progress Notes (Signed)
Review of pertinent gastrointestinal problems: 1.  Routine risk for colon cancer; colonoscopy April 2014, Dr. Leonie Douglas at [facility] gastroenterology, done for routine risk screening.  Findings diverticulosis.  No polyps or cancers.  He was recommended to have repeat colonoscopy at 10-year interval. 2.  Symptomatic cholelithiasis he underwent a laparoscopic cholecystectomy for symptomatic cholelithiasis October 2017.  Intra-Op cholangiogram was normal 3. Chronic GERD Upper endoscopy April 2014, Dr. Leonie Douglas at [facility], done for "follow-up of esophageal reflux, therapy for esophageal reflux".  Findings "widely patent GE junction", normal examination otherwise.  He was recommended to continue his current medicines and to follow-up with the endoscopist in 1 year.  GERD-like symptoms led to repeat EGD June 2020 Dr. Ardis Hughs.  Minor gastritis that was H. pylori negative.  Further testing with gastric emptying scan July 2020 was completely normal.  HPI: This is a very pleasant 68 year old man whom I last saw the time of the upper endoscopy.  See the results summarized above.  He was not happy with Dr. Oletta Lamas and so establish care with me May 2020.  First office visit we essentially discussed GERD-like symptoms.  And I recommended that he stop his dinnertime proton pump inhibitor and instead take the morning proton pump inhibitor religiously 20 to 30 minutes before breakfast as well as a Pepcid at bedtime.  He was going to try to avoid caffeinated beverages.  I also explained that losing weight could help his GERD symptoms.  Seem to worsen, and after about a month we proceeded with EGD June 2020, this showed some very minor gastritis.  Biopsies were negative for H. pylori.  I recommended gastric emptying scan as the next step on his testing.  The gastric emptying scan was completely normal.  Currently he is taking proton pump inhibitor Protonix, shortly before breakfast and about 2 or 3 hours  before dinner.  He also takes Pepcid at bedtime.  He is continuing to have dyspeptic symptoms that start after dinner and last for 2 to 3 hours ending up with some substernal chest fullness.  He describes a lot of bloating.  He also describes a lot of drainage, sinus type every 3 to 4 days which requires Zyrtec.  He gets itchy eyes and runny nose as well.  He says when his drainage is happening he definitely notices that his dyspeptic symptoms are worse  He has a chronic globus sensation of phlegm in his throat.   Chief complaint is GERD, dyspepsia, allergic symptoms  ROS: complete GI ROS as described in HPI, all other review negative.  Constitutional:  No unintentional weight loss   Past Medical History:  Diagnosis Date  . Anemia, iron deficiency    hx  . CAD (coronary artery disease)     NSTEMI with PCI of the mid and distal RCA with 50-60% LAD 09/2000, s/p cath 01/2009 with patent RCA stent and 90% LAD s/p PCI of LAD and right PDA  . Cholelithiasis   . DIABETES MELLITUS, TYPE II 02/26/2007  . Fatty liver 02/2009   per u/s  . GERD (gastroesophageal reflux disease)   . HTN (hypertension)   . Myocardial infarction ([place])    2001  . OSA (obstructive sleep apnea)    can't tol. a CPAP  . Plantar fasciitis of left foot   . PVC (premature ventricular contraction) 06/06/2017  . Sleep apnea   . UTI (lower urinary tract infection)    in the 80s and 11/09    Past Surgical History:  Procedure Laterality Date  . CARDIAC CATHETERIZATION  10/2008   stents  . CHOLECYSTECTOMY N/A 07/16/2016   Procedure: LAPAROSCOPIC CHOLECYSTECTOMY WITH INTRAOPERATIVE CHOLANGIOGRAM;  Surgeon: [name], MD;  Location: [place];  Service: General;  Laterality: N/A;  . SEPTOPLASTY     w/ bilateral inferior turbinate reductions  . TONSILLECTOMY      Current Outpatient Medications  Medication Sig Dispense Refill  . aspirin EC 81 MG tablet Take 81 mg by mouth daily.    . carvedilol (COREG) 6.25 MG tablet  Take 1 tablet (6.25 mg total) by mouth 2 (two) times daily with a meal. 180 tablet 3  . dapagliflozin propanediol (FARXIGA) 10 MG TABS tablet Take 10 mg by mouth daily. 90 tablet 1  . diazepam (VALIUM) 5 MG tablet 1-2 PO 1 hour before MRI, may repeat prn 5 tablet 0  . ezetimibe-simvastatin (VYTORIN) 10-10 MG tablet Take 1 tablet by mouth daily. 90 tablet 1  . famotidine (PEPCID) 40 MG tablet Take 1 tablet (40 mg total) by mouth at bedtime. 90 tablet 1  . glipiZIDE (GLUCOTROL XL) 10 MG 24 hr tablet Take 1 tablet (10 mg total) by mouth daily with breakfast. 90 tablet 1  . ibuprofen (ADVIL,MOTRIN) 200 MG tablet Take 200 mg by mouth every 6 (six) hours as needed (For pain.).    Marland Kitchen linagliptin (TRADJENTA) 5 MG TABS tablet Take 1 tablet (5 mg total) by mouth daily. 90 tablet 1  . lisinopril-hydrochlorothiazide (ZESTORETIC) 20-12.5 MG tablet Take 1 tablet by mouth daily. 90 tablet 1  . Multiple Vitamin (MULTIVITAMIN) tablet Take 1 tablet by mouth daily.      . nitroGLYCERIN (NITROSTAT) 0.4 MG SL tablet Place 1 tablet (0.4 mg total) under the tongue every 5 (five) minutes x 3 doses as needed for chest pain. (Patient not taking: Reported on 03/22/2019) 25 tablet 3  . pantoprazole (PROTONIX) 40 MG tablet Take 40 mg by mouth daily.     . pioglitazone-metformin (ACTOPLUS MET) 15-850 MG tablet Take 1 tablet by mouth 2 (two) times daily with a meal. 180 tablet 1   No current facility-administered medications for this visit.     Allergies as of 04/21/2019 - Review Complete 04/21/2019  Allergen Reaction Noted  . Januvia [sitagliptin] Nausea Only and Other (See Comments) 05/08/2015    Family History  Problem Relation Age of Onset  . Emphysema Mother        died  . COPD Mother   . Stroke Father   . Coronary artery disease Father   . Cancer Neg Hx        colon ,prostate  . Diabetes Neg Hx   . Colon cancer Neg Hx   . Esophageal cancer Neg Hx   . Rectal cancer Neg Hx   . Stomach cancer Neg Hx      Social History   Socioeconomic History  . Marital status: Married    Spouse name: Not on file  . Number of children: 2  . Years of education: Not on file  . Highest education level: Not on file  Occupational History  . 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 Financial resource strain: Not on file  . Food insecurity    Worry: Not on file    Inability: Not on file  . Transportation needs    Medical: Not on file    Non-medical: Not on file  Tobacco Use  . Smoking status: Former Smoker  Packs/day: 1.50    Years: 16.00    Pack years: 24.00    Quit date: 07/08/2000    Years since quitting: 18.7  . Smokeless tobacco: Never Used  . Tobacco comment: 2001  Substance and Sexual Activity  . Alcohol use: Yes    Comment: socially  . Drug use: No  . Sexual activity: Yes  Lifestyle  . Physical activity    Days per week: Not on file    Minutes per session: Not on file  . Stress: Not on file  Relationships  . Social Herbalist on phone: Not on file    Gets together: Not on file    Attends religious service: Not on file    Active member of club or organization: Not on file    Attends meetings of clubs or organizations: Not on file    Relationship status: Not on file  . Intimate partner violence    Fear of current or ex partner: Not on file    Emotionally abused: Not on file    Physically abused: Not on file    Forced sexual activity: Not on file  Other Topics Concern  . Not on file  Social History Narrative   Lives in [place], [place]   Lives w/ wife, 2 children, 2 g-kids   Retired April 2017            Physical Exam: BP 124/74   Pulse 60   Temp 97.8 F (36.6 C)   Ht '5\' 11"'$  (1.803 m)   Wt 263 lb (119.3 kg)   BMI 36.68 kg/m  Constitutional: generally well-appearing Psychiatric: alert and oriented x3 Abdomen: soft, nontender, nondistended, no obvious ascites, no peritoneal signs, normal  bowel sounds No peripheral edema noted in lower extremities  Assessment and plan: 68 y.o. male with GERD, dyspepsia, allergy type sinus symptoms  First I think he does have GERD and symptoms related to acid reflux.  He is on proton pump number twice daily Pepcid at bedtime.  He does not take the evening proton pump inhibitor dose correctly with food and so he will change that.  He will therefore be on maximum medical acid suppression and he understands that.  I think he is also having some sinus drainage and allergy type symptoms that might be exacerbating his bloating and upper GI symptoms.  I recommended he start taking his Zyrtec on a every day basis rather than just as needed and call me to report on his response in 2 or 3 weeks.  Please see the "Patient Instructions" section for addition details about the plan.  Owens Loffler, MD [facility] 04/21/2019, 11:30 AM

## 2019-04-24 ENCOUNTER — Other Ambulatory Visit: Payer: Self-pay | Admitting: Internal Medicine

## 2019-04-26 MED ORDER — GLIPIZIDE ER 10 MG PO TB24: 10.0000 mg | ORAL_TABLET | Freq: Every day | ORAL | 1 refills | Status: DC

## 2019-05-02 ENCOUNTER — Other Ambulatory Visit: Payer: Self-pay | Admitting: Internal Medicine

## 2019-05-11 ENCOUNTER — Ambulatory Visit: Payer: Medicare Other | Admitting: Gastroenterology

## 2019-05-13 ENCOUNTER — Other Ambulatory Visit: Payer: Self-pay | Admitting: Internal Medicine

## 2019-05-13 MED ORDER — GLIPIZIDE ER 10 MG PO TB24: 10.0000 mg | ORAL_TABLET | Freq: Every day | ORAL | 0 refills | Status: DC

## 2019-06-02 DIAGNOSIS — H5203 Hypermetropia, bilateral: Secondary | ICD-10-CM | POA: Diagnosis not present

## 2019-06-02 DIAGNOSIS — H25813 Combined forms of age-related cataract, bilateral: Secondary | ICD-10-CM | POA: Diagnosis not present

## 2019-06-02 DIAGNOSIS — H524 Presbyopia: Secondary | ICD-10-CM | POA: Diagnosis not present

## 2019-06-02 DIAGNOSIS — E119 Type 2 diabetes mellitus without complications: Secondary | ICD-10-CM | POA: Diagnosis not present

## 2019-06-02 DIAGNOSIS — H52223 Regular astigmatism, bilateral: Secondary | ICD-10-CM | POA: Diagnosis not present

## 2019-06-02 LAB — HM DIABETES EYE EXAM

## 2019-06-07 ENCOUNTER — Encounter: Payer: Self-pay | Admitting: Internal Medicine

## 2019-06-15 ENCOUNTER — Other Ambulatory Visit: Payer: Self-pay

## 2019-06-15 ENCOUNTER — Encounter: Payer: Self-pay | Admitting: Internal Medicine

## 2019-06-15 ENCOUNTER — Ambulatory Visit (INDEPENDENT_AMBULATORY_CARE_PROVIDER_SITE_OTHER): Payer: Medicare Other | Admitting: Internal Medicine

## 2019-06-15 VITALS — BP 92/72 | HR 66 | Temp 96.9°F | Resp 16 | Ht 71.0 in | Wt 264.4 lb

## 2019-06-15 DIAGNOSIS — E7800 Pure hypercholesterolemia, unspecified: Secondary | ICD-10-CM

## 2019-06-15 DIAGNOSIS — E118 Type 2 diabetes mellitus with unspecified complications: Secondary | ICD-10-CM

## 2019-06-15 DIAGNOSIS — K219 Gastro-esophageal reflux disease without esophagitis: Secondary | ICD-10-CM

## 2019-06-15 DIAGNOSIS — Z23 Encounter for immunization: Secondary | ICD-10-CM | POA: Diagnosis not present

## 2019-06-15 DIAGNOSIS — R1013 Epigastric pain: Secondary | ICD-10-CM | POA: Diagnosis not present

## 2019-06-15 DIAGNOSIS — Z125 Encounter for screening for malignant neoplasm of prostate: Secondary | ICD-10-CM | POA: Diagnosis not present

## 2019-06-15 DIAGNOSIS — I10 Essential (primary) hypertension: Secondary | ICD-10-CM | POA: Diagnosis not present

## 2019-06-15 LAB — LIPID PANEL
Cholesterol: 110 mg/dL (ref 0–200)
HDL: 41.9 mg/dL (ref >39.00)
LDL Cholesterol: 47 mg/dL (ref 0–99)
NonHDL: 68.5
Total CHOL/HDL Ratio: 3
Triglycerides: 106 mg/dL (ref 0.0–149.0)
VLDL: 21.2 mg/dL (ref 0.0–40.0)

## 2019-06-15 LAB — COMPREHENSIVE METABOLIC PANEL
ALT: 15 U/L (ref 0–53)
AST: 21 U/L (ref 0–37)
Albumin: 4.1 g/dL (ref 3.5–5.2)
Alkaline Phosphatase: 50 U/L (ref 39–117)
BUN: 21 mg/dL (ref 6–23)
CO2: 27 mEq/L (ref 19–32)
Calcium: 9.5 mg/dL (ref 8.4–10.5)
Chloride: 103 mEq/L (ref 96–112)
Creatinine, Ser: 1.09 mg/dL (ref 0.40–1.50)
GFR: 66.78 mL/min (ref >60.00)
Glucose, Bld: 84 mg/dL (ref 70–99)
Potassium: 5 mEq/L (ref 3.5–5.1)
Sodium: 138 mEq/L (ref 135–145)
Total Bilirubin: 0.4 mg/dL (ref 0.2–1.2)
Total Protein: 6.7 g/dL (ref 6.0–8.3)

## 2019-06-15 LAB — CBC WITH DIFFERENTIAL/PLATELET
Basophils Absolute: 0 10*3/uL (ref 0.0–0.1)
Basophils Relative: 0.9 % (ref 0.0–3.0)
Eosinophils Absolute: 0.2 10*3/uL (ref 0.0–0.7)
Eosinophils Relative: 3.6 % (ref 0.0–5.0)
HCT: 39.2 % (ref 39.0–52.0)
Hemoglobin: 12.9 g/dL — ABNORMAL LOW (ref 13.0–17.0)
Lymphocytes Relative: 22 % (ref 12.0–46.0)
Lymphs Abs: 1 10*3/uL (ref 0.7–4.0)
MCHC: 32.9 g/dL (ref 30.0–36.0)
MCV: 96.7 fl (ref 78.0–100.0)
Monocytes Absolute: 0.6 10*3/uL (ref 0.1–1.0)
Monocytes Relative: 12.3 % — ABNORMAL HIGH (ref 3.0–12.0)
Neutro Abs: 2.8 10*3/uL (ref 1.4–7.7)
Neutrophils Relative %: 61.2 % (ref 43.0–77.0)
Platelets: 211 10*3/uL (ref 150.0–400.0)
RBC: 4.05 Mil/uL — ABNORMAL LOW (ref 4.22–5.81)
RDW: 15.9 % — ABNORMAL HIGH (ref 11.5–15.5)
WBC: 4.6 10*3/uL (ref 4.0–10.5)

## 2019-06-15 LAB — HEMOGLOBIN A1C: Hgb A1c MFr Bld: 6.9 % — ABNORMAL HIGH (ref 4.6–6.5)

## 2019-06-15 LAB — PSA: PSA: 1.4 ng/mL (ref 0.10–4.00)

## 2019-06-15 MED ORDER — BLOOD GLUCOSE METER KIT: PACK | 0 refills | Status: DC

## 2019-06-15 NOTE — Progress Notes (Signed)
Subjective:    Patient ID: Daniel Cruz, male    DOB: Jul 21, 1951, 68 y.o.   MRN: [id number]  DOS:  06/15/2019 Type of visit - description: Routine visit Dyspepsia: Chart reviewed, doing better. DM, no ambulatory CBGs.  He gained weight in the last few months but is working on it and has lost 6 pounds in the last few weeks. HTN: BP today is low but he is asymptomatic. Occasionally gets dizzy when he bends over, last few seconds, no associated symptoms    BP Readings from Last 3 Encounters:  06/15/19 92/72  04/21/19 124/74  03/22/19 117/70    Review of Systems Denies fever chills No chest pain, difficulty breathing or palpitations No diarrhea or blood in the stools Past Medical History:  Diagnosis Date  . Anemia, iron deficiency    hx  . CAD (coronary artery disease)     NSTEMI with PCI of the mid and distal RCA with 50-60% LAD 09/2000, s/p cath 01/2009 with patent RCA stent and 90% LAD s/p PCI of LAD and right PDA  . Cholelithiasis   . DIABETES MELLITUS, TYPE II 02/26/2007  . Fatty liver 02/2009   per u/s  . GERD (gastroesophageal reflux disease)   . HTN (hypertension)   . Myocardial infarction ([place])    2001  . OSA (obstructive sleep apnea)    can't tol. a CPAP  . Plantar fasciitis of left foot   . PVC (premature ventricular contraction) 06/06/2017  . Sleep apnea   . UTI (lower urinary tract infection)    in the 80s and 11/09    Past Surgical History:  Procedure Laterality Date  . CARDIAC CATHETERIZATION  10/2008   stents  . CHOLECYSTECTOMY N/A 07/16/2016   Procedure: LAPAROSCOPIC CHOLECYSTECTOMY WITH INTRAOPERATIVE CHOLANGIOGRAM;  Surgeon: [name], MD;  Location: [facility];  Service: General;  Laterality: N/A;  . SEPTOPLASTY     w/ bilateral inferior turbinate reductions  . TONSILLECTOMY      Social History   Socioeconomic History  . Marital status: Married    Spouse name: Not on file  . Number of children: 2  . Years of education: Not on file  .  Highest education level: Not on file  Occupational History  . 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 Financial resource strain: Not on file  . Food insecurity    Worry: Not on file    Inability: Not on file  . Transportation needs    Medical: Not on file    Non-medical: Not on file  Tobacco Use  . Smoking status: Former Smoker    Packs/day: 1.50    Years: 16.00    Pack years: 24.00    Quit date: 07/08/2000    Years since quitting: 18.9  . Smokeless tobacco: Never Used  . Tobacco comment: 2001  Substance and Sexual Activity  . Alcohol use: Yes    Comment: socially  . Drug use: No  . Sexual activity: Yes  Lifestyle  . Physical activity    Days per week: Not on file    Minutes per session: Not on file  . Stress: Not on file  Relationships  . Social Herbalist on phone: Not on file    Gets together: Not on file    Attends religious service: Not on file    Active member of club or organization: Not on file    Attends meetings of  clubs or organizations: Not on file    Relationship status: Not on file  . Intimate partner violence    Fear of current or ex partner: Not on file    Emotionally abused: Not on file    Physically abused: Not on file    Forced sexual activity: Not on file  Other Topics Concern  . Not on file  Social History Narrative   Lives in [place], [place]   Lives w/ wife, 2 children, 2 g-kids   Retired April 2017             Allergies as of 06/15/2019      Reactions   Januvia [sitagliptin] Nausea Only, Other (See Comments)   Back Pain      Medication List       Accurate as of June 15, 2019 11:59 PM. If you have any questions, ask your nurse or doctor.        STOP taking these medications   diazepam 5 MG tablet Commonly known as: VALIUM Stopped by: Kathlene November, MD     TAKE these medications   aspirin EC 81 MG tablet Take 81 mg by mouth daily.   blood glucose  meter kit and supplies Dispense based on patient and insurance preference. Check blood sugar once daily Started by: Kathlene November, MD   carvedilol 6.25 MG tablet Commonly known as: COREG Take 1 tablet (6.25 mg total) by mouth 2 (two) times daily with a meal.   ezetimibe-simvastatin 10-10 MG tablet Commonly known as: VYTORIN Take 1 tablet by mouth daily.   famotidine 20 MG tablet Commonly known as: Pepcid Take 1 tablet (20 mg total) by mouth at bedtime. What changed: Another medication with the same name was removed. Continue taking this medication, and follow the directions you see here. Changed by: Kathlene November, MD   Farxiga 10 MG Tabs tablet Generic drug: dapagliflozin propanediol Take 10 mg by mouth daily.   glipiZIDE 10 MG 24 hr tablet Commonly known as: GLUCOTROL XL Take 1 tablet (10 mg total) by mouth daily with breakfast.   ibuprofen 200 MG tablet Commonly known as: ADVIL Take 200 mg by mouth every 6 (six) hours as needed (For pain.).   linagliptin 5 MG Tabs tablet Commonly known as: Tradjenta Take 1 tablet (5 mg total) by mouth daily.   lisinopril-hydrochlorothiazide 20-12.5 MG tablet Commonly known as: ZESTORETIC Take 1 tablet by mouth daily.   multivitamin tablet Take 1 tablet by mouth daily.   nitroGLYCERIN 0.4 MG SL tablet Commonly known as: NITROSTAT Place 1 tablet (0.4 mg total) under the tongue every 5 (five) minutes x 3 doses as needed for chest pain.   pantoprazole 40 MG tablet Commonly known as: PROTONIX Take 40 mg by mouth 2 (two) times daily before a meal. What changed: Another medication with the same name was removed. Continue taking this medication, and follow the directions you see here. Changed by: Kathlene November, MD   pioglitazone-metformin [phone] MG tablet Commonly known as: ACTOPLUS MET Take 1 tablet by mouth 2 (two) times daily with a meal.           Objective:   Physical Exam BP 92/72 (BP Location: Left Arm, Patient Position: Sitting, Cuff  Size: Normal)   Pulse 66   Temp (!) 96.9 F (36.1 C) (Temporal)   Resp 16   Ht '5\' 11"'$  (1.803 m)   Wt 264 lb 6 oz (119.9 kg)   SpO2 97%   BMI 36.87 kg/m   General:  Well developed, NAD, BMI noted Neck: No  thyromegaly  HEENT:  Normocephalic . Face symmetric, atraumatic Lungs:  CTA B Normal respiratory effort, no intercostal retractions, no accessory muscle use. Heart: RRR,  no murmur.  No pretibial edema bilaterally  Abdomen:  Not distended, soft, non-tender. No rebound or rigidity.   Skin: Exposed areas without rash. Not pale. Not jaundice Neurologic:  alert & oriented X3.  Speech normal, gait appropriate for age and unassisted Strength symmetric and appropriate for age.  Psych: Cognition and judgment appear intact.  Cooperative with normal attention span and concentration.  Behavior appropriate. No anxious or depressed appearing.     Assessment    Assessment DM DX 2008 HTN Hyperlipidemia CAD:  -MI 2001, syncope 2010 -07-2017: SOB, low risk stress test OSA, CPAP intolerant ED Mild chronic anemia, low iron stores. Saw GI 04-2017: no further w/u  GERD Gallbladder polyps, 2014, surgery 07-2016 Fatty liver 2010 Used to see  Dr Cena Benton DM: Continue farxiga, Glucotrol, Tradjenta, Actos plus met.  Check A1c.  Advised to check ambulatory CBGs, he is somewhat reluctant, explained the benefits, goals.  Prescription for a glucometer and supplies sent. HTN: Currently on Zestoretic and carvedilol.  BP today slightly low, he is asymptomatic, recommend to check BPs at home, goals provided High cholesterol: On Vytorin, check a FLP Dyspepsia, GERD:  Saw GI 02-2019.   Gastric emptying scan July 2020 normal EGD June 2020, mild gastritis, BX H. pylori negative. Currently on Protonix twice a day and Pepcid, doing great.  Symptoms actually decreased after he started an antihistaminic for postnasal dripping.  Related? Preventive care: Check a PSA, flu shot.  Next colonoscopy  2024 per GI note RTC 3 months

## 2019-06-15 NOTE — Progress Notes (Signed)
Pre visit review using our clinic review tool, if applicable. No additional management support is needed unless otherwise documented below in the visit note. 

## 2019-06-15 NOTE — Patient Instructions (Addendum)
GO TO THE LAB : Get the blood work     GO TO THE FRONT DESK Schedule your next appointment   For a check up in 3 months    Check the  blood pressure 2 or 3 times a  week  BP GOAL is between 110/65 and  135/85. If it is consistently higher or lower, let me know   Diabetes: Check your blood sugar    3-4 times a week  Check your blood sugar  at different times of the day  GOALS: Fasting before a meal 70- 130 2 hours after a meal less than 180

## 2019-06-16 NOTE — Assessment & Plan Note (Signed)
DM: Continue farxiga, Glucotrol, Tradjenta, Actos plus met.  Check A1c.  Advised to check ambulatory CBGs, he is somewhat reluctant, explained the benefits, goals.  Prescription for a glucometer and supplies sent. HTN: Currently on Zestoretic and carvedilol.  BP today slightly low, he is asymptomatic, recommend to check BPs at home, goals provided High cholesterol: On Vytorin, check a FLP Dyspepsia, GERD:  Saw GI 02-2019.   Gastric emptying scan July 2020 normal EGD June 2020, mild gastritis, BX H. pylori negative. Currently on Protonix twice a day and Pepcid, doing great.  Symptoms actually decreased after he started an antihistaminic for postnasal dripping.  Related? Preventive care: Check a PSA, flu shot.  Next colonoscopy 2024 per GI note RTC 3 months

## 2019-07-25 NOTE — Progress Notes (Signed)
Cardiology Office Note:    Date:  07/26/2019   ID:  Daniel Cruz, DOB [birth year], MRN [id number]  PCP:  Colon Branch, MD  Cardiologist:  Fransico Him, MD    Referring MD: Colon Branch, MD   Chief Complaint  Patient presents with  . Coronary Artery Disease  . Hypertension  . Hyperlipidemia    History of Present Illness:    Daniel Cruz is a 68 y.o. male with a hx of ASCAD with NSTEMI 2001 with PCI of the mid and distal RCA with 50-60% prox LAD and subsequent PCI of 80% LAD and right PDA 01/2009, diabetes, hypertension, hyperlipidemia, obstructive sleep apnea and GERD.  He is here today for followup and is doing well.  He denies any chest pain or pressure, SOB, DOE, PND, orthopnea, LE edema, dizziness, palpitations or syncope. He is compliant with his meds and is tolerating meds with no SE.    Past Medical History:  Diagnosis Date  . Anemia, iron deficiency    hx  . CAD (coronary artery disease)     NSTEMI with PCI of the mid and distal RCA with 50-60% LAD 09/2000, s/p cath 01/2009 with patent RCA stent and 90% LAD s/p PCI of LAD and right PDA  . Cholelithiasis   . DIABETES MELLITUS, TYPE II 02/26/2007  . Fatty liver 02/2009   per u/s  . GERD (gastroesophageal reflux disease)   . HTN (hypertension)   . Myocardial infarction ([place])    2001  . OSA (obstructive sleep apnea)    can't tol. a CPAP  . Plantar fasciitis of left foot   . PVC (premature ventricular contraction) 06/06/2017  . Sleep apnea   . UTI (lower urinary tract infection)    in the 80s and 11/09    Past Surgical History:  Procedure Laterality Date  . CARDIAC CATHETERIZATION  10/2008   stents  . CHOLECYSTECTOMY N/A 07/16/2016   Procedure: LAPAROSCOPIC CHOLECYSTECTOMY WITH INTRAOPERATIVE CHOLANGIOGRAM;  Surgeon: [name], MD;  Location: [facility];  Service: General;  Laterality: N/A;  . SEPTOPLASTY     w/ bilateral inferior turbinate reductions  . TONSILLECTOMY      Current Medications: Current  Meds  Medication Sig  . aspirin EC 81 MG tablet Take 81 mg by mouth daily.  . blood glucose meter kit and supplies Dispense based on patient and insurance preference. Check blood sugar once daily  . carvedilol (COREG) 6.25 MG tablet Take 1 tablet (6.25 mg total) by mouth 2 (two) times daily with a meal.  . dapagliflozin propanediol (FARXIGA) 10 MG TABS tablet Take 10 mg by mouth daily.  Marland Kitchen ezetimibe-simvastatin (VYTORIN) 10-10 MG tablet Take 1 tablet by mouth daily.  . famotidine (PEPCID) 20 MG tablet Take 1 tablet (20 mg total) by mouth at bedtime.  Marland Kitchen glipiZIDE (GLUCOTROL XL) 10 MG 24 hr tablet Take 1 tablet (10 mg total) by mouth daily with breakfast.  . ibuprofen (ADVIL,MOTRIN) 200 MG tablet Take 200 mg by mouth every 6 (six) hours as needed (For pain.).  Marland Kitchen linagliptin (TRADJENTA) 5 MG TABS tablet Take 1 tablet (5 mg total) by mouth daily.  Marland Kitchen lisinopril-hydrochlorothiazide (ZESTORETIC) 20-12.5 MG tablet Take 1 tablet by mouth daily.  . Multiple Vitamin (MULTIVITAMIN) tablet Take 1 tablet by mouth daily.    . nitroGLYCERIN (NITROSTAT) 0.4 MG SL tablet Place 1 tablet (0.4 mg total) under the tongue every 5 (five) minutes x 3 doses as needed for chest pain.  . pantoprazole (PROTONIX) 40  MG tablet Take 40 mg by mouth 2 (two) times daily before a meal.  . pioglitazone-metformin (ACTOPLUS MET) 15-850 MG tablet Take 1 tablet by mouth 2 (two) times daily with a meal.     Allergies:   Januvia [sitagliptin]   Social History   Socioeconomic History  . Marital status: Married    Spouse name: Not on file  . Number of children: 2  . Years of education: Not on file  . Highest education level: Not on file  Occupational History  . 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 Financial resource strain: Not on file  . Food insecurity    Worry: Not on file    Inability: Not on file  . Transportation needs    Medical: Not on file     Non-medical: Not on file  Tobacco Use  . Smoking status: Former Smoker    Packs/day: 1.50    Years: 16.00    Pack years: 24.00    Quit date: 07/08/2000    Years since quitting: 19.0  . Smokeless tobacco: Never Used  . Tobacco comment: 2001  Substance and Sexual Activity  . Alcohol use: Yes    Comment: socially  . Drug use: No  . Sexual activity: Yes  Lifestyle  . Physical activity    Days per week: Not on file    Minutes per session: Not on file  . Stress: Not on file  Relationships  . Social Herbalist on phone: Not on file    Gets together: Not on file    Attends religious service: Not on file    Active member of club or organization: Not on file    Attends meetings of clubs or organizations: Not on file    Relationship status: Not on file  Other Topics Concern  . Not on file  Social History Narrative   Lives in [place], [place]   Lives w/ wife, 2 children, 2 g-kids   Retired April 2017            Family History: The patient's family history includes COPD in his mother; Coronary artery disease in his father; Emphysema in his mother; Stroke in his father. There is no history of Cancer, Diabetes, Colon cancer, Esophageal cancer, Rectal cancer, or Stomach cancer.  ROS:   Please see the history of present illness.    ROS  All other systems reviewed and negative.   EKGs/Labs/Other Studies Reviewed:    The following studies were reviewed today: PAP compliance download  EKG:  EKG is  ordered today.  The ekg ordered today demonstrates Sinus bradycardia at 56bpm with no ST changes  Recent Labs: 06/15/2019: ALT 15; BUN 21; Creatinine, Ser 1.09; Hemoglobin 12.9; Platelets 211.0; Potassium 5.0; Sodium 138   Recent Lipid Panel    Component Value Date/Time   CHOL 110 06/15/2019 0907   TRIG 106.0 06/15/2019 0907   TRIG 106 09/15/2006 0947   HDL 41.90 06/15/2019 0907   CHOLHDL 3 06/15/2019 0907   VLDL 21.2 06/15/2019 0907   LDLCALC 47 06/15/2019 0907     Physical Exam:    VS:  BP 112/70   Pulse (!) 56   Ht '5\' 11"'$  (1.803 m)   Wt 266 lb 3.2 oz (120.7 kg)   SpO2 96%   BMI 37.13 kg/m     Wt Readings from Last 3 Encounters:  07/26/19 266 lb 3.2 oz (120.7 kg)  06/15/19  264 lb 6 oz (119.9 kg)  04/21/19 263 lb (119.3 kg)     GEN:  Well nourished, well developed in no acute distress HEENT: Normal NECK: No JVD; No carotid bruits LYMPHATICS: No lymphadenopathy CARDIAC: RRR, no murmurs, rubs, gallops RESPIRATORY:  Clear to auscultation without rales, wheezing or rhonchi  ABDOMEN: Soft, non-tender, non-distended MUSCULOSKELETAL:  No edema; No deformity  SKIN: Warm and dry NEUROLOGIC:  Alert and oriented x 3 PSYCHIATRIC:  Normal affect   ASSESSMENT:    1. Coronary artery disease involving native coronary artery of native heart without angina pectoris   2. Essential hypertension   3. Pure hypercholesterolemia    PLAN:    In order of problems listed above:  1.  ASCAD -s/p NSTEMI 2001 with PCI of the mid and distal RCA with 50-60% prox LAD and subsequent PCI of 80% LAD and right PDA. -denies any angina -continue ASA '81mg'$  daily, BB and statin  2.  HTN -BP controlled on exam -he has had some problems with orthostatic hypotension and BPs in the low 100's -stop Zestoretic and start Lisiniopril '20mg'$  daily -continue Carvedilol 6.'25mg'$  BID -check BP daily for a week and all with results -Creatinine was stable at 1.09 last month  3.  HLD -LDL goal is < 70 -LDL 47 last month -continue Vytorin 10-'10mg'$  daily   Medication Adjustments/Labs and Tests Ordered: Current medicines are reviewed at length with the patient today.  Concerns regarding medicines are outlined above.  Orders Placed This Encounter  Procedures  . EKG 12-Lead   No orders of the defined types were placed in this encounter.   Signed, Fransico Him, MD  07/26/2019 9:11 AM    [place]

## 2019-07-26 ENCOUNTER — Encounter: Payer: Self-pay | Admitting: Cardiology

## 2019-07-26 ENCOUNTER — Other Ambulatory Visit: Payer: Self-pay

## 2019-07-26 ENCOUNTER — Ambulatory Visit (INDEPENDENT_AMBULATORY_CARE_PROVIDER_SITE_OTHER): Payer: Medicare Other | Admitting: Cardiology

## 2019-07-26 VITALS — BP 112/70 | HR 56 | Ht 71.0 in | Wt 266.2 lb

## 2019-07-26 DIAGNOSIS — E7800 Pure hypercholesterolemia, unspecified: Secondary | ICD-10-CM | POA: Diagnosis not present

## 2019-07-26 DIAGNOSIS — I10 Essential (primary) hypertension: Secondary | ICD-10-CM

## 2019-07-26 DIAGNOSIS — I2510 Atherosclerotic heart disease of native coronary artery without angina pectoris: Secondary | ICD-10-CM | POA: Diagnosis not present

## 2019-07-26 MED ORDER — LISINOPRIL 20 MG PO TABS: 20.0000 mg | ORAL_TABLET | Freq: Every day | ORAL | 3 refills | Status: DC

## 2019-07-26 NOTE — Patient Instructions (Signed)
Medication Instructions:  Your physician has recommended you make the following change in your medication:  STOP Lisinopril/HCT START Lisinopril 20 mg once daily  *If you need a refill on your cardiac medications before your next appointment, please call your pharmacy*  Lab Work: None Ordered   Testing/Procedures: Take BP everyday 1-2 hours after you take your medication and call or send message to Dr. Radford Pax in 7-10 days with BP and pulse readings HOW TO TAKE YOUR BLOOD PRESSURE:  Rest 5 minutes before taking your blood pressure.   Don't smoke or drink caffeinated beverages for at least 30 minutes before.  Take your blood pressure before (not after) you eat.  Sit comfortably with your back supported and both feet on the floor (don't cross your legs).  Elevate your arm to heart level on a table or a desk.  Use the proper sized cuff. It should fit smoothly and snugly around your bare upper arm. There should be enough room to slip a fingertip under the cuff. The bottom edge of the cuff should be 1 inch above the crease of the elbow.  Ideally, take 3 measurements at one sitting and record the average.   Follow-Up: At [facility], you and your health needs are our priority.  As part of our continuing mission to provide you with exceptional heart care, we have created designated Provider Care Teams.  These Care Teams include your primary Cardiologist (physician) and Advanced Practice Providers (APPs -  Physician Assistants and Nurse Practitioners) who all work together to provide you with the care you need, when you need it.  Your next appointment:   12 months  The format for your next appointment:   Either In Person or Virtual  Provider:   You may see Fransico Him, MD or one of the following Advanced Practice Providers on your designated Care Team:    Melina Copa, PA-C  Ermalinda Barrios, PA-C

## 2019-08-18 DIAGNOSIS — R0609 Other forms of dyspnea: Secondary | ICD-10-CM

## 2019-08-18 DIAGNOSIS — I2510 Atherosclerotic heart disease of native coronary artery without angina pectoris: Secondary | ICD-10-CM

## 2019-08-24 NOTE — Telephone Encounter (Signed)
Called and spoke with patient. He states for a long while now, he has had an issue where when he walks vigorously, he gets SOB and it feels like there is a giant gas bubble in his chest. The episodes are just becoming more and more frequent and noticeable now.  If he slows his walking substantially, the SOB and "bubble" subside. Sometimes belching or passing gas helps also. He has no other symptoms and is asymptomatic now.  He specifically denies swelling/weight gain.   He mentioned switching from lisinopril/hct to plain lisinopril and wonders if that could have caused worsening symptoms.   Instructed the patient to take it easy today. Since he is asymptomatic, informed him Dr. Radford Pax will review and he will be called in the morning with her recommendations.  He will call if symptoms worsen. ED precautions reviewed.

## 2019-08-25 ENCOUNTER — Telehealth (HOSPITAL_COMMUNITY): Payer: Self-pay

## 2019-08-25 NOTE — Telephone Encounter (Signed)
Spoke with the patient, instructions given. He stated that he would be here for his test. Asked to call back with any questions. S.Khamila Bassinger EMTP 

## 2019-08-27 ENCOUNTER — Other Ambulatory Visit: Payer: Self-pay

## 2019-08-27 ENCOUNTER — Ambulatory Visit (HOSPITAL_COMMUNITY): Payer: Medicare Other | Attending: Cardiovascular Disease

## 2019-08-27 DIAGNOSIS — R0609 Other forms of dyspnea: Secondary | ICD-10-CM

## 2019-08-27 DIAGNOSIS — R0600 Dyspnea, unspecified: Secondary | ICD-10-CM | POA: Diagnosis not present

## 2019-08-27 DIAGNOSIS — I2510 Atherosclerotic heart disease of native coronary artery without angina pectoris: Secondary | ICD-10-CM | POA: Diagnosis not present

## 2019-08-27 LAB — MYOCARDIAL PERFUSION IMAGING
LV dias vol: 95 mL (ref 62–150)
LV sys vol: 41 mL
Peak HR: 75 {beats}/min
Rest HR: 62 {beats}/min
SDS: 3
SRS: 7
SSS: 12
TID: 0.99

## 2019-08-27 MED ORDER — TECHNETIUM TC 99M TETROFOSMIN IV KIT
31.0000 | PACK | Freq: Once | INTRAVENOUS | Status: AC | PRN
  Administered 2019-08-27: 31 via INTRAVENOUS
  Filled 2019-08-27: qty 31

## 2019-08-27 MED ORDER — REGADENOSON 0.4 MG/5ML IV SOLN
0.4000 mg | Freq: Once | INTRAVENOUS | Status: AC
  Administered 2019-08-27: 0.4 mg via INTRAVENOUS

## 2019-08-27 MED ORDER — TECHNETIUM TC 99M TETROFOSMIN IV KIT
10.3000 | PACK | Freq: Once | INTRAVENOUS | Status: AC | PRN
  Administered 2019-08-27: 10.3 via INTRAVENOUS
  Filled 2019-08-27: qty 11

## 2019-08-30 ENCOUNTER — Telehealth: Payer: Self-pay

## 2019-08-30 NOTE — Telephone Encounter (Signed)
-----   Message from Sueanne Margarita, MD sent at 08/28/2019  8:36 PM EST ----- Please let patient know that there is an area in his heart that appears to be scar but is larger than it was on prior studies.  I am concerned about this and given his symptoms I feel we need to proceed with left heart cath to redefine coronary anatomy. Please have him see extender to get set up for cath.

## 2019-08-30 NOTE — Telephone Encounter (Signed)
Notes recorded by Frederik Schmidt, RN on 08/30/2019 at 9:51 AM EST  The patient has been notified of the result and verbalized understanding. All questions (if any) were answered.  Frederik Schmidt, RN 08/30/2019 9:50 AM

## 2019-09-02 ENCOUNTER — Encounter: Payer: Self-pay | Admitting: Internal Medicine

## 2019-09-06 MED ORDER — PANTOPRAZOLE SODIUM 40 MG PO TBEC: 40.0000 mg | DELAYED_RELEASE_TABLET | Freq: Two times a day (BID) | ORAL | 3 refills | Status: DC

## 2019-09-12 ENCOUNTER — Other Ambulatory Visit: Payer: Self-pay | Admitting: Internal Medicine

## 2019-09-13 ENCOUNTER — Telehealth: Payer: Self-pay | Admitting: *Deleted

## 2019-09-13 MED ORDER — FAMOTIDINE 20 MG PO TABS: 20.0000 mg | ORAL_TABLET | Freq: Every day | ORAL | 3 refills | Status: DC

## 2019-09-13 NOTE — Telephone Encounter (Signed)
Follow Up  Advised patient of changes. Patient is ok with change and understands.

## 2019-09-13 NOTE — Telephone Encounter (Signed)
Call placed to pt re: appt 09/14/2019 that was scheduled with Melina Copa, PA-C.  Daniel Cruz is virtual all day that day and pt will need labs and EKG for cath.  I have rescheduled pt to see Truitt Merle, NP, in office, Wednesday, 09/15/2019 at 2:00, which pt will need to arrive at 1:45.  I have left a pt a message to call back, please advise pt if he calls back.

## 2019-09-14 NOTE — H&P (View-Only) (Signed)
CARDIOLOGY OFFICE NOTE  Date:  09/15/2019    Daniel Cruz Date of Birth: 1950/10/30 Medical Record #[id number]  PCP:  Colon Branch, MD  Cardiologist:  Radford Pax    Chief Complaint  Patient presents with  . Shortness of Breath  . Chest Pain    Post Myoview - seen for Dr. Radford Pax    History of Present Illness: Daniel Cruz is a 68 y.o. male who presents today for a work in [place] cath. Seen for Dr. Radford Pax.   He has a history of CAD with remote NSTEMI 2001 with PCI of the mid and distal RCA with 50-60% prox LAD and subsequent PCI of 80% LAD and right PDA 01/2009. Other issues include DM, HTN, HLD, GERD and OSA.   Seen here back in October by Dr. Radford Pax - felt to be doing well. His BP meds were changed.   He then sent in a My Chart message last month - noted SOB and a "bubble" sensation in his chest with walking - she recommended a Myoview - see below.   The patient does not have symptoms concerning for COVID-19 infection (fever, chills, cough, or new shortness of breath).   Comes in today. Here alone. He notes that he really has not felt well since his gall bladder surgery a year and a half ago - still with sour stomach. Some runny nose. Was started on anti-histamine - stomach actually settled down and was feeling ok. Then about a month ago - started having DOE/SOB with walking - also with considerable "pressure" coming up into the chest. He would belch/pass gas and feel better. Then would have occasional DOE with activities that he had no trouble with prior - now with walking in the parking lot to a store - never had this before - still with this pressure sensation as well. Symptoms always with exertion. None at rest.  A1C is under 7. He continues to refrain from tobacco. He understands the Myoview is abnormal and cardiac cath has been recommended.   Past Medical History:  Diagnosis Date  . Anemia, iron deficiency    hx  . CAD (coronary artery disease)     NSTEMI with  PCI of the mid and distal RCA with 50-60% LAD 09/2000, s/p cath 01/2009 with patent RCA stent and 90% LAD s/p PCI of LAD and right PDA  . Cholelithiasis   . DIABETES MELLITUS, TYPE II 02/26/2007  . Fatty liver 02/2009   per u/s  . GERD (gastroesophageal reflux disease)   . HTN (hypertension)   . Myocardial infarction ([place])    2001  . OSA (obstructive sleep apnea)    can't tol. a CPAP  . Plantar fasciitis of left foot   . PVC (premature ventricular contraction) 06/06/2017  . Sleep apnea   . UTI (lower urinary tract infection)    in the 80s and 11/09    Past Surgical History:  Procedure Laterality Date  . CARDIAC CATHETERIZATION  10/2008   stents  . CHOLECYSTECTOMY N/A 07/16/2016   Procedure: LAPAROSCOPIC CHOLECYSTECTOMY WITH INTRAOPERATIVE CHOLANGIOGRAM;  Surgeon: [name], MD;  Location: [facility];  Service: General;  Laterality: N/A;  . SEPTOPLASTY     w/ bilateral inferior turbinate reductions  . TONSILLECTOMY       Medications: Current Meds  Medication Sig  . aspirin EC 81 MG tablet Take 81 mg by mouth daily.  . blood glucose meter kit and supplies Dispense based on patient and insurance preference. Check  blood sugar once daily  . carvedilol (COREG) 6.25 MG tablet Take 1 tablet (6.25 mg total) by mouth 2 (two) times daily with a meal.  . dapagliflozin propanediol (FARXIGA) 10 MG TABS tablet Take 10 mg by mouth daily.  Marland Kitchen ezetimibe-simvastatin (VYTORIN) 10-10 MG tablet Take 1 tablet by mouth daily.  . famotidine (PEPCID) 20 MG tablet Take 1 tablet (20 mg total) by mouth at bedtime.  Marland Kitchen glipiZIDE (GLUCOTROL XL) 10 MG 24 hr tablet Take 1 tablet (10 mg total) by mouth daily with breakfast.  . ibuprofen (ADVIL,MOTRIN) 200 MG tablet Take 200 mg by mouth every 6 (six) hours as needed (For pain.).  Marland Kitchen linagliptin (TRADJENTA) 5 MG TABS tablet Take 1 tablet (5 mg total) by mouth daily.  Marland Kitchen lisinopril (ZESTRIL) 20 MG tablet Take 1 tablet (20 mg total) by mouth daily.  . Multiple Vitamin  (MULTIVITAMIN) tablet Take 1 tablet by mouth daily.    . nitroGLYCERIN (NITROSTAT) 0.4 MG SL tablet Place 1 tablet (0.4 mg total) under the tongue every 5 (five) minutes x 3 doses as needed for chest pain.  . pantoprazole (PROTONIX) 40 MG tablet Take 1 tablet (40 mg total) by mouth 2 (two) times daily before a meal.  . pioglitazone-metformin (ACTOPLUS MET) 15-850 MG tablet Take 1 tablet by mouth 2 (two) times daily with a meal.     Allergies: Allergies  Allergen Reactions  . Januvia [Sitagliptin] Nausea Only and Other (See Comments)    Back Pain    Social History: The patient  reports that he quit smoking about 19 years ago. He has a 24.00 pack-year smoking history. He has never used smokeless tobacco. He reports current alcohol use. He reports that he does not use drugs.   Family History: The patient's family history includes COPD in his mother; Coronary artery disease in his father; Emphysema in his mother; Stroke in his father.   Review of Systems: Please see the history of present illness.   All other systems are reviewed and negative.   Physical Exam: VS:  BP 122/82   Pulse 62   Ht 5' 11" (1.803 m)   Wt 260 lb 8 oz (118.2 kg)   SpO2 96%   BMI 36.33 kg/m  .  BMI Body mass index is 36.33 kg/m.  Wt Readings from Last 3 Encounters:  09/15/19 260 lb 8 oz (118.2 kg)  08/27/19 266 lb (120.7 kg)  07/26/19 266 lb 3.2 oz (120.7 kg)    General: Pleasant. Alert and in no acute distress.   HEENT: Normal.  Neck: Supple, no JVD, carotid bruits, or masses noted.  Cardiac: Regular rate and rhythm. No murmurs, rubs, or gallops. No edema.  Respiratory:  Lungs are clear to auscultation bilaterally with normal work of breathing.  GI: Soft and nontender.  MS: No deformity or atrophy. Gait and ROM intact.  Skin: Warm and dry. Color is normal.  Neuro:  Strength and sensation are intact and no gross focal deficits noted.  Psych: Alert, appropriate and with normal affect.   LABORATORY  DATA:  EKG:  EKG is ordered today. This demonstrates sinus brady - HR is 59 - no acute changes.  Lab Results  Component Value Date   WBC 4.6 06/15/2019   HGB 12.9 (L) 06/15/2019   HCT 39.2 06/15/2019   PLT 211.0 06/15/2019   GLUCOSE 84 06/15/2019   CHOL 110 06/15/2019   TRIG 106.0 06/15/2019   HDL 41.90 06/15/2019   LDLCALC 47 06/15/2019   ALT 15  06/15/2019   AST 21 06/15/2019   NA 138 06/15/2019   K 5.0 06/15/2019   CL 103 06/15/2019   CREATININE 1.09 06/15/2019   BUN 21 06/15/2019   CO2 27 06/15/2019   TSH 1.340 06/06/2017   PSA 1.40 06/15/2019   INR 1.05 07/14/2013   HGBA1C 6.9 (H) 06/15/2019   MICROALBUR 0.5 10/20/2009     BNP (last 3 results) No results for input(s): BNP in the last 8760 hours.  ProBNP (last 3 results) No results for input(s): PROBNP in the last 8760 hours.   Other Studies Reviewed Today:  Myoview Study Highlights 08/2019   Nuclear stress EF: 56%.  There was no ST segment deviation noted during stress.  Defect 1: There is a medium defect of severe severity present in the mid anteroseptal, apical septal, apical inferior and apex location.  Findings consistent with prior myocardial infarction in the distal LAD territory. However, there is hypokinesis of the inferior wall, which is inconsistent with the perfusion defect.  This is a low risk study.  The left ventricular ejection fraction is normal (55-65%).   Notes recorded by Sueanne Margarita, MD on 08/28/2019 at 8:36 PM EST  Please let patient know that there is an area in his heart that appears to be scar but is larger than it was on prior studies. I am concerned about this and given his symptoms I feel we need to proceed with left heart cath to redefine coronary anatomy. Please have him see extender to get set up for cath.   ASSESSMENT & PLAN:    1. CAD - prior remote NSTEMI 2001 - prior PCI to the mid and distal RCA, 50 to 60^ proximal LAD with subsequent PCI of the LAD and right  PDA - now with abnormal Myoview and exertional shortness of breath and chest pressure - cardiac catheterization has been recommended. The patient understands that risks include but are not limited to stroke (1 in 1000), death (1 in 31), kidney failure [usually temporary] (1 in 500), bleeding (1 in 200), allergic reaction [possibly serious] (1 in 200), and agrees to proceed. Will arrange for COVID testing. Lab today.   2. HTN - BP is good - has had recent med changes - HCTZ was stopped due to some dizziness - this is resolved and BP is ok.   3. HLD - on Vytorin  4. DM - A1C is good.   5. Former smoker.   6. COVID-19 Education: The signs and symptoms of COVID-19 were discussed with the patient and how to seek care for testing (follow up with PCP or arrange E-visit).  The importance of social distancing, staying at home, hand hygiene and wearing a mask when out in public were discussed today.  Current medicines are reviewed with the patient today.  The patient does not have concerns regarding medicines other than what has been noted above.  The following changes have been made:  See above.  Labs/ tests ordered today include:    Orders Placed This Encounter  Procedures  . Comprehensive metabolic panel  . CBC no Diff  . EKG 12-Lead     Disposition:   FU with me in a few weeks for post cath visit.   Patient is agreeable to this plan and will call if any problems develop in the interim.   SignedTruitt Merle, NP  09/15/2019 2:34 PM  [facility] [address] [place], [place]  [address] Phone: [phone]  Fax: [phone]

## 2019-09-14 NOTE — Progress Notes (Signed)
CARDIOLOGY OFFICE NOTE  Date:  09/15/2019    Daniel Cruz Date of Birth: 1951/01/28 Medical Record #[id number]  PCP:  Colon Branch, MD  Cardiologist:  Radford Pax    Chief Complaint  Patient presents with  . Shortness of Breath  . Chest Pain    Post Myoview - seen for Dr. Radford Pax    History of Present Illness: Daniel Cruz is a 68 y.o. male who presents today for a work in [place] cath. Seen for Dr. Radford Pax.   He has a history of CAD with remote NSTEMI 2001 with PCI of the mid and distal RCA with 50-60% prox LAD and subsequent PCI of 80% LAD and right PDA 01/2009. Other issues include DM, HTN, HLD, GERD and OSA.   Seen here back in October by Dr. Radford Pax - felt to be doing well. His BP meds were changed.   He then sent in a My Chart message last month - noted SOB and a "bubble" sensation in his chest with walking - she recommended a Myoview - see below.   The patient does not have symptoms concerning for COVID-19 infection (fever, chills, cough, or new shortness of breath).   Comes in today. Here alone. He notes that he really has not felt well since his gall bladder surgery a year and a half ago - still with sour stomach. Some runny nose. Was started on anti-histamine - stomach actually settled down and was feeling ok. Then about a month ago - started having DOE/SOB with walking - also with considerable "pressure" coming up into the chest. He would belch/pass gas and feel better. Then would have occasional DOE with activities that he had no trouble with prior - now with walking in the parking lot to a store - never had this before - still with this pressure sensation as well. Symptoms always with exertion. None at rest.  A1C is under 7. He continues to refrain from tobacco. He understands the Myoview is abnormal and cardiac cath has been recommended.   Past Medical History:  Diagnosis Date  . Anemia, iron deficiency    hx  . CAD (coronary artery disease)     NSTEMI with  PCI of the mid and distal RCA with 50-60% LAD 09/2000, s/p cath 01/2009 with patent RCA stent and 90% LAD s/p PCI of LAD and right PDA  . Cholelithiasis   . DIABETES MELLITUS, TYPE II 02/26/2007  . Fatty liver 02/2009   per u/s  . GERD (gastroesophageal reflux disease)   . HTN (hypertension)   . Myocardial infarction ([facility])    2001  . OSA (obstructive sleep apnea)    can't tol. a CPAP  . Plantar fasciitis of left foot   . PVC (premature ventricular contraction) 06/06/2017  . Sleep apnea   . UTI (lower urinary tract infection)    in the 80s and 11/09    Past Surgical History:  Procedure Laterality Date  . CARDIAC CATHETERIZATION  10/2008   stents  . CHOLECYSTECTOMY N/A 07/16/2016   Procedure: LAPAROSCOPIC CHOLECYSTECTOMY WITH INTRAOPERATIVE CHOLANGIOGRAM;  Surgeon: [name], MD;  Location: [facility];  Service: General;  Laterality: N/A;  . SEPTOPLASTY     w/ bilateral inferior turbinate reductions  . TONSILLECTOMY       Medications: Current Meds  Medication Sig  . aspirin EC 81 MG tablet Take 81 mg by mouth daily.  . blood glucose meter kit and supplies Dispense based on patient and insurance preference. Check  blood sugar once daily  . carvedilol (COREG) 6.25 MG tablet Take 1 tablet (6.25 mg total) by mouth 2 (two) times daily with a meal.  . dapagliflozin propanediol (FARXIGA) 10 MG TABS tablet Take 10 mg by mouth daily.  Marland Kitchen ezetimibe-simvastatin (VYTORIN) 10-10 MG tablet Take 1 tablet by mouth daily.  . famotidine (PEPCID) 20 MG tablet Take 1 tablet (20 mg total) by mouth at bedtime.  Marland Kitchen glipiZIDE (GLUCOTROL XL) 10 MG 24 hr tablet Take 1 tablet (10 mg total) by mouth daily with breakfast.  . ibuprofen (ADVIL,MOTRIN) 200 MG tablet Take 200 mg by mouth every 6 (six) hours as needed (For pain.).  Marland Kitchen linagliptin (TRADJENTA) 5 MG TABS tablet Take 1 tablet (5 mg total) by mouth daily.  Marland Kitchen lisinopril (ZESTRIL) 20 MG tablet Take 1 tablet (20 mg total) by mouth daily.  . Multiple Vitamin  (MULTIVITAMIN) tablet Take 1 tablet by mouth daily.    . nitroGLYCERIN (NITROSTAT) 0.4 MG SL tablet Place 1 tablet (0.4 mg total) under the tongue every 5 (five) minutes x 3 doses as needed for chest pain.  . pantoprazole (PROTONIX) 40 MG tablet Take 1 tablet (40 mg total) by mouth 2 (two) times daily before a meal.  . pioglitazone-metformin (ACTOPLUS MET) 15-850 MG tablet Take 1 tablet by mouth 2 (two) times daily with a meal.     Allergies: Allergies  Allergen Reactions  . Januvia [Sitagliptin] Nausea Only and Other (See Comments)    Back Pain    Social History: The patient  reports that he quit smoking about 19 years ago. He has a 24.00 pack-year smoking history. He has never used smokeless tobacco. He reports current alcohol use. He reports that he does not use drugs.   Family History: The patient's family history includes COPD in his mother; Coronary artery disease in his father; Emphysema in his mother; Stroke in his father.   Review of Systems: Please see the history of present illness.   All other systems are reviewed and negative.   Physical Exam: VS:  BP 122/82   Pulse 62   Ht 5' 11" (1.803 m)   Wt 260 lb 8 oz (118.2 kg)   SpO2 96%   BMI 36.33 kg/m  .  BMI Body mass index is 36.33 kg/m.  Wt Readings from Last 3 Encounters:  09/15/19 260 lb 8 oz (118.2 kg)  08/27/19 266 lb (120.7 kg)  07/26/19 266 lb 3.2 oz (120.7 kg)    General: Pleasant. Alert and in no acute distress.   HEENT: Normal.  Neck: Supple, no JVD, carotid bruits, or masses noted.  Cardiac: Regular rate and rhythm. No murmurs, rubs, or gallops. No edema.  Respiratory:  Lungs are clear to auscultation bilaterally with normal work of breathing.  GI: Soft and nontender.  MS: No deformity or atrophy. Gait and ROM intact.  Skin: Warm and dry. Color is normal.  Neuro:  Strength and sensation are intact and no gross focal deficits noted.  Psych: Alert, appropriate and with normal affect.   LABORATORY  DATA:  EKG:  EKG is ordered today. This demonstrates sinus brady - HR is 59 - no acute changes.  Lab Results  Component Value Date   WBC 4.6 06/15/2019   HGB 12.9 (L) 06/15/2019   HCT 39.2 06/15/2019   PLT 211.0 06/15/2019   GLUCOSE 84 06/15/2019   CHOL 110 06/15/2019   TRIG 106.0 06/15/2019   HDL 41.90 06/15/2019   LDLCALC 47 06/15/2019   ALT 15  06/15/2019   AST 21 06/15/2019   NA 138 06/15/2019   K 5.0 06/15/2019   CL 103 06/15/2019   CREATININE 1.09 06/15/2019   BUN 21 06/15/2019   CO2 27 06/15/2019   TSH 1.340 06/06/2017   PSA 1.40 06/15/2019   INR 1.05 07/14/2013   HGBA1C 6.9 (H) 06/15/2019   MICROALBUR 0.5 10/20/2009     BNP (last 3 results) No results for input(s): BNP in the last 8760 hours.  ProBNP (last 3 results) No results for input(s): PROBNP in the last 8760 hours.   Other Studies Reviewed Today:  Myoview Study Highlights 08/2019   Nuclear stress EF: 56%.  There was no ST segment deviation noted during stress.  Defect 1: There is a medium defect of severe severity present in the mid anteroseptal, apical septal, apical inferior and apex location.  Findings consistent with prior myocardial infarction in the distal LAD territory. However, there is hypokinesis of the inferior wall, which is inconsistent with the perfusion defect.  This is a low risk study.  The left ventricular ejection fraction is normal (55-65%).   Notes recorded by Sueanne Margarita, MD on 08/28/2019 at 8:36 PM EST  Please let patient know that there is an area in his heart that appears to be scar but is larger than it was on prior studies. I am concerned about this and given his symptoms I feel we need to proceed with left heart cath to redefine coronary anatomy. Please have him see extender to get set up for cath.   ASSESSMENT & PLAN:    1. CAD - prior remote NSTEMI 2001 - prior PCI to the mid and distal RCA, 50 to 60^ proximal LAD with subsequent PCI of the LAD and right  PDA - now with abnormal Myoview and exertional shortness of breath and chest pressure - cardiac catheterization has been recommended. The patient understands that risks include but are not limited to stroke (1 in 1000), death (1 in 27), kidney failure [usually temporary] (1 in 500), bleeding (1 in 200), allergic reaction [possibly serious] (1 in 200), and agrees to proceed. Will arrange for COVID testing. Lab today.   2. HTN - BP is good - has had recent med changes - HCTZ was stopped due to some dizziness - this is resolved and BP is ok.   3. HLD - on Vytorin  4. DM - A1C is good.   5. Former smoker.   6. COVID-19 Education: The signs and symptoms of COVID-19 were discussed with the patient and how to seek care for testing (follow up with PCP or arrange E-visit).  The importance of social distancing, staying at home, hand hygiene and wearing a mask when out in public were discussed today.  Current medicines are reviewed with the patient today.  The patient does not have concerns regarding medicines other than what has been noted above.  The following changes have been made:  See above.  Labs/ tests ordered today include:    Orders Placed This Encounter  Procedures  . Comprehensive metabolic panel  . CBC no Diff  . EKG 12-Lead     Disposition:   FU with me in a few weeks for post cath visit.   Patient is agreeable to this plan and will call if any problems develop in the interim.   SignedTruitt Merle, NP  09/15/2019 2:34 PM  [facility] [address] [place], [place]  [address] Phone: [phone]  Fax: [phone]        

## 2019-09-15 ENCOUNTER — Ambulatory Visit: Payer: Medicare Other | Admitting: Physician Assistant

## 2019-09-15 ENCOUNTER — Other Ambulatory Visit: Payer: Self-pay

## 2019-09-15 ENCOUNTER — Ambulatory Visit (INDEPENDENT_AMBULATORY_CARE_PROVIDER_SITE_OTHER): Payer: Medicare Other | Admitting: Nurse Practitioner

## 2019-09-15 ENCOUNTER — Encounter: Payer: Self-pay | Admitting: Nurse Practitioner

## 2019-09-15 VITALS — BP 122/82 | HR 62 | Ht 71.0 in | Wt 260.5 lb

## 2019-09-15 DIAGNOSIS — I2510 Atherosclerotic heart disease of native coronary artery without angina pectoris: Secondary | ICD-10-CM | POA: Diagnosis not present

## 2019-09-15 DIAGNOSIS — I10 Essential (primary) hypertension: Secondary | ICD-10-CM | POA: Diagnosis not present

## 2019-09-15 DIAGNOSIS — R0609 Other forms of dyspnea: Secondary | ICD-10-CM

## 2019-09-15 DIAGNOSIS — R9439 Abnormal result of other cardiovascular function study: Secondary | ICD-10-CM

## 2019-09-15 DIAGNOSIS — R0600 Dyspnea, unspecified: Secondary | ICD-10-CM | POA: Diagnosis not present

## 2019-09-15 LAB — COMPREHENSIVE METABOLIC PANEL
ALT: 18 IU/L (ref 0–44)
AST: 27 IU/L (ref 0–40)
Albumin/Globulin Ratio: 2 (ref 1.2–2.2)
Albumin: 4.4 g/dL (ref 3.8–4.8)
Alkaline Phosphatase: 64 IU/L (ref 39–117)
BUN/Creatinine Ratio: 19 (ref 10–24)
BUN: 21 mg/dL (ref 8–27)
Bilirubin Total: 0.2 mg/dL (ref 0.0–1.2)
CO2: 25 mmol/L (ref 20–29)
Calcium: 9.5 mg/dL (ref 8.6–10.2)
Chloride: 105 mmol/L (ref 96–106)
Creatinine, Ser: 1.1 mg/dL (ref 0.76–1.27)
GFR calc Af Amer: 78 mL/min/{1.73_m2} (ref >59)
GFR calc non Af Amer: 68 mL/min/{1.73_m2} (ref >59)
Globulin, Total: 2.2 g/dL (ref 1.5–4.5)
Glucose: 109 mg/dL — ABNORMAL HIGH (ref 65–99)
Potassium: 4.8 mmol/L (ref 3.5–5.2)
Sodium: 137 mmol/L (ref 134–144)
Total Protein: 6.6 g/dL (ref 6.0–8.5)

## 2019-09-15 LAB — CBC
Hematocrit: 37.5 % (ref 37.5–51.0)
Hemoglobin: 12.7 g/dL — ABNORMAL LOW (ref 13.0–17.7)
MCH: 31.5 pg (ref 26.6–33.0)
MCHC: 33.9 g/dL (ref 31.5–35.7)
MCV: 93 fL (ref 79–97)
Platelets: 215 10*3/uL (ref 150–450)
RBC: 4.03 x10E6/uL — ABNORMAL LOW (ref 4.14–5.80)
RDW: 14.1 % (ref 11.6–15.4)
WBC: 6.2 10*3/uL (ref 3.4–10.8)

## 2019-09-15 NOTE — Patient Instructions (Signed)
After Visit Summary:  We will be checking the following labs today - CMET & CBC   Medication Instructions:    Continue with your current medicines.    If you need a refill on your cardiac medications before your next appointment, please call your pharmacy.     Testing/Procedures To Be Arranged:  Cardiac catheterization   Follow-Up:   See me in about 3 to 4 weeks for post cath visit.     At [facility], you and your health needs are our priority.  As part of our continuing mission to provide you with exceptional heart care, we have created designated Provider Care Teams.  These Care Teams include your primary Cardiologist (physician) and Advanced Practice Providers (APPs -  Physician Assistants and Nurse Practitioners) who all work together to provide you with the care you need, when you need it.  Special Instructions:  . Stay safe, stay home, wash your hands for at least 20 seconds and wear a mask when out in public.  . It was good to talk with you today.   Your provider has recommended a cardiac catherization.   COVID SCREENING INFORMATION: You are scheduled for your COVID screening on Friday, December 11th at __________________ [facility] ([facility]) [address] Stay in the RIGHT lane and proceed under the brick awning (NOT the tent) and tell them you are there for pre-procedure testing. Do NOT bring any pets with you to the testing site  You are scheduled for a cardiac catheterization on Monday, December 14th at 7:30 AM with Dr. Angelena Form or associate.  Please arrive at the [facility] (Main Entrance) at [facility] at [address] [place] Stay on Monday, December 14th at 5:30 AM.  Free valet parking is available.   You are allowed only one visitor in the hospital with you. Both you and your visitor must wear masks.    Special note: Every effort is made to have your procedure done on time.   Please  understand that emergencies sometimes delay a scheduled   procedure.  No food or drink after midnight on Sunday  You may take your morning medications with a sip of water on the day of your procedure.  Please take a baby aspirin (81 mg) on the morning of your procedure.   Medications to HOLD - Farxiga, Glucotrol, Tradjenta the morning of your cath. Hold your ActoplusMet the day prior and for 48 hours afterwards.   Plan for a one night stay -- bring personal belongings.  Bring a current list of your medications and current insurance cards.  You MUST have a responsible person to drive you home. Someone MUST be with you the first 24 hours after you arrive home or your discharge will be delayed. Wear clothes that are easy to get on and off and wear slip on shoes.    Coronary Angiogram A coronary angiogram, also called coronary angiography, is an X-ray procedure used to look at the arteries in the heart. In this procedure, a dye (contrast dye) is injected through a long, hollow tube (catheter). The catheter is about the size of a piece of cooked spaghetti and is inserted through your groin, wrist, or arm. The dye is injected into each artery, and X-rays are then taken to show if there is a blockage in the arteries of your heart.  LET [facility] CARE PROVIDER KNOW ABOUT:  Any allergies you have, including allergies  to shellfish or contrast dye.    All medicines you are taking, including vitamins, herbs, eye drops, creams, and over-the-counter medicines.    Previous problems you or members of your family have had with the use of anesthetics.    Any blood disorders you have.    Previous surgeries you have had.  History of kidney problems or failure.    Other medical conditions you have.  RISKS AND COMPLICATIONS  Generally, a coronary angiogram is a safe procedure. However, about 1 person out of 1000 can have problems that may include:  Allergic reaction to the dye.   Bleeding/bruising from the access site or other locations.  Kidney injury, especially in people with impaired kidney function.   Stroke (rare).  Heart attack (rare).  Irregular rhythms (rare)  Death (rare)  BEFORE THE PROCEDURE   Do not eat or drink anything after midnight the night before the procedure or as directed by your health care provider.    Ask your health care provider about changing or stopping your regular medicines. This is especially important if you are taking diabetes medicines or blood thinners.  PROCEDURE  You may be given a medicine to help you relax (sedative) before the procedure. This medicine is given through an intravenous (IV) access tube that is inserted into one of your veins.    The area where the catheter will be inserted will be washed and shaved. This is usually done in the groin but may be done in the fold of your arm (near your elbow) or in the wrist.     A medicine will be given to numb the area where the catheter will be inserted (local anesthetic).    The health care provider will insert the catheter into an artery. The catheter will be guided by using a special type of X-ray (fluoroscopy) of the blood vessel being examined.    A special dye will then be injected into the catheter, and X-rays will be taken. The dye will help to show where any narrowing or blockages are located in the heart arteries.     AFTER THE PROCEDURE   If the procedure is done through the leg, you will be kept in bed lying flat for several hours. You will be instructed to not bend or cross your legs.  The insertion site will be checked frequently.    The pulse in your feet or wrist will be checked frequently.    Additional blood tests, X-rays, and an electrocardiogram may be done.       Call the [facility] office at [phone] if you have any questions, problems or concerns.

## 2019-09-16 ENCOUNTER — Encounter: Payer: Self-pay | Admitting: Internal Medicine

## 2019-09-16 ENCOUNTER — Ambulatory Visit (INDEPENDENT_AMBULATORY_CARE_PROVIDER_SITE_OTHER): Payer: Medicare Other | Admitting: Internal Medicine

## 2019-09-16 ENCOUNTER — Telehealth: Payer: Self-pay | Admitting: *Deleted

## 2019-09-16 ENCOUNTER — Other Ambulatory Visit: Payer: Self-pay

## 2019-09-16 VITALS — BP 135/72 | HR 57 | Temp 96.9°F | Resp 18 | Ht 71.0 in | Wt 261.2 lb

## 2019-09-16 DIAGNOSIS — I10 Essential (primary) hypertension: Secondary | ICD-10-CM

## 2019-09-16 DIAGNOSIS — E118 Type 2 diabetes mellitus with unspecified complications: Secondary | ICD-10-CM | POA: Diagnosis not present

## 2019-09-16 DIAGNOSIS — I2510 Atherosclerotic heart disease of native coronary artery without angina pectoris: Secondary | ICD-10-CM | POA: Diagnosis not present

## 2019-09-16 NOTE — Patient Instructions (Signed)
  GO TO THE FRONT DESK Schedule your next appointment   For a check up in 4 months   Check the  blood pressure regulalrly  BP GOAL is between 110/65 and  135/85. If it is consistently higher or lower, let me know

## 2019-09-16 NOTE — Progress Notes (Signed)
Subjective:    Patient ID: Daniel Cruz, male    DOB: 07-23-1951, 68 y.o.   MRN: [id number]  DOS:  09/16/2019 Type of visit - description: Routine visit CAD: Since the last visit saw cardiology due to Everetts and some chest tightness, a stress test was done and now he is scheduled for a cath.  Currently asymptomatic DM: Excellent ambulatory CBGs Dizziness: The patient used to get slightly dizzy when he bends over, recently cardiology stop HCTZ, BP remains controlled but dizziness is gone  Wt Readings from Last 3 Encounters:  09/16/19 261 lb 4 oz (118.5 kg)  09/15/19 260 lb 8 oz (118.2 kg)  08/27/19 266 lb (120.7 kg)     Review of Systems Denies lower extremity edema or palpitations  Past Medical History:  Diagnosis Date  . Anemia, iron deficiency    hx  . CAD (coronary artery disease)     NSTEMI with PCI of the mid and distal RCA with 50-60% LAD 09/2000, s/p cath 01/2009 with patent RCA stent and 90% LAD s/p PCI of LAD and right PDA  . Cholelithiasis   . DIABETES MELLITUS, TYPE II 02/26/2007  . Fatty liver 02/2009   per u/s  . GERD (gastroesophageal reflux disease)   . HTN (hypertension)   . Myocardial infarction ([facility])    2001  . OSA (obstructive sleep apnea)    can't tol. a CPAP  . Plantar fasciitis of left foot   . PVC (premature ventricular contraction) 06/06/2017  . Sleep apnea   . UTI (lower urinary tract infection)    in the 80s and 11/09    Past Surgical History:  Procedure Laterality Date  . CARDIAC CATHETERIZATION  10/2008   stents  . CHOLECYSTECTOMY N/A 07/16/2016   Procedure: LAPAROSCOPIC CHOLECYSTECTOMY WITH INTRAOPERATIVE CHOLANGIOGRAM;  Surgeon: [name], MD;  Location: [facility];  Service: General;  Laterality: N/A;  . SEPTOPLASTY     w/ bilateral inferior turbinate reductions  . TONSILLECTOMY      Social History   Socioeconomic History  . Marital status: Married    Spouse name: Not on file  . Number of children: 2  . Years of education:  Not on file  . Highest education level: Not on file  Occupational History  . 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 Smoking status: Former Smoker    Packs/day: 1.50    Years: 16.00    Pack years: 24.00    Quit date: 07/08/2000    Years since quitting: 19.2  . Smokeless tobacco: Never Used  . Tobacco comment: 2001  Substance and Sexual Activity  . Alcohol use: Yes    Comment: socially  . Drug use: No  . Sexual activity: Yes  Other Topics Concern  . Not on file  Social History Narrative   Lives in [place], [place]   Lives w/ wife, 2 children, 2 g-kids   Retired April 2017          Social Determinants of Health   Financial Resource Strain:   . Difficulty of Paying Living Expenses: Not on file  Food Insecurity:   . Worried About Charity fundraiser in the Last Year: Not on file  . Ran Out of Food in the Last Year: Not on file  Transportation Needs:   . Lack of Transportation (Medical): Not on file  . Lack of Transportation (Non-Medical): Not on file  Physical Activity:   . Days  of Exercise per Week: Not on file  . Minutes of Exercise per Session: Not on file  Stress:   . Feeling of Stress : Not on file  Social Connections:   . Frequency of Communication with Friends and Family: Not on file  . Frequency of Social Gatherings with Friends and Family: Not on file  . Attends Religious Services: Not on file  . Active Member of Clubs or Organizations: Not on file  . Attends Archivist Meetings: Not on file  . Marital Status: Not on file  Intimate Partner Violence:   . Fear of Current or Ex-Partner: Not on file  . Emotionally Abused: Not on file  . Physically Abused: Not on file  . Sexually Abused: Not on file      Allergies as of 09/16/2019      Reactions   Januvia [sitagliptin] Nausea Only, Other (See Comments)   Back Pain      Medication List       Accurate as of September 16, 2019 11:59  PM. If you have any questions, ask your nurse or doctor.        aspirin EC 81 MG tablet Take 81 mg by mouth at bedtime.   blood glucose meter kit and supplies Dispense based on patient and insurance preference. Check blood sugar once daily   carvedilol 6.25 MG tablet Commonly known as: COREG Take 1 tablet (6.25 mg total) by mouth 2 (two) times daily with a meal.   cetirizine 10 MG tablet Commonly known as: ZYRTEC Take 10 mg by mouth daily.   ezetimibe-simvastatin 10-10 MG tablet Commonly known as: VYTORIN Take 1 tablet by mouth daily.   famotidine 20 MG tablet Commonly known as: Pepcid Take 1 tablet (20 mg total) by mouth at bedtime.   Farxiga 10 MG Tabs tablet Generic drug: dapagliflozin propanediol Take 10 mg by mouth daily.   glipiZIDE 10 MG 24 hr tablet Commonly known as: GLUCOTROL XL Take 1 tablet (10 mg total) by mouth daily with breakfast. What changed: when to take this   ibuprofen 200 MG tablet Commonly known as: ADVIL Take 200 mg by mouth every 6 (six) hours as needed (For pain.).   linagliptin 5 MG Tabs tablet Commonly known as: Tradjenta Take 1 tablet (5 mg total) by mouth daily. What changed: when to take this   lisinopril 20 MG tablet Commonly known as: ZESTRIL Take 1 tablet (20 mg total) by mouth daily.   multivitamin tablet Take 1 tablet by mouth daily.   nitroGLYCERIN 0.4 MG SL tablet Commonly known as: NITROSTAT Place 1 tablet (0.4 mg total) under the tongue every 5 (five) minutes x 3 doses as needed for chest pain.   pantoprazole 40 MG tablet Commonly known as: PROTONIX Take 1 tablet (40 mg total) by mouth 2 (two) times daily before a meal. What changed: when to take this   pioglitazone-metformin 15-850 MG tablet Commonly known as: ACTOPLUS MET Take 1 tablet by mouth 2 (two) times daily with a meal.   Potassium 99 MG Tabs Take 99 mg by mouth at bedtime.           Objective:   Physical Exam BP 135/72 (BP Location: Left Arm,  Patient Position: Sitting, Cuff Size: Normal)   Pulse (!) 57   Temp (!) 96.9 F (36.1 C) (Temporal)   Resp 18   Ht _0  (1.803 m)   Wt 261 lb 4 oz (118.5 kg)   SpO2 99%   BMI 36.44  kg/m  General:   Well developed, NAD, BMI noted. HEENT:  Normocephalic . Face symmetric, atraumatic Lungs:  CTA B Normal respiratory effort, no intercostal retractions, no accessory muscle use. Heart: RRR,  no murmur.  No pretibial edema bilaterally  Skin: Not pale. Not jaundice Neurologic:  alert & oriented X3.  Speech normal, gait appropriate for age and unassisted Psych--  Cognition and judgment appear intact.  Cooperative with normal attention span and concentration.  Behavior appropriate. No anxious or depressed appearing.      Assessment     Assessment DM DX 2008 HTN Hyperlipidemia CAD:  -MI 2001, syncope 2010 -07-2017: SOB, low risk stress test OSA, CPAP intolerant ED Mild chronic anemia, low iron stores. Saw GI 04-2017: no further w/u  GERD Gallbladder polyps, 2014, surgery 07-2016 Fatty liver 2010 Used to see  Dr Allyson Sabal  PLAN YP:GATAOOLD Wilder Glade, Glucotrol, Tradjenta, Actos plus met.   He did check his sugars consistently since the last visit and all numbers except one reading were within his goal so he quit checking.  He is has modified his diet, doing portion control.  Encouraged to continue doing that and once he is cleared by cardiology to go back to routine exercise. We will try to get her A1c from the blood work done yesterday, if unable to will check when he comes back. HTN: HCTZ discontinue by cardiology, currently on Zestril.  Ambulatory BP control it.  No change. CAD: Since the last visit, saw cardiology, stress test show a defect, to have a cardiac catheterization next week. RTC for months   This visit occurred during the SARS-CoV-2 public health emergency.  Safety protocols were in place, including screening questions prior to the visit, additional usage of staff  PPE, and extensive cleaning of exam room while observing appropriate contact time as indicated for disinfecting solutions.

## 2019-09-16 NOTE — Telephone Encounter (Signed)
Pt contacted pre-catheterization scheduled at [facility] for: Monday September 20, 2019 7:30 AM Verified arrival time and place: [facility]) at: 5:30 AM   No solid food after midnight prior to cath, clear liquids until 5 AM day of procedure. Contrast allergy: no  Hold: Farxiga-AM of procedure. Glipizide-AM of procedure. Tradjenta-AM of procedure. ActosplusMet-day of procedure and 48 hours post procedure.   Except hold medications AM meds can be  taken pre-cath with sip of water including: ASA 81 mg   Confirmed patient has responsible adult to drive home post procedure and observe 24 hours after arriving home: yes  Currently, due to Covid-19 pandemic, only one support person will be allowed with patient. Must be the same support person for that patient's entire stay, will be screened and required to wear a mask. They will be asked to wait in the waiting room for the duration of the patient's stay.  Patients are required to wear a mask when they enter the hospital.      COVID-19 Pre-Screening Questions:  . In the past 7 to 10 days have you had a cough,  shortness of breath, headache, congestion, fever (100 or greater) body aches, chills, sore throat, or sudden loss of taste or sense of smell? Shortness of breath 3 weeks . Have you been around anyone with known Covid 19? no . Have you been around anyone who is awaiting Covid 19 test results in the past 7 to 10 days? no . Have you been around anyone who has been exposed to Covid 19, or has mentioned symptoms of Covid 19 within the past 7 to 10 days? no   I reviewed procedure/mask/visitor instructions, Covid-19 screening questions with patient, he verbalized understanding, thanked me for call.

## 2019-09-16 NOTE — Progress Notes (Signed)
Pre visit review using our clinic review tool, if applicable. No additional management support is needed unless otherwise documented below in the visit note. 

## 2019-09-17 ENCOUNTER — Other Ambulatory Visit (HOSPITAL_COMMUNITY)
Admission: RE | Admit: 2019-09-17 | Discharge: 2019-09-17 | Disposition: A | Discharge status: Home / Self Care | Payer: Medicare Other | Source: Ambulatory Visit | Attending: Cardiovascular Disease | Admitting: Cardiovascular Disease

## 2019-09-17 ENCOUNTER — Other Ambulatory Visit: Payer: Self-pay | Admitting: Internal Medicine

## 2019-09-17 DIAGNOSIS — Z20828 Contact with and (suspected) exposure to other viral communicable diseases: Secondary | ICD-10-CM | POA: Insufficient documentation

## 2019-09-17 DIAGNOSIS — Z01812 Encounter for preprocedural laboratory examination: Secondary | ICD-10-CM | POA: Diagnosis present

## 2019-09-17 LAB — SARS CORONAVIRUS 2 (TAT 6-24 HRS): SARS Coronavirus 2: NEGATIVE

## 2019-09-19 NOTE — Assessment & Plan Note (Signed)
ID:[id number] Daniel Cruz, Glucotrol, Tradjenta, Actos plus met.   He did check his sugars consistently since the last visit and all numbers except one reading were within his goal so he quit checking.  He is has modified his diet, doing portion control.  Encouraged to continue doing that and once he is cleared by cardiology to go back to routine exercise. We will try to get her A1c from the blood work done yesterday, if unable to will check when he comes back. HTN: HCTZ discontinue by cardiology, currently on Zestril.  Ambulatory BP control it.  No change. CAD: Since the last visit, saw cardiology, stress test show a defect, to have a cardiac catheterization next week. RTC for months

## 2019-09-20 ENCOUNTER — Encounter: Payer: Self-pay | Admitting: Cardiology

## 2019-09-20 ENCOUNTER — Other Ambulatory Visit: Payer: Self-pay

## 2019-09-20 ENCOUNTER — Ambulatory Visit (HOSPITAL_COMMUNITY)
Admission: RE | Admit: 2019-09-20 | Discharge: 2019-09-20 | Disposition: A | Discharge status: Home / Self Care | Payer: Medicare Other | Attending: Cardiovascular Disease | Admitting: Cardiovascular Disease

## 2019-09-20 ENCOUNTER — Encounter (HOSPITAL_COMMUNITY)
Admission: RE | Disposition: A | Discharge status: Home / Self Care | Payer: Self-pay | Source: Home / Self Care | Attending: Cardiovascular Disease

## 2019-09-20 DIAGNOSIS — Z955 Presence of coronary angioplasty implant and graft: Secondary | ICD-10-CM

## 2019-09-20 DIAGNOSIS — Z7902 Long term (current) use of antithrombotics/antiplatelets: Secondary | ICD-10-CM | POA: Insufficient documentation

## 2019-09-20 DIAGNOSIS — K219 Gastro-esophageal reflux disease without esophagitis: Secondary | ICD-10-CM | POA: Diagnosis not present

## 2019-09-20 DIAGNOSIS — Z87891 Personal history of nicotine dependence: Secondary | ICD-10-CM | POA: Diagnosis not present

## 2019-09-20 DIAGNOSIS — E785 Hyperlipidemia, unspecified: Secondary | ICD-10-CM | POA: Insufficient documentation

## 2019-09-20 DIAGNOSIS — I25110 Atherosclerotic heart disease of native coronary artery with unstable angina pectoris: Secondary | ICD-10-CM | POA: Diagnosis not present

## 2019-09-20 DIAGNOSIS — I252 Old myocardial infarction: Secondary | ICD-10-CM | POA: Diagnosis not present

## 2019-09-20 DIAGNOSIS — Z7982 Long term (current) use of aspirin: Secondary | ICD-10-CM | POA: Insufficient documentation

## 2019-09-20 DIAGNOSIS — I10 Essential (primary) hypertension: Secondary | ICD-10-CM | POA: Diagnosis not present

## 2019-09-20 DIAGNOSIS — Z79899 Other long term (current) drug therapy: Secondary | ICD-10-CM | POA: Diagnosis not present

## 2019-09-20 DIAGNOSIS — G4733 Obstructive sleep apnea (adult) (pediatric): Secondary | ICD-10-CM | POA: Diagnosis not present

## 2019-09-20 DIAGNOSIS — E119 Type 2 diabetes mellitus without complications: Secondary | ICD-10-CM | POA: Insufficient documentation

## 2019-09-20 DIAGNOSIS — I2510 Atherosclerotic heart disease of native coronary artery without angina pectoris: Secondary | ICD-10-CM

## 2019-09-20 DIAGNOSIS — I200 Unstable angina: Secondary | ICD-10-CM | POA: Diagnosis present

## 2019-09-20 HISTORY — PX: LEFT HEART CATH AND CORONARY ANGIOGRAPHY: CATH118249

## 2019-09-20 HISTORY — PX: CORONARY STENT INTERVENTION: CATH118234

## 2019-09-20 LAB — GLUCOSE, CAPILLARY: Glucose-Capillary: 118 mg/dL — ABNORMAL HIGH (ref 70–99)

## 2019-09-20 LAB — POCT ACTIVATED CLOTTING TIME
Activated Clotting Time: 230 seconds
Activated Clotting Time: 290 seconds

## 2019-09-20 SURGERY — LEFT HEART CATH AND CORONARY ANGIOGRAPHY
Anesthesia: LOCAL

## 2019-09-20 MED ORDER — VERAPAMIL HCL 2.5 MG/ML IV SOLN
INTRAVENOUS | Status: DC | PRN
  Administered 2019-09-20: 08:00:00 10 mL via INTRA_ARTERIAL

## 2019-09-20 MED ORDER — MIDAZOLAM HCL 2 MG/2ML IJ SOLN
INTRAMUSCULAR | Status: DC | PRN
  Administered 2019-09-20 (×2): 1 mg via INTRAVENOUS

## 2019-09-20 MED ORDER — HYDRALAZINE HCL 20 MG/ML IJ SOLN: 10.0000 mg | INTRAMUSCULAR | Status: DC | PRN

## 2019-09-20 MED ORDER — HEPARIN (PORCINE) IN NACL 1000-0.9 UT/500ML-% IV SOLN
INTRAVENOUS | Status: AC
  Filled 2019-09-20: qty 1000

## 2019-09-20 MED ORDER — SODIUM CHLORIDE 0.9% FLUSH: 3.0000 mL | Freq: Two times a day (BID) | INTRAVENOUS | Status: DC

## 2019-09-20 MED ORDER — MIDAZOLAM HCL 2 MG/2ML IJ SOLN
INTRAMUSCULAR | Status: AC
  Filled 2019-09-20: qty 2

## 2019-09-20 MED ORDER — NITROGLYCERIN 0.4 MG SL SUBL: 0.4000 mg | SUBLINGUAL_TABLET | SUBLINGUAL | Status: DC | PRN

## 2019-09-20 MED ORDER — HEPARIN SODIUM (PORCINE) 1000 UNIT/ML IJ SOLN
INTRAMUSCULAR | Status: AC
  Filled 2019-09-20: qty 1

## 2019-09-20 MED ORDER — FAMOTIDINE IN NACL 20-0.9 MG/50ML-% IV SOLN
INTRAVENOUS | Status: AC
  Filled 2019-09-20: qty 50

## 2019-09-20 MED ORDER — SODIUM CHLORIDE 0.9 % IV SOLN: INTRAVENOUS | Status: DC

## 2019-09-20 MED ORDER — EZETIMIBE-SIMVASTATIN 10-10 MG PO TABS: 1.0000 | ORAL_TABLET | Freq: Every day | ORAL | Status: DC

## 2019-09-20 MED ORDER — CLOPIDOGREL BISULFATE 300 MG PO TABS
ORAL_TABLET | ORAL | Status: DC | PRN
  Administered 2019-09-20: 600 mg via ORAL

## 2019-09-20 MED ORDER — HEPARIN (PORCINE) IN NACL 1000-0.9 UT/500ML-% IV SOLN
INTRAVENOUS | Status: DC | PRN
  Administered 2019-09-20 (×2): 500 mL

## 2019-09-20 MED ORDER — HEPARIN (PORCINE) IN NACL 2000-0.9 UNIT/L-% IV SOLN
INTRAVENOUS | Status: AC
  Filled 2019-09-20: qty 1000

## 2019-09-20 MED ORDER — ACETAMINOPHEN 325 MG PO TABS: 650.0000 mg | ORAL_TABLET | ORAL | Status: DC | PRN

## 2019-09-20 MED ORDER — SODIUM CHLORIDE 0.9% FLUSH: 3.0000 mL | INTRAVENOUS | Status: DC | PRN

## 2019-09-20 MED ORDER — PANTOPRAZOLE SODIUM 40 MG PO TBEC: 40.0000 mg | DELAYED_RELEASE_TABLET | Freq: Two times a day (BID) | ORAL | Status: DC

## 2019-09-20 MED ORDER — ONDANSETRON HCL 4 MG/2ML IJ SOLN: 4.0000 mg | Freq: Four times a day (QID) | INTRAMUSCULAR | Status: DC | PRN

## 2019-09-20 MED ORDER — CLOPIDOGREL BISULFATE 300 MG PO TABS
ORAL_TABLET | ORAL | Status: AC
  Filled 2019-09-20: qty 2

## 2019-09-20 MED ORDER — HEPARIN SODIUM (PORCINE) 1000 UNIT/ML IJ SOLN
INTRAMUSCULAR | Status: DC | PRN
  Administered 2019-09-20 (×2): 6000 [IU] via INTRAVENOUS
  Administered 2019-09-20: 3000 [IU] via INTRAVENOUS

## 2019-09-20 MED ORDER — GLIPIZIDE ER 10 MG PO TB24: 10.0000 mg | ORAL_TABLET | Freq: Every day | ORAL | Status: DC

## 2019-09-20 MED ORDER — VERAPAMIL HCL 2.5 MG/ML IV SOLN
INTRAVENOUS | Status: AC
  Filled 2019-09-20: qty 2

## 2019-09-20 MED ORDER — IOHEXOL 350 MG/ML SOLN
INTRAVENOUS | Status: DC | PRN
  Administered 2019-09-20: 09:00:00 170 mL via INTRA_ARTERIAL

## 2019-09-20 MED ORDER — SODIUM CHLORIDE 0.9 % IV SOLN: 250.0000 mL | INTRAVENOUS | Status: DC | PRN

## 2019-09-20 MED ORDER — CLOPIDOGREL BISULFATE 75 MG PO TABS: 75.0000 mg | ORAL_TABLET | Freq: Every day | ORAL | Status: DC

## 2019-09-20 MED ORDER — CARVEDILOL 3.125 MG PO TABS: 6.2500 mg | ORAL_TABLET | Freq: Two times a day (BID) | ORAL | Status: DC

## 2019-09-20 MED ORDER — LISINOPRIL 10 MG PO TABS: 20.0000 mg | ORAL_TABLET | Freq: Every day | ORAL | Status: DC

## 2019-09-20 MED ORDER — ASPIRIN 81 MG PO CHEW: 81.0000 mg | CHEWABLE_TABLET | ORAL | Status: DC

## 2019-09-20 MED ORDER — LINAGLIPTIN 5 MG PO TABS: 5.0000 mg | ORAL_TABLET | Freq: Every evening | ORAL | Status: DC

## 2019-09-20 MED ORDER — FENTANYL CITRATE (PF) 100 MCG/2ML IJ SOLN
INTRAMUSCULAR | Status: DC | PRN
  Administered 2019-09-20: 50 ug via INTRAVENOUS
  Administered 2019-09-20: 25 ug via INTRAVENOUS

## 2019-09-20 MED ORDER — LABETALOL HCL 5 MG/ML IV SOLN: 10.0000 mg | INTRAVENOUS | Status: DC | PRN

## 2019-09-20 MED ORDER — LIDOCAINE HCL (PF) 1 % IJ SOLN
INTRAMUSCULAR | Status: DC | PRN
  Administered 2019-09-20: 2 mL

## 2019-09-20 MED ORDER — LIDOCAINE HCL (PF) 1 % IJ SOLN
INTRAMUSCULAR | Status: AC
  Filled 2019-09-20: qty 30

## 2019-09-20 MED ORDER — FAMOTIDINE IN NACL 20-0.9 MG/50ML-% IV SOLN
INTRAVENOUS | Status: AC | PRN
  Administered 2019-09-20: 20 mg via INTRAVENOUS

## 2019-09-20 MED ORDER — SODIUM CHLORIDE 0.9 % IV SOLN
INTRAVENOUS | Status: DC
  Administered 2019-09-20: 06:00:00 via INTRAVENOUS

## 2019-09-20 MED ORDER — FAMOTIDINE 20 MG PO TABS: 20.0000 mg | ORAL_TABLET | Freq: Every day | ORAL | Status: DC

## 2019-09-20 MED ORDER — FENTANYL CITRATE (PF) 100 MCG/2ML IJ SOLN
INTRAMUSCULAR | Status: AC
  Filled 2019-09-20: qty 2

## 2019-09-20 MED ORDER — DIAZEPAM 5 MG PO TABS: 10.0000 mg | ORAL_TABLET | ORAL | Status: DC

## 2019-09-20 MED ORDER — ASPIRIN EC 81 MG PO TBEC: 81.0000 mg | DELAYED_RELEASE_TABLET | Freq: Every day | ORAL | Status: DC

## 2019-09-20 MED ORDER — CLOPIDOGREL BISULFATE 75 MG PO TABS: 75.0000 mg | ORAL_TABLET | Freq: Every day | ORAL | 1 refills | Status: DC

## 2019-09-20 MED FILL — CLOPIDOGREL 75 MG TABLET: 75 | 90 days supply | Qty: 90 | Fill #0

## 2019-09-20 SURGICAL SUPPLY — 19 items
BALLN SAPPHIRE 2.0X12 (BALLOONS) ×2
BALLOON SAPPHIRE 2.0X12 (BALLOONS) IMPLANT
CATH 5FR JL3.5 JR4 ANG PIG MP (CATHETERS) ×1 IMPLANT
CATH INFINITI 5FR AL1 (CATHETERS) ×1 IMPLANT
CATH VISTA GUIDE 6FR XBLAD3.5 (CATHETERS) ×1 IMPLANT
CATH VISTA GUIDE 6FR XBLAD4 (CATHETERS) ×1 IMPLANT
DEVICE RAD COMP TR BAND LRG (VASCULAR PRODUCTS) ×1 IMPLANT
GLIDESHEATH SLEND SS 6F .021 (SHEATH) ×1 IMPLANT
GUIDEWIRE INQWIRE 1.5J.035X260 (WIRE) IMPLANT
INQWIRE 1.5J .035X260CM (WIRE) ×2
KIT ENCORE 26 ADVANTAGE (KITS) ×1 IMPLANT
KIT HEART LEFT (KITS) ×2 IMPLANT
PACK CARDIAC CATHETERIZATION (CUSTOM PROCEDURE TRAY) ×2 IMPLANT
SHEATH PROBE COVER 6X72 (BAG) ×1 IMPLANT
STENT RESOLUTE ONYX 2.75X22 (Permanent Stent) ×1 IMPLANT
SYR MEDRAD MARK 7 150ML (SYRINGE) ×2 IMPLANT
TRANSDUCER W/STOPCOCK (MISCELLANEOUS) ×2 IMPLANT
TUBING CIL FLEX 10 FLL-RA (TUBING) ×2 IMPLANT
WIRE COUGAR XT STRL 190CM (WIRE) ×1 IMPLANT

## 2019-09-20 NOTE — Discharge Summary (Signed)
Discharge Summary for Same Day PCI   Patient ID: Daniel Cruz MRN: [id number]; DOB: 01-Apr-1951  Admit date: 09/20/2019 Discharge date: 09/20/2019  Primary Care Provider: Colon Branch, MD  Primary Cardiologist: Daniel Him, MD  Primary Electrophysiologist:  None   Discharge Diagnoses    Active Problems:   Unstable angina [facility])    Diagnostic Studies/Procedures    Cardiac Catheterization 09/20/2019:   Prox RCA lesion is 40% stenosed.  RPDA lesion is 30% stenosed.  Ramus lesion is 80% stenosed.  Mid Cx to Dist Cx lesion is 30% stenosed with 30% stenosed side Cruz in 2nd Mrg.  Mid LAD-1 lesion is 100% stenosed.  Mid LAD-2 lesion is 100% stenosed.  A drug-eluting stent was successfully placed using a STENT RESOLUTE ONYX [id number].  Post intervention, there is a 0% residual stenosis.  The left ventricular systolic function is normal.  LV end diastolic pressure is normal.  The left ventricular ejection fraction is 55-65% by visual estimate.  There is no mitral valve regurgitation.   1. Patent stents proximal and mid RCA with mild to moderate restenosis which does not appear to be flow limiting.  2. Chronic occlusion of the mid LAD. The mid and distal LAD fills from right to left collaterals.  3. Mild non-obstructive disease in the Circumflex artery 4. Severe stenosis in the moderate caliber intermediate Cruz.  5. Preserved LV systolic function 6. Successful PTCA/DES x 1 intermediate Cruz  Recommendations: Continue ASA and Plavix for at least 6 months. If he has continued dyspnea, could consider CTO PCI of the LAD. If this is the case, he would need to have his cath films reviewed by the CTO team. Same day PCI discharge.   Diagnostic Dominance: Right  Intervention    _____________   History of Present Illness     Daniel Cruz is a 68 y.o. male with a history of CAD with remote NSTEMI 2001 with PCI of the mid and distal RCA with 50-60%  prox LAD and subsequent PCI of 80% LAD and right PDA 01/2009. Other issues include DM, HTN, HLD, GERD and OSA.   Seen in the office back in October by Dr. Radford Cruz - felt to be doing well. His BP meds were changed.   He then sent in a My Chart message last month - noted SOB and a "bubble" sensation in his chest with walking - she recommended a Myoview. Study showed prior scarring in the distal LAD but noted to be larger than prior studies. It was recommended he follow up in the office to discuss cardiac cath.   He presented to the office on 12/9 and noted that he really had not felt well since his gall bladder surgery a year and a half ago - still with sour stomach. Some runny nose. Was started on anti-histamine - stomach actually settled down and was feeling ok. Then about a month ago - started having DOE/SOB with walking - also with considerable "pressure" coming up into the chest. He would belch/pass gas and feel better. Then would have occasional DOE with activities that he had no trouble with prior - now with walking in the parking lot to a store - never had this before - still with this pressure sensation as well. Symptoms always with exertion. None at rest.  A1C is under 7. He continues to refrain from tobacco. He understood the Myoview is abnormal and cardiac cath had been recommended.    Outpatient cardiac catheterization was arranged for further  evaluation.  Hospital Course     The patient underwent cardiac cath as noted above with patent stents in the p/mRCA with mild to moderate restenosis that was not flow limiting, CTO of the mLAD with collaterals, along with new severe stenosis in the intermediate Cruz. Successful PCI/DES x1. Plan for DAPT with ASA/plavic for at least 6 months. Noted if he continued to have dyspnea, then consider CTO PCI of the LAD. The patient was seen by cardiac rehab while in short stay. There were no observed complications post cath. Radial cath site was re-evaluated  prior to discharge and found to be stable without any complications. Discussed statins with patient, reports he has been on vytorin for a long time. Advised considering switch to high dose statin, he will consider. LDL 47. Also is on a combination pill of Actos and metformin which is prescribed via his PCP. Informed would be good to transition from Actos given his CAD. Consider Jardience as a an outpatient. He was open to switching and will plan to discuss with PCP.   Daniel Cruz was seen by Dr. Angelena Cruz and determined stable for discharge home. Follow up with our office has been arranged. Instructions/precautions regarding cath site care were given prior to discharge. Medications are listed below.  _____________  Did the patient have an acute coronary syndrome (MI, NSTEMI, STEMI, etc) this admission?:  No                               Did the patient have a percutaneous coronary intervention (stent / angioplasty)?:  Yes.     Cath/PCI Registry Performance & Quality Measures: 1. Aspirin prescribed? - Yes 2. ADP Receptor Inhibitor (Plavix/Clopidogrel, Brilinta/Ticagrelor or Effient/Prasugrel) prescribed (includes medically managed patients)? - Yes 3. High Intensity Statin (Lipitor 40-51m or Crestor 20-433m prescribed? - No - has been managed on vytorin. Will consider switching to high dose statin. Discuss at follow up appt. 4. For EF <40%, was ACEI/ARB prescribed? - Not Applicable (EF >/= 4044%5. For EF <40%, Aldosterone Antagonist (Spironolactone or Eplerenone) prescribed? - Not Applicable (EF >/= 4062%6. Cardiac Rehab Phase II ordered (Included Medically managed Patients)? - Yes   _____________   Discharge Vitals Blood pressure 113/77, pulse 61, temperature 98.3 F (36.8 C), temperature source Skin, resp. rate 14, height 5' 11" (1.803 m), weight 117 kg, SpO2 100 %.  Filed Weights   09/20/19 0549  Weight: 117 kg    Last Labs & Radiologic Studies    CBC No results for input(s):  WBC, NEUTROABS, HGB, HCT, MCV, PLT in the last 72 hours. Basic Metabolic Panel No results for input(s): NA, K, CL, CO2, GLUCOSE, BUN, CREATININE, CALCIUM, MG, PHOS in the last 72 hours. Liver Function Tests No results for input(s): AST, ALT, ALKPHOS, BILITOT, PROT, ALBUMIN in the last 72 hours. No results for input(s): LIPASE, AMYLASE in the last 72 hours. High Sensitivity Troponin:   No results for input(s): TROPONINIHS in the last 720 hours.  BNP Invalid input(s): POCBNP D-Dimer No results for input(s): DDIMER in the last 72 hours. Hemoglobin A1C No results for input(s): HGBA1C in the last 72 hours. Fasting Lipid Panel No results for input(s): CHOL, HDL, LDLCALC, TRIG, CHOLHDL, LDLDIRECT in the last 72 hours. Thyroid Function Tests No results for input(s): TSH, T4TOTAL, T3FREE, THYROIDAB in the last 72 hours.  Invalid input(s): FREET3 _____________  CARDIAC CATHETERIZATION  Result Date: 09/20/2019  Prox RCA lesion is 40%  stenosed.  RPDA lesion is 30% stenosed.  Ramus lesion is 80% stenosed.  Mid Cx to Dist Cx lesion is 30% stenosed with 30% stenosed side Cruz in 2nd Mrg.  Mid LAD-1 lesion is 100% stenosed.  Mid LAD-2 lesion is 100% stenosed.  A drug-eluting stent was successfully placed using a STENT RESOLUTE ONYX [id number].  Post intervention, there is a 0% residual stenosis.  The left ventricular systolic function is normal.  LV end diastolic pressure is normal.  The left ventricular ejection fraction is 55-65% by visual estimate.  There is no mitral valve regurgitation.  1. Patent stents proximal and mid RCA with mild to moderate restenosis which does not appear to be flow limiting. 2. Chronic occlusion of the mid LAD. The mid and distal LAD fills from right to left collaterals. 3. Mild non-obstructive disease in the Circumflex artery 4. Severe stenosis in the moderate caliber intermediate Cruz. 5. Preserved LV systolic function 6. Successful PTCA/DES x 1 intermediate  Cruz Recommendations: Continue ASA and Plavix for at least 6 months. If he has continued dyspnea, could consider CTO PCI of the LAD. If this is the case, he would need to have his cath films reviewed by the CTO team. Same day PCI discharge.   MYOVIEW  Result Date: 08/27/2019  Nuclear stress EF: 56%.  There was no ST segment deviation noted during stress.  Defect 1: There is a medium defect of severe severity present in the mid anteroseptal, apical septal, apical inferior and apex location.  Findings consistent with prior myocardial infarction in the distal LAD territory. However, there is hypokinesis of the inferior wall, which is inconsistent with the perfusion defect.  This is a low risk study.  The left ventricular ejection fraction is normal (55-65%).     Disposition   Pt is being discharged home today in good condition.  Follow-up Plans & Appointments    Follow-up Information    Tommie Raymond, NP Follow up on 10/05/2019.   Specialty: Cardiology Why: at 10am for your follow up appt.  Contact information: [place] [address] [phone]          Discharge Instructions    Amb Referral to Cardiac Rehabilitation   Complete by: As directed    Diagnosis: Coronary Stents   After initial evaluation and assessments completed: Virtual Based Care may be provided alone or in conjunction with Phase 2 Cardiac Rehab based on patient barriers.: Yes       Discharge Medications   Allergies as of 09/20/2019      Reactions   Januvia [sitagliptin] Nausea Only, Other (See Comments)   Back Pain      Medication List    STOP taking these medications   famotidine 20 MG tablet Commonly known as: Pepcid   ibuprofen 200 MG tablet Commonly known as: ADVIL     TAKE these medications   aspirin EC 81 MG tablet Take 81 mg by mouth at bedtime.   blood glucose meter kit and supplies Dispense based on patient and insurance preference. Check blood  sugar once daily   carvedilol 6.25 MG tablet Commonly known as: COREG Take 1 tablet (6.25 mg total) by mouth 2 (two) times daily with a meal.   cetirizine 10 MG tablet Commonly known as: ZYRTEC Take 10 mg by mouth daily.   clopidogrel 75 MG tablet Commonly known as: PLAVIX Take 1 tablet (75 mg total) by mouth daily with breakfast. Start taking on: September 21, 2019  ezetimibe-simvastatin 10-10 MG tablet Commonly known as: VYTORIN Take 1 tablet by mouth daily.   Farxiga 10 MG Tabs tablet Generic drug: dapagliflozin propanediol Take 10 mg by mouth daily.   glipiZIDE 10 MG 24 hr tablet Commonly known as: GLUCOTROL XL Take 1 tablet (10 mg total) by mouth daily with breakfast. What changed: when to take this   linagliptin 5 MG Tabs tablet Commonly known as: Tradjenta Take 1 tablet (5 mg total) by mouth daily. What changed: when to take this   lisinopril 20 MG tablet Commonly known as: ZESTRIL Take 1 tablet (20 mg total) by mouth daily.   multivitamin tablet Take 1 tablet by mouth daily.   nitroGLYCERIN 0.4 MG SL tablet Commonly known as: NITROSTAT Place 1 tablet (0.4 mg total) under the tongue every 5 (five) minutes x 3 doses as needed for chest pain.   pantoprazole 40 MG tablet Commonly known as: PROTONIX Take 1 tablet (40 mg total) by mouth 2 (two) times daily before a meal. What changed: when to take this   pioglitazone-metformin 15-850 MG tablet Commonly known as: ACTOPLUS MET Take 1 tablet by mouth 2 (two) times daily with a meal.   Potassium 99 MG Tabs Take 99 mg by mouth at bedtime.       Allergies Allergies  Allergen Reactions  . Januvia [Sitagliptin] Nausea Only and Other (See Comments)    Back Pain    Outstanding Labs/Studies   N/a   Duration of Discharge Encounter   Greater than 30 minutes including physician time.  Signed, Reino Bellis, NP 09/20/2019, 2:48 PM

## 2019-09-20 NOTE — Discharge Instructions (Signed)
Radial Site Care  This sheet gives you information about how to care for yourself after your procedure. Your health care provider may also give you more specific instructions. If you have problems or questions, contact your health care provider. What can I expect after the procedure? After the procedure, it is common to have:  Bruising and tenderness at the catheter insertion area. Follow these instructions at home: Medicines  Take over-the-counter and prescription medicines only as told by your health care provider. Insertion site care  Follow instructions from your health care provider about how to take care of your insertion site. Make sure you: ? Wash your hands with soap and water before you change your bandage (dressing). If soap and water are not available, use hand sanitizer. ? Change your dressing as told by your health care provider. ? Leave stitches (sutures), skin glue, or adhesive strips in place. These skin closures may need to stay in place for 2 weeks or longer. If adhesive strip edges start to loosen and curl up, you may trim the loose edges. Do not remove adhesive strips completely unless your health care provider tells you to do that.  Check your insertion site every day for signs of infection. Check for: ? Redness, swelling, or pain. ? Fluid or blood. ? Pus or a bad smell. ? Warmth.  Do not take baths, swim, or use a hot tub until your health care provider approves.  You may shower 24-48 hours after the procedure, or as directed by your health care provider. ? Remove the dressing and gently wash the site with plain soap and water. ? Pat the area dry with a clean towel. ? Do not rub the site. That could cause bleeding.  Do not apply powder or lotion to the site. Activity   For 24 hours after the procedure, or as directed by your health care provider: ? Do not flex or bend the affected arm. ? Do not push or pull heavy objects with the affected arm. ? Do not  drive yourself home from the hospital or clinic. You may drive 24 hours after the procedure unless your health care provider tells you not to. ? Do not operate machinery or power tools.  Do not lift anything that is heavier than 10 lb (4.5 kg), or the limit that you are told, until your health care provider says that it is safe.  Ask your health care provider when it is okay to: ? Return to work or school. ? Resume usual physical activities or sports. ? Resume sexual activity. General instructions  If the catheter site starts to bleed, raise your arm and put firm pressure on the site. If the bleeding does not stop, get help right away. This is a medical emergency.  If you went home on the same day as your procedure, a responsible adult should be with you for the first 24 hours after you arrive home.  Keep all follow-up visits as told by your health care provider. This is important. Contact a health care provider if:  You have a fever.  You have redness, swelling, or yellow drainage around your insertion site. Get help right away if:  You have unusual pain at the radial site.  The catheter insertion area swells very fast.  The insertion area is bleeding, and the bleeding does not stop when you hold steady pressure on the area.  Your arm or hand becomes pale, cool, tingly, or numb. These symptoms may represent a serious problem   that is an emergency. Do not wait to see if the symptoms will go away. Get medical help right away. Call your local emergency services (911 in the [place] Do not drive yourself to the hospital. Summary  After the procedure, it is common to have bruising and tenderness at the site.  Follow instructions from your health care provider about how to take care of your radial site wound. Check the wound every day for signs of infection.  Do not lift anything that is heavier than 10 lb (4.5 kg), or the limit that you are told, until your health care provider says  that it is safe. This information is not intended to replace advice given to you by your health care provider. Make sure you discuss any questions you have with your health care provider. Document Released: 10/26/2010 Document Revised: 10/29/2017 Document Reviewed: 10/29/2017 Elsevier Patient Education  2020 [place].    Coronary Angiogram With Stent, Care After This sheet gives you information about how to care for yourself after your procedure. Your health care provider may also give you more specific instructions. If you have problems or questions, contact your health care provider. What can I expect after the procedure? After your procedure, it is common to have:  Bruising in the area where a small, thin tube (catheter) was inserted. This usually fades within 1-2 weeks.  Blood collecting in the tissue (hematoma) that may be painful to the touch. It should usually decrease in size and tenderness within 1-2 weeks. Follow these instructions at home: Insertion area care  Do not take baths, swim, or use a hot tub until your health care provider approves.  You may shower 24-48 hours after the procedure or as directed by your health care provider.  Follow instructions from your health care provider about how to take care of your incision. Make sure you: ? Wash your hands with soap and water before you change your bandage (dressing). If soap and water are not available, use hand sanitizer. ? Change your dressing as told by your health care provider. ? Leave stitches (sutures), skin glue, or adhesive strips in place. These skin closures may need to stay in place for 2 weeks or longer. If adhesive strip edges start to loosen and curl up, you may trim the loose edges. Do not remove adhesive strips completely unless your health care provider tells you to do that.  Remove the bandage (dressing) and gently wash the catheter insertion site with plain soap and water.  Pat the area dry with a  clean towel. Do not rub the area, because that may cause bleeding.  Do not apply powder or lotion to the incision area.  Check your incision area every day for signs of infection. Check for: ? More redness, swelling, or pain. ? More fluid or blood. ? Warmth. ? Pus or a bad smell. Activity  Do not drive for 24 hours if you were given a medicine to help you relax (sedative).  Do not lift anything that is heavier than 10 lb (4.5 kg) for 5 days after your procedure or as directed by your health care provider.  Ask your health care provider when it is okay for you: ? To return to work or school. ? To resume usual physical activities or sports. ? To resume sexual activity. Eating and drinking   Eat a heart-healthy diet. This should include plenty of fresh fruits and vegetables.  Avoid the following types of food: ? Food that is high  in salt. ? Canned or highly processed food. ? Food that is high in saturated fat or sugar. ? [employer].  Limit alcohol intake to no more than 1 drink a day for non-pregnant women and 2 drinks a day for men. One drink equals 12 oz of beer, 5 oz of wine, or 1 oz of hard liquor. Lifestyle   Do not use any products that contain nicotine or tobacco, such as cigarettes and e-cigarettes. If you need help quitting, ask your health care provider.  Take steps to manage and control your weight.  Get regular exercise.  Manage your blood pressure.  Manage other health problems, such as diabetes. General instructions  Take over-the-counter and prescription medicines only as told by your health care provider. Blood thinners may be prescribed after your procedure to improve blood flow through the stent.  If you need an MRI after your heart stent has been placed, be sure to tell the health care provider who orders the MRI that you have a heart stent.  Keep all follow-up visits as directed by your health care provider. This is important. Contact a health care  provider if:  You have a fever.  You have chills.  You have increased bleeding from the catheter insertion area. Hold pressure on the area. Get help right away if:  You develop chest pain or shortness of breath.  You feel faint or you pass out.  You have unusual pain at the catheter insertion area.  You have redness, warmth, or swelling at the catheter insertion area.  You have drainage (other than a small amount of blood on the dressing) from the catheter insertion area.  The catheter insertion area is bleeding, and the bleeding does not stop after 30 minutes of holding steady pressure on the area.  You develop bleeding from any other place, such as from your rectum. There may be bright red blood in your urine or stool, or it may appear as black, tarry stool. This information is not intended to replace advice given to you by your health care provider. Make sure you discuss any questions you have with your health care provider. Document Released: 04/12/2005 Document Revised: 09/05/2017 Document Reviewed: 06/20/2016 Elsevier Patient Education  2020 [place].   ?  ment Revised: 09/05/2017 Document Reviewed: 06/20/2016 Elsevier Patient Education  2020 [employer].

## 2019-09-20 NOTE — Progress Notes (Signed)
CARDIAC REHAB PHASE I   Stent education completed with pt. Pt educated on importance of ASA and Plavix. Stent card at bedside. Pt given heart healthy and diabetic diets. Reviewed restrictions, site care, and exercise guidelines. Will refer to CRP II [place]. Pt is interested in participating in Virtual Cardiac and Pulmonary Rehab. Pt advised that Virtual Cardiac and Pulmonary Rehab is provided at no cost to the patient.  Checklist:  1. Pt has smart device  ie smartphone and/or ipad for downloading an app  Yes 2. Reliable internet/wifi service    Yes 3. Understands how to use their smartphone and navigate within an app.  Yes  Pt verbalized understanding and is in agreement.  [id number] Rufina Falco, RN BSN 09/20/2019 10:46 AM

## 2019-09-20 NOTE — Interval H&P Note (Signed)
History and Physical Interval Note:  09/20/2019 7:24 AM  Daniel Cruz  has presented today for surgery, with the diagnosis of unstable angina/abnormal myview.  The various methods of treatment have been discussed with the patient and family. After consideration of risks, benefits and other options for treatment, the patient has consented to  Procedure(s): LEFT HEART CATH AND CORONARY ANGIOGRAPHY (N/A) as a surgical intervention.  The patient's history has been reviewed, patient examined, no change in status, stable for surgery.  I have reviewed the patient's chart and labs.  Questions were answered to the patient's satisfaction.    Cath Lab Visit (complete for each Cath Lab visit)  Clinical Evaluation Leading to the Procedure:   ACS: No.  Non-ACS:    Anginal Classification: CCS III  Anti-ischemic medical therapy: Maximal Therapy (2 or more classes of medications)  Non-Invasive Test Results: Low-risk stress test findings: cardiac mortality <1%/year  Prior CABG: No previous CABG         Lauree Chandler

## 2019-09-21 ENCOUNTER — Other Ambulatory Visit: Payer: Self-pay | Admitting: Internal Medicine

## 2019-09-23 MED ORDER — NITROGLYCERIN 0.4 MG SL SUBL: 0.4000 mg | SUBLINGUAL_TABLET | SUBLINGUAL | 3 refills | Status: DC | PRN

## 2019-09-24 ENCOUNTER — Other Ambulatory Visit: Payer: Self-pay

## 2019-09-24 ENCOUNTER — Other Ambulatory Visit: Payer: Self-pay | Admitting: Internal Medicine

## 2019-09-24 MED ORDER — NITROGLYCERIN 0.4 MG SL SUBL: 0.4000 mg | SUBLINGUAL_TABLET | SUBLINGUAL | 3 refills | Status: DC | PRN

## 2019-09-24 NOTE — Telephone Encounter (Signed)
Last OV 09/16/19 Next OV 01/14/20

## 2019-09-30 NOTE — Progress Notes (Signed)
Cardiology Office Note   Date:  10/05/2019   ID:  [name], DOB [birth year], MRN [id number]  PCP:  Colon Branch, MD  Cardiologist: Dr. Fransico Him, MD  Chief Complaint  Patient presents with  . Hospitalization Follow-up    History of Present Illness: Daniel Cruz is a 68 y.o. male who presents for hospital follow-up, seen for Dr. Radford Pax.  Mr. Peacock has a history of CAD with remote non-STEMI in 2001 with PCI of the mid and distal RCA with 50 to 60% proximal LAD and subsequent PCI of an 80% LAD and right PDA for 2010.  Other history includes hypertension, hyperlipidemia, DM2, GERD and OSA.  He was previously seen by Dr. Radford Pax in October 2020 and felt to be doing well from a cardiac perspective.  His BP meds were changed.  He then sent a MyChart message 08/2019 and noted shortness of breath and a "bubble "sensation in his chest with walking at which time she recommended a Myoview stress test.  Study showed prior scarring in the distal LAD, noted to be larger than prior studies with recommendations for follow-up in the office to discuss cardiac catheterization.  He presented 09/15/2019 with reports he had not felt well since his gallbladder surgery 1.5 years ago with continued digestive issues.  DOE had shortness of breath with walking worsened approximately 1 month prior to office visit with considerable pressure in his chest.  Patient reported he would belch for improvement.  Given his symptoms, and outpatient cardiac cath was recommended.  Patient underwent cath as above with patent stents in the p/mRCA with mild to moderate restenosis that was not flow-limiting, CTO of the mLAD with collaterals, along with new severe stenosis in the intermediate branch.  Successful PCI/DES x1 was placed.  Plan was for DAPT with ASA and Plavix for at least 6 months.  Noted if he continued to have dyspnea, could be considered for CTO PCI of the LAD.  Near discharge, statin therapy was  discussed with patient however he reported he had been on Vytorin for a long time.  He was advised considering a switch to high-dose statin.  LDL was noted to be stable at 47.  He is currently on combination pill of Actos/Metformin prescribed by his PCP however given his history of CAD, Jardiance was discussed as a possibility in the outpatient setting.  Today, he presents for post PCI follow-up and seems to be doing well from a CV perspective.  Has been having what he calls a "head cold" for which he has been taking Mucinex with complete relief.  Has plans to follow-up with PCP.  Does state he will have some chest tightness with his URI symptoms however this is relieved with the Mucinex.  He has been exercising without angina and tolerating well.  Cardiac rehabilitation was advised during hospitalization however patient deferred as he is participated in cardiac rehab on 2 other occasions.  He states that they "slow him down".  Cath site is stable without bleeding issues.  As above, does have CTO of his LAD therefore we did discuss monitoring his symptoms closely, if no improvement, could consider CTO PCI in the future.  Otherwise, denies shortness of breath, palpitations, dizziness, nausea, LE swelling, orthopnea, presyncopal or syncopal episodes.  Has been compliant with Plavix and aspirin therapies with no issues.  BP is stable today at 108/70 with a heart rate of 65 bpm.  Overall looks reassuring.  Past Medical History:  Diagnosis Date  . Anemia, iron deficiency    hx  . CAD (coronary artery disease)     NSTEMI with PCI of the mid and distal RCA with 50-60% LAD 09/2000, s/p cath 01/2009 with patent RCA stent and 90% LAD s/p PCI of LAD and right PDA  . Cholelithiasis   . DIABETES MELLITUS, TYPE II 02/26/2007  . Fatty liver 02/2009   per u/s  . GERD (gastroesophageal reflux disease)   . HTN (hypertension)   . Myocardial infarction ([place])    2001  . OSA (obstructive sleep apnea)    can't tol. a CPAP    . Plantar fasciitis of left foot   . PVC (premature ventricular contraction) 06/06/2017  . Sleep apnea   . UTI (lower urinary tract infection)    in the 80s and 11/09    Past Surgical History:  Procedure Laterality Date  . CARDIAC CATHETERIZATION  10/2008   stents  . CHOLECYSTECTOMY N/A 07/16/2016   Procedure: LAPAROSCOPIC CHOLECYSTECTOMY WITH INTRAOPERATIVE CHOLANGIOGRAM;  Surgeon: [name], MD;  Location: [facility];  Service: General;  Laterality: N/A;  . CORONARY STENT INTERVENTION N/A 09/20/2019   Procedure: CORONARY STENT INTERVENTION;  Surgeon: Burnell Blanks, MD;  Location: [facility];  Service: Cardiovascular;  Laterality: N/A;  . LEFT HEART CATH AND CORONARY ANGIOGRAPHY N/A 09/20/2019   Procedure: LEFT HEART CATH AND CORONARY ANGIOGRAPHY;  Surgeon: Burnell Blanks, MD;  Location: [facility];  Service: Cardiovascular;  Laterality: N/A;  . SEPTOPLASTY     w/ bilateral inferior turbinate reductions  . TONSILLECTOMY       Current Outpatient Medications  Medication Sig Dispense Refill  . aspirin EC 81 MG tablet Take 81 mg by mouth at bedtime.     . blood glucose meter kit and supplies Dispense based on patient and insurance preference. Check blood sugar once daily 1 each 0  . carvedilol (COREG) 6.25 MG tablet Take 1 tablet (6.25 mg total) by mouth 2 (two) times daily with a meal. 180 tablet 3  . cetirizine (ZYRTEC) 10 MG tablet Take 10 mg by mouth daily.    . clopidogrel (PLAVIX) 75 MG tablet Take 1 tablet (75 mg total) by mouth daily with breakfast. 90 tablet 1  . dapagliflozin propanediol (FARXIGA) 10 MG TABS tablet Take 10 mg by mouth daily. 90 tablet 1  . ezetimibe-simvastatin (VYTORIN) 10-10 MG tablet TAKE 1 TABLET DAILY 90 tablet 1  . glipiZIDE (GLUCOTROL XL) 10 MG 24 hr tablet Take 1 tablet (10 mg total) by mouth daily with breakfast. 90 tablet 1  . guaiFENesin (MUCINEX) 600 MG 12 hr tablet Take by mouth 2 (two) times daily.    Marland Kitchen  lisinopril (ZESTRIL) 20 MG tablet Take 1 tablet (20 mg total) by mouth daily. 90 tablet 3  . Multiple Vitamin (MULTIVITAMIN) tablet Take 1 tablet by mouth daily.      . nitroGLYCERIN (NITROSTAT) 0.4 MG SL tablet Place 1 tablet (0.4 mg total) under the tongue every 5 (five) minutes x 3 doses as needed for chest pain. 25 tablet 3  . ONETOUCH ULTRA test strip CHECK BLOOD SUGAR 1 DAILY 100 strip 1  . pantoprazole (PROTONIX) 40 MG tablet Take 1 tablet (40 mg total) by mouth 2 (two) times daily before a meal. 180 tablet 3  . pioglitazone-metformin (ACTOPLUS MET) 15-850 MG tablet Take 1 tablet by mouth 2 (two) times daily with a meal. 180 tablet 1  . Potassium 99 MG TABS Take 99 mg  by mouth at bedtime.    . TRADJENTA 5 MG TABS tablet TAKE 1 TABLET DAILY 90 tablet 1   No current facility-administered medications for this visit.    Allergies:   Januvia [sitagliptin]    Social History:  The patient  reports that he quit smoking about 19 years ago. He has a 24.00 pack-year smoking history. He has never used smokeless tobacco. He reports current alcohol use. He reports that he does not use drugs.   Family History:  The patient's family history includes COPD in his mother; Coronary artery disease in his father; Emphysema in his mother; Stroke in his father.    ROS:  Please see the history of present illness. Otherwise, review of systems are positive for none.   All other systems are reviewed and negative.    PHYSICAL EXAM: VS:  BP 108/70   Pulse 65   Ht '5\' 11"'$  (1.803 m)   Wt 255 lb (115.7 kg)   SpO2 99%   BMI 35.57 kg/m  , BMI Body mass index is 35.57 kg/m.   General: Well developed, well nourished, NAD Neck: Negative for carotid bruits. No JVD Lungs:Clear to ausculation bilaterally. No wheezes, rales, or rhonchi. Breathing is unlabored. Cardiovascular: RRR with S1 S2. No murmurs, rubs, gallops, or LV heave appreciated. Extremities: No edema.  PT pulses 2+ bilaterally Neuro: Alert and  oriented. No focal deficits. No facial asymmetry. MAE spontaneously. Psych: Responds to questions appropriately with normal affect.    EKG:  EKG is not ordered today.   Recent Labs: 09/15/2019: ALT 18; BUN 21; Creatinine, Ser 1.10; Hemoglobin 12.7; Platelets 215; Potassium 4.8; Sodium 137    Lipid Panel    Component Value Date/Time   CHOL 110 06/15/2019 0907   TRIG 106.0 06/15/2019 0907   TRIG 106 09/15/2006 0947   HDL 41.90 06/15/2019 0907   CHOLHDL 3 06/15/2019 0907   VLDL 21.2 06/15/2019 0907   LDLCALC 47 06/15/2019 0907      Wt Readings from Last 3 Encounters:  10/05/19 255 lb (115.7 kg)  09/20/19 258 lb (117 kg)  09/16/19 261 lb 4 oz (118.5 kg)      Other studies Reviewed: Additional studies/ records that were reviewed today include:  Cardiac Catheterization 09/20/2019:   Prox RCA lesion is 40% stenosed.  RPDA lesion is 30% stenosed.  Ramus lesion is 80% stenosed.  Mid Cx to Dist Cx lesion is 30% stenosed with 30% stenosed side branch in 2nd Mrg.  Mid LAD-1 lesion is 100% stenosed.  Mid LAD-2 lesion is 100% stenosed.  A drug-eluting stent was successfully placed using a STENT RESOLUTE ONYX [id number].  Post intervention, there is a 0% residual stenosis.  The left ventricular systolic function is normal.  LV end diastolic pressure is normal.  The left ventricular ejection fraction is 55-65% by visual estimate.  There is no mitral valve regurgitation.  1. Patent stents proximal and mid RCA with mild to moderate restenosis which does not appear to be flow limiting.  2. Chronic occlusion of the mid LAD. The mid and distal LAD fills from right to left collaterals.  3. Mild non-obstructive disease in the Circumflex artery 4. Severe stenosis in the moderate caliber intermediate branch.  5. Preserved LV systolic function 6. Successful PTCA/DES x 1 intermediate branch  Recommendations: Continue ASA and Plavix for at least 6 months. If he has continued  dyspnea, could consider CTO PCI of the LAD. If this is the case, he would need to have his cath  films reviewed by the CTO team. Same day PCI discharge.   Diagnostic Dominance: Right  Intervention     ASSESSMENT AND PLAN:  1.  CAD s/p PCI: -Cardiac cath as above with patent stents in the p/mRCA with mild to moderate restenosis that was not flow-limiting, CTO of the mLAD with collaterals, along with new severe stenosis in the intermediate branch.  Successful PCI/DES x1 was placed.   -Plan was for DAPT with ASA and Plavix for at least 6 months.  Noted if he continued to have dyspnea, could be considered for CTO PCI of the LAD. -Cath site unremarkable -Exercising at home including walking without anginal symptoms  -Having issues with URI for which he had several episodes of "chest tightness" which has been relieved with Mucinex administration>> no recurrence in approximately 5 to 7 days.  Plan is to follow-up with PCP.  -Monitor symptoms closely with close follow-up as above  2.  Hypertension: -Stable, 108/70 -Continue current regimen  3.  HLD: -Noted to be on Vytorin with discussion of transition to high intensity statin given CAD -LDL stable at 47  4.  DM2: -Hemoglobin A1c stable -Noted to be on Actos/Metformin combination with discussion in hospital of transition to Jardiance given significant history of CAD -Plan was to discuss with PCP at next follow-up  5.  History of tobacco use: -Continue cessation   Current medicines are reviewed at length with the patient today.  The patient does not have concerns regarding medicines.  The following changes have been made:  no change  Labs/ tests ordered today include: None  No orders of the defined types were placed in this encounter.    Disposition:   FU with Truitt Merle in 1 month  Signed, Kathyrn Drown, NP  10/05/2019 10:37 AM    [address], [place], [place]  [address] Phone:  [phone]; Fax: [phone]

## 2019-10-01 ENCOUNTER — Other Ambulatory Visit: Payer: Self-pay | Admitting: Internal Medicine

## 2019-10-05 ENCOUNTER — Encounter: Payer: Self-pay | Admitting: Cardiology

## 2019-10-05 ENCOUNTER — Other Ambulatory Visit: Payer: Self-pay

## 2019-10-05 ENCOUNTER — Ambulatory Visit (INDEPENDENT_AMBULATORY_CARE_PROVIDER_SITE_OTHER): Payer: Medicare Other | Admitting: Cardiology

## 2019-10-05 VITALS — BP 108/70 | HR 65 | Ht 71.0 in | Wt 255.0 lb

## 2019-10-05 DIAGNOSIS — I10 Essential (primary) hypertension: Secondary | ICD-10-CM

## 2019-10-05 DIAGNOSIS — I2510 Atherosclerotic heart disease of native coronary artery without angina pectoris: Secondary | ICD-10-CM

## 2019-10-05 DIAGNOSIS — Z955 Presence of coronary angioplasty implant and graft: Secondary | ICD-10-CM

## 2019-10-05 NOTE — Patient Instructions (Signed)
Medication Instructions:   Your physician recommends that you continue on your current medications as directed. Please refer to the Current Medication list given to you today.  *If you need a refill on your cardiac medications before your next appointment, please call your pharmacy*  Lab Work:  None ordered today  If you have labs (blood work) drawn today and your tests are completely normal, you will receive your results only by: Marland Kitchen MyChart Message (if you have MyChart) OR . A paper copy in the mail If you have any lab test that is abnormal or we need to change your treatment, we will call you to review the results.  Testing/Procedures:  None ordered today  Follow-Up: At [facility], you and your health needs are our priority.  As part of our continuing mission to provide you with exceptional heart care, we have created designated Provider Care Teams.  These Care Teams include your primary Cardiologist (physician) and Advanced Practice Providers (APPs -  Physician Assistants and Nurse Practitioners) who all work together to provide you with the care you need, when you need it.  Your next appointment:    Keep your follow up 10/19/19 at 2:00PM with Truitt Merle, NP

## 2019-10-15 ENCOUNTER — Encounter: Payer: Self-pay | Admitting: Internal Medicine

## 2019-10-15 MED ORDER — FARXIGA 10 MG PO TABS: 10.0000 mg | ORAL_TABLET | Freq: Every day | ORAL | 1 refills | Status: DC

## 2019-10-15 NOTE — Progress Notes (Signed)
CARDIOLOGY OFFICE NOTE  Date:  10/19/2019    Daniel Cruz Date of Birth: 09/11/1951 Medical Record #[id number]  PCP:  Daniel Branch, MD  Cardiologist:  Daniel Cruz  Chief Complaint  Patient presents with  . Follow-up    History of Present Illness: Daniel Cruz is a 69 y.o. male who presents today for a follow up visit. Seen for Dr. Radford Cruz.  [name] has a history of CAD with remote non-STEMI in 2001 with PCI of the mid and distal RCA with 50 to 60% proximal LAD and subsequent PCI of an 80% LAD and right PDA for 2010.  Other history includes hypertension, hyperlipidemia, DM2, GERD and OSA.  Last seen by Dr. Radford Cruz in October 2020 - BP meds were changed.  He then sent a MyChart message 08/2019 and noted shortness of breath and a "bubble "sensation in his chest with walking and she recommended a Myoview stress test.  Study showed prior scarring in the distal LAD, noted to be larger than prior studies with recommendations for follow-up in the office to discuss cardiac catheterization.  I then saw him in early December - given symptoms and findings on Myoview - cardiac cath was recommended. This showed patent stents in the p/mRCA with mild to moderate restenosis that was not flow-limiting, CTO of the mLAD with collaterals, along with new severe stenosis in the intermediate Cruz.  Successful PCI/DES x1 was placed.  Plan was for DAPT with ASA and Plavix for at least 6 months.  Noted if he continued to have dyspnea, could be considered for CTO PCI of the LAD. Discussed about higher intensity statin and having Jardiance added as outpatient.   He saw Daniel Drown, NP back for his post cath visit - had a "head cold". Using Mucinex. Otherwise, felt to be doing ok.   The patient does not have symptoms concerning for COVID-19 infection (fever, chills, cough, or new shortness of breath).   Comes in today. Here alone. He notes he is feeling better. He took the Mucinex - his head  cold is improved. Will have some shortness of breath "out of the blue" - nothing like what he has had in the past. He notes this is "100% better". No chest pain. He has previously has had some chest tightness - does not have any of this as well. He likes walking up stairs. He knows he needs to work on his weight. He would like a COVID vaccine - he is in [employer]. He remains on DAPT.     Past Medical History:  Diagnosis Date  . Anemia, iron deficiency    hx  . CAD (coronary artery disease)     NSTEMI with PCI of the mid and distal RCA with 50-60% LAD 09/2000, s/p cath 01/2009 with patent RCA stent and 90% LAD s/p PCI of LAD and right PDA  . Cholelithiasis   . DIABETES MELLITUS, TYPE II 02/26/2007  . Fatty liver 02/2009   per u/s  . GERD (gastroesophageal reflux disease)   . HTN (hypertension)   . Myocardial infarction ([place])    2001  . OSA (obstructive sleep apnea)    can't tol. a CPAP  . Plantar fasciitis of left foot   . PVC (premature ventricular contraction) 06/06/2017  . Sleep apnea   . UTI (lower urinary tract infection)    in the 80s and 11/09    Past Surgical History:  Procedure Laterality Date  . CARDIAC CATHETERIZATION  10/2008  stents  . CHOLECYSTECTOMY N/A 07/16/2016   Procedure: LAPAROSCOPIC CHOLECYSTECTOMY WITH INTRAOPERATIVE CHOLANGIOGRAM;  Surgeon: [name], MD;  Location: [place];  Service: General;  Laterality: N/A;  . CORONARY STENT INTERVENTION N/A 09/20/2019   Procedure: CORONARY STENT INTERVENTION;  Surgeon: Burnell Blanks, MD;  Location: [place] [facility];  Service: Cardiovascular;  Laterality: N/A;  . LEFT HEART CATH AND CORONARY ANGIOGRAPHY N/A 09/20/2019   Procedure: LEFT HEART CATH AND CORONARY ANGIOGRAPHY;  Surgeon: Burnell Blanks, MD;  Location: [place] [facility];  Service: Cardiovascular;  Laterality: N/A;  . SEPTOPLASTY     w/ bilateral inferior turbinate reductions  . TONSILLECTOMY       Medications: Current Meds   Medication Sig  . aspirin EC 81 MG tablet Take 81 mg by mouth at bedtime.   . blood glucose meter kit and supplies Dispense based on patient and insurance preference. Check blood sugar once daily  . carvedilol (COREG) 6.25 MG tablet Take 1 tablet (6.25 mg total) by mouth 2 (two) times daily with a meal.  . cetirizine (ZYRTEC) 10 MG tablet Take 10 mg by mouth daily.  . clopidogrel (PLAVIX) 75 MG tablet Take 1 tablet (75 mg total) by mouth daily with breakfast.  . dapagliflozin propanediol (FARXIGA) 10 MG TABS tablet Take 10 mg by mouth daily.  Marland Kitchen ezetimibe-simvastatin (VYTORIN) 10-10 MG tablet TAKE 1 TABLET DAILY  . glipiZIDE (GLUCOTROL XL) 10 MG 24 hr tablet Take 1 tablet (10 mg total) by mouth daily with breakfast.  . guaiFENesin (MUCINEX) 600 MG 12 hr tablet Take by mouth 2 (two) times daily.  Marland Kitchen lisinopril (ZESTRIL) 20 MG tablet Take 1 tablet (20 mg total) by mouth daily.  . Multiple Vitamin (MULTIVITAMIN) tablet Take 1 tablet by mouth daily.    . nitroGLYCERIN (NITROSTAT) 0.4 MG SL tablet Place 1 tablet (0.4 mg total) under the tongue every 5 (five) minutes x 3 doses as needed for chest pain.  Glory Rosebush ULTRA test strip CHECK BLOOD SUGAR 1 DAILY  . pantoprazole (PROTONIX) 40 MG tablet Take 1 tablet (40 mg total) by mouth 2 (two) times daily before a meal.  . pioglitazone-metformin (ACTOPLUS MET) 15-850 MG tablet Take 1 tablet by mouth 2 (two) times daily with a meal.  . Potassium 99 MG TABS Take 99 mg by mouth at bedtime.  . TRADJENTA 5 MG TABS tablet TAKE 1 TABLET DAILY     Allergies: Allergies  Allergen Reactions  . Januvia [Sitagliptin] Nausea Only and Other (See Comments)    Back Pain    Social History: The patient  reports that he quit smoking about 19 years ago. He has a 24.00 pack-year smoking history. He has never used smokeless tobacco. He reports current alcohol use. He reports that he does not use drugs.   Family History: The patient's family history includes COPD in  his mother; Coronary artery disease in his father; Emphysema in his mother; Stroke in his father.   Review of Systems: Please see the history of present illness.   All other systems are reviewed and negative.   Physical Exam: VS:  BP 122/70   Pulse 64   Ht _0  (1.803 m)   Wt 266 lb (120.7 kg)   SpO2 95%   BMI 37.10 kg/m  .  BMI Body mass index is 37.1 kg/m.  Wt Readings from Last 3 Encounters:  10/19/19 266 lb (120.7 kg)  10/05/19 255 lb (115.7 kg)  09/20/19 258 lb (117 kg)  General: Pleasant. Well developed, well nourished and in no acute distress. His up 6 pounds since my last visit.   HEENT: Normal.  Neck: Supple, no JVD, carotid bruits, or masses noted.  Cardiac: Regular rate and rhythm. No murmurs, rubs, or gallops. No edema.  Respiratory:  Lungs are clear to auscultation bilaterally with normal work of breathing.  GI: Soft and nontender.  MS: No deformity or atrophy. Gait and ROM intact.  Skin: Warm and dry. Color is normal.  Neuro:  Strength and sensation are intact and no gross focal deficits noted.  Psych: Alert, appropriate and with normal affect.   LABORATORY DATA:  EKG:  EKG is not ordered today.   Lab Results  Component Value Date   WBC 6.2 09/15/2019   HGB 12.7 (L) 09/15/2019   HCT 37.5 09/15/2019   PLT 215 09/15/2019   GLUCOSE 109 (H) 09/15/2019   CHOL 110 06/15/2019   TRIG 106.0 06/15/2019   HDL 41.90 06/15/2019   LDLCALC 47 06/15/2019   ALT 18 09/15/2019   AST 27 09/15/2019   NA 137 09/15/2019   K 4.8 09/15/2019   CL 105 09/15/2019   CREATININE 1.10 09/15/2019   BUN 21 09/15/2019   CO2 25 09/15/2019   TSH 1.340 06/06/2017   PSA 1.40 06/15/2019   INR 1.05 07/14/2013   HGBA1C 6.9 (H) 06/15/2019   MICROALBUR 0.5 10/20/2009     BNP (last 3 results) No results for input(s): BNP in the last 8760 hours.  ProBNP (last 3 results) No results for input(s): PROBNP in the last 8760 hours.   Other Studies Reviewed Today:  Cardiac  Catheterization12/14/2020:   Prox RCA lesion is 40% stenosed.  RPDA lesion is 30% stenosed.  Ramus lesion is 80% stenosed.  Mid Cx to Dist Cx lesion is 30% stenosed with 30% stenosed side Cruz in 2nd Mrg.  Mid LAD-1 lesion is 100% stenosed.  Mid LAD-2 lesion is 100% stenosed.  A drug-eluting stent was successfully placed using a STENT RESOLUTE ONYX [id number].  Post intervention, there is a 0% residual stenosis.  The left ventricular systolic function is normal.  LV end diastolic pressure is normal.  The left ventricular ejection fraction is 55-65% by visual estimate.  There is no mitral valve regurgitation.  1. Patent stents proximal and mid RCA with mild to moderate restenosis which does not appear to be flow limiting.  2. Chronic occlusion of the mid LAD. The mid and distal LAD fills from right to left collaterals.  3. Mild non-obstructive disease in the Circumflex artery 4. Severe stenosis in the moderate caliber intermediate Cruz.  5. Preserved LV systolic function 6. Successful PTCA/DES x 1 intermediate Cruz  Recommendations: Continue ASA and Plavix for at least 6 months. If he has continued dyspnea, could consider CTO PCI of the LAD. If this is the case, he would need to have his cath films reviewed by the CTO team. Same day PCI discharge.   Diagnostic Dominance: Right  Intervention     ASSESSMENT AND PLAN:  1.  CAD s/p PCI: -Cardiac cath as above with patent stents in the p/mRCA with mild to moderate restenosis that was not flow-limiting, CTO of the mLAD with collaterals, along with new severe stenosis in the intermediate Cruz.  Successful PCI/DES x1 was placed.   -Plan was for DAPT with ASA and Plavix for at least 6 months.  Noted if he continued to have dyspnea, could be considered for CTO PCI of the LAD.  He continues to  do very well. His breathing is 100% better. He has no chest tightness. I have left him on his current regimen. I explained  that we would push medical therapy - and if then fails would consider CTO - but this is not needed at this time.   2.  HTN - BP is fine. No changes made today.   3. HLD - on statin - lab by PCP. LDL is at goal.   4. DM - per PCP  5. Prior tobacco use  6. COVID-19 Education: The signs and symptoms of COVID-19 were discussed with the patient and how to seek care for testing (follow up with PCP or arrange E-visit).  The importance of social distancing, staying at home, hand hygiene and wearing a mask when out in public were discussed today.   He is interested in the vaccine - he will My Chart [place] his form and I will fill out for him.   Current medicines are reviewed with the patient today.  The patient does not have concerns regarding medicines other than what has been noted above.  The following changes have been made:  See above.  Labs/ tests ordered today include:   No orders of the defined types were placed in this encounter.    Disposition:   FU with me in 4 months.   Patient is agreeable to this plan and will call if any problems develop in the interim.   SignedTruitt Merle, NP  10/19/2019 2:47 PM  [facility] [address] [place], [place]  [address] Phone: [phone] Fax: [phone]

## 2019-10-19 ENCOUNTER — Ambulatory Visit: Payer: Medicare HMO | Admitting: Nurse Practitioner

## 2019-10-19 ENCOUNTER — Encounter: Payer: Self-pay | Admitting: Nurse Practitioner

## 2019-10-19 ENCOUNTER — Telehealth: Payer: Self-pay | Admitting: *Deleted

## 2019-10-19 ENCOUNTER — Other Ambulatory Visit: Payer: Self-pay

## 2019-10-19 VITALS — BP 122/70 | HR 64 | Ht 71.0 in | Wt 266.0 lb

## 2019-10-19 DIAGNOSIS — I2510 Atherosclerotic heart disease of native coronary artery without angina pectoris: Secondary | ICD-10-CM

## 2019-10-19 DIAGNOSIS — I10 Essential (primary) hypertension: Secondary | ICD-10-CM

## 2019-10-19 DIAGNOSIS — E7800 Pure hypercholesterolemia, unspecified: Secondary | ICD-10-CM

## 2019-10-19 DIAGNOSIS — R0609 Other forms of dyspnea: Secondary | ICD-10-CM

## 2019-10-19 DIAGNOSIS — Z7189 Other specified counseling: Secondary | ICD-10-CM | POA: Diagnosis not present

## 2019-10-19 DIAGNOSIS — R0600 Dyspnea, unspecified: Secondary | ICD-10-CM | POA: Diagnosis not present

## 2019-10-19 DIAGNOSIS — I259 Chronic ischemic heart disease, unspecified: Secondary | ICD-10-CM | POA: Diagnosis not present

## 2019-10-19 DIAGNOSIS — Z955 Presence of coronary angioplasty implant and graft: Secondary | ICD-10-CM

## 2019-10-19 NOTE — Telephone Encounter (Signed)
Left pt's note downstairs to get COVID test with [facility] due to pt being on blood thinner.

## 2019-10-19 NOTE — Patient Instructions (Addendum)
After Visit Summary:  We will be checking the following labs today - NONE   Medication Instructions:    Continue with your current medicines.    If you need a refill on your cardiac medications before your next appointment, please call your pharmacy.     Testing/Procedures To Be Arranged:  N/A  Follow-Up:   See me in 4 months.     At [facility], you and your health needs are our priority.  As part of our continuing mission to provide you with exceptional heart care, we have created designated Provider Care Teams.  These Care Teams include your primary Cardiologist (physician) and Advanced Practice Providers (APPs -  Physician Assistants and Nurse Practitioners) who all work together to provide you with the care you need, when you need it.  Special Instructions:  . Stay safe, stay home, wash your hands for at least 20 seconds and wear a mask when out in public.  . It was good to talk with you today.  . I am happy to fill out your form for your vaccine.    Call the [place] office at [phone] if you have any questions, problems or concerns.

## 2019-10-24 ENCOUNTER — Encounter: Payer: Self-pay | Admitting: Internal Medicine

## 2019-10-25 MED ORDER — PIOGLITAZONE HCL-METFORMIN HCL 15-850 MG PO TABS: 1.0000 | ORAL_TABLET | Freq: Two times a day (BID) | ORAL | 1 refills | Status: DC

## 2019-11-01 ENCOUNTER — Encounter: Payer: Self-pay | Admitting: Internal Medicine

## 2019-11-05 ENCOUNTER — Ambulatory Visit: Payer: Medicare HMO

## 2019-11-06 DIAGNOSIS — Z7982 Long term (current) use of aspirin: Secondary | ICD-10-CM | POA: Diagnosis not present

## 2019-11-06 DIAGNOSIS — Z7902 Long term (current) use of antithrombotics/antiplatelets: Secondary | ICD-10-CM | POA: Diagnosis not present

## 2019-11-06 DIAGNOSIS — E1159 Type 2 diabetes mellitus with other circulatory complications: Secondary | ICD-10-CM | POA: Diagnosis not present

## 2019-11-06 DIAGNOSIS — J309 Allergic rhinitis, unspecified: Secondary | ICD-10-CM | POA: Diagnosis not present

## 2019-11-06 DIAGNOSIS — E785 Hyperlipidemia, unspecified: Secondary | ICD-10-CM | POA: Diagnosis not present

## 2019-11-06 DIAGNOSIS — I10 Essential (primary) hypertension: Secondary | ICD-10-CM | POA: Diagnosis not present

## 2019-11-06 DIAGNOSIS — I2510 Atherosclerotic heart disease of native coronary artery without angina pectoris: Secondary | ICD-10-CM | POA: Diagnosis not present

## 2019-11-06 DIAGNOSIS — I252 Old myocardial infarction: Secondary | ICD-10-CM | POA: Diagnosis not present

## 2019-11-06 DIAGNOSIS — K219 Gastro-esophageal reflux disease without esophagitis: Secondary | ICD-10-CM | POA: Diagnosis not present

## 2019-11-12 ENCOUNTER — Ambulatory Visit: Payer: Medicare HMO | Attending: Internal Medicine

## 2019-11-12 DIAGNOSIS — Z23 Encounter for immunization: Secondary | ICD-10-CM | POA: Insufficient documentation

## 2019-11-12 NOTE — Progress Notes (Signed)
   Covid-19 Vaccination Clinic  Name:  Daniel Cruz    MRN: [id number] DOB: [birth year]  11/12/2019  Daniel Cruz was observed post Covid-19 immunization for 15 minutes without incidence. He was provided with Vaccine Information Sheet and instruction to access the V-Safe system.   Daniel Cruz was instructed to call 911 with any severe reactions post vaccine: Marland Kitchen Difficulty breathing  . Swelling of your face and throat  . A fast heartbeat  . A bad rash all over your body  . Dizziness and weakness    Immunizations Administered    Name Date Dose VIS Date Route   Pfizer COVID-19 Vaccine 11/12/2019 11:18 AM 0.3 mL 09/17/2019 Intramuscular   Manufacturer: [place]   Lot: [id number]   [place]: [id number]

## 2019-11-26 ENCOUNTER — Ambulatory Visit: Payer: Medicare HMO

## 2019-12-06 ENCOUNTER — Other Ambulatory Visit: Payer: Self-pay | Admitting: Internal Medicine

## 2019-12-07 ENCOUNTER — Ambulatory Visit: Payer: Medicare HMO | Attending: Internal Medicine

## 2019-12-07 DIAGNOSIS — Z23 Encounter for immunization: Secondary | ICD-10-CM | POA: Insufficient documentation

## 2019-12-07 NOTE — Progress Notes (Signed)
   Covid-19 Vaccination Clinic  Name:  Daniel Cruz    MRN: [id number] DOB: 1951/03/28  12/07/2019  Daniel Cruz was observed post Covid-19 immunization for 15 minutes without incident. He was provided with Vaccine Information Sheet and instruction to access the V-Safe system.   Daniel Cruz was instructed to call 911 with any severe reactions post vaccine: Marland Kitchen Difficulty breathing  . Swelling of face and throat  . A fast heartbeat  . A bad rash all over body  . Dizziness and weakness   Immunizations Administered    Name Date Dose VIS Date Route   Pfizer COVID-19 Vaccine 12/07/2019 11:56 AM 0.3 mL 09/17/2019 Intramuscular   Manufacturer: [place]   Lot: [id number]   [place]: [id number]

## 2019-12-14 ENCOUNTER — Other Ambulatory Visit: Payer: Self-pay | Admitting: Internal Medicine

## 2020-01-13 ENCOUNTER — Other Ambulatory Visit: Payer: Self-pay

## 2020-01-14 ENCOUNTER — Ambulatory Visit (INDEPENDENT_AMBULATORY_CARE_PROVIDER_SITE_OTHER): Payer: Medicare HMO | Admitting: Internal Medicine

## 2020-01-14 ENCOUNTER — Other Ambulatory Visit: Payer: Self-pay

## 2020-01-14 ENCOUNTER — Encounter: Payer: Self-pay | Admitting: Internal Medicine

## 2020-01-14 VITALS — BP 121/63 | HR 58 | Temp 97.0°F | Resp 18 | Ht 71.0 in | Wt 262.5 lb

## 2020-01-14 DIAGNOSIS — L989 Disorder of the skin and subcutaneous tissue, unspecified: Secondary | ICD-10-CM | POA: Diagnosis not present

## 2020-01-14 DIAGNOSIS — I2510 Atherosclerotic heart disease of native coronary artery without angina pectoris: Secondary | ICD-10-CM | POA: Diagnosis not present

## 2020-01-14 DIAGNOSIS — E118 Type 2 diabetes mellitus with unspecified complications: Secondary | ICD-10-CM

## 2020-01-14 DIAGNOSIS — I10 Essential (primary) hypertension: Secondary | ICD-10-CM | POA: Diagnosis not present

## 2020-01-14 DIAGNOSIS — E7800 Pure hypercholesterolemia, unspecified: Secondary | ICD-10-CM

## 2020-01-14 LAB — BASIC METABOLIC PANEL
BUN: 16 mg/dL (ref 6–23)
CO2: 25 mEq/L (ref 19–32)
Calcium: 8.8 mg/dL (ref 8.4–10.5)
Chloride: 106 mEq/L (ref 96–112)
Creatinine, Ser: 0.91 mg/dL (ref 0.40–1.50)
GFR: 82.1 mL/min (ref >60.00)
Glucose, Bld: 79 mg/dL (ref 70–99)
Potassium: 4.7 mEq/L (ref 3.5–5.1)
Sodium: 139 mEq/L (ref 135–145)

## 2020-01-14 LAB — LIPID PANEL
Cholesterol: 104 mg/dL (ref 0–200)
HDL: 41.9 mg/dL (ref >39.00)
LDL Cholesterol: 44 mg/dL (ref 0–99)
NonHDL: 62.16
Total CHOL/HDL Ratio: 2
Triglycerides: 90 mg/dL (ref 0.0–149.0)
VLDL: 18 mg/dL (ref 0.0–40.0)

## 2020-01-14 LAB — HEMOGLOBIN A1C: Hgb A1c MFr Bld: 6.5 % (ref 4.6–6.5)

## 2020-01-14 NOTE — Patient Instructions (Signed)
Were checking your blood sugar test today, you may like to start checking your blood sugar from time to time  GO TO THE LAB : Get the blood work     [place], [place] back for a physical exam in 5 months

## 2020-01-14 NOTE — Progress Notes (Signed)
Subjective:    Patient ID: Daniel Cruz, male    DOB: [birth year], 69 y.o.   MRN: [id number]  DOS:  01/14/2020 Type of visit - description: Routine checkup Heart disease: Chart reviewed Had back pain, he thinks it is because to lack of exercise and stretching, he is back on a routine and he feels 100% better. DM: No recent ambulatory CBGs HTN: No recent ambulatory BPs Has a couple of skin lesions that worries him  BP Readings from Last 3 Encounters:  01/14/20 121/63  10/19/19 122/70  10/05/19 108/70    Review of Systems Denies chest pain, difficulty breathing. No DOE No nausea or vomiting No edema  Past Medical History:  Diagnosis Date  . Anemia, iron deficiency    hx  . CAD (coronary artery disease)     NSTEMI with PCI of the mid and distal RCA with 50-60% LAD 09/2000, s/p cath 01/2009 with patent RCA stent and 90% LAD s/p PCI of LAD and right PDA  . Cholelithiasis   . DIABETES MELLITUS, TYPE II 02/26/2007  . Fatty liver 02/2009   per u/s  . GERD (gastroesophageal reflux disease)   . HTN (hypertension)   . Myocardial infarction ([place])    2001  . OSA (obstructive sleep apnea)    can't tol. a CPAP  . Plantar fasciitis of left foot   . PVC (premature ventricular contraction) 06/06/2017  . Sleep apnea   . UTI (lower urinary tract infection)    in the 80s and 11/09    Past Surgical History:  Procedure Laterality Date  . CARDIAC CATHETERIZATION  10/2008   stents  . CHOLECYSTECTOMY N/A 07/16/2016   Procedure: LAPAROSCOPIC CHOLECYSTECTOMY WITH INTRAOPERATIVE CHOLANGIOGRAM;  Surgeon: [name], MD;  Location: [place];  Service: General;  Laterality: N/A;  . CORONARY STENT INTERVENTION N/A 09/20/2019   Procedure: CORONARY STENT INTERVENTION;  Surgeon: Burnell Blanks, MD;  Location: [place] [facility];  Service: Cardiovascular;  Laterality: N/A;  . LEFT HEART CATH AND CORONARY ANGIOGRAPHY N/A 09/20/2019   Procedure: LEFT HEART CATH AND CORONARY ANGIOGRAPHY;   Surgeon: Burnell Blanks, MD;  Location: [place] [facility];  Service: Cardiovascular;  Laterality: N/A;  . SEPTOPLASTY     w/ bilateral inferior turbinate reductions  . TONSILLECTOMY      Allergies as of 01/14/2020      Reactions   Januvia [sitagliptin] Nausea Only, Other (See Comments)   Back Pain      Medication List       Accurate as of January 14, 2020  8:30 AM. If you have any questions, ask your nurse or doctor.        aspirin EC 81 MG tablet Take 81 mg by mouth at bedtime.   blood glucose meter kit and supplies Dispense based on patient and insurance preference. Check blood sugar once daily   carvedilol 6.25 MG tablet Commonly known as: COREG Take 1 tablet (6.25 mg total) by mouth 2 (two) times daily with a meal.   cetirizine 10 MG tablet Commonly known as: ZYRTEC Take 10 mg by mouth daily.   clopidogrel 75 MG tablet Commonly known as: PLAVIX Take 1 tablet (75 mg total) by mouth daily with breakfast.   ezetimibe-simvastatin 10-10 MG tablet Commonly known as: VYTORIN TAKE 1 TABLET DAILY   Farxiga 10 MG Tabs tablet Generic drug: dapagliflozin propanediol Take 10 mg by mouth daily.   glipiZIDE 10 MG 24 hr tablet Commonly known as: GLUCOTROL XL Take 1 tablet (  10 mg total) by mouth daily with breakfast.   guaiFENesin 600 MG 12 hr tablet Commonly known as: MUCINEX Take by mouth 2 (two) times daily.   lisinopril 20 MG tablet Commonly known as: ZESTRIL Take 1 tablet (20 mg total) by mouth daily.   multivitamin tablet Take 1 tablet by mouth daily.   nitroGLYCERIN 0.4 MG SL tablet Commonly known as: NITROSTAT Place 1 tablet (0.4 mg total) under the tongue every 5 (five) minutes x 3 doses as needed for chest pain.   OneTouch Ultra test strip Generic drug: glucose blood CHECK BLOOD SUGAR 1 DAILY   pantoprazole 40 MG tablet Commonly known as: PROTONIX Take 1 tablet (40 mg total) by mouth 2 (two) times daily before a meal.   pioglitazone-metformin  15-850 MG tablet Commonly known as: ACTOPLUS MET Take 1 tablet by mouth 2 (two) times daily with a meal.   Potassium 99 MG Tabs Take 99 mg by mouth at bedtime.   Tradjenta 5 MG Tabs tablet Generic drug: linagliptin TAKE 1 TABLET DAILY          Objective:   Physical Exam HENT:     Head:   Skin:        BP 121/63 (BP Location: Left Arm, Patient Position: Sitting, Cuff Size: Normal)   Pulse (!) 58   Temp (!) 97 F (36.1 C) (Temporal)   Resp 18   Ht '5\' 11"'$  (1.803 m)   Wt 262 lb 8 oz (119.1 kg)   SpO2 98%   BMI 36.61 kg/m  General:   Well developed, NAD, BMI noted.  HEENT:  Normocephalic . Face symmetric, atraumatic Lungs:  CTA B Normal respiratory effort, no intercostal retractions, no accessory muscle use. Heart: RRR,  no murmur.  Abdomen:  Not distended, soft, non-tender. No rebound or rigidity.   Skin: Not pale. Not jaundice Lower extremities: Trace pretibial edema bilaterally  Neurologic:  alert & oriented X3.  Speech normal, gait appropriate for age and unassisted Psych--  Cognition and judgment appear intact.  Cooperative with normal attention span and concentration.  Behavior appropriate. No anxious or depressed appearing.     Assessment    Assessment DM DX 2008 HTN Hyperlipidemia CAD:  -MI 2001, syncope 2010 -07-2017: SOB, low risk stress test -Catheterization 09/2019: one stent, DAPT for 6 months OSA, CPAP intolerant ED Mild chronic anemia, low iron stores. Saw GI 04-2017: no further w/u  GERD Gallbladder polyps, 2014, surgery 07-2016 Fatty liver 2010 Used to see  Dr Cena Benton DM: Currently on Farxiga, glipizide, Actos plus met, Tradjenta.  Last A1c 6.9, goal 6.5. Check A1c. HTN: Very good BP today, he checks his blood pressures for a while and they were always within normal so he quit.  Were checking a BMP, continue carvedilol, lisinopril. CAD: Since the last visit, hot a cardiac cath, 09/20/2019, 1 stent, seems to be doing well,  on DAPT for 6 months, has a follow-up with cardiology scheduled. High cholesterol: On Vytorin, checking a FLP Skin lesions: Refer to dermatology, likes to see somebody near this office, used to see a practice in [place]. Preventive care, had two Covid shot RTC CPX 5 months    This visit occurred during the SARS-CoV-2 public health emergency.  Safety protocols were in place, including screening questions prior to the visit, additional usage of staff PPE, and extensive cleaning of exam room while observing appropriate contact time as indicated for disinfecting solutions.

## 2020-01-14 NOTE — Progress Notes (Signed)
Pre visit review using our clinic review tool, if applicable. No additional management support is needed unless otherwise documented below in the visit note. 

## 2020-01-16 NOTE — Assessment & Plan Note (Signed)
DM: Currently on Farxiga, glipizide, Actos plus met, Tradjenta.  Last A1c 6.9, goal 6.5. Check A1c. HTN: Very good BP today, he checks his blood pressures for a while and they were always within normal so he quit.  Were checking a BMP, continue carvedilol, lisinopril. CAD: Since the last visit, hot a cardiac cath, 09/20/2019, 1 stent, seems to be doing well, on DAPT for 6 months, has a follow-up with cardiology scheduled. High cholesterol: On Vytorin, checking a FLP Skin lesions: Refer to dermatology, likes to see somebody near this office, used to see a practice in [place]. Preventive care, had two Covid shot RTC CPX 5 months

## 2020-01-18 DIAGNOSIS — L821 Other seborrheic keratosis: Secondary | ICD-10-CM | POA: Diagnosis not present

## 2020-01-18 DIAGNOSIS — L309 Dermatitis, unspecified: Secondary | ICD-10-CM | POA: Diagnosis not present

## 2020-01-18 DIAGNOSIS — L570 Actinic keratosis: Secondary | ICD-10-CM | POA: Diagnosis not present

## 2020-02-07 DIAGNOSIS — R69 Illness, unspecified: Secondary | ICD-10-CM | POA: Diagnosis not present

## 2020-02-08 DIAGNOSIS — Z20822 Contact with and (suspected) exposure to covid-19: Secondary | ICD-10-CM | POA: Diagnosis not present

## 2020-02-08 DIAGNOSIS — J029 Acute pharyngitis, unspecified: Secondary | ICD-10-CM | POA: Diagnosis not present

## 2020-02-08 DIAGNOSIS — R0981 Nasal congestion: Secondary | ICD-10-CM | POA: Diagnosis not present

## 2020-02-08 IMAGING — MR MR LUMBAR SPINE W/O CM
4 of 5 series · 17 of 48 positions shown · non-contrast
Comparison: None.

CLINICAL DATA: Tightness of the low back. Bilateral leg pain and
weakness.

EXAM:
MRI LUMBAR SPINE WITHOUT CONTRAST
TECHNIQUE: Multiplanar, multisequence MR imaging of the lumbar spine was
performed. No intravenous contrast was administered.

[Series 5: T2 · sagittal · 4.0mm · 0.73mm/px · 6 of 17 slices shown (1 of 2)]
[im 1/17]
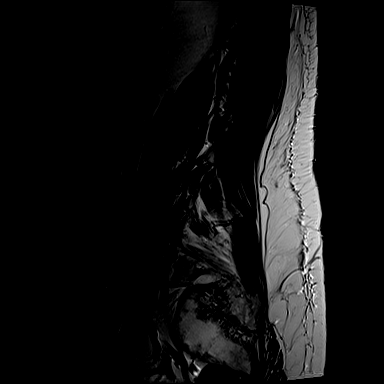
[im 4/17]
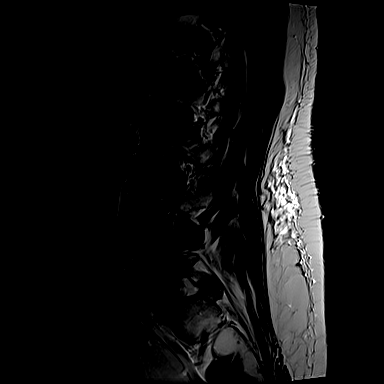
[im 7/17]
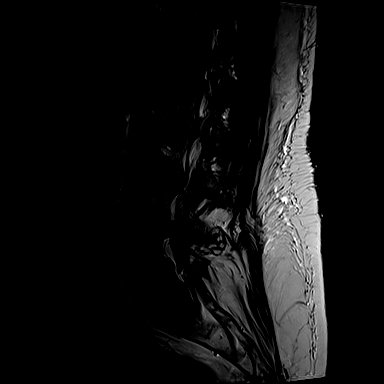
[im 10/17]
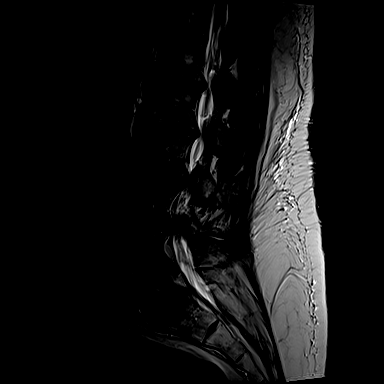
[im 13/17]
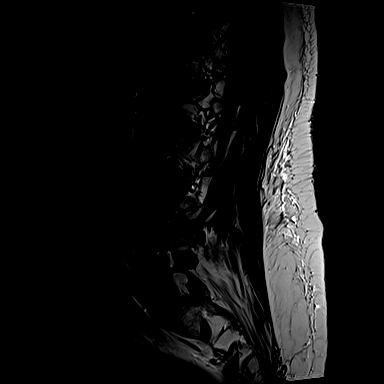
[im 17/17]
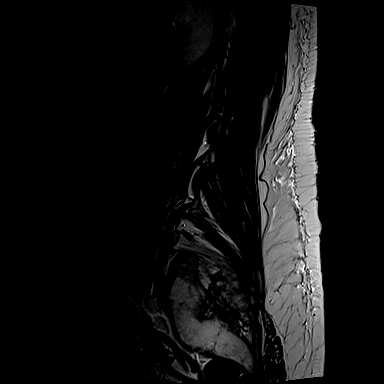

[Series 7: T1 · sagittal · 4.0mm · 0.73mm/px · 3 of 17 slices shown (1 of 2)]
[im 4/17]
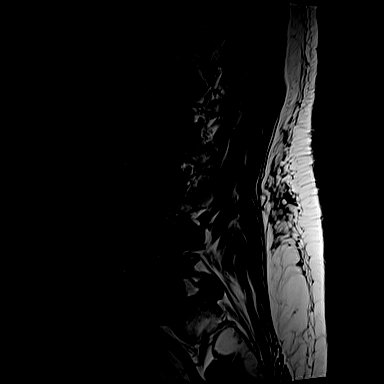
[im 10/17]
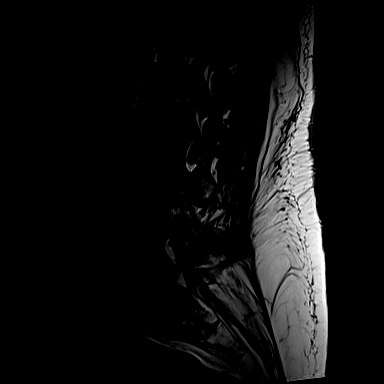
[im 17/17]
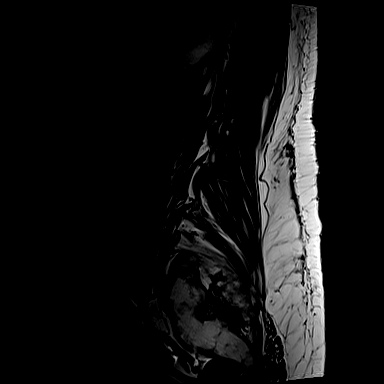

[Series 10: T1 · axial · 4.0mm · 0.28mm/px · z∈[-77,+96]mm · 3 of 40 slices shown (2 of 2)]
[im 6/40]
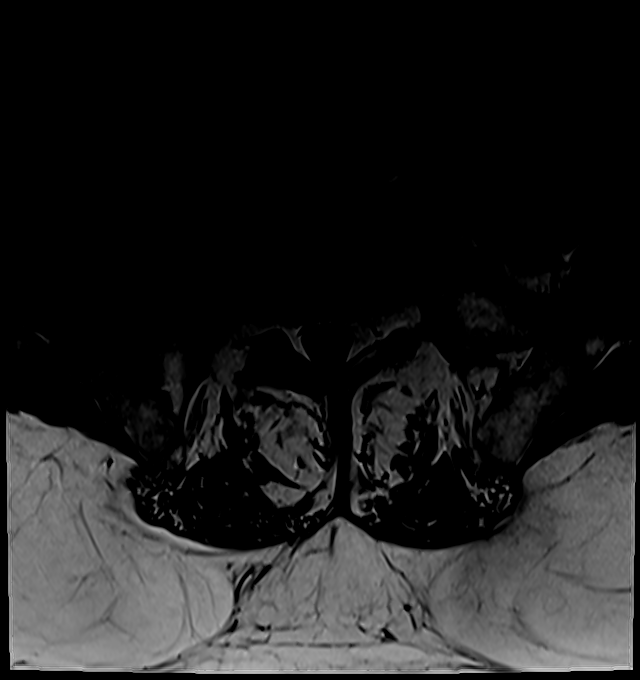
[im 20/40]
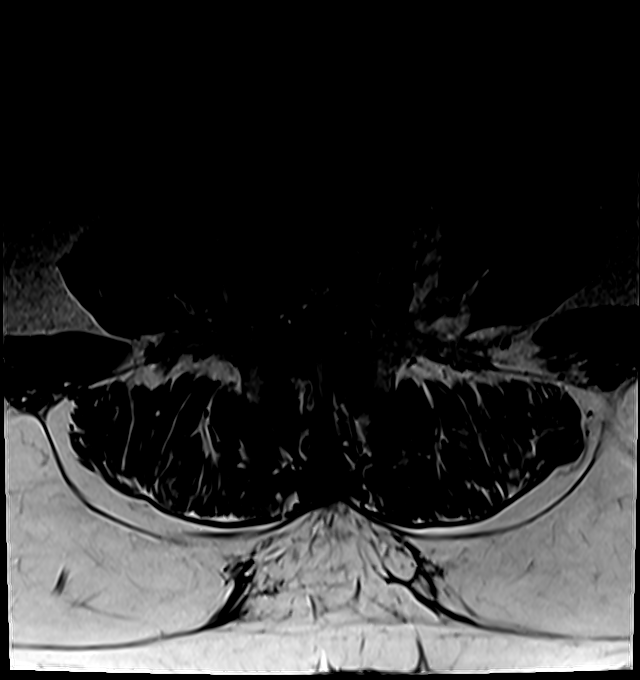
[im 34/40]
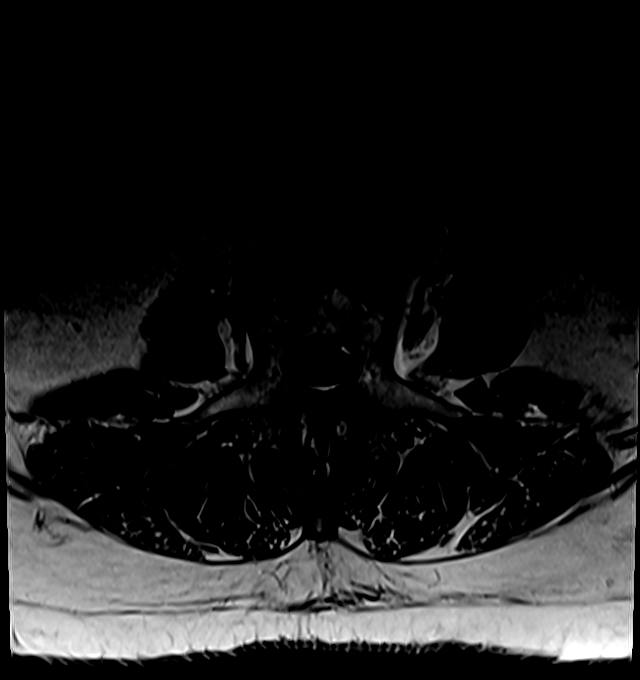

[Series 13: T2 · axial · 4.0mm · 0.28mm/px · z∈[-100,+96]mm · 5 of 40 slices shown (2 of 2)]
[im 1/40]
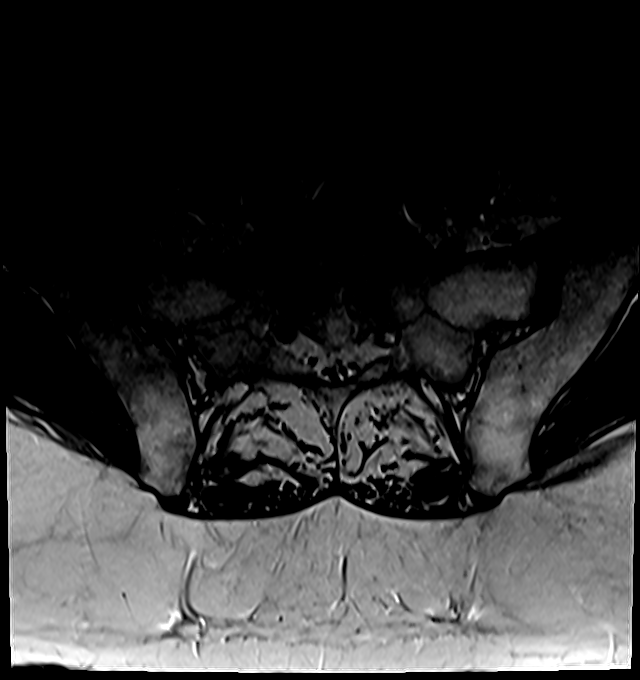
[im 6/40]
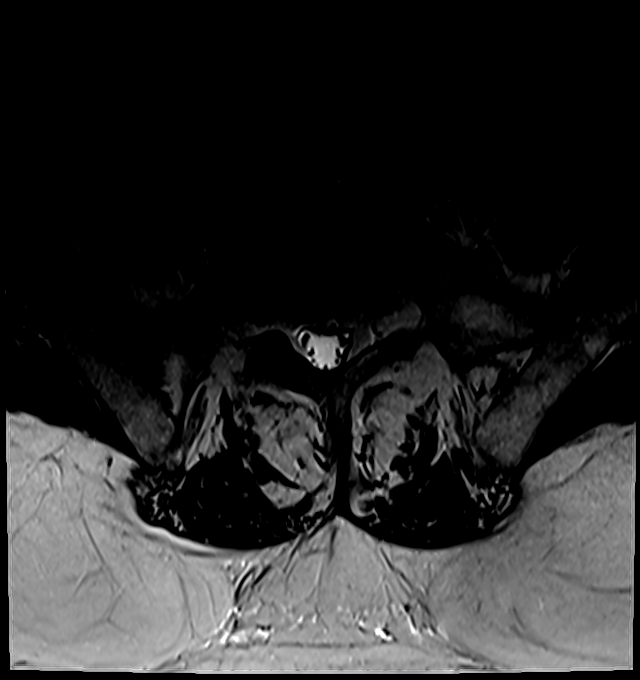
[im 12/40]
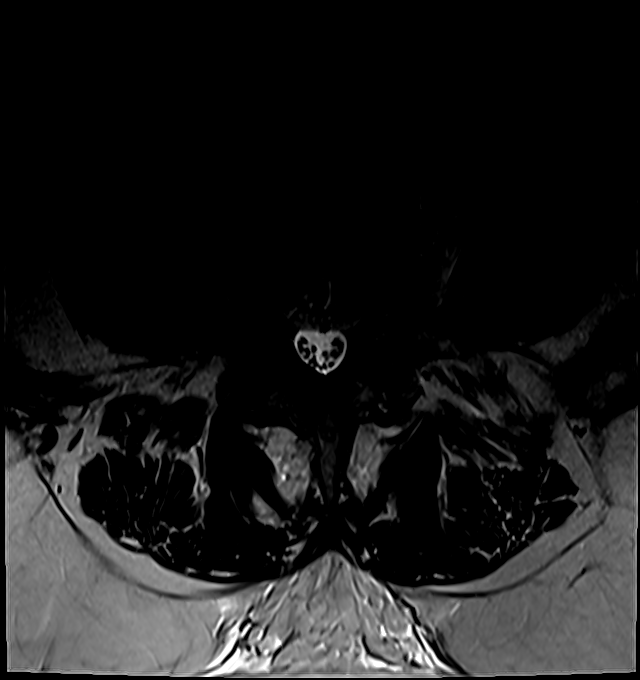
[im 20/40]
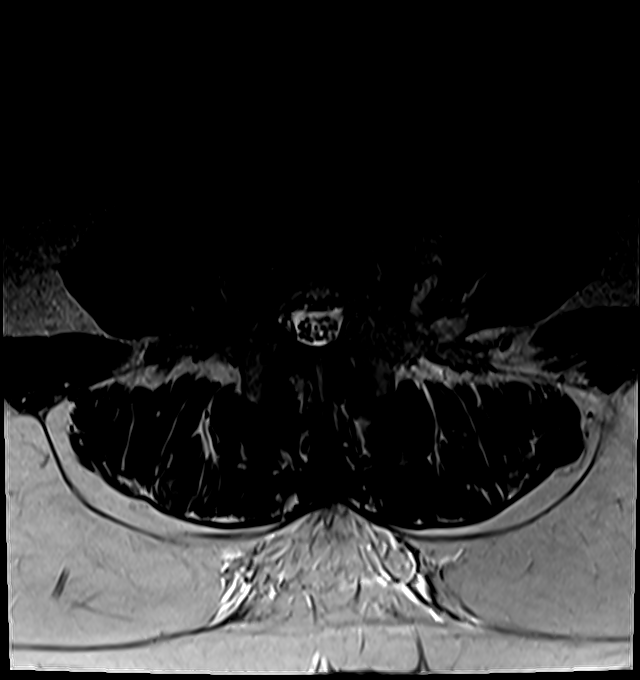
[im 34/40]
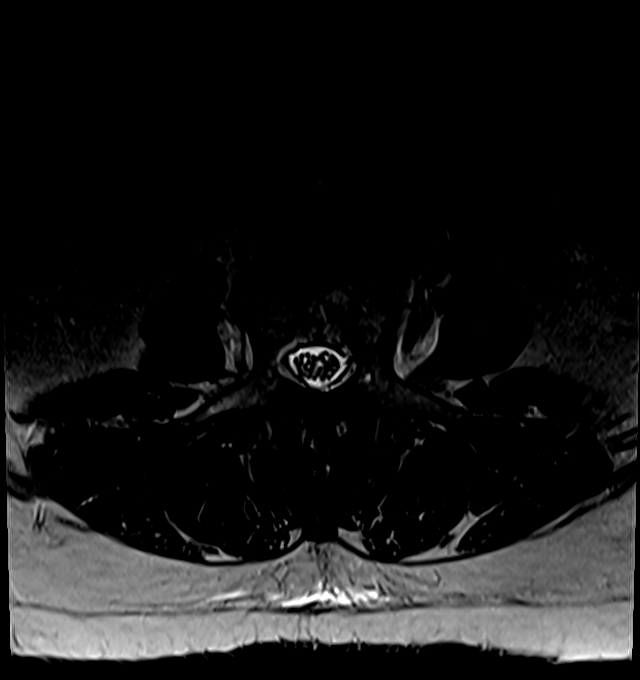

[17 of 48 positions shown; findings below may reference images not displayed]

FINDINGS: Segmentation: 5 lumbar type vertebral bodies. S1 is a transitional
vertebra.

Alignment:  2 mm retrolisthesis L3-4.  3 mm retrolisthesis L4-5.

Vertebrae: Anomalous segmentation at the L5-S1 level with failure of
separation on the right. Anomalous cleft within the transitional S1
segment.

Conus medullaris and cauda equina: Conus extends to the L1 level.
Conus and cauda equina appear normal.

Paraspinal and other soft tissues: Negative

Disc levels:

T12-L1: Normal.

L1-2: Disc degeneration with loss of disc height. Endplate
osteophytes and circumferential bulging of the disc. Mild facet and
ligamentous hypertrophy. Multifactorial stenosis with crowding of
the nerve roots. Definite focal neural compression is not
demonstrated, but this could be symptomatic stenosis.

L2-3: Circumferential bulging of the disc. Facet and ligamentous
hypertrophy. Crowding of the nerve roots with effacement of the
subarachnoid space. As above, definite focal nerve compression is
not established, but the stenosis could be symptomatic.

L3-4: Shallow protrusion of the disc more prominent in the left
posterolateral direction. Facet and ligamentous hypertrophy.
Abundant epidural fat. Constriction of the thecal sac with
effacement of the subarachnoid space. This could be symptomatic
stenosis. Additionally, focal neural compression could occur in the
left lateral recess and intervertebral foramen on the left.

L4-5: Endplate osteophytes and bulging of the disc. Facet and
ligamentous hypertrophy. Multifactorial spinal stenosis most
pronounced in the lateral recesses and foramina left more than
right. Neural compression could occur at this level, particularly on
the left.

L5-S1: Endplate osteophytes and mild bulging of the disc. Anomalous
segmentation particularly on the right. Sufficient patency of the
canal and foramina.

S1-2: Anomalous segmentation.  No canal or foraminal stenosis.
IMPRESSION: Multifactorial spinal stenosis at L1-2 and L2-3 which could be
symptomatic, though distinct focal neural compression is not
demonstrated.

Multifactorial spinal stenosis at L3-4, more pronounced on the left
due to asymmetric protrusion of the disc towards the left. In
addition to the overall stenosis, left lateral recess and foraminal
stenosis could be symptomatic.

Multifactorial stenosis at L4-5 worse on the left than the right
that could cause neural compression in the lateral recesses and
foramina, more likely on the left.

Segmentation anomaly at L5 and S1 as outlined above. No compressive
narrowing of the canal or foramina in that region. This is probably
not of acute significance, but could relate to the degenerative
changes throughout the lumbar region

## 2020-02-16 NOTE — Progress Notes (Deleted)
CARDIOLOGY OFFICE NOTE  Date:  02/16/2020    [name] Date of Birth: 08-Nov-1950 Medical Record [id number]  PCP:  Colon Branch, MD  Cardiologist:  Servando Snare & ***    No chief complaint on file.   History of Present Illness: Daniel Cruz is a 69 y.o. male who presents today for a *** Seen for Dr. Radford Pax.  Daniel Cruz has a history of CAD with remote non-STEMI in 2001 with PCI of the mid and distal RCA with 50 to 60% proximal LAD and subsequent PCI of an 80% LAD and right PDA for 2010. Other history includes hypertension, hyperlipidemia, DM2, GERD and OSA.  Last seen by Dr. Radford Pax in October 2020 - BP meds were changed. He then sent a MyChart message 08/2019 and noted shortness of breath and a "bubble "sensation in his chest with walking and she recommended a Myoview stress test. Study showed prior scarring in the distal LAD, noted to be larger than prior studies with recommendations for follow-up in the office to discuss cardiac catheterization.  I then saw him in early December - given symptoms and findings on Myoview - cardiac cath was recommended. This showed patent stents in thep/mRCA with mild to moderate restenosis that was not flow-limiting, CTO of themLAD with collaterals, along with new severe stenosis in the intermediate branch. Successful PCI/DES x1 was placed. Plan was for DAPT with ASA and Plavix for at least 6 months. Noted if he continued to have dyspnea, could be considered for CTO PCI of the LAD. Discussed about higher intensity statin and having Jardiance added as outpatient.   He saw Kathyrn Drown, NP back for his post cath visit - had a "head cold". Using Mucinex. Otherwise, felt to be doing ok.   The patient does not have symptoms concerning for COVID-19 infection (fever, chills, cough, or new shortness of breath).   Comes in today. Here alone. He notes he is feeling better. He took the Mucinex - his head cold is improved. Will have  some shortness of breath "out of the blue" - nothing like what he has had in the past. He notes this is "100% better". No chest pain. He has previously has had some chest tightness - does not have any of this as well. He likes walking up stairs. He knows he needs to work on his weight. He would like a COVID vaccine - he is in [employer]. He remains on DAPT.    The patient {does/does not:200015} have symptoms concerning for COVID-19 infection (fever, chills, cough, or new shortness of breath).   Comes in today. Here with   Past Medical History:  Diagnosis Date  . Anemia, iron deficiency    hx  . CAD (coronary artery disease)     NSTEMI with PCI of the mid and distal RCA with 50-60% LAD 09/2000, s/p cath 01/2009 with patent RCA stent and 90% LAD s/p PCI of LAD and right PDA  . Cholelithiasis   . DIABETES MELLITUS, TYPE II 02/26/2007  . Fatty liver 02/2009   per u/s  . GERD (gastroesophageal reflux disease)   . HTN (hypertension)   . Myocardial infarction ([place])    2001  . OSA (obstructive sleep apnea)    can't tol. a CPAP  . Plantar fasciitis of left foot   . PVC (premature ventricular contraction) 06/06/2017  . Sleep apnea   . UTI (lower urinary tract infection)    in the 80s and 11/09  Past Surgical History:  Procedure Laterality Date  . CARDIAC CATHETERIZATION  10/2008   stents  . CHOLECYSTECTOMY N/A 07/16/2016   Procedure: LAPAROSCOPIC CHOLECYSTECTOMY WITH INTRAOPERATIVE CHOLANGIOGRAM;  Surgeon: [name], MD;  Location: [facility];  Service: General;  Laterality: N/A;  . CORONARY STENT INTERVENTION N/A 09/20/2019   Procedure: CORONARY STENT INTERVENTION;  Surgeon: Burnell Blanks, MD;  Location: [facility];  Service: Cardiovascular;  Laterality: N/A;  . LEFT HEART CATH AND CORONARY ANGIOGRAPHY N/A 09/20/2019   Procedure: LEFT HEART CATH AND CORONARY ANGIOGRAPHY;  Surgeon: Burnell Blanks, MD;  Location: [facility];  Service: Cardiovascular;   Laterality: N/A;  . SEPTOPLASTY     w/ bilateral inferior turbinate reductions  . TONSILLECTOMY       Medications: No outpatient medications have been marked as taking for the 02/22/20 encounter (Appointment) with Burtis Junes, NP.     Allergies: Allergies  Allergen Reactions  . Januvia [Sitagliptin] Nausea Only and Other (See Comments)    Back Pain    Social History: The patient  reports that he quit smoking about 19 years ago. He has a 24.00 pack-year smoking history. He has never used smokeless tobacco. He reports current alcohol use. He reports that he does not use drugs.   Family History: The patient's ***family history includes COPD in his mother; Coronary artery disease in his father; Emphysema in his mother; Stroke in his father.   Review of Systems: Please see the history of present illness.   All other systems are reviewed and negative.   Physical Exam: VS:  There were no vitals taken for this visit. Marland Kitchen  BMI There is no height or weight on file to calculate BMI.  Wt Readings from Last 3 Encounters:  01/14/20 262 lb 8 oz (119.1 kg)  10/19/19 266 lb (120.7 kg)  10/05/19 255 lb (115.7 kg)    General: Pleasant. Well developed, well nourished and in no acute distress.   HEENT: Normal.  Neck: Supple, no JVD, carotid bruits, or masses noted.  Cardiac: ***Regular rate and rhythm. No murmurs, rubs, or gallops. No edema.  Respiratory:  Lungs are clear to auscultation bilaterally with normal work of breathing.  GI: Soft and nontender.  MS: No deformity or atrophy. Gait and ROM intact.  Skin: Warm and dry. Color is normal.  Neuro:  Strength and sensation are intact and no gross focal deficits noted.  Psych: Alert, appropriate and with normal affect.   LABORATORY DATA:  EKG:  EKG {ACTION; IS/IS GI:[id number] ordered today.  Personally reviewed by me. This demonstrates ***.  Lab Results  Component Value Date   WBC 6.2 09/15/2019   HGB 12.7 (L) 09/15/2019   HCT  37.5 09/15/2019   PLT 215 09/15/2019   GLUCOSE 79 01/14/2020   CHOL 104 01/14/2020   TRIG 90.0 01/14/2020   HDL 41.90 01/14/2020   LDLCALC 44 01/14/2020   ALT 18 09/15/2019   AST 27 09/15/2019   NA 139 01/14/2020   K 4.7 01/14/2020   CL 106 01/14/2020   CREATININE 0.91 01/14/2020   BUN 16 01/14/2020   CO2 25 01/14/2020   TSH 1.340 06/06/2017   PSA 1.40 06/15/2019   INR 1.05 07/14/2013   HGBA1C 6.5 01/14/2020   MICROALBUR 0.5 10/20/2009     BNP (last 3 results) No results for input(s): BNP in the last 8760 hours.  ProBNP (last 3 results) No results for input(s): PROBNP in the last 8760 hours.   Other Studies  Reviewed Today:   Assessment/Plan: Cardiac Catheterization12/14/2020:   Prox RCA lesion is 40% stenosed.  RPDA lesion is 30% stenosed.  Ramus lesion is 80% stenosed.  Mid Cx to Dist Cx lesion is 30% stenosed with 30% stenosed side branch in 2nd Mrg.  Mid LAD-1 lesion is 100% stenosed.  Mid LAD-2 lesion is 100% stenosed.  A drug-eluting stent was successfully placed using a STENT RESOLUTE ONYX [id number].  Post intervention, there is a 0% residual stenosis.  The left ventricular systolic function is normal.  LV end diastolic pressure is normal.  The left ventricular ejection fraction is 55-65% by visual estimate.  There is no mitral valve regurgitation.  1. Patent stents proximal and mid RCA with mild to moderate restenosis which does not appear to be flow limiting.  2. Chronic occlusion of the mid LAD. The mid and distal LAD fills from right to left collaterals.  3. Mild non-obstructive disease in the Circumflex artery 4. Severe stenosis in the moderate caliber intermediate branch.  5. Preserved LV systolic function 6. Successful PTCA/DES x 1 intermediate branch  Recommendations: Continue ASA and Plavix for at least 6 months. If he has continued dyspnea, could consider CTO PCI of the LAD. If this is the case, he would need to have his cath films  reviewed by the CTO team. Same day PCI discharge.   Diagnostic Dominance: Right  Intervention     ASSESSMENT AND PLAN:  1. CAD s/p PCI: -Cardiac cath as above with patent stents in thep/mRCA with mild to moderate restenosis that was not flow-limiting, CTO of themLAD with collaterals, along with new severe stenosis in the intermediate branch. Successful PCI/DES x1 was placed.  -Plan was for DAPT with ASA and Plavix for at least 6 months. Noted if he continued to have dyspnea, could be considered for CTO PCI of the LAD.  He continues to do very well. His breathing is 100% better. He has no chest tightness. I have left him on his current regimen. I explained that we would push medical therapy - and if then fails would consider CTO - but this is not needed at this time.   2. HTN - BP is fine. No changes made today.   3. HLD - on statin - lab by PCP. LDL is at goal.   4. DM - per PCP  5. Prior tobacco use . COVID-19 Education: The signs and symptoms of COVID-19 were discussed with the patient and how to seek care for testing (follow up with PCP or arrange E-visit).  The importance of social distancing, staying at home, hand hygiene and wearing a mask when out in public were discussed today.  Current medicines are reviewed with the patient today.  The patient does not have concerns regarding medicines other than what has been noted above.  The following changes have been made:  See above.  Labs/ tests ordered today include:   No orders of the defined types were placed in this encounter.    Disposition:   FU with *** in {gen number [id number] {Days to years:10300}.   Patient is agreeable to this plan and will call if any problems develop in the interim.   SignedTruitt Merle, NP  02/16/2020 7:59 AM  [facility] [address] [place], [place]  [address] Phone: [phone] Fax: [phone]

## 2020-02-22 ENCOUNTER — Ambulatory Visit: Payer: Medicare HMO | Admitting: Nurse Practitioner

## 2020-02-29 NOTE — Progress Notes (Signed)
Cardiology Office Note:    Date:  03/01/2020   ID:  Daniel Cruz, DOB [birth year], MRN [id number]  PCP:  Colon Branch, MD  Cardiologist:  Fransico Him, MD    Referring MD: Colon Branch, MD   Chief Complaint  Patient presents with  . Coronary Artery Disease  . Hypertension  . Hyperlipidemia  . Sleep Apnea    History of Present Illness:    Daniel Cruz is a 69 y.o. male with a hx of ASCAD with NSTEMI 2001 with PCI of the mid and distal RCA with 50-60% prox LAD and subsequent PCI of 80% LAD and right PDA 01/2009, diabetes, hypertension, hyperlipidemia, obstructive sleep apnea and GERD.    In 08/2019 he started having DOE with walking and stress myoview showed scarring in the distal LAD territory that was larger than prior studies.  Cardiac cath was done showing patent stents in thep/mRCA with mild to moderate restenosis that was not flow-limiting, CTO of themLAD with collaterals, along with new severe stenosis in the intermediate branch. Successful PCI/DES x1 was placed. Plan was for DAPT with ASA and Plavix for at least 6 months. Noted if he continued to have dyspnea, could be considered for CTO PCI of the LAD. Discussed about higher intensity statin and having Jardiance added as outpatient. Seen by Truitt Merle, NP 10/2019 and feeling better.  He is here today for followup and is doing well.  He denies any chest pain or pressure, SOB, DOE, PND, orthopnea, LE edema, dizziness, palpitations or syncope. He is compliant with his meds and is tolerating meds with no SE.      Past Medical History:  Diagnosis Date  . Anemia, iron deficiency    hx  . CAD (coronary artery disease)     NSTEMI with PCI of the mid and distal RCA with 50-60% LAD 09/2000, s/p cath 01/2009 with patent RCA stent and 90% LAD s/p PCI of LAD and right PDA  . Cholelithiasis   . DIABETES MELLITUS, TYPE II 02/26/2007  . Fatty liver 02/2009   per u/s  . GERD (gastroesophageal reflux disease)   . HTN  (hypertension)   . Myocardial infarction ([place])    2001  . OSA (obstructive sleep apnea)    can't tol. a CPAP  . Plantar fasciitis of left foot   . PVC (premature ventricular contraction) 06/06/2017  . Sleep apnea   . UTI (lower urinary tract infection)    in the 80s and 11/09    Past Surgical History:  Procedure Laterality Date  . CARDIAC CATHETERIZATION  10/2008   stents  . CHOLECYSTECTOMY N/A 07/16/2016   Procedure: LAPAROSCOPIC CHOLECYSTECTOMY WITH INTRAOPERATIVE CHOLANGIOGRAM;  Surgeon: [name], MD;  Location: [facility];  Service: General;  Laterality: N/A;  . CORONARY STENT INTERVENTION N/A 09/20/2019   Procedure: CORONARY STENT INTERVENTION;  Surgeon: Burnell Blanks, MD;  Location: [facility];  Service: Cardiovascular;  Laterality: N/A;  . LEFT HEART CATH AND CORONARY ANGIOGRAPHY N/A 09/20/2019   Procedure: LEFT HEART CATH AND CORONARY ANGIOGRAPHY;  Surgeon: Burnell Blanks, MD;  Location: [facility];  Service: Cardiovascular;  Laterality: N/A;  . SEPTOPLASTY     w/ bilateral inferior turbinate reductions  . TONSILLECTOMY      Current Medications: Current Meds  Medication Sig  . aspirin EC 81 MG tablet Take 81 mg by mouth at bedtime.   . blood glucose meter kit and supplies Dispense based on patient and insurance preference. Check  blood sugar once daily  . carvedilol (COREG) 6.25 MG tablet Take 1 tablet (6.25 mg total) by mouth 2 (two) times daily with a meal.  . cetirizine (ZYRTEC) 10 MG tablet Take 10 mg by mouth daily.  . clopidogrel (PLAVIX) 75 MG tablet Take 1 tablet (75 mg total) by mouth daily with breakfast.  . dapagliflozin propanediol (FARXIGA) 10 MG TABS tablet Take 10 mg by mouth daily.  Marland Kitchen ezetimibe-simvastatin (VYTORIN) 10-10 MG tablet TAKE 1 TABLET DAILY  . glipiZIDE (GLUCOTROL XL) 10 MG 24 hr tablet Take 1 tablet (10 mg total) by mouth daily with breakfast.  . guaiFENesin (MUCINEX) 600 MG 12 hr tablet Take by mouth 2 (two)  times daily.  Marland Kitchen lisinopril (ZESTRIL) 20 MG tablet Take 1 tablet (20 mg total) by mouth daily.  . Multiple Vitamin (MULTIVITAMIN) tablet Take 1 tablet by mouth daily.    . nitroGLYCERIN (NITROSTAT) 0.4 MG SL tablet Place 1 tablet (0.4 mg total) under the tongue every 5 (five) minutes x 3 doses as needed for chest pain.  Glory Rosebush ULTRA test strip CHECK BLOOD SUGAR 1 DAILY  . pantoprazole (PROTONIX) 40 MG tablet Take 1 tablet (40 mg total) by mouth 2 (two) times daily before a meal.  . pioglitazone-metformin (ACTOPLUS MET) 15-850 MG tablet Take 1 tablet by mouth 2 (two) times daily with a meal.  . Potassium 99 MG TABS Take 99 mg by mouth at bedtime.  . TRADJENTA 5 MG TABS tablet TAKE 1 TABLET DAILY     Allergies:   Januvia [sitagliptin]   Social History   Socioeconomic History  . Marital status: Married    Spouse name: Not on file  . Number of children: 2  . Years of education: Not on file  . Highest education level: Not on file  Occupational History  . 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 Smoking status: Former Smoker    Packs/day: 1.50    Years: 16.00    Pack years: 24.00    Quit date: 07/08/2000    Years since quitting: 19.6  . Smokeless tobacco: Never Used  . Tobacco comment: 2001  Substance and Sexual Activity  . Alcohol use: Yes    Comment: socially  . Drug use: No  . Sexual activity: Yes  Other Topics Concern  . Not on file  Social History Narrative   Lives in [place], [place]   Lives w/ wife, 2 children, 2 g-kids   Retired April 2017          Social Determinants of Health   Financial Resource Strain:   . Difficulty of Paying Living Expenses:   Food Insecurity:   . Worried About Charity fundraiser in the Last Year:   . Arboriculturist in the Last Year:   Transportation Needs:   . Film/video editor (Medical):   Marland Kitchen Lack of Transportation (Non-Medical):   Physical Activity:   . Days of  Exercise per Week:   . Minutes of Exercise per Session:   Stress:   . Feeling of Stress :   Social Connections:   . Frequency of Communication with Friends and Family:   . Frequency of Social Gatherings with Friends and Family:   . Attends Religious Services:   . Active Member of Clubs or Organizations:   . Attends Archivist Meetings:   Marland Kitchen Marital Status:      Family History: The patient's family history  includes COPD in his mother; Coronary artery disease in his father; Emphysema in his mother; Stroke in his father. There is no history of Cancer, Diabetes, Colon cancer, Esophageal cancer, Rectal cancer, or Stomach cancer.  ROS:   Please see the history of present illness.    ROS  All other systems reviewed and negative.   EKGs/Labs/Other Studies Reviewed:    The following studies were reviewed today:  09/2019 Cardiac Cath: Conclusion    Prox RCA lesion is 40% stenosed.  RPDA lesion is 30% stenosed.  Ramus lesion is 80% stenosed.  Mid Cx to Dist Cx lesion is 30% stenosed with 30% stenosed side branch in 2nd Mrg.  Mid LAD-1 lesion is 100% stenosed.  Mid LAD-2 lesion is 100% stenosed.  A drug-eluting stent was successfully placed using a STENT RESOLUTE ONYX [id number].  Post intervention, there is a 0% residual stenosis.  The left ventricular systolic function is normal.  LV end diastolic pressure is normal.  The left ventricular ejection fraction is 55-65% by visual estimate.  There is no mitral valve regurgitation.   1. Patent stents proximal and mid RCA with mild to moderate restenosis which does not appear to be flow limiting.  2. Chronic occlusion of the mid LAD. The mid and distal LAD fills from right to left collaterals.  3. Mild non-obstructive disease in the Circumflex artery 4. Severe stenosis in the moderate caliber intermediate branch.  5. Preserved LV systolic function 6. Successful PTCA/DES x 1 intermediate branch  Recommendations:  Continue ASA and Plavix for at least 6 months. If he has continued dyspnea, could consider CTO PCI of the LAD. If this is the case, he would need to have his cath films reviewed by the CTO team. Same day PCI discharge.    PAP compliance downlaod   EKG:  EKG is no ordered today  Recent Labs: 09/15/2019: ALT 18; Hemoglobin 12.7; Platelets 215 01/14/2020: BUN 16; Creatinine, Ser 0.91; Potassium 4.7; Sodium 139   Recent Lipid Panel    Component Value Date/Time   CHOL 104 01/14/2020 0849   TRIG 90.0 01/14/2020 0849   TRIG 106 09/15/2006 0947   HDL 41.90 01/14/2020 0849   CHOLHDL 2 01/14/2020 0849   VLDL 18.0 01/14/2020 0849   LDLCALC 44 01/14/2020 0849    Physical Exam:    VS:  BP 124/62   Pulse 63   Ht 6' (1.829 m)   Wt 258 lb (117 kg)   SpO2 98%   BMI 34.99 kg/m     Wt Readings from Last 3 Encounters:  03/01/20 258 lb (117 kg)  01/14/20 262 lb 8 oz (119.1 kg)  10/19/19 266 lb (120.7 kg)     GEN: Well nourished, well developed in no acute distress HEENT: Normal NECK: No JVD; No carotid bruits LYMPHATICS: No lymphadenopathy CARDIAC:RRR, no murmurs, rubs, gallops RESPIRATORY:  Clear to auscultation without rales, wheezing or rhonchi  ABDOMEN: Soft, non-tender, non-distended MUSCULOSKELETAL:  No edema; No deformity  SKIN: Warm and dry NEUROLOGIC:  Alert and oriented x 3 PSYCHIATRIC:  Normal affect    ASSESSMENT:    1. Coronary artery disease involving native coronary artery of native heart without angina pectoris   2. Essential hypertension   3. Pure hypercholesterolemia    PLAN:    In order of problems listed above:  1.  ASCAD -s/p NSTEMI 2001 with PCI of the mid and distal RCA with 50-60% prox LAD and subsequent PCI of 80% LAD and right PDA. -cath done 09/2019 showed  patent stents in thep/mRCA with mild to moderate restenosis that was not flow-limiting, CTO of themLAD with collaterals, along with new severe stenosis in the intermediate branch s/p PCI/DES x1  was placed.  -Plan was for DAPT with ASA and Plavix for at least 6 months with consideration of CTO PCI of the LAD if he continued to have DOE.  -he has not had any anginal sx -continue ASA '81mg'$  daily, BB and statin  2.  HTN -BP controlled on exam today -continue Lisinopril '20mg'$  daily and Carvedilol 6.'25mg'$  BID -creatinine was 0.91 and K+ 4.7 in April 2021  3.  HLD -LDL goal is < 70 -LDL 44 in April 2021 -continue Vytorin 10-'10mg'$  daily   Medication Adjustments/Labs and Tests Ordered: Current medicines are reviewed at length with the patient today.  Concerns regarding medicines are outlined above.  No orders of the defined types were placed in this encounter.  No orders of the defined types were placed in this encounter.   Signed, Fransico Him, MD  03/01/2020 9:58 AM    [facility]

## 2020-03-01 ENCOUNTER — Ambulatory Visit: Payer: Medicare HMO | Admitting: Cardiology

## 2020-03-01 ENCOUNTER — Encounter: Payer: Self-pay | Admitting: Cardiology

## 2020-03-01 ENCOUNTER — Other Ambulatory Visit: Payer: Self-pay

## 2020-03-01 VITALS — BP 124/62 | HR 63 | Ht 72.0 in | Wt 258.0 lb

## 2020-03-01 DIAGNOSIS — I10 Essential (primary) hypertension: Secondary | ICD-10-CM

## 2020-03-01 DIAGNOSIS — E7800 Pure hypercholesterolemia, unspecified: Secondary | ICD-10-CM

## 2020-03-01 DIAGNOSIS — I2510 Atherosclerotic heart disease of native coronary artery without angina pectoris: Secondary | ICD-10-CM

## 2020-03-01 MED ORDER — CLOPIDOGREL BISULFATE 75 MG PO TABS: 75.0000 mg | ORAL_TABLET | Freq: Every day | ORAL | 3 refills | Status: DC

## 2020-03-01 NOTE — Patient Instructions (Signed)

## 2020-03-01 NOTE — Addendum Note (Signed)
Addended by: Antonieta Iba on: 03/01/2020 10:09 AM   Modules accepted: Orders

## 2020-03-13 DIAGNOSIS — M9904 Segmental and somatic dysfunction of sacral region: Secondary | ICD-10-CM | POA: Diagnosis not present

## 2020-03-13 DIAGNOSIS — M62830 Muscle spasm of back: Secondary | ICD-10-CM | POA: Diagnosis not present

## 2020-03-13 DIAGNOSIS — M9905 Segmental and somatic dysfunction of pelvic region: Secondary | ICD-10-CM | POA: Diagnosis not present

## 2020-03-13 DIAGNOSIS — M47817 Spondylosis without myelopathy or radiculopathy, lumbosacral region: Secondary | ICD-10-CM | POA: Diagnosis not present

## 2020-03-13 DIAGNOSIS — M5136 Other intervertebral disc degeneration, lumbar region: Secondary | ICD-10-CM | POA: Diagnosis not present

## 2020-03-13 DIAGNOSIS — M9903 Segmental and somatic dysfunction of lumbar region: Secondary | ICD-10-CM | POA: Diagnosis not present

## 2020-03-13 DIAGNOSIS — M545 Low back pain: Secondary | ICD-10-CM | POA: Diagnosis not present

## 2020-03-13 DIAGNOSIS — M5137 Other intervertebral disc degeneration, lumbosacral region: Secondary | ICD-10-CM | POA: Diagnosis not present

## 2020-03-14 ENCOUNTER — Other Ambulatory Visit: Payer: Self-pay | Admitting: Cardiology

## 2020-03-14 DIAGNOSIS — M5137 Other intervertebral disc degeneration, lumbosacral region: Secondary | ICD-10-CM | POA: Diagnosis not present

## 2020-03-14 DIAGNOSIS — M545 Low back pain: Secondary | ICD-10-CM | POA: Diagnosis not present

## 2020-03-14 DIAGNOSIS — M9905 Segmental and somatic dysfunction of pelvic region: Secondary | ICD-10-CM | POA: Diagnosis not present

## 2020-03-14 DIAGNOSIS — M62830 Muscle spasm of back: Secondary | ICD-10-CM | POA: Diagnosis not present

## 2020-03-14 DIAGNOSIS — M9903 Segmental and somatic dysfunction of lumbar region: Secondary | ICD-10-CM | POA: Diagnosis not present

## 2020-03-14 DIAGNOSIS — M5136 Other intervertebral disc degeneration, lumbar region: Secondary | ICD-10-CM | POA: Diagnosis not present

## 2020-03-14 DIAGNOSIS — M47817 Spondylosis without myelopathy or radiculopathy, lumbosacral region: Secondary | ICD-10-CM | POA: Diagnosis not present

## 2020-03-14 DIAGNOSIS — M9904 Segmental and somatic dysfunction of sacral region: Secondary | ICD-10-CM | POA: Diagnosis not present

## 2020-03-15 NOTE — Progress Notes (Signed)
Subjective:   Daniel Cruz is a 69 y.o. male who presents for Medicare Annual/Subsequent preventive examination.  Review of Systems:   Cardiac Risk Factors include: advanced age (>58mn, >>47women);dyslipidemia;hypertension;obesity (BMI >30kg/m2)     Objective:    Vitals: BP (!) 142/88 (BP Location: Left Arm, Patient Position: Sitting, Cuff Size: Normal)   Pulse 70   Temp 98 F (36.7 C)   Resp 12   Ht '5\' 11"'$  (1.803 m)   Wt 259 lb (117.5 kg)   SpO2 97%   BMI 36.12 kg/m   Body mass index is 36.12 kg/m.  Advanced Directives 03/16/2020 09/20/2019 03/15/2019 11/04/2018 08/27/2018 03/12/2018 07/22/2017  Does Patient Have a Medical Advance Directive? No;Yes Yes Yes Yes Yes Yes No  Type of AParamedicof [place]Living will - [place] will [place] will [place] RidgeLiving will [place]Living will -  Does patient want to make changes to medical advance directive? - No - Patient declined No - Patient declined - - - -  Copy of HHav[place]in Chart? No - copy requested - No - copy requested Yes - validated most recent copy scanned in chart (See row information) No - copy requested No - copy requested -  Would patient like information on creating a medical advance directive? No - Patient declined - - - - - No - Patient declined  Pre-existing out of facility DNR order (yellow form or pink MOST form) - - - - - - -    Tobacco Social History   Tobacco Use  Smoking Status Former Smoker  . Packs/day: 1.50  . Years: 16.00  . Pack years: 24.00  . Quit date: 07/08/2000  . Years since quitting: 19.7  Smokeless Tobacco Never Used  Tobacco Comment   2001     Counseling given: Not Answered Comment: 2001   Clinical Intake:  Pre-visit preparation completed: Yes  Pain : 0-10 Pain Score: 4  Pain Type: Chronic pain Pain Location: Back Pain Orientation:  Lower Pain Onset: More than a month ago Pain Frequency: Constant Pain Relieving Factors: Chiropractor  Pain Relieving Factors: Chiropractor  Nutritional Status: BMI > 30  Obese Diabetes: No  How often do you need to have someone help you when you read instructions, pamphlets, or other written materials from your doctor or pharmacy?: 1 - Never  Interpreter Needed?: No  Information entered by :: MCaroleen HammanLPN  Past Medical History:  Diagnosis Date  . Anemia, iron deficiency    hx  . CAD (coronary artery disease)     NSTEMI with PCI of the mid and distal RCA with 50-60% LAD 09/2000, s/p cath 01/2009 with patent RCA stent and 90% LAD s/p PCI of LAD and right PDA  . Cholelithiasis   . DIABETES MELLITUS, TYPE II 02/26/2007  . Fatty liver 02/2009   per u/s  . GERD (gastroesophageal reflux disease)   . HTN (hypertension)   . Myocardial infarction ([place]    2001  . OSA (obstructive sleep apnea)    can't tol. a CPAP  . Plantar fasciitis of left foot   . PVC (premature ventricular contraction) 06/06/2017  . Sleep apnea   . UTI (lower urinary tract infection)    in the 80s and 11/09   Past Surgical History:  Procedure Laterality Date  . CARDIAC CATHETERIZATION  10/2008   stents  . CHOLECYSTECTOMY N/A 07/16/2016   Procedure: LAPAROSCOPIC CHOLECYSTECTOMY WITH INTRAOPERATIVE  CHOLANGIOGRAM;  Surgeon: [name], MD;  Location: [facility];  Service: General;  Laterality: N/A;  . CORONARY STENT INTERVENTION N/A 09/20/2019   Procedure: CORONARY STENT INTERVENTION;  Surgeon: Burnell Blanks, MD;  Location: [facility];  Service: Cardiovascular;  Laterality: N/A;  . LEFT HEART CATH AND CORONARY ANGIOGRAPHY N/A 09/20/2019   Procedure: LEFT HEART CATH AND CORONARY ANGIOGRAPHY;  Surgeon: Burnell Blanks, MD;  Location: [facility];  Service: Cardiovascular;  Laterality: N/A;  . SEPTOPLASTY     w/ bilateral inferior turbinate reductions  . TONSILLECTOMY      Family History  Problem Relation Age of Onset  . Emphysema Mother        died  . COPD Mother   . Stroke Father   . Coronary artery disease Father   . Cancer Neg Hx        colon ,prostate  . Diabetes Neg Hx   . Colon cancer Neg Hx   . Esophageal cancer Neg Hx   . Rectal cancer Neg Hx   . Stomach cancer Neg Hx    Social History   Socioeconomic History  . Marital status: Married    Spouse name: Not on file  . Number of children: 2  . Years of education: Not on file  . Highest education level: Not on file  Occupational History  . 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 Smoking status: Former Smoker    Packs/day: 1.50    Years: 16.00    Pack years: 24.00    Quit date: 07/08/2000    Years since quitting: 19.7  . Smokeless tobacco: Never Used  . Tobacco comment: 2001  Vaping Use  . Vaping Use: Never used  Substance and Sexual Activity  . Alcohol use: Yes    Comment: socially  . Drug use: No  . Sexual activity: Yes  Other Topics Concern  . Not on file  Social History Narrative   Lives in [place], [place]   Lives w/ wife, 2 children, 2 g-kids   Retired April 2017          Social Determinants of Health   Financial Resource Strain: [place]   . Difficulty of Paying Living Expenses: Not hard at all  Food Insecurity: No Food Insecurity  . Worried About Charity fundraiser in the Last Year: Never true  . Ran Out of Food in the Last Year: Never true  Transportation Needs: No Transportation Needs  . Lack of Transportation (Medical): No  . Lack of Transportation (Non-Medical): No  Physical Activity: Insufficiently Active  . Days of Exercise per Week: 3 days  . Minutes of Exercise per Session: 30 min  Stress: No Stress Concern Present  . Feeling of Stress : Not at all  Social Connections: Moderately Isolated  . Frequency of Communication with Friends and Family: Three times a week  . Frequency of Social  Gatherings with Friends and Family: More than three times a week  . Attends Religious Services: Never  . Active Member of Clubs or Organizations: No  . Attends Archivist Meetings: Never  . Marital Status: Married    Outpatient Encounter Medications as of 03/16/2020  Medication Sig  . aspirin EC 81 MG tablet Take 81 mg by mouth at bedtime.   . blood glucose meter kit and supplies Dispense based on patient and insurance preference. Check blood sugar once daily  . carvedilol (  COREG) 6.25 MG tablet Take 1 tablet (6.25 mg total) by mouth 2 (two) times daily with a meal.  . cetirizine (ZYRTEC) 10 MG tablet Take 10 mg by mouth daily.  . clopidogrel (PLAVIX) 75 MG tablet TAKE 1 TABLET EVERY DAY WITH BREAKFAST  . dapagliflozin propanediol (FARXIGA) 10 MG TABS tablet Take 10 mg by mouth daily.  Marland Kitchen ezetimibe-simvastatin (VYTORIN) 10-10 MG tablet TAKE 1 TABLET DAILY  . glipiZIDE (GLUCOTROL XL) 10 MG 24 hr tablet Take 1 tablet (10 mg total) by mouth daily with breakfast.  . lisinopril (ZESTRIL) 20 MG tablet Take 1 tablet (20 mg total) by mouth daily.  . Multiple Vitamin (MULTIVITAMIN) tablet Take 1 tablet by mouth daily.    . nitroGLYCERIN (NITROSTAT) 0.4 MG SL tablet Place 1 tablet (0.4 mg total) under the tongue every 5 (five) minutes x 3 doses as needed for chest pain.  Glory Rosebush ULTRA test strip CHECK BLOOD SUGAR 1 DAILY  . pantoprazole (PROTONIX) 40 MG tablet Take 1 tablet (40 mg total) by mouth 2 (two) times daily before a meal.  . pioglitazone-metformin (ACTOPLUS MET) 15-850 MG tablet Take 1 tablet by mouth 2 (two) times daily with a meal.  . Potassium 99 MG TABS Take 99 mg by mouth at bedtime.  . TRADJENTA 5 MG TABS tablet TAKE 1 TABLET DAILY  . guaiFENesin (MUCINEX) 600 MG 12 hr tablet Take by mouth 2 (two) times daily. (Patient not taking: Reported on 03/16/2020)  . [DISCONTINUED] clopidogrel (PLAVIX) 75 MG tablet Take 1 tablet (75 mg total) by mouth daily with breakfast.   No  facility-administered encounter medications on file as of 03/16/2020.    Activities of Daily Living In your present state of health, do you have any difficulty performing the following activities: 03/16/2020  Hearing? N  Vision? N  Difficulty concentrating or making decisions? N  Walking or climbing stairs? N  Dressing or bathing? N  Doing errands, shopping? N  Preparing Food and eating ? N  Using the Toilet? N  In the past six months, have you accidently leaked urine? N  Do you have problems with loss of bowel control? N  Managing your Medications? N  Managing your Finances? N  Housekeeping or managing your Housekeeping? N  Some recent data might be hidden    Patient Care Team: Colon Branch, MD as PCP - General (Internal Medicine) Sueanne Margarita, MD as PCP - Cardiology (Cardiology) Marica Otter, [place] as Consulting Physician (Optometry) [name], MD as Consulting Physician (General Surgery)   Assessment:   This is a routine wellness examination for [place].  Exercise Activities and Dietary recommendations Current Exercise Habits: Home exercise routine, Type of exercise: walking, Time (Minutes): 30, Frequency (Times/Week): 3, Weekly Exercise (Minutes/Week): 90, Intensity: Mild, Exercise limited by: orthopedic condition(s)  Goals Addressed            This Visit's Progress   . Patient Stated       Continue exercise routine       Fall Risk: Fall Risk  03/16/2020 06/15/2019 03/15/2019 03/12/2018 03/11/2017  Falls in the past year? 0 0 0 No No  Number falls in past yr: 0 - - - -  Injury with Fall? 0 - - - -  Follow up - Falls evaluation completed - - -    FALL RISK PREVENTION PERTAINING TO THE HOME:  Any stairs in or around the home? Yes If so, are there any without handrails? Yes   Home free of loose  throw rugs in walkways, pet beds, electrical cords, etc? Yes  Adequate lighting in your home to reduce risk of falls? Yes   ASSISTIVE DEVICES UTILIZED TO PREVENT  FALLS:  Life alert? No  Use of a cane, walker or w/c? No  Grab bars in the bathroom? Yes  Shower chair or bench in shower? No  Elevated toilet seat or a handicapped toilet? No   TIMED UP AND GO:  Was the test performed? Yes .  Length of time to ambulate 10 feet: 10 sec.   GAIT:  Appearance of gait: Gait steady and fast without the use of an assistive device. Education: Fall risk prevention has been discussed.  Intervention(s) required? No  DME/home health order needed?  No   Depression Screen PHQ 2/9 Scores 03/16/2020 03/15/2019 12/11/2018 03/12/2018  PHQ - 2 Score 0 0 0 0  PHQ- 9 Score - - 2 -    Cognitive Function     6CIT Screen 03/16/2020  What Year? 0 points  What month? 0 points  What time? 0 points  Count back from 20 0 points  Months in reverse 0 points  Repeat phrase 0 points  Total Score 0    Immunization History  Administered Date(s) Administered  . Fluad Quad(high Dose 65+) 06/15/2019  . H1N1 11/08/2008  . Influenza Split 11/01/2011  . Influenza Whole 07/20/2007, 10/20/2009, 08/14/2010  . Influenza, High Dose Seasonal PF 09/15/2015, 11/08/2016, 08/10/2018  . Influenza,inj,Quad PF,6+ Mos 06/30/2013, 08/24/2014  . Influenza-Unspecified 07/14/2017  . PFIZER SARS-COV-2 Vaccination 11/12/2019, 12/07/2019  . Pneumococcal Conjugate-13 08/24/2014  . Pneumococcal Polysaccharide-23 07/20/2007, 03/11/2017  . Td 10/08/2003, 12/22/2015  . Zoster 11/30/2012  . Zoster Recombinat (Shingrix) 12/26/2018, 03/07/2019    Qualifies for Shingles Vaccine?  Zostavax completed 11/30/2012. Shingrix completed 2nd dose 03/07/2019  Tdap: Td completed 12/22/2015 Discuss need for  TDAP with PCP  Flu Vaccine: Due 06/2020  Pneumococcal Vaccine: Completed Prevnar 08/24/2014 Completed Pneumovax 23-03/11/2017   Covid-19 Vaccine: Completed vaccines 12/07/2019  Screening Tests Health Maintenance  Topic Date Due  . FOOT EXAM  12/11/2019  . INFLUENZA VACCINE  05/07/2020  . OPHTHALMOLOGY  EXAM  06/01/2020  . HEMOGLOBIN A1C  07/15/2020  . COLONOSCOPY  01/08/2023  . TETANUS/TDAP  12/21/2025  . COVID-19 Vaccine  Completed  . Hepatitis C Screening  Completed  . PNA vac Low Risk Adult  Completed   Cancer Screenings:  Colorectal Screening: Completed 01/07/2013. Repeat every 10 years.  Lung Cancer Screening: (Low Dose CT Chest recommended if Age 67-80 years, 30 pack-year currently smoking OR have quit w/in 15years.) does not qualify.    Additional Screening:  Hepatitis C Screening:Completed 11/08/2016  Vision Screening: Recommended annual ophthalmology exams for early detection of glaucoma and other disorders of the eye. Is the patient up to date with their annual eye exam?  Yes  Who is the provider or what is the name of the office in which the pt attends annual eye exams? Dr. Sabra Heck   Dental Screening: Recommended annual dental exams for proper oral hygiene  Community Resource Referral:  CRR required this visit?  No        Plan:  I have personally reviewed and addressed the Medicare Annual Wellness questionnaire and have noted the following in the patient's chart:  A. Medical and social history B. Use of alcohol, tobacco or illicit drugs  C. Current medications and supplements D. Functional ability and status E.  Nutritional status F.  Physical activity G. Advance directives H. List of other  physicians I.  Hospitalizations, surgeries, and ER visits in previous 12 months J.  Animas such as hearing and vision if needed, cognitive and depression L. Referrals and appointments   In addition, I have reviewed and discussed with patient certain preventive protocols, quality metrics, and best practice recommendations. A written personalized care plan for preventive services as well as general preventive health recommendations were provided to patient.   Signed,   Marta Antu, LPN  1/64/2903 Nurse Health Advisor   Nurse Notes: Patient states he  has a living will & Healthcare Power of Attorney & his lawyer has a copy. Copy requested for chart. He states he does not feel that we need to have a copy.

## 2020-03-16 ENCOUNTER — Other Ambulatory Visit: Payer: Self-pay

## 2020-03-16 ENCOUNTER — Ambulatory Visit (INDEPENDENT_AMBULATORY_CARE_PROVIDER_SITE_OTHER): Payer: Medicare HMO | Admitting: *Deleted

## 2020-03-16 ENCOUNTER — Encounter: Payer: Self-pay | Admitting: *Deleted

## 2020-03-16 VITALS — BP 142/88 | HR 70 | Temp 98.0°F | Resp 12 | Ht 71.0 in | Wt 259.0 lb

## 2020-03-16 DIAGNOSIS — M545 Low back pain: Secondary | ICD-10-CM | POA: Diagnosis not present

## 2020-03-16 DIAGNOSIS — M5137 Other intervertebral disc degeneration, lumbosacral region: Secondary | ICD-10-CM | POA: Diagnosis not present

## 2020-03-16 DIAGNOSIS — M47817 Spondylosis without myelopathy or radiculopathy, lumbosacral region: Secondary | ICD-10-CM | POA: Diagnosis not present

## 2020-03-16 DIAGNOSIS — M62830 Muscle spasm of back: Secondary | ICD-10-CM | POA: Diagnosis not present

## 2020-03-16 DIAGNOSIS — Z0000 Encounter for general adult medical examination without abnormal findings: Secondary | ICD-10-CM | POA: Diagnosis not present

## 2020-03-16 DIAGNOSIS — M9905 Segmental and somatic dysfunction of pelvic region: Secondary | ICD-10-CM | POA: Diagnosis not present

## 2020-03-16 DIAGNOSIS — M9904 Segmental and somatic dysfunction of sacral region: Secondary | ICD-10-CM | POA: Diagnosis not present

## 2020-03-16 DIAGNOSIS — M5136 Other intervertebral disc degeneration, lumbar region: Secondary | ICD-10-CM | POA: Diagnosis not present

## 2020-03-16 DIAGNOSIS — M9903 Segmental and somatic dysfunction of lumbar region: Secondary | ICD-10-CM | POA: Diagnosis not present

## 2020-03-16 NOTE — Patient Instructions (Signed)
Daniel Cruz , Thank you for taking time to come for your Medicare Wellness Visit. I appreciate your ongoing commitment to your health goals. Please review the following plan we discussed and let me know if I can assist you in the future.   Screening recommendations/referrals: Colonoscopy: Completed 01/07/2013-Due 01/08/2023 Recommended yearly ophthalmology/optometry visit for glaucoma screening and checkup Recommended yearly dental visit for hygiene and checkup  Vaccinations: Influenza vaccine: Due 06/2020 Pneumococcal vaccine: Completed 03/11/2017 Tdap vaccine: Up to date-12/22/2015 Shingles vaccine: Completed 03/07/2019 Covid-19: Completed 12/07/19  Advanced directives: Completed-Lawyer has copy  Conditions/risks identified: See problem list  Next appointment: Follow up in one year for your annual wellness visit. 03/22/21 @ 9:30  Preventive Care 65 Years and Older, Male Preventive care refers to lifestyle choices and visits with your health care provider that can promote health and wellness. What does preventive care include?  A yearly physical exam. This is also called an annual well check.  Dental exams once or twice a year.  Routine eye exams. Ask your health care provider how often you should have your eyes checked.  Personal lifestyle choices, including:  Daily care of your teeth and gums.  Regular physical activity.  Eating a healthy diet.  Avoiding tobacco and drug use.  Limiting alcohol use.  Practicing safe sex.  Taking low doses of aspirin every day.  Taking vitamin and mineral supplements as recommended by your health care provider. What happens during an annual well check? The services and screenings done by your health care provider during your annual well check will depend on your age, overall health, lifestyle risk factors, and family history of disease. Counseling  Your health care provider may ask you questions about your:  Alcohol use.  Tobacco  use.  Drug use.  Emotional well-being.  Home and relationship well-being.  Sexual activity.  Eating habits.  History of falls.  Memory and ability to understand (cognition).  Work and work Statistician. Screening  You may have the following tests or measurements:  Height, weight, and BMI.  Blood pressure.  Lipid and cholesterol levels. These may be checked every 5 years, or more frequently if you are over 78 years old.  Skin check.  Lung cancer screening. You may have this screening every year starting at age 62 if you have a 30-pack-year history of smoking and currently smoke or have quit within the past 15 years.  Fecal occult blood test (FOBT) of the stool. You may have this test every year starting at age 65.  Flexible sigmoidoscopy or colonoscopy. You may have a sigmoidoscopy every 5 years or a colonoscopy every 10 years starting at age 41.  Prostate cancer screening. Recommendations will vary depending on your family history and other risks.  Hepatitis C blood test.  Hepatitis B blood test.  Sexually transmitted disease (STD) testing.  Diabetes screening. This is done by checking your blood sugar (glucose) after you have not eaten for a while (fasting). You may have this done every 1-3 years.  Abdominal aortic aneurysm (AAA) screening. You may need this if you are a current or former smoker.  Osteoporosis. You may be screened starting at age 42 if you are at high risk. Talk with your health care provider about your test results, treatment options, and if necessary, the need for more tests. Vaccines  Your health care provider may recommend certain vaccines, such as:  Influenza vaccine. This is recommended every year.  Tetanus, diphtheria, and acellular pertussis (Tdap, Td) vaccine. You may need  a Td booster every 10 years.  Zoster vaccine. You may need this after age 79.  Pneumococcal 13-valent conjugate (PCV13) vaccine. One dose is recommended after age  37.  Pneumococcal polysaccharide (PPSV23) vaccine. One dose is recommended after age 77. Talk to your health care provider about which screenings and vaccines you need and how often you need them. This information is not intended to replace advice given to you by your health care provider. Make sure you discuss any questions you have with your health care provider. Document Released: 10/20/2015 Document Revised: 06/12/2016 Document Reviewed: 07/25/2015 Elsevier Interactive Patient Education  2017 [place] Prevention in the Home Falls can cause injuries. They can happen to people of all ages. There are many things you can do to make your home safe and to help prevent falls. What can I do on the outside of my home?  Regularly fix the edges of walkways and driveways and fix any cracks.  Remove anything that might make you trip as you walk through a door, such as a raised step or threshold.  Trim any bushes or trees on the path to your home.  Use bright outdoor lighting.  Clear any walking paths of anything that might make someone trip, such as rocks or tools.  Regularly check to see if handrails are loose or broken. Make sure that both sides of any steps have handrails.  Any raised decks and porches should have guardrails on the edges.  Have any leaves, snow, or ice cleared regularly.  Use sand or salt on walking paths during winter.  Clean up any spills in your garage right away. This includes oil or grease spills. What can I do in the bathroom?  Use night lights.  Install grab bars by the toilet and in the tub and shower. Do not use towel bars as grab bars.  Use non-skid mats or decals in the tub or shower.  If you need to sit down in the shower, use a plastic, non-slip stool.  Keep the floor dry. Clean up any water that spills on the floor as soon as it happens.  Remove soap buildup in the tub or shower regularly.  Attach bath mats securely with double-sided  non-slip rug tape.  Do not have throw rugs and other things on the floor that can make you trip. What can I do in the bedroom?  Use night lights.  Make sure that you have a light by your bed that is easy to reach.  Do not use any sheets or blankets that are too big for your bed. They should not hang down onto the floor.  Have a firm chair that has side arms. You can use this for support while you get dressed.  Do not have throw rugs and other things on the floor that can make you trip. What can I do in the kitchen?  Clean up any spills right away.  Avoid walking on wet floors.  Keep items that you use a lot in easy-to-reach places.  If you need to reach something above you, use a strong step stool that has a grab bar.  Keep electrical cords out of the way.  Do not use floor polish or wax that makes floors slippery. If you must use wax, use non-skid floor wax.  Do not have throw rugs and other things on the floor that can make you trip. What can I do with my stairs?  Do not leave any items on the  stairs.  Make sure that there are handrails on both sides of the stairs and use them. Fix handrails that are broken or loose. Make sure that handrails are as long as the stairways.  Check any carpeting to make sure that it is firmly attached to the stairs. Fix any carpet that is loose or worn.  Avoid having throw rugs at the top or bottom of the stairs. If you do have throw rugs, attach them to the floor with carpet tape.  Make sure that you have a light switch at the top of the stairs and the bottom of the stairs. If you do not have them, ask someone to add them for you. What else can I do to help prevent falls?  Wear shoes that:  Do not have high heels.  Have rubber bottoms.  Are comfortable and fit you well.  Are closed at the toe. Do not wear sandals.  If you use a stepladder:  Make sure that it is fully opened. Do not climb a closed stepladder.  Make sure that both  sides of the stepladder are locked into place.  Ask someone to hold it for you, if possible.  Clearly mark and make sure that you can see:  Any grab bars or handrails.  First and last steps.  Where the edge of each step is.  Use tools that help you move around (mobility aids) if they are needed. These include:  Canes.  Walkers.  Scooters.  Crutches.  Turn on the lights when you go into a dark area. Replace any light bulbs as soon as they burn out.  Set up your furniture so you have a clear path. Avoid moving your furniture around.  If any of your floors are uneven, fix them.  If there are any pets around you, be aware of where they are.  Review your medicines with your doctor. Some medicines can make you feel dizzy. This can increase your chance of falling. Ask your doctor what other things that you can do to help prevent falls. This information is not intended to replace advice given to you by your health care provider. Make sure you discuss any questions you have with your health care provider. Document Released: 07/20/2009 Document Revised: 02/29/2016 Document Reviewed: 10/28/2014 Elsevier Interactive Patient Education  2017 [employer].

## 2020-03-20 DIAGNOSIS — M9903 Segmental and somatic dysfunction of lumbar region: Secondary | ICD-10-CM | POA: Diagnosis not present

## 2020-03-20 DIAGNOSIS — M9905 Segmental and somatic dysfunction of pelvic region: Secondary | ICD-10-CM | POA: Diagnosis not present

## 2020-03-20 DIAGNOSIS — M5137 Other intervertebral disc degeneration, lumbosacral region: Secondary | ICD-10-CM | POA: Diagnosis not present

## 2020-03-20 DIAGNOSIS — M62830 Muscle spasm of back: Secondary | ICD-10-CM | POA: Diagnosis not present

## 2020-03-20 DIAGNOSIS — M9904 Segmental and somatic dysfunction of sacral region: Secondary | ICD-10-CM | POA: Diagnosis not present

## 2020-03-20 DIAGNOSIS — M5136 Other intervertebral disc degeneration, lumbar region: Secondary | ICD-10-CM | POA: Diagnosis not present

## 2020-03-20 DIAGNOSIS — M47817 Spondylosis without myelopathy or radiculopathy, lumbosacral region: Secondary | ICD-10-CM | POA: Diagnosis not present

## 2020-03-20 DIAGNOSIS — M545 Low back pain: Secondary | ICD-10-CM | POA: Diagnosis not present

## 2020-03-21 DIAGNOSIS — M47817 Spondylosis without myelopathy or radiculopathy, lumbosacral region: Secondary | ICD-10-CM | POA: Diagnosis not present

## 2020-03-21 DIAGNOSIS — M5136 Other intervertebral disc degeneration, lumbar region: Secondary | ICD-10-CM | POA: Diagnosis not present

## 2020-03-21 DIAGNOSIS — M9904 Segmental and somatic dysfunction of sacral region: Secondary | ICD-10-CM | POA: Diagnosis not present

## 2020-03-21 DIAGNOSIS — M9905 Segmental and somatic dysfunction of pelvic region: Secondary | ICD-10-CM | POA: Diagnosis not present

## 2020-03-21 DIAGNOSIS — M62830 Muscle spasm of back: Secondary | ICD-10-CM | POA: Diagnosis not present

## 2020-03-21 DIAGNOSIS — M545 Low back pain: Secondary | ICD-10-CM | POA: Diagnosis not present

## 2020-03-21 DIAGNOSIS — M9903 Segmental and somatic dysfunction of lumbar region: Secondary | ICD-10-CM | POA: Diagnosis not present

## 2020-03-21 DIAGNOSIS — M5137 Other intervertebral disc degeneration, lumbosacral region: Secondary | ICD-10-CM | POA: Diagnosis not present

## 2020-03-23 DIAGNOSIS — M9905 Segmental and somatic dysfunction of pelvic region: Secondary | ICD-10-CM | POA: Diagnosis not present

## 2020-03-23 DIAGNOSIS — M9903 Segmental and somatic dysfunction of lumbar region: Secondary | ICD-10-CM | POA: Diagnosis not present

## 2020-03-23 DIAGNOSIS — M47817 Spondylosis without myelopathy or radiculopathy, lumbosacral region: Secondary | ICD-10-CM | POA: Diagnosis not present

## 2020-03-23 DIAGNOSIS — M62830 Muscle spasm of back: Secondary | ICD-10-CM | POA: Diagnosis not present

## 2020-03-23 DIAGNOSIS — M545 Low back pain: Secondary | ICD-10-CM | POA: Diagnosis not present

## 2020-03-23 DIAGNOSIS — M5137 Other intervertebral disc degeneration, lumbosacral region: Secondary | ICD-10-CM | POA: Diagnosis not present

## 2020-03-23 DIAGNOSIS — M5136 Other intervertebral disc degeneration, lumbar region: Secondary | ICD-10-CM | POA: Diagnosis not present

## 2020-03-23 DIAGNOSIS — M9904 Segmental and somatic dysfunction of sacral region: Secondary | ICD-10-CM | POA: Diagnosis not present

## 2020-03-27 DIAGNOSIS — M5137 Other intervertebral disc degeneration, lumbosacral region: Secondary | ICD-10-CM | POA: Diagnosis not present

## 2020-03-27 DIAGNOSIS — M47817 Spondylosis without myelopathy or radiculopathy, lumbosacral region: Secondary | ICD-10-CM | POA: Diagnosis not present

## 2020-03-27 DIAGNOSIS — M62830 Muscle spasm of back: Secondary | ICD-10-CM | POA: Diagnosis not present

## 2020-03-27 DIAGNOSIS — M9903 Segmental and somatic dysfunction of lumbar region: Secondary | ICD-10-CM | POA: Diagnosis not present

## 2020-03-27 DIAGNOSIS — M545 Low back pain: Secondary | ICD-10-CM | POA: Diagnosis not present

## 2020-03-27 DIAGNOSIS — M5136 Other intervertebral disc degeneration, lumbar region: Secondary | ICD-10-CM | POA: Diagnosis not present

## 2020-03-27 DIAGNOSIS — M9904 Segmental and somatic dysfunction of sacral region: Secondary | ICD-10-CM | POA: Diagnosis not present

## 2020-03-27 DIAGNOSIS — M9905 Segmental and somatic dysfunction of pelvic region: Secondary | ICD-10-CM | POA: Diagnosis not present

## 2020-03-28 DIAGNOSIS — M9905 Segmental and somatic dysfunction of pelvic region: Secondary | ICD-10-CM | POA: Diagnosis not present

## 2020-03-28 DIAGNOSIS — M47817 Spondylosis without myelopathy or radiculopathy, lumbosacral region: Secondary | ICD-10-CM | POA: Diagnosis not present

## 2020-03-28 DIAGNOSIS — M545 Low back pain: Secondary | ICD-10-CM | POA: Diagnosis not present

## 2020-03-28 DIAGNOSIS — M5136 Other intervertebral disc degeneration, lumbar region: Secondary | ICD-10-CM | POA: Diagnosis not present

## 2020-03-28 DIAGNOSIS — M9904 Segmental and somatic dysfunction of sacral region: Secondary | ICD-10-CM | POA: Diagnosis not present

## 2020-03-28 DIAGNOSIS — M9903 Segmental and somatic dysfunction of lumbar region: Secondary | ICD-10-CM | POA: Diagnosis not present

## 2020-03-28 DIAGNOSIS — M5137 Other intervertebral disc degeneration, lumbosacral region: Secondary | ICD-10-CM | POA: Diagnosis not present

## 2020-03-28 DIAGNOSIS — M62830 Muscle spasm of back: Secondary | ICD-10-CM | POA: Diagnosis not present

## 2020-03-30 ENCOUNTER — Other Ambulatory Visit: Payer: Self-pay | Admitting: Internal Medicine

## 2020-03-30 DIAGNOSIS — M5137 Other intervertebral disc degeneration, lumbosacral region: Secondary | ICD-10-CM | POA: Diagnosis not present

## 2020-03-30 DIAGNOSIS — M9905 Segmental and somatic dysfunction of pelvic region: Secondary | ICD-10-CM | POA: Diagnosis not present

## 2020-03-30 DIAGNOSIS — M5136 Other intervertebral disc degeneration, lumbar region: Secondary | ICD-10-CM | POA: Diagnosis not present

## 2020-03-30 DIAGNOSIS — M9904 Segmental and somatic dysfunction of sacral region: Secondary | ICD-10-CM | POA: Diagnosis not present

## 2020-03-30 DIAGNOSIS — M62830 Muscle spasm of back: Secondary | ICD-10-CM | POA: Diagnosis not present

## 2020-03-30 DIAGNOSIS — M47817 Spondylosis without myelopathy or radiculopathy, lumbosacral region: Secondary | ICD-10-CM | POA: Diagnosis not present

## 2020-03-30 DIAGNOSIS — M9903 Segmental and somatic dysfunction of lumbar region: Secondary | ICD-10-CM | POA: Diagnosis not present

## 2020-03-30 DIAGNOSIS — M545 Low back pain: Secondary | ICD-10-CM | POA: Diagnosis not present

## 2020-04-03 DIAGNOSIS — M9905 Segmental and somatic dysfunction of pelvic region: Secondary | ICD-10-CM | POA: Diagnosis not present

## 2020-04-03 DIAGNOSIS — M9904 Segmental and somatic dysfunction of sacral region: Secondary | ICD-10-CM | POA: Diagnosis not present

## 2020-04-03 DIAGNOSIS — M62830 Muscle spasm of back: Secondary | ICD-10-CM | POA: Diagnosis not present

## 2020-04-03 DIAGNOSIS — M5137 Other intervertebral disc degeneration, lumbosacral region: Secondary | ICD-10-CM | POA: Diagnosis not present

## 2020-04-03 DIAGNOSIS — M5136 Other intervertebral disc degeneration, lumbar region: Secondary | ICD-10-CM | POA: Diagnosis not present

## 2020-04-03 DIAGNOSIS — M545 Low back pain: Secondary | ICD-10-CM | POA: Diagnosis not present

## 2020-04-03 DIAGNOSIS — M47817 Spondylosis without myelopathy or radiculopathy, lumbosacral region: Secondary | ICD-10-CM | POA: Diagnosis not present

## 2020-04-03 DIAGNOSIS — M9903 Segmental and somatic dysfunction of lumbar region: Secondary | ICD-10-CM | POA: Diagnosis not present

## 2020-04-07 ENCOUNTER — Encounter: Payer: Self-pay | Admitting: Internal Medicine

## 2020-04-12 DIAGNOSIS — M9904 Segmental and somatic dysfunction of sacral region: Secondary | ICD-10-CM | POA: Diagnosis not present

## 2020-04-12 DIAGNOSIS — M9903 Segmental and somatic dysfunction of lumbar region: Secondary | ICD-10-CM | POA: Diagnosis not present

## 2020-04-12 DIAGNOSIS — M47817 Spondylosis without myelopathy or radiculopathy, lumbosacral region: Secondary | ICD-10-CM | POA: Diagnosis not present

## 2020-04-12 DIAGNOSIS — M5136 Other intervertebral disc degeneration, lumbar region: Secondary | ICD-10-CM | POA: Diagnosis not present

## 2020-04-12 DIAGNOSIS — M5137 Other intervertebral disc degeneration, lumbosacral region: Secondary | ICD-10-CM | POA: Diagnosis not present

## 2020-04-12 DIAGNOSIS — M545 Low back pain: Secondary | ICD-10-CM | POA: Diagnosis not present

## 2020-04-12 DIAGNOSIS — M62830 Muscle spasm of back: Secondary | ICD-10-CM | POA: Diagnosis not present

## 2020-04-12 DIAGNOSIS — M9905 Segmental and somatic dysfunction of pelvic region: Secondary | ICD-10-CM | POA: Diagnosis not present

## 2020-04-13 DIAGNOSIS — M9904 Segmental and somatic dysfunction of sacral region: Secondary | ICD-10-CM | POA: Diagnosis not present

## 2020-04-13 DIAGNOSIS — M5137 Other intervertebral disc degeneration, lumbosacral region: Secondary | ICD-10-CM | POA: Diagnosis not present

## 2020-04-13 DIAGNOSIS — M62830 Muscle spasm of back: Secondary | ICD-10-CM | POA: Diagnosis not present

## 2020-04-13 DIAGNOSIS — M9905 Segmental and somatic dysfunction of pelvic region: Secondary | ICD-10-CM | POA: Diagnosis not present

## 2020-04-13 DIAGNOSIS — M47817 Spondylosis without myelopathy or radiculopathy, lumbosacral region: Secondary | ICD-10-CM | POA: Diagnosis not present

## 2020-04-13 DIAGNOSIS — M9903 Segmental and somatic dysfunction of lumbar region: Secondary | ICD-10-CM | POA: Diagnosis not present

## 2020-04-13 DIAGNOSIS — M5136 Other intervertebral disc degeneration, lumbar region: Secondary | ICD-10-CM | POA: Diagnosis not present

## 2020-04-13 DIAGNOSIS — M545 Low back pain: Secondary | ICD-10-CM | POA: Diagnosis not present

## 2020-04-18 DIAGNOSIS — M9905 Segmental and somatic dysfunction of pelvic region: Secondary | ICD-10-CM | POA: Diagnosis not present

## 2020-04-18 DIAGNOSIS — M545 Low back pain: Secondary | ICD-10-CM | POA: Diagnosis not present

## 2020-04-18 DIAGNOSIS — M5136 Other intervertebral disc degeneration, lumbar region: Secondary | ICD-10-CM | POA: Diagnosis not present

## 2020-04-18 DIAGNOSIS — M5137 Other intervertebral disc degeneration, lumbosacral region: Secondary | ICD-10-CM | POA: Diagnosis not present

## 2020-04-18 DIAGNOSIS — M9904 Segmental and somatic dysfunction of sacral region: Secondary | ICD-10-CM | POA: Diagnosis not present

## 2020-04-18 DIAGNOSIS — M62830 Muscle spasm of back: Secondary | ICD-10-CM | POA: Diagnosis not present

## 2020-04-18 DIAGNOSIS — M9903 Segmental and somatic dysfunction of lumbar region: Secondary | ICD-10-CM | POA: Diagnosis not present

## 2020-04-18 DIAGNOSIS — M47817 Spondylosis without myelopathy or radiculopathy, lumbosacral region: Secondary | ICD-10-CM | POA: Diagnosis not present

## 2020-04-20 DIAGNOSIS — M62830 Muscle spasm of back: Secondary | ICD-10-CM | POA: Diagnosis not present

## 2020-04-20 DIAGNOSIS — M5136 Other intervertebral disc degeneration, lumbar region: Secondary | ICD-10-CM | POA: Diagnosis not present

## 2020-04-20 DIAGNOSIS — M9903 Segmental and somatic dysfunction of lumbar region: Secondary | ICD-10-CM | POA: Diagnosis not present

## 2020-04-20 DIAGNOSIS — M5137 Other intervertebral disc degeneration, lumbosacral region: Secondary | ICD-10-CM | POA: Diagnosis not present

## 2020-04-20 DIAGNOSIS — M47817 Spondylosis without myelopathy or radiculopathy, lumbosacral region: Secondary | ICD-10-CM | POA: Diagnosis not present

## 2020-04-20 DIAGNOSIS — M9905 Segmental and somatic dysfunction of pelvic region: Secondary | ICD-10-CM | POA: Diagnosis not present

## 2020-04-20 DIAGNOSIS — M9904 Segmental and somatic dysfunction of sacral region: Secondary | ICD-10-CM | POA: Diagnosis not present

## 2020-04-20 DIAGNOSIS — M545 Low back pain: Secondary | ICD-10-CM | POA: Diagnosis not present

## 2020-04-24 DIAGNOSIS — M9905 Segmental and somatic dysfunction of pelvic region: Secondary | ICD-10-CM | POA: Diagnosis not present

## 2020-04-24 DIAGNOSIS — M545 Low back pain: Secondary | ICD-10-CM | POA: Diagnosis not present

## 2020-04-24 DIAGNOSIS — M9904 Segmental and somatic dysfunction of sacral region: Secondary | ICD-10-CM | POA: Diagnosis not present

## 2020-04-24 DIAGNOSIS — M62830 Muscle spasm of back: Secondary | ICD-10-CM | POA: Diagnosis not present

## 2020-04-24 DIAGNOSIS — M47817 Spondylosis without myelopathy or radiculopathy, lumbosacral region: Secondary | ICD-10-CM | POA: Diagnosis not present

## 2020-04-24 DIAGNOSIS — M5137 Other intervertebral disc degeneration, lumbosacral region: Secondary | ICD-10-CM | POA: Diagnosis not present

## 2020-04-24 DIAGNOSIS — M5136 Other intervertebral disc degeneration, lumbar region: Secondary | ICD-10-CM | POA: Diagnosis not present

## 2020-04-24 DIAGNOSIS — M9903 Segmental and somatic dysfunction of lumbar region: Secondary | ICD-10-CM | POA: Diagnosis not present

## 2020-05-01 DIAGNOSIS — M9903 Segmental and somatic dysfunction of lumbar region: Secondary | ICD-10-CM | POA: Diagnosis not present

## 2020-05-01 DIAGNOSIS — M9905 Segmental and somatic dysfunction of pelvic region: Secondary | ICD-10-CM | POA: Diagnosis not present

## 2020-05-01 DIAGNOSIS — M5137 Other intervertebral disc degeneration, lumbosacral region: Secondary | ICD-10-CM | POA: Diagnosis not present

## 2020-05-01 DIAGNOSIS — M62830 Muscle spasm of back: Secondary | ICD-10-CM | POA: Diagnosis not present

## 2020-05-01 DIAGNOSIS — M545 Low back pain: Secondary | ICD-10-CM | POA: Diagnosis not present

## 2020-05-01 DIAGNOSIS — M47817 Spondylosis without myelopathy or radiculopathy, lumbosacral region: Secondary | ICD-10-CM | POA: Diagnosis not present

## 2020-05-01 DIAGNOSIS — M9904 Segmental and somatic dysfunction of sacral region: Secondary | ICD-10-CM | POA: Diagnosis not present

## 2020-05-01 DIAGNOSIS — M5136 Other intervertebral disc degeneration, lumbar region: Secondary | ICD-10-CM | POA: Diagnosis not present

## 2020-05-04 ENCOUNTER — Encounter: Payer: Self-pay | Admitting: Internal Medicine

## 2020-05-04 DIAGNOSIS — M62830 Muscle spasm of back: Secondary | ICD-10-CM | POA: Diagnosis not present

## 2020-05-04 DIAGNOSIS — M5136 Other intervertebral disc degeneration, lumbar region: Secondary | ICD-10-CM | POA: Diagnosis not present

## 2020-05-04 DIAGNOSIS — M9904 Segmental and somatic dysfunction of sacral region: Secondary | ICD-10-CM | POA: Diagnosis not present

## 2020-05-04 DIAGNOSIS — M545 Low back pain: Secondary | ICD-10-CM | POA: Diagnosis not present

## 2020-05-04 DIAGNOSIS — M5137 Other intervertebral disc degeneration, lumbosacral region: Secondary | ICD-10-CM | POA: Diagnosis not present

## 2020-05-04 DIAGNOSIS — M47817 Spondylosis without myelopathy or radiculopathy, lumbosacral region: Secondary | ICD-10-CM | POA: Diagnosis not present

## 2020-05-04 DIAGNOSIS — M9905 Segmental and somatic dysfunction of pelvic region: Secondary | ICD-10-CM | POA: Diagnosis not present

## 2020-05-04 DIAGNOSIS — M9903 Segmental and somatic dysfunction of lumbar region: Secondary | ICD-10-CM | POA: Diagnosis not present

## 2020-05-05 ENCOUNTER — Other Ambulatory Visit: Payer: Self-pay | Admitting: Internal Medicine

## 2020-05-05 MED ORDER — LINAGLIPTIN 5 MG PO TABS: 5.0000 mg | ORAL_TABLET | Freq: Every day | ORAL | 0 refills | Status: DC

## 2020-05-05 MED ORDER — STEGLATRO 15 MG PO TABS: 15.0000 mg | ORAL_TABLET | Freq: Every day | ORAL | 0 refills | Status: DC

## 2020-05-11 ENCOUNTER — Encounter: Payer: Self-pay | Admitting: Internal Medicine

## 2020-05-12 ENCOUNTER — Telehealth: Payer: Self-pay

## 2020-05-12 ENCOUNTER — Ambulatory Visit: Payer: Medicare HMO | Admitting: Nurse Practitioner

## 2020-05-12 NOTE — Telephone Encounter (Signed)
PA approved. Effective 10/08/2019 to 10/06/2020. 

## 2020-05-12 NOTE — Telephone Encounter (Signed)
Received PA form from [facility]. Form completed and faxed back to [phone]. Awaiting determination.

## 2020-05-16 MED ORDER — EZETIMIBE-SIMVASTATIN 10-10 MG PO TABS: 1.0000 | ORAL_TABLET | Freq: Every day | ORAL | 1 refills | Status: DC

## 2020-05-16 MED ORDER — STEGLATRO 15 MG PO TABS: 15.0000 mg | ORAL_TABLET | Freq: Every day | ORAL | 1 refills | Status: DC

## 2020-05-18 DIAGNOSIS — M62830 Muscle spasm of back: Secondary | ICD-10-CM | POA: Diagnosis not present

## 2020-05-18 DIAGNOSIS — M545 Low back pain: Secondary | ICD-10-CM | POA: Diagnosis not present

## 2020-05-18 DIAGNOSIS — M5137 Other intervertebral disc degeneration, lumbosacral region: Secondary | ICD-10-CM | POA: Diagnosis not present

## 2020-05-18 DIAGNOSIS — M5136 Other intervertebral disc degeneration, lumbar region: Secondary | ICD-10-CM | POA: Diagnosis not present

## 2020-05-18 DIAGNOSIS — M9905 Segmental and somatic dysfunction of pelvic region: Secondary | ICD-10-CM | POA: Diagnosis not present

## 2020-05-18 DIAGNOSIS — M47817 Spondylosis without myelopathy or radiculopathy, lumbosacral region: Secondary | ICD-10-CM | POA: Diagnosis not present

## 2020-05-18 DIAGNOSIS — M9903 Segmental and somatic dysfunction of lumbar region: Secondary | ICD-10-CM | POA: Diagnosis not present

## 2020-05-18 DIAGNOSIS — M9904 Segmental and somatic dysfunction of sacral region: Secondary | ICD-10-CM | POA: Diagnosis not present

## 2020-06-01 DIAGNOSIS — Z0100 Encounter for examination of eyes and vision without abnormal findings: Secondary | ICD-10-CM | POA: Diagnosis not present

## 2020-06-15 DIAGNOSIS — M9903 Segmental and somatic dysfunction of lumbar region: Secondary | ICD-10-CM | POA: Diagnosis not present

## 2020-06-15 DIAGNOSIS — M9904 Segmental and somatic dysfunction of sacral region: Secondary | ICD-10-CM | POA: Diagnosis not present

## 2020-06-15 DIAGNOSIS — M9905 Segmental and somatic dysfunction of pelvic region: Secondary | ICD-10-CM | POA: Diagnosis not present

## 2020-06-15 DIAGNOSIS — M5136 Other intervertebral disc degeneration, lumbar region: Secondary | ICD-10-CM | POA: Diagnosis not present

## 2020-06-15 DIAGNOSIS — M545 Low back pain: Secondary | ICD-10-CM | POA: Diagnosis not present

## 2020-06-15 DIAGNOSIS — M5137 Other intervertebral disc degeneration, lumbosacral region: Secondary | ICD-10-CM | POA: Diagnosis not present

## 2020-06-15 DIAGNOSIS — M62830 Muscle spasm of back: Secondary | ICD-10-CM | POA: Diagnosis not present

## 2020-06-15 DIAGNOSIS — M47817 Spondylosis without myelopathy or radiculopathy, lumbosacral region: Secondary | ICD-10-CM | POA: Diagnosis not present

## 2020-06-20 ENCOUNTER — Ambulatory Visit (HOSPITAL_BASED_OUTPATIENT_CLINIC_OR_DEPARTMENT_OTHER): Admission: RE | Admit: 2020-06-20 | Payer: Medicare HMO | Source: Ambulatory Visit

## 2020-06-20 ENCOUNTER — Other Ambulatory Visit: Payer: Self-pay

## 2020-06-20 ENCOUNTER — Encounter: Payer: Self-pay | Admitting: Internal Medicine

## 2020-06-20 ENCOUNTER — Ambulatory Visit (INDEPENDENT_AMBULATORY_CARE_PROVIDER_SITE_OTHER): Payer: Medicare HMO | Admitting: Internal Medicine

## 2020-06-20 VITALS — BP 117/77 | HR 60 | Temp 98.0°F | Resp 16 | Ht 69.75 in | Wt 253.4 lb

## 2020-06-20 DIAGNOSIS — E118 Type 2 diabetes mellitus with unspecified complications: Secondary | ICD-10-CM

## 2020-06-20 DIAGNOSIS — D649 Anemia, unspecified: Secondary | ICD-10-CM | POA: Diagnosis not present

## 2020-06-20 DIAGNOSIS — R29890 Loss of height: Secondary | ICD-10-CM | POA: Diagnosis not present

## 2020-06-20 DIAGNOSIS — Z0000 Encounter for general adult medical examination without abnormal findings: Secondary | ICD-10-CM | POA: Diagnosis not present

## 2020-06-20 NOTE — Progress Notes (Signed)
Subjective:    Patient ID: Daniel Cruz, male    DOB: 11-10-1950, 69 y.o.   MRN: [id number]  DOS:  06/20/2020 Type of visit - description: Here for CPX In general feeling well. No concerns  Wt Readings from Last 3 Encounters:  06/20/20 253 lb 6 oz (114.9 kg)  03/16/20 259 lb (117.5 kg)  03/01/20 258 lb (117 kg)     Review of Systems   A 14 point review of systems is negative    Past Medical History:  Diagnosis Date  . Anemia, iron deficiency    hx  . CAD (coronary artery disease)     NSTEMI with PCI of the mid and distal RCA with 50-60% LAD 09/2000, s/p cath 01/2009 with patent RCA stent and 90% LAD s/p PCI of LAD and right PDA  . Cholelithiasis   . DIABETES MELLITUS, TYPE II 02/26/2007  . Fatty liver 02/2009   per u/s  . GERD (gastroesophageal reflux disease)   . HTN (hypertension)   . Myocardial infarction ([place])    2001  . OSA (obstructive sleep apnea)    can't tol. a CPAP  . Plantar fasciitis of left foot   . PVC (premature ventricular contraction) 06/06/2017  . Sleep apnea   . UTI (lower urinary tract infection)    in the 80s and 11/09    Past Surgical History:  Procedure Laterality Date  . CARDIAC CATHETERIZATION  10/2008   stents  . CHOLECYSTECTOMY N/A 07/16/2016   Procedure: LAPAROSCOPIC CHOLECYSTECTOMY WITH INTRAOPERATIVE CHOLANGIOGRAM;  Surgeon: [name], MD;  Location: [facility];  Service: General;  Laterality: N/A;  . CORONARY STENT INTERVENTION N/A 09/20/2019   Procedure: CORONARY STENT INTERVENTION;  Surgeon: Burnell Blanks, MD;  Location: [facility];  Service: Cardiovascular;  Laterality: N/A;  . LEFT HEART CATH AND CORONARY ANGIOGRAPHY N/A 09/20/2019   Procedure: LEFT HEART CATH AND CORONARY ANGIOGRAPHY;  Surgeon: Burnell Blanks, MD;  Location: [facility];  Service: Cardiovascular;  Laterality: N/A;  . SEPTOPLASTY     w/ bilateral inferior turbinate reductions  . TONSILLECTOMY      Allergies as of  06/20/2020      Reactions   Januvia [sitagliptin] Nausea Only, Other (See Comments)   Back Pain      Medication List       Accurate as of June 20, 2020 11:59 PM. If you have any questions, ask your nurse or doctor.        aspirin EC 81 MG tablet Take 81 mg by mouth at bedtime.   blood glucose meter kit and supplies Dispense based on patient and insurance preference. Check blood sugar once daily   carvedilol 6.25 MG tablet Commonly known as: COREG Take 1 tablet (6.25 mg total) by mouth 2 (two) times daily with a meal.   cetirizine 10 MG tablet Commonly known as: ZYRTEC Take 10 mg by mouth daily.   clopidogrel 75 MG tablet Commonly known as: PLAVIX TAKE 1 TABLET EVERY DAY WITH BREAKFAST   ezetimibe-simvastatin 10-10 MG tablet Commonly known as: VYTORIN Take 1 tablet by mouth daily.   glipiZIDE 10 MG 24 hr tablet Commonly known as: GLUCOTROL XL Take 1 tablet (10 mg total) by mouth daily with breakfast.   guaiFENesin 600 MG 12 hr tablet Commonly known as: MUCINEX Take by mouth 2 (two) times daily.   linagliptin 5 MG Tabs tablet Commonly known as: Tradjenta Take 1 tablet (5 mg total) by mouth daily.   lisinopril  20 MG tablet Commonly known as: ZESTRIL Take 1 tablet (20 mg total) by mouth daily.   multivitamin tablet Take 1 tablet by mouth daily.   nitroGLYCERIN 0.4 MG SL tablet Commonly known as: NITROSTAT Place 1 tablet (0.4 mg total) under the tongue every 5 (five) minutes x 3 doses as needed for chest pain.   OneTouch Ultra test strip Generic drug: glucose blood CHECK BLOOD SUGAR 1 DAILY   pantoprazole 40 MG tablet Commonly known as: PROTONIX Take 1 tablet (40 mg total) by mouth 2 (two) times daily before a meal.   pioglitazone-metformin 15-850 MG tablet Commonly known as: ACTOPLUS MET Take 1 tablet by mouth 2 (two) times daily with a meal.   Potassium 99 MG Tabs Take 99 mg by mouth at bedtime.   Steglatro 15 MG Tabs tablet Generic drug:  ertugliflozin L-PyroglutamicAc Take 1 tablet (15 mg total) by mouth daily.          Objective:   Physical Exam BP 117/77 (BP Location: Left Arm, Patient Position: Sitting, Cuff Size: Normal)   Pulse 60   Temp 98 F (36.7 C) (Oral)   Resp 16   Ht 5' 9.75" (1.772 m)   Wt 253 lb 6 oz (114.9 kg)   SpO2 96%   BMI 36.62 kg/m   General: Well developed, NAD, BMI noted Neck: No  thyromegaly  HEENT:  Normocephalic . Face symmetric, atraumatic Lungs:  CTA B Normal respiratory effort, no intercostal retractions, no accessory muscle use. Heart: RRR,  no murmur.  Abdomen:  Not distended, soft, non-tender. No rebound or rigidity.   Lower extremities: Trace pretibial edema bilaterally  DM foot exam: Good pedal pulses, pinprick normal Skin: Exposed areas without rash. Not pale. Not jaundice Neurologic:  alert & oriented X3.  Speech normal, gait appropriate for age and unassisted Strength symmetric and appropriate for age.  Psych: Cognition and judgment appear intact.  Cooperative with normal attention span and concentration.  Behavior appropriate. No anxious or depressed appearing.     Assessment     Assessment DM DX 2008 HTN Hyperlipidemia CAD:  -MI 2001, syncope 2010 -07-2017: SOB, low risk stress test -Catheterization 09/2019: one stent, DAPT for 6 months OSA, CPAP intolerant ED Mild chronic anemia, low iron stores. Saw GI 04-2017: no further w/u  GERD Gallbladder polyps, 2014, surgery 07-2016 Fatty liver 2010 Used to see  Dr Cena Benton Here for CPX [id number] on Actos plus met,  Tradjenta, Farxiga (will switch to [facility] soon), check A1c.  Foot test negative HTN: Seems well controlled, continue carvedilol, lisinopril, checking labs Hyperlipidemia: On Vytorin, well controlled CAD: asx Anemia: Checking a CBC, TIBC. Skin lesions: Reports skin lesions, requests dermatology referral. RTC 6 months    This visit occurred during the SARS-CoV-2 public health  emergency.  Safety protocols were in place, including screening questions prior to the visit, additional usage of staff PPE, and extensive cleaning of exam room while observing appropriate contact time as indicated for disinfecting solutions.

## 2020-06-20 NOTE — Progress Notes (Signed)
Pre visit review using our clinic review tool, if applicable. No additional management support is needed unless otherwise documented below in the visit note. 

## 2020-06-20 NOTE — Patient Instructions (Addendum)
Per our records you are due for an eye exam. Please contact your eye doctor to schedule an appointment. Please have them send copies of your office visit notes to [place]. Our fax number is [phone].  [facility]- [phone]    GO TO THE LAB : Get the blood work     Kenneth, [place] back for a checkup in 6 months  STOP BY THE FIRST FLOOR: Schedule your bone density test

## 2020-06-21 ENCOUNTER — Encounter: Payer: Self-pay | Admitting: Internal Medicine

## 2020-06-21 LAB — COMPREHENSIVE METABOLIC PANEL
AG Ratio: 1.7 (calc) (ref 1.0–2.5)
ALT: 15 U/L (ref 9–46)
AST: 21 U/L (ref 10–35)
Albumin: 4.1 g/dL (ref 3.6–5.1)
Alkaline phosphatase (APISO): 58 U/L (ref 35–144)
BUN: 16 mg/dL (ref 7–25)
CO2: 23 mmol/L (ref 20–32)
Calcium: 9.3 mg/dL (ref 8.6–10.3)
Chloride: 107 mmol/L (ref 98–110)
Creat: 0.91 mg/dL (ref 0.70–1.18)
Globulin: 2.4 g/dL (calc) (ref 1.9–3.7)
Glucose, Bld: 91 mg/dL (ref 65–99)
Potassium: 5.2 mmol/L (ref 3.5–5.3)
Sodium: 139 mmol/L (ref 135–146)
Total Bilirubin: 0.4 mg/dL (ref 0.2–1.2)
Total Protein: 6.5 g/dL (ref 6.1–8.1)

## 2020-06-21 LAB — CBC WITH DIFFERENTIAL/PLATELET
Absolute Monocytes: 614 cells/uL (ref 200–950)
Basophils Absolute: 30 cells/uL (ref 0–200)
Basophils Relative: 0.5 %
Eosinophils Absolute: 189 cells/uL (ref 15–500)
Eosinophils Relative: 3.2 %
HCT: 37.4 % — ABNORMAL LOW (ref 38.5–50.0)
Hemoglobin: 12.4 g/dL — ABNORMAL LOW (ref 13.2–17.1)
Lymphs Abs: 1003 cells/uL (ref 850–3900)
MCH: 31.4 pg (ref 27.0–33.0)
MCHC: 33.2 g/dL (ref 32.0–36.0)
MCV: 94.7 fL (ref 80.0–100.0)
MPV: 12 fL (ref 7.5–12.5)
Monocytes Relative: 10.4 %
Neutro Abs: 4065 cells/uL (ref 1500–7800)
Neutrophils Relative %: 68.9 %
Platelets: 203 10*3/uL (ref 140–400)
RBC: 3.95 10*6/uL — ABNORMAL LOW (ref 4.20–5.80)
RDW: 15.1 % — ABNORMAL HIGH (ref 11.0–15.0)
Total Lymphocyte: 17 %
WBC: 5.9 10*3/uL (ref 3.8–10.8)

## 2020-06-21 LAB — TSH: TSH: 1.5 mIU/L (ref 0.40–4.50)

## 2020-06-21 LAB — FERRITIN: Ferritin: 6 ng/mL — ABNORMAL LOW (ref 24–380)

## 2020-06-21 LAB — HEMOGLOBIN A1C
Hgb A1c MFr Bld: 6.5 % of total Hgb — ABNORMAL HIGH (ref <5.7)
Mean Plasma Glucose: 140 (calc)
eAG (mmol/L): 7.7 (calc)

## 2020-06-21 LAB — TRANSFERRIN: Transferrin: 361 mg/dL — ABNORMAL HIGH (ref 188–341)

## 2020-06-21 NOTE — Assessment & Plan Note (Signed)
--  Td 12-2015; pneumonia 2008 and 2018; PNM 13: 2015; zostavax 2014; s/p shingrex  - had C-19 vaccines - req flu shot q year --CCS: had Cscope 2003 negative ,again in 2014 (Dr Oletta Lamas).  Subsequently saw Dr. Ardis Hughs and he recommended next colonoscopy 2024. -Prostate cancer screening: DRE PSA wnl 2020 -- Diet: better, + portion control; still active. --labs:  CMP, CBC, TIBC, A1c, TSH -Decrease height noted: Rx DEXA for osteoporosis screening

## 2020-06-21 NOTE — Assessment & Plan Note (Signed)
Here for CPX [id number] on Actos plus met,  Tradjenta, [facility] (will switch to [facility] soon), check A1c.  Foot test negative HTN: Seems well controlled, continue carvedilol, lisinopril, checking labs Hyperlipidemia: On Vytorin, well controlled CAD: asx Anemia: Checking a CBC, TIBC. Skin lesions: Reports skin lesions, requests dermatology referral. RTC 6 months

## 2020-06-23 ENCOUNTER — Encounter: Payer: Self-pay | Admitting: Internal Medicine

## 2020-06-23 ENCOUNTER — Other Ambulatory Visit: Payer: Self-pay | Admitting: Internal Medicine

## 2020-06-23 ENCOUNTER — Ambulatory Visit (HOSPITAL_BASED_OUTPATIENT_CLINIC_OR_DEPARTMENT_OTHER)
Admission: RE | Admit: 2020-06-23 | Discharge: 2020-06-23 | Disposition: A | Discharge status: Home / Self Care | Payer: Medicare HMO | Source: Ambulatory Visit | Attending: Internal Medicine | Admitting: Internal Medicine

## 2020-06-23 ENCOUNTER — Other Ambulatory Visit: Payer: Self-pay

## 2020-06-23 DIAGNOSIS — Z7984 Long term (current) use of oral hypoglycemic drugs: Secondary | ICD-10-CM | POA: Insufficient documentation

## 2020-06-23 DIAGNOSIS — Z87891 Personal history of nicotine dependence: Secondary | ICD-10-CM | POA: Insufficient documentation

## 2020-06-23 DIAGNOSIS — Z13820 Encounter for screening for osteoporosis: Secondary | ICD-10-CM | POA: Diagnosis not present

## 2020-06-23 DIAGNOSIS — M8588 Other specified disorders of bone density and structure, other site: Secondary | ICD-10-CM | POA: Insufficient documentation

## 2020-06-23 DIAGNOSIS — M85852 Other specified disorders of bone density and structure, left thigh: Secondary | ICD-10-CM | POA: Diagnosis not present

## 2020-06-23 DIAGNOSIS — R29890 Loss of height: Secondary | ICD-10-CM | POA: Diagnosis not present

## 2020-06-23 DIAGNOSIS — E119 Type 2 diabetes mellitus without complications: Secondary | ICD-10-CM | POA: Diagnosis not present

## 2020-06-23 NOTE — Addendum Note (Signed)
Addended byDamita Dunnings D on: 06/23/2020 04:50 PM   Modules accepted: Orders

## 2020-06-23 NOTE — Addendum Note (Signed)
Addended byDamita Dunnings D on: 06/23/2020 04:52 PM   Modules accepted: Orders

## 2020-06-23 NOTE — Telephone Encounter (Signed)
Pt needing refill on glipizide. Labs completed on 06/20/20- okay to refill?

## 2020-06-23 NOTE — Telephone Encounter (Signed)
Okay RF  6 months

## 2020-06-26 ENCOUNTER — Other Ambulatory Visit: Payer: Self-pay | Admitting: Cardiology

## 2020-06-27 NOTE — Addendum Note (Signed)
Addended byDamita Dunnings D on: 06/27/2020 09:22 AM   Modules accepted: Orders

## 2020-06-27 NOTE — Addendum Note (Signed)
Addended byDamita Dunnings D on: 06/27/2020 09:19 AM   Modules accepted: Orders

## 2020-07-19 DIAGNOSIS — R69 Illness, unspecified: Secondary | ICD-10-CM | POA: Diagnosis not present

## 2020-07-27 ENCOUNTER — Other Ambulatory Visit: Payer: Self-pay | Admitting: Internal Medicine

## 2020-08-10 ENCOUNTER — Encounter: Payer: Self-pay | Admitting: Internal Medicine

## 2020-08-14 NOTE — Addendum Note (Signed)
Addended by: Kelle Darting A on: 08/14/2020 03:19 PM   Modules accepted: Orders

## 2020-08-14 NOTE — Addendum Note (Signed)
Addended by: Kelle Darting A on: 08/14/2020 03:16 PM   Modules accepted: Orders

## 2020-08-15 ENCOUNTER — Other Ambulatory Visit: Payer: Self-pay

## 2020-08-15 ENCOUNTER — Other Ambulatory Visit (INDEPENDENT_AMBULATORY_CARE_PROVIDER_SITE_OTHER): Payer: Medicare HMO

## 2020-08-15 DIAGNOSIS — D649 Anemia, unspecified: Secondary | ICD-10-CM | POA: Diagnosis not present

## 2020-08-15 LAB — IBC PANEL
Iron: 76 ug/dL (ref 42–165)
Saturation Ratios: 14.8 % — ABNORMAL LOW (ref 20.0–50.0)
Transferrin: 368 mg/dL — ABNORMAL HIGH (ref 212.0–360.0)

## 2020-08-15 LAB — B12 AND FOLATE PANEL
Folate: 21.7 ng/mL (ref >5.9)
Vitamin B-12: 267 pg/mL (ref 211–911)

## 2020-08-15 LAB — FERRITIN: Ferritin: 7.8 ng/mL — ABNORMAL LOW (ref 22.0–322.0)

## 2020-08-17 LAB — GLIA(IGA)+IGA+TTG(IGA)
Antigliadin Abs, IgA: 3 units (ref 0–19)
IgA/Immunoglobulin A, Serum: 197 mg/dL (ref 61–437)
Transglutaminase IgA: <2 U/mL (ref 0–3)

## 2020-08-22 MED ORDER — FERROUS FUMARATE 325 (106 FE) MG PO TABS: 1.0000 | ORAL_TABLET | Freq: Two times a day (BID) | ORAL | 6 refills | Status: DC

## 2020-08-22 NOTE — Addendum Note (Signed)
Addended byDamita Dunnings D on: 08/22/2020 08:14 AM   Modules accepted: Orders

## 2020-09-12 DIAGNOSIS — M62830 Muscle spasm of back: Secondary | ICD-10-CM | POA: Diagnosis not present

## 2020-09-12 DIAGNOSIS — M9905 Segmental and somatic dysfunction of pelvic region: Secondary | ICD-10-CM | POA: Diagnosis not present

## 2020-09-12 DIAGNOSIS — M9903 Segmental and somatic dysfunction of lumbar region: Secondary | ICD-10-CM | POA: Diagnosis not present

## 2020-09-12 DIAGNOSIS — M5451 Vertebrogenic low back pain: Secondary | ICD-10-CM | POA: Diagnosis not present

## 2020-09-12 DIAGNOSIS — M47817 Spondylosis without myelopathy or radiculopathy, lumbosacral region: Secondary | ICD-10-CM | POA: Diagnosis not present

## 2020-09-12 DIAGNOSIS — M5137 Other intervertebral disc degeneration, lumbosacral region: Secondary | ICD-10-CM | POA: Diagnosis not present

## 2020-09-12 DIAGNOSIS — M9904 Segmental and somatic dysfunction of sacral region: Secondary | ICD-10-CM | POA: Diagnosis not present

## 2020-09-12 DIAGNOSIS — M5136 Other intervertebral disc degeneration, lumbar region: Secondary | ICD-10-CM | POA: Diagnosis not present

## 2020-10-06 ENCOUNTER — Other Ambulatory Visit: Payer: Self-pay | Admitting: Internal Medicine

## 2020-10-10 DIAGNOSIS — M9901 Segmental and somatic dysfunction of cervical region: Secondary | ICD-10-CM | POA: Diagnosis not present

## 2020-10-10 DIAGNOSIS — M50321 Other cervical disc degeneration at C4-C5 level: Secondary | ICD-10-CM | POA: Diagnosis not present

## 2020-10-10 DIAGNOSIS — M47817 Spondylosis without myelopathy or radiculopathy, lumbosacral region: Secondary | ICD-10-CM | POA: Diagnosis not present

## 2020-10-10 DIAGNOSIS — M62830 Muscle spasm of back: Secondary | ICD-10-CM | POA: Diagnosis not present

## 2020-10-10 DIAGNOSIS — M5451 Vertebrogenic low back pain: Secondary | ICD-10-CM | POA: Diagnosis not present

## 2020-10-10 DIAGNOSIS — M5136 Other intervertebral disc degeneration, lumbar region: Secondary | ICD-10-CM | POA: Diagnosis not present

## 2020-10-10 DIAGNOSIS — M5137 Other intervertebral disc degeneration, lumbosacral region: Secondary | ICD-10-CM | POA: Diagnosis not present

## 2020-10-10 DIAGNOSIS — M9905 Segmental and somatic dysfunction of pelvic region: Secondary | ICD-10-CM | POA: Diagnosis not present

## 2020-10-10 DIAGNOSIS — M9904 Segmental and somatic dysfunction of sacral region: Secondary | ICD-10-CM | POA: Diagnosis not present

## 2020-10-10 DIAGNOSIS — M9903 Segmental and somatic dysfunction of lumbar region: Secondary | ICD-10-CM | POA: Diagnosis not present

## 2020-10-12 DIAGNOSIS — M62830 Muscle spasm of back: Secondary | ICD-10-CM | POA: Diagnosis not present

## 2020-10-12 DIAGNOSIS — M47817 Spondylosis without myelopathy or radiculopathy, lumbosacral region: Secondary | ICD-10-CM | POA: Diagnosis not present

## 2020-10-12 DIAGNOSIS — M5136 Other intervertebral disc degeneration, lumbar region: Secondary | ICD-10-CM | POA: Diagnosis not present

## 2020-10-12 DIAGNOSIS — M5137 Other intervertebral disc degeneration, lumbosacral region: Secondary | ICD-10-CM | POA: Diagnosis not present

## 2020-10-12 DIAGNOSIS — M5451 Vertebrogenic low back pain: Secondary | ICD-10-CM | POA: Diagnosis not present

## 2020-10-12 DIAGNOSIS — M9901 Segmental and somatic dysfunction of cervical region: Secondary | ICD-10-CM | POA: Diagnosis not present

## 2020-10-12 DIAGNOSIS — M9903 Segmental and somatic dysfunction of lumbar region: Secondary | ICD-10-CM | POA: Diagnosis not present

## 2020-10-12 DIAGNOSIS — M9904 Segmental and somatic dysfunction of sacral region: Secondary | ICD-10-CM | POA: Diagnosis not present

## 2020-10-12 DIAGNOSIS — M50321 Other cervical disc degeneration at C4-C5 level: Secondary | ICD-10-CM | POA: Diagnosis not present

## 2020-10-12 DIAGNOSIS — M9905 Segmental and somatic dysfunction of pelvic region: Secondary | ICD-10-CM | POA: Diagnosis not present

## 2020-10-16 DIAGNOSIS — M50321 Other cervical disc degeneration at C4-C5 level: Secondary | ICD-10-CM | POA: Diagnosis not present

## 2020-10-16 DIAGNOSIS — M47817 Spondylosis without myelopathy or radiculopathy, lumbosacral region: Secondary | ICD-10-CM | POA: Diagnosis not present

## 2020-10-16 DIAGNOSIS — M9903 Segmental and somatic dysfunction of lumbar region: Secondary | ICD-10-CM | POA: Diagnosis not present

## 2020-10-16 DIAGNOSIS — M5136 Other intervertebral disc degeneration, lumbar region: Secondary | ICD-10-CM | POA: Diagnosis not present

## 2020-10-16 DIAGNOSIS — M9905 Segmental and somatic dysfunction of pelvic region: Secondary | ICD-10-CM | POA: Diagnosis not present

## 2020-10-16 DIAGNOSIS — M5451 Vertebrogenic low back pain: Secondary | ICD-10-CM | POA: Diagnosis not present

## 2020-10-16 DIAGNOSIS — M5137 Other intervertebral disc degeneration, lumbosacral region: Secondary | ICD-10-CM | POA: Diagnosis not present

## 2020-10-16 DIAGNOSIS — M9901 Segmental and somatic dysfunction of cervical region: Secondary | ICD-10-CM | POA: Diagnosis not present

## 2020-10-16 DIAGNOSIS — M62830 Muscle spasm of back: Secondary | ICD-10-CM | POA: Diagnosis not present

## 2020-10-16 DIAGNOSIS — M9904 Segmental and somatic dysfunction of sacral region: Secondary | ICD-10-CM | POA: Diagnosis not present

## 2020-10-17 ENCOUNTER — Other Ambulatory Visit: Payer: Self-pay

## 2020-10-18 ENCOUNTER — Other Ambulatory Visit: Payer: Self-pay

## 2020-10-18 MED ORDER — GLIPIZIDE ER 10 MG PO TB24
10.0000 mg | ORAL_TABLET | Freq: Every day | ORAL | 0 refills | Status: DC
Start: 2020-10-18 — End: 2021-01-23

## 2020-10-18 NOTE — Telephone Encounter (Signed)
Error

## 2020-10-19 ENCOUNTER — Other Ambulatory Visit: Payer: Self-pay

## 2020-10-19 MED ORDER — LINAGLIPTIN 5 MG PO TABS
5.0000 mg | ORAL_TABLET | Freq: Every day | ORAL | 0 refills | Status: DC
Start: 2020-10-19 — End: 2021-01-10

## 2020-10-27 ENCOUNTER — Encounter: Payer: Self-pay | Admitting: Internal Medicine

## 2020-10-27 ENCOUNTER — Other Ambulatory Visit: Payer: Self-pay | Admitting: Internal Medicine

## 2020-10-27 MED ORDER — STEGLATRO 15 MG PO TABS
15.0000 mg | ORAL_TABLET | Freq: Every day | ORAL | 1 refills | Status: DC
Start: 2020-10-27 — End: 2020-11-20

## 2020-11-02 ENCOUNTER — Telehealth: Payer: Self-pay | Admitting: Internal Medicine

## 2020-11-02 MED ORDER — PIOGLITAZONE HCL-METFORMIN HCL 15-850 MG PO TABS
1.0000 | ORAL_TABLET | Freq: Two times a day (BID) | ORAL | 0 refills | Status: DC
Start: 2020-11-02 — End: 2021-01-10

## 2020-11-02 NOTE — Telephone Encounter (Signed)
Rx resent.

## 2020-11-02 NOTE — Telephone Encounter (Signed)
[facility], they have not received patient medication refill for :    pioglitazone-metformin (ACTOPLUS MET) 15-850 MG tablet [[id number]]    [facility] - [place], [place] - [address] AT [facility]  [address], [place] [address]  Phone:  [phone] Fax:  [phone]  DEA #:  --  Pharmacy is requesting us to resend medication  

## 2020-11-18 ENCOUNTER — Encounter: Payer: Self-pay | Admitting: Internal Medicine

## 2020-11-20 MED ORDER — DAPAGLIFLOZIN PROPANEDIOL 10 MG PO TABS: 10.0000 mg | ORAL_TABLET | Freq: Every day | ORAL | 1 refills | Status: DC

## 2020-11-29 ENCOUNTER — Other Ambulatory Visit: Payer: Self-pay | Admitting: Internal Medicine

## 2020-12-09 DIAGNOSIS — E119 Type 2 diabetes mellitus without complications: Secondary | ICD-10-CM | POA: Diagnosis not present

## 2020-12-09 DIAGNOSIS — M549 Dorsalgia, unspecified: Secondary | ICD-10-CM | POA: Diagnosis not present

## 2020-12-09 DIAGNOSIS — G8929 Other chronic pain: Secondary | ICD-10-CM | POA: Diagnosis not present

## 2020-12-09 DIAGNOSIS — R300 Dysuria: Secondary | ICD-10-CM | POA: Diagnosis not present

## 2020-12-09 DIAGNOSIS — R81 Glycosuria: Secondary | ICD-10-CM | POA: Diagnosis not present

## 2020-12-13 ENCOUNTER — Ambulatory Visit (INDEPENDENT_AMBULATORY_CARE_PROVIDER_SITE_OTHER): Payer: Medicare HMO | Admitting: Internal Medicine

## 2020-12-13 ENCOUNTER — Other Ambulatory Visit: Payer: Self-pay

## 2020-12-13 VITALS — BP 123/77 | HR 62 | Temp 98.2°F | Ht 69.5 in | Wt 245.0 lb

## 2020-12-13 DIAGNOSIS — D649 Anemia, unspecified: Secondary | ICD-10-CM | POA: Diagnosis not present

## 2020-12-13 DIAGNOSIS — N401 Enlarged prostate with lower urinary tract symptoms: Secondary | ICD-10-CM | POA: Diagnosis not present

## 2020-12-13 DIAGNOSIS — E118 Type 2 diabetes mellitus with unspecified complications: Secondary | ICD-10-CM | POA: Diagnosis not present

## 2020-12-13 DIAGNOSIS — E785 Hyperlipidemia, unspecified: Secondary | ICD-10-CM

## 2020-12-13 DIAGNOSIS — I10 Essential (primary) hypertension: Secondary | ICD-10-CM

## 2020-12-13 DIAGNOSIS — N419 Inflammatory disease of prostate, unspecified: Secondary | ICD-10-CM

## 2020-12-13 LAB — POC URINALSYSI DIPSTICK (AUTOMATED)
Blood, UA: NEGATIVE
Glucose, UA: NEGATIVE
Ketones, UA: NEGATIVE
Leukocytes, UA: NEGATIVE
Nitrite, UA: NEGATIVE
Protein, UA: POSITIVE — AB
Spec Grav, UA: 1.025 (ref 1.010–1.025)
Urobilinogen, UA: 0.2 E.U./dL
pH, UA: 6 (ref 5.0–8.0)

## 2020-12-13 LAB — CBC WITH DIFFERENTIAL/PLATELET
Basophils Absolute: 0 10*3/uL (ref 0.0–0.1)
Basophils Relative: 0.6 % (ref 0.0–3.0)
Eosinophils Absolute: 0.2 10*3/uL (ref 0.0–0.7)
Eosinophils Relative: 4 % (ref 0.0–5.0)
HCT: 39.8 % (ref 39.0–52.0)
Hemoglobin: 13.2 g/dL (ref 13.0–17.0)
Lymphocytes Relative: 16.1 % (ref 12.0–46.0)
Lymphs Abs: 0.8 10*3/uL (ref 0.7–4.0)
MCHC: 33.1 g/dL (ref 30.0–36.0)
MCV: 101.6 fl — ABNORMAL HIGH (ref 78.0–100.0)
Monocytes Absolute: 0.5 10*3/uL (ref 0.1–1.0)
Monocytes Relative: 10.4 % (ref 3.0–12.0)
Neutro Abs: 3.5 10*3/uL (ref 1.4–7.7)
Neutrophils Relative %: 68.9 % (ref 43.0–77.0)
Platelets: 189 10*3/uL (ref 150.0–400.0)
RBC: 3.92 Mil/uL — ABNORMAL LOW (ref 4.22–5.81)
RDW: 16 % — ABNORMAL HIGH (ref 11.5–15.5)
WBC: 5.1 10*3/uL (ref 4.0–10.5)

## 2020-12-13 LAB — IRON: Iron: 83 ug/dL (ref 42–165)

## 2020-12-13 LAB — BASIC METABOLIC PANEL
BUN: 14 mg/dL (ref 6–23)
CO2: 27 mEq/L (ref 19–32)
Calcium: 9 mg/dL (ref 8.4–10.5)
Chloride: 104 mEq/L (ref 96–112)
Creatinine, Ser: 0.92 mg/dL (ref 0.40–1.50)
GFR: 83.38 mL/min (ref >60.00)
Glucose, Bld: 89 mg/dL (ref 70–99)
Potassium: 4.1 mEq/L (ref 3.5–5.1)
Sodium: 140 mEq/L (ref 135–145)

## 2020-12-13 LAB — URINALYSIS
Bilirubin Urine: NEGATIVE
Hgb urine dipstick: NEGATIVE
Ketones, ur: NEGATIVE
Leukocytes,Ua: NEGATIVE
Nitrite: NEGATIVE
Specific Gravity, Urine: 1.025 (ref 1.000–1.030)
Total Protein, Urine: NEGATIVE
Urine Glucose: >=1000 — AB
Urobilinogen, UA: 1 (ref 0.0–1.0)
pH: 6 (ref 5.0–8.0)

## 2020-12-13 LAB — LIPID PANEL
Cholesterol: 117 mg/dL (ref 0–200)
HDL: 42.3 mg/dL (ref >39.00)
LDL Cholesterol: 57 mg/dL (ref 0–99)
NonHDL: 74.6
Total CHOL/HDL Ratio: 3
Triglycerides: 90 mg/dL (ref 0.0–149.0)
VLDL: 18 mg/dL (ref 0.0–40.0)

## 2020-12-13 LAB — HEMOGLOBIN A1C: Hgb A1c MFr Bld: 6.8 % — ABNORMAL HIGH (ref 4.6–6.5)

## 2020-12-13 LAB — PSA: PSA: 24.44 ng/mL — ABNORMAL HIGH (ref 0.10–4.00)

## 2020-12-13 LAB — FERRITIN: Ferritin: 51.2 ng/mL (ref 22.0–322.0)

## 2020-12-13 MED ORDER — SULFAMETHOXAZOLE-TRIMETHOPRIM 800-160 MG PO TABS: 1.0000 | ORAL_TABLET | Freq: Two times a day (BID) | ORAL | 0 refills | Status: DC

## 2020-12-13 NOTE — Patient Instructions (Signed)
°  GO TO THE LAB : Get the blood work   ° ° °GO TO THE FRONT DESK, PLEASE SCHEDULE YOUR APPOINTMENTS °Come back for a checkup in 3 months °

## 2020-12-13 NOTE — Progress Notes (Signed)
Subjective:    Patient ID: Daniel Cruz, male    DOB: 1951/09/08, 70 y.o.   MRN: [id number]  DOS:  12/13/2020 Type of visit - description: 37-month follow-up  Here for a 25-month follow-up, he also has an acute problem.  On March 4, started with dysuria, it got gradually worse. He was visiting [place], went to the ER next day, UA + for sugar but "no infection".  Blood work was okay, BP was okay.  No records. Since then continue with dysuria.  Denies fever chills. No nausea, vomiting or flank pain. No gross hematuria Urinary frequency slightly above baseline, some difficulty urinating.  DM: Good med compliance, no ambulatory CBGs.  Wt Readings from Last 3 Encounters:  12/13/20 245 lb (111.1 kg)  06/20/20 253 lb 6 oz (114.9 kg)  03/16/20 259 lb (117.5 kg)     Review of Systems Denies chest pain, difficulty breathing or lower extremity edema  Past Medical History:  Diagnosis Date  . Anemia, iron deficiency    hx  . CAD (coronary artery disease)     NSTEMI with PCI of the mid and distal RCA with 50-60% LAD 09/2000, s/p cath 01/2009 with patent RCA stent and 90% LAD s/p PCI of LAD and right PDA  . Cholelithiasis   . DIABETES MELLITUS, TYPE II 02/26/2007  . Fatty liver 02/2009   per u/s  . GERD (gastroesophageal reflux disease)   . HTN (hypertension)   . Myocardial infarction ([place])    2001  . OSA (obstructive sleep apnea)    can't tol. a CPAP  . Plantar fasciitis of left foot   . PVC (premature ventricular contraction) 06/06/2017  . Sleep apnea   . UTI (lower urinary tract infection)    in the 80s and 11/09    Past Surgical History:  Procedure Laterality Date  . CARDIAC CATHETERIZATION  10/2008   stents  . CHOLECYSTECTOMY N/A 07/16/2016   Procedure: LAPAROSCOPIC CHOLECYSTECTOMY WITH INTRAOPERATIVE CHOLANGIOGRAM;  Surgeon: [name], MD;  Location: [facility];  Service: General;  Laterality: N/A;  . CORONARY STENT INTERVENTION N/A 09/20/2019   Procedure: CORONARY  STENT INTERVENTION;  Surgeon: Burnell Blanks, MD;  Location: [facility] [facility];  Service: Cardiovascular;  Laterality: N/A;  . LEFT HEART CATH AND CORONARY ANGIOGRAPHY N/A 09/20/2019   Procedure: LEFT HEART CATH AND CORONARY ANGIOGRAPHY;  Surgeon: Burnell Blanks, MD;  Location: [facility];  Service: Cardiovascular;  Laterality: N/A;  . SEPTOPLASTY     w/ bilateral inferior turbinate reductions  . TONSILLECTOMY      Allergies as of 12/13/2020      Reactions   Januvia [sitagliptin] Nausea Only, Other (See Comments)   Back Pain      Medication List       Accurate as of December 13, 2020 11:59 PM. If you have any questions, ask your nurse or doctor.        aspirin EC 81 MG tablet Take 81 mg by mouth at bedtime.   blood glucose meter kit and supplies Dispense based on patient and insurance preference. Check blood sugar once daily   carvedilol 6.25 MG tablet Commonly known as: COREG Take 1 tablet (6.25 mg total) by mouth 2 (two) times daily with a meal.   cetirizine 10 MG tablet Commonly known as: ZYRTEC Take 10 mg by mouth daily.   clopidogrel 75 MG tablet Commonly known as: PLAVIX TAKE 1 TABLET EVERY DAY WITH BREAKFAST   dapagliflozin propanediol 10 MG Tabs  tablet Commonly known as: Farxiga Take 1 tablet (10 mg total) by mouth daily before breakfast.   ezetimibe-simvastatin 10-10 MG tablet Commonly known as: VYTORIN Take 1 tablet by mouth daily.   ferrous fumarate 325 (106 Fe) MG Tabs tablet Commonly known as: HEMOCYTE - 106 mg FE Take 1 tablet (106 mg of iron total) by mouth 2 (two) times daily before a meal.   glipiZIDE 10 MG 24 hr tablet Commonly known as: GLUCOTROL XL Take 1 tablet (10 mg total) by mouth daily with breakfast.   guaiFENesin 600 MG 12 hr tablet Commonly known as: MUCINEX Take by mouth 2 (two) times daily.   linagliptin 5 MG Tabs tablet Commonly known as: Tradjenta Take 1 tablet (5 mg total) by mouth daily.   lisinopril  20 MG tablet Commonly known as: ZESTRIL TAKE 1 TABLET BY MOUTH EVERY DAY   multivitamin tablet Take 1 tablet by mouth daily.   nitroGLYCERIN 0.4 MG SL tablet Commonly known as: NITROSTAT Place 1 tablet (0.4 mg total) under the tongue every 5 (five) minutes x 3 doses as needed for chest pain.   OneTouch Ultra test strip Generic drug: glucose blood CHECK BLOOD SUGAR 1 DAILY   pantoprazole 40 MG tablet Commonly known as: PROTONIX Take 1 tablet (40 mg total) by mouth 2 (two) times daily before a meal.   pioglitazone-metformin 15-850 MG tablet Commonly known as: ACTOPLUS MET Take 1 tablet by mouth 2 (two) times daily with a meal.   Potassium 99 MG Tabs Take 99 mg by mouth at bedtime.   sulfamethoxazole-trimethoprim 800-160 MG tablet Commonly known as: BACTRIM DS Take 1 tablet by mouth 2 (two) times daily. Started by: Kathlene November, MD          Objective:   Physical Exam BP 123/77 (BP Location: Right Arm, Patient Position: Sitting, Cuff Size: Large)   Pulse 62   Temp 98.2 F (36.8 C) (Oral)   Ht 5' 9.5" (1.765 m)   Wt 245 lb (111.1 kg)   SpO2 94%   BMI 35.66 kg/m  General:   Well developed, NAD, BMI noted.  HEENT:  Normocephalic . Face symmetric, atraumatic Lungs:  CTA B Normal respiratory effort, no intercostal retractions, no accessory muscle use. Heart: RRR,  no murmur.  Abdomen:  Not distended, soft, non-tender. No rebound or rigidity.   Skin: Not pale. Not jaundice DRE: Normal sphincter tone, no stools.  Prostate is slightly large, symmetric, not nodular or tender. Lower extremities: no pretibial edema bilaterally  Neurologic:  alert & oriented X3.  Speech normal, gait appropriate for age and unassisted Psych--  Cognition and judgment appear intact.  Cooperative with normal attention span and concentration.  Behavior appropriate. No anxious or depressed appearing.     Assessment      Assessment DM DX 2008 HTN Hyperlipidemia CAD:  -MI 2001,  syncope 2010 -07-2017: SOB, low risk stress test -Catheterization 09/2019: one stent, DAPT for 6 months OSA, CPAP intolerant ED Mild chronic anemia, low iron stores.  Normal colonoscopy 2014, EGD 03/2019: H. pylori negative.   Saw GI 04-2017: no further w/u  GERD Gallbladder polyps, 2014, surgery 07-2016 Fatty liver 2010 Used to see  Dr Allyson Sabal  PLAN DM: Last A1c 6.5, controlled, recheck A1c today. Continue Steglatro (soon to start [place]), Glucotrol, Tradjenta, Actos plus met. HTN: On carvedilol, lisinopril.  Well-controlled, check BMP High cholesterol: Continue Vytorin, check FLP. CAD: asx LUTS: Symptoms a started 5 days ago, mostly dysuria and urinary frequency.  Was evaluated at  the ER in [place], no records. DRE today showed some enlargement but not tenderness.  U dip + glucose. DDx: Prostatitis, UTI, others. Plan: PSA, UA, urine culture.  Further advised with results Addendum: PSA 24.4. Plan: Bactrim DS 1 p.o. twice daily for 2 weeks, will need more antibiotics depending on the urine culture. Anemia: Chronic mild anemia, low Ferritin, started iron few months ago, rechecking labs. Weight loss noted, he is doing great with diet, "I felt tired of being overweight". RTC 3 months  This visit occurred during the SARS-CoV-2 public health emergency.  Safety protocols were in place, including screening questions prior to the visit, additional usage of staff PPE, and extensive cleaning of exam room while observing appropriate contact time as indicated for disinfecting solutions.

## 2020-12-14 LAB — URINE CULTURE
MICRO NUMBER:: 11626621
Result:: NO GROWTH
SPECIMEN QUALITY:: ADEQUATE

## 2020-12-14 NOTE — Assessment & Plan Note (Signed)
DM: Last A1c 6.5, controlled, recheck A1c today. Continue Steglatro (soon to start [place]), Glucotrol, Tradjenta, Actos plus met. HTN: On carvedilol, lisinopril.  Well-controlled, check BMP High cholesterol: Continue Vytorin, check FLP. CAD: asx LUTS: Symptoms a started 5 days ago, mostly dysuria and urinary frequency.  Was evaluated at the ER in [place], no records. DRE today showed some enlargement but not tenderness.  U dip + glucose. DDx: Prostatitis, UTI, others. Plan: PSA, UA, urine culture.  Further advised with results Addendum: PSA 24.4. Plan: Bactrim DS 1 p.o. twice daily for 2 weeks, will need more antibiotics depending on the urine culture. Anemia: Chronic mild anemia, low Ferritin, started iron few months ago, rechecking labs. Weight loss noted, he is doing great with diet, "I felt tired of being overweight". RTC 3 months

## 2020-12-17 ENCOUNTER — Encounter: Payer: Self-pay | Admitting: Internal Medicine

## 2020-12-18 ENCOUNTER — Ambulatory Visit: Payer: Medicare HMO | Admitting: Internal Medicine

## 2020-12-18 MED ORDER — SULFAMETHOXAZOLE-TRIMETHOPRIM 800-160 MG PO TABS: 1.0000 | ORAL_TABLET | Freq: Two times a day (BID) | ORAL | 0 refills | Status: DC

## 2020-12-18 NOTE — Addendum Note (Signed)
Addended byDamita Dunnings D on: 12/18/2020 08:13 AM   Modules accepted: Orders

## 2021-01-10 ENCOUNTER — Other Ambulatory Visit: Payer: Self-pay | Admitting: Internal Medicine

## 2021-01-22 ENCOUNTER — Other Ambulatory Visit: Payer: Self-pay

## 2021-01-22 MED ORDER — EZETIMIBE-SIMVASTATIN 10-10 MG PO TABS: 1.0000 | ORAL_TABLET | Freq: Every day | ORAL | 3 refills | Status: DC

## 2021-01-23 ENCOUNTER — Encounter: Payer: Self-pay | Admitting: Internal Medicine

## 2021-01-23 MED ORDER — LINAGLIPTIN 5 MG PO TABS: 5.0000 mg | ORAL_TABLET | Freq: Every day | ORAL | 1 refills | Status: DC

## 2021-01-23 MED ORDER — GLIPIZIDE ER 10 MG PO TB24: 10.0000 mg | ORAL_TABLET | Freq: Every day | ORAL | 0 refills | Status: DC

## 2021-01-23 MED ORDER — GLIPIZIDE ER 10 MG PO TB24: 10.0000 mg | ORAL_TABLET | Freq: Every day | ORAL | 1 refills | Status: DC

## 2021-01-23 MED ORDER — PANTOPRAZOLE SODIUM 40 MG PO TBEC: 40.0000 mg | DELAYED_RELEASE_TABLET | Freq: Two times a day (BID) | ORAL | 3 refills | Status: DC

## 2021-01-31 ENCOUNTER — Other Ambulatory Visit: Payer: Self-pay | Admitting: Internal Medicine

## 2021-02-02 ENCOUNTER — Telehealth: Payer: Self-pay

## 2021-02-02 NOTE — Progress Notes (Signed)
[facility])                                            [place] Team                                        Statin Quality Measure Assessment    02/02/2021  BURDETTE FOREHAND 1950-12-28 [id number]  Per review of chart and payor information, this patient has been flagged for non-adherence to the following CMS Quality Measure:   [x]  Statin Use in Persons with Diabetes  [x]  Statin Use in Persons with Cardiovascular Disease  Currently prescribed statin:  [x]  Yes []  No     Comments: Vytorin 10/10 mg QD - I called patient to confirm he received his statin medication and he has not. However, he stated that the medication is in process. Additionally, he informed me [facility]. As a result, he was only dispensed 7 tablets of glipizise to be bridged until he receives [facility]. I informed the patient that I will tell Dr. Larose Kells patient needs a new prescription for glipizide.    Thank you for your time,  Kristeen Miss, [place] Cell: [phone]

## 2021-02-11 ENCOUNTER — Encounter: Payer: Self-pay | Admitting: Internal Medicine

## 2021-02-12 MED ORDER — PIOGLITAZONE HCL-METFORMIN HCL 15-850 MG PO TABS: 1.0000 | ORAL_TABLET | Freq: Two times a day (BID) | ORAL | 1 refills | Status: DC

## 2021-02-17 ENCOUNTER — Other Ambulatory Visit: Payer: Self-pay | Admitting: Internal Medicine

## 2021-02-26 ENCOUNTER — Telehealth: Payer: Self-pay | Admitting: Cardiology

## 2021-02-26 MED ORDER — NITROGLYCERIN 0.4 MG SL SUBL: 0.4000 mg | SUBLINGUAL_TABLET | SUBLINGUAL | 3 refills | Status: DC | PRN

## 2021-02-26 NOTE — Telephone Encounter (Signed)
Pt c/o Shortness Of Breath: STAT if SOB developed within the last 24 hours or pt is noticeably SOB on the phone  1. Are you currently SOB (can you hear that pt is SOB on the phone)? No  2. How long have you been experiencing SOB?  5 days  3. Are you SOB when sitting or when up moving around? both  4. Are you currently experiencing any other symptoms? No

## 2021-02-26 NOTE — Telephone Encounter (Signed)
Left message for patient to call back  

## 2021-02-26 NOTE — Telephone Encounter (Signed)
Spoke with pt who complains of intermittent SOB with or without exertion x 1 week.  He denies orthopnea or edema.  He does mention that he has also had some chest discomfort under his" breast bone" (3-4 on pain scale) after walking that does resolve with rest.  He has not tried taking any nitroglycerin as it Rx expired. He reports compliance with his other medications. Pt denies active CP or SOB at this time.  Appointment scheduled with Dr Radford Pax for 02/27/2021 at 1:20pm. Nitroglycerin refilled and advised pt to pick up this Rx tonight if at all possible due to his history.  Reviewed ED precautions.  Pt verbalizes understanding and agrees with current plan.

## 2021-02-27 ENCOUNTER — Ambulatory Visit: Payer: Medicare HMO | Admitting: Cardiology

## 2021-02-27 ENCOUNTER — Encounter: Payer: Self-pay | Admitting: Cardiology

## 2021-02-27 ENCOUNTER — Telehealth: Payer: Self-pay | Admitting: Cardiology

## 2021-02-27 ENCOUNTER — Other Ambulatory Visit: Payer: Self-pay

## 2021-02-27 VITALS — BP 132/78 | HR 60 | Ht 71.0 in | Wt 245.8 lb

## 2021-02-27 DIAGNOSIS — R072 Precordial pain: Secondary | ICD-10-CM | POA: Diagnosis not present

## 2021-02-27 DIAGNOSIS — E7800 Pure hypercholesterolemia, unspecified: Secondary | ICD-10-CM

## 2021-02-27 DIAGNOSIS — I2510 Atherosclerotic heart disease of native coronary artery without angina pectoris: Secondary | ICD-10-CM

## 2021-02-27 DIAGNOSIS — I10 Essential (primary) hypertension: Secondary | ICD-10-CM | POA: Diagnosis not present

## 2021-02-27 MED ORDER — CARVEDILOL 6.25 MG PO TABS: 6.2500 mg | ORAL_TABLET | Freq: Two times a day (BID) | ORAL | 1 refills | Status: DC

## 2021-02-27 MED ORDER — LISINOPRIL 20 MG PO TABS: 1.0000 | ORAL_TABLET | Freq: Every day | ORAL | 3 refills | Status: DC

## 2021-02-27 MED ORDER — CLOPIDOGREL BISULFATE 75 MG PO TABS: ORAL_TABLET | ORAL | 3 refills | Status: DC

## 2021-02-27 MED ORDER — DIAZEPAM 5 MG PO TABS: 5.0000 mg | ORAL_TABLET | Freq: Once | ORAL | 0 refills | Status: AC

## 2021-02-27 MED ORDER — EZETIMIBE-SIMVASTATIN 10-10 MG PO TABS: 1.0000 | ORAL_TABLET | Freq: Every day | ORAL | 3 refills | Status: DC

## 2021-02-27 NOTE — Progress Notes (Signed)
Cardiology Office Note:    Date:  02/27/2021   ID:  Prescilla Cruz, DOB 1951/07/15, MRN [id number]  PCP:  Colon Branch, MD  Cardiologist:  Fransico Him, MD    Referring MD: Colon Branch, MD   Chief Complaint  Patient presents with  . Coronary Artery Disease  . Hypertension  . Hyperlipidemia    History of Present Illness:    Daniel Cruz is a 70 y.o. male with a hx of ASCAD with NSTEMI 2001 with PCI of the mid and distal RCA with 50-60% prox LAD and subsequent PCI of 80% LAD and right PDA 01/2009, diabetes, hypertension, hyperlipidemia, obstructive sleep apnea and GERD.    In 08/2019 he started having DOE with walking and stress myoview showed scarring in the distal LAD territory that was larger than prior studies.  Cardiac cath was done showing patent stents in thep/mRCA with mild to moderate restenosis that was not flow-limiting, CTO of themLAD with collaterals, along with new severe stenosis in the intermediate branch. Successful PCI/DES x1 was placed. Plan was for DAPT with ASA and Plavix for at least 6 months. Noted if he continued to have dyspnea, could be considered for CTO PCI of the LAD. Discussed about higher intensity statin and having Jardiance added as outpatient. Seen by Truitt Merle, NP 10/2019 and feeling better.  He is here today for followup.  He tells me that about 7 days ago he started walking outside and started having chest discomfort that he thought felt like indigestion.  He then had some DOE the other day which was new for him.  He has tried to walk 3 more times and the same symptoms reoccurred.  He says that it feels like if he just belches it would go away.  The chest discomfort resolves with rest.  He says that the symptoms that he had with his MI in 2001 were different.  His symptoms are similar to when he had his GB issues.  He has had a lot of problems with his stomach recently but what Is concerning it it occurs with rest. He also has been having DOE  that he had with his prior anginal events.   There is no radiation of the discomfort and no associated sx of diaphoresis or nausea. He denies any PND, orthopnea, LE edema, dizziness, palpitations or syncope. He is compliant with his meds and is tolerating meds with no SE.    Past Medical History:  Diagnosis Date  . Anemia, iron deficiency    hx  . CAD (coronary artery disease)     NSTEMI with PCI of the mid and distal RCA with 50-60% LAD 09/2000, s/p cath 01/2009 with patent RCA stent and 90% LAD s/p PCI of LAD and right PDA  . Cholelithiasis   . DIABETES MELLITUS, TYPE II 02/26/2007  . Fatty liver 02/2009   per u/s  . GERD (gastroesophageal reflux disease)   . HTN (hypertension)   . Myocardial infarction ([place])    2001  . OSA (obstructive sleep apnea)    can't tol. a CPAP  . Plantar fasciitis of left foot   . PVC (premature ventricular contraction) 06/06/2017  . Sleep apnea   . UTI (lower urinary tract infection)    in the 80s and 11/09    Past Surgical History:  Procedure Laterality Date  . CARDIAC CATHETERIZATION  10/2008   stents  . CHOLECYSTECTOMY N/A 07/16/2016   Procedure: LAPAROSCOPIC CHOLECYSTECTOMY WITH INTRAOPERATIVE CHOLANGIOGRAM;  Surgeon: [name],  MD;  Location: [facility];  Service: General;  Laterality: N/A;  . CORONARY STENT INTERVENTION N/A 09/20/2019   Procedure: CORONARY STENT INTERVENTION;  Surgeon: Burnell Blanks, MD;  Location: [facility];  Service: Cardiovascular;  Laterality: N/A;  . LEFT HEART CATH AND CORONARY ANGIOGRAPHY N/A 09/20/2019   Procedure: LEFT HEART CATH AND CORONARY ANGIOGRAPHY;  Surgeon: Burnell Blanks, MD;  Location: [facility];  Service: Cardiovascular;  Laterality: N/A;  . SEPTOPLASTY     w/ bilateral inferior turbinate reductions  . TONSILLECTOMY      Current Medications: Current Meds  Medication Sig  . aspirin EC 81 MG tablet Take 81 mg by mouth at bedtime.   . blood glucose meter kit and supplies  Dispense based on patient and insurance preference. Check blood sugar once daily  . carvedilol (COREG) 6.25 MG tablet Take 1 tablet (6.25 mg total) by mouth 2 (two) times daily with a meal.  . cetirizine (ZYRTEC) 10 MG tablet Take 10 mg by mouth daily.  . clopidogrel (PLAVIX) 75 MG tablet TAKE 1 TABLET EVERY DAY WITH BREAKFAST  . dapagliflozin propanediol (FARXIGA) 10 MG TABS tablet Take 1 tablet (10 mg total) by mouth daily before breakfast.  . ezetimibe-simvastatin (VYTORIN) 10-10 MG tablet Take 1 tablet by mouth daily.  . ferrous fumarate (HEMOCYTE - 106 MG FE) 325 (106 Fe) MG TABS tablet Take 1 tablet (106 mg of iron total) by mouth 2 (two) times daily before a meal.  . glipiZIDE (GLUCOTROL XL) 10 MG 24 hr tablet Take 1 tablet (10 mg total) by mouth daily with breakfast.  . guaiFENesin (MUCINEX) 600 MG 12 hr tablet Take by mouth 2 (two) times daily.  Marland Kitchen linagliptin (TRADJENTA) 5 MG TABS tablet Take 1 tablet (5 mg total) by mouth daily.  Marland Kitchen lisinopril (ZESTRIL) 20 MG tablet TAKE 1 TABLET BY MOUTH EVERY DAY  . Multiple Vitamin (MULTIVITAMIN) tablet Take 1 tablet by mouth daily.  . nitroGLYCERIN (NITROSTAT) 0.4 MG SL tablet Place 1 tablet (0.4 mg total) under the tongue every 5 (five) minutes x 3 doses as needed for chest pain.  Glory Rosebush ULTRA test strip CHECK BLOOD SUGAR 1 DAILY  . pantoprazole (PROTONIX) 40 MG tablet Take 1 tablet (40 mg total) by mouth 2 (two) times daily before a meal.  . pioglitazone-metformin (ACTOPLUS MET) 15-850 MG tablet Take 1 tablet by mouth 2 (two) times daily with a meal.  . sulfamethoxazole-trimethoprim (BACTRIM DS) 800-160 MG tablet Take 1 tablet by mouth 2 (two) times daily.     Allergies:   Januvia [sitagliptin]   Social History   Socioeconomic History  . Marital status: Married    Spouse name: Not on file  . Number of children: 2  . Years of education: Not on file  . Highest education level: Not on file  Occupational History  . 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 Vice Engineer, production at [employer]  Tobacco Use  . Smoking status: Former Smoker    Packs/day: 1.50    Years: 16.00    Pack years: 24.00    Quit date: 07/08/2000    Years since quitting: 20.6  . Smokeless tobacco: Never Used  . Tobacco comment: 2001  Vaping Use  . Vaping Use: Never used  Substance and Sexual Activity  . Alcohol use: Yes    Comment: socially  . Drug use: No  . Sexual activity: Yes  Other Topics Concern  . Not on file  Social History  Narrative   Lives in [place], [place]   Lives w/ wife, 2 children, 2 g-kids   Retired April 2017          Social Determinants of Health   Financial Resource Strain: [place]   . Difficulty of Paying Living Expenses: Not hard at all  Food Insecurity: No Food Insecurity  . Worried About Charity fundraiser in the Last Year: Never true  . Ran Out of Food in the Last Year: Never true  Transportation Needs: No Transportation Needs  . Lack of Transportation (Medical): No  . Lack of Transportation (Non-Medical): No  Physical Activity: Insufficiently Active  . Days of Exercise per Week: 3 days  . Minutes of Exercise per Session: 30 min  Stress: No Stress Concern Present  . Feeling of Stress : Not at all  Social Connections: Moderately Isolated  . Frequency of Communication with Friends and Family: Three times a week  . Frequency of Social Gatherings with Friends and Family: More than three times a week  . Attends Religious Services: Never  . Active Member of Clubs or Organizations: No  . Attends Archivist Meetings: Never  . Marital Status: Married     Family History: The patient's family history includes COPD in his mother; Coronary artery disease in his father; Emphysema in his mother; Stroke in his father. There is no history of Cancer, Diabetes, Colon cancer, Esophageal cancer, Rectal cancer, Stomach cancer, or Prostate cancer.  ROS:   Please see the history of present illness.     Review of Systems  Skin: Negative for unusual hair distribution.    All other systems reviewed and negative.   EKGs/Labs/Other Studies Reviewed:    The following studies were reviewed today:  09/2019 Cardiac Cath: Conclusion    Prox RCA lesion is 40% stenosed.  RPDA lesion is 30% stenosed.  Ramus lesion is 80% stenosed.  Mid Cx to Dist Cx lesion is 30% stenosed with 30% stenosed side branch in 2nd Mrg.  Mid LAD-1 lesion is 100% stenosed.  Mid LAD-2 lesion is 100% stenosed.  A drug-eluting stent was successfully placed using a STENT RESOLUTE ONYX [id number].  Post intervention, there is a 0% residual stenosis.  The left ventricular systolic function is normal.  LV end diastolic pressure is normal.  The left ventricular ejection fraction is 55-65% by visual estimate.  There is no mitral valve regurgitation.   1. Patent stents proximal and mid RCA with mild to moderate restenosis which does not appear to be flow limiting.  2. Chronic occlusion of the mid LAD. The mid and distal LAD fills from right to left collaterals.  3. Mild non-obstructive disease in the Circumflex artery 4. Severe stenosis in the moderate caliber intermediate branch.  5. Preserved LV systolic function 6. Successful PTCA/DES x 1 intermediate branch  Recommendations: Continue ASA and Plavix for at least 6 months. If he has continued dyspnea, could consider CTO PCI of the LAD. If this is the case, he would need to have his cath films reviewed by the CTO team. Same day PCI discharge.    PAP compliance downlaod   EKG:  EKG is ordered today and showed NSR with short PR interval vs. Ectopic atrial rhythm  Recent Labs: 06/20/2020: ALT 15; TSH 1.50 12/13/2020: BUN 14; Creatinine, Ser 0.92; Hemoglobin 13.2; Platelets 189.0; Potassium 4.1; Sodium 140   Recent Lipid Panel    Component Value Date/Time   CHOL 117 12/13/2020 1011   TRIG 90.0 12/13/2020 1011  TRIG 106 09/15/2006 0947   HDL 42.30  12/13/2020 1011   CHOLHDL 3 12/13/2020 1011   VLDL 18.0 12/13/2020 1011   LDLCALC 57 12/13/2020 1011    Physical Exam:    VS:  BP 132/78   Pulse 60   Ht _0  (1.803 m)   Wt 245 lb 12.8 oz (111.5 kg)   BMI 34.28 kg/m     Wt Readings from Last 3 Encounters:  02/27/21 245 lb 12.8 oz (111.5 kg)  12/13/20 245 lb (111.1 kg)  06/20/20 253 lb 6 oz (114.9 kg)     GEN: Well nourished, well developed in no acute distress HEENT: Normal NECK: No JVD; No carotid bruits LYMPHATICS: No lymphadenopathy CARDIAC:RRR, no murmurs, rubs, gallops RESPIRATORY:  Clear to auscultation without rales, wheezing or rhonchi  ABDOMEN: Soft, non-tender, non-distended MUSCULOSKELETAL:  No edema; No deformity  SKIN: Warm and dry NEUROLOGIC:  Alert and oriented x 3 PSYCHIATRIC:  Normal affect    ASSESSMENT:    1. Coronary artery disease involving native coronary artery of native heart without angina pectoris   2. Primary hypertension   3. Pure hypercholesterolemia    PLAN:    In order of problems listed above:  1.  ASCAD -s/p NSTEMI 2001 with PCI of the mid and distal RCA with 50-60% prox LAD and subsequent PCI of 80% LAD and right PDA. -cath done 09/2019 showed patent stents in thep/mRCA with mild to moderate restenosis that was not flow-limiting, CTO of themLAD with collaterals, along with new severe stenosis in the intermediate branch s/p PCI/DES x1 was placed.  -Plan was for DAPT with ASA and Plavix for at least 6 months with consideration of CTO PCI of the LAD if -he recently has started having DOE which is what he had prior with PCI in the past.  He also is now having exertional chest discomfort that feels like indigestion but he has not had this with his MI in the past -I have recommended proceeding with Leane Call -Shared Decision Making/Informed Consent The risks [chest pain, shortness of breath, cardiac arrhythmias, dizziness, blood pressure fluctuations, myocardial infarction,  stroke/transient ischemic attack, nausea, vomiting, allergic reaction, radiation exposure, metallic taste sensation and life-threatening complications (estimated to be 1 in 10,000)], benefits (risk stratification, diagnosing coronary artery disease, treatment guidance) and alternatives of a nuclear stress test were discussed in detail with Mr. Haymond and he agrees to proceed. -Continue ASA 72m daily, Plavix 736mdaily ,statin and Carvedilol 6.25106mID and Farxiga  2.  HTN -BP is well controlled on exam today -Continue prescription drug management with Lisinopril 45m27mily and carvedilol 6.25mg22m -I have personally reviewed and interpreted outside labs performed by patient's PCP which showed SCr 0.92, K+ 4.1 in March 2022  3.  HLD -LDL goal is < 70 -I have personally reviewed and interpreted outside Labs performed by patient's PCP which showed LDL 57, HDL 42 and TAGs 90 in March 2022 -Continue prescription drug management with Vytorin 10-10mg daily   Medication Adjustments/Labs and Tests Ordered: Current medicines are reviewed at length with the patient today.  Concerns regarding medicines are outlined above.  Orders Placed This Encounter  Procedures  . EKG 12-Lead   No orders of the defined types were placed in this encounter.   Signed, TraciFransico Him 02/27/2021 1:47 PM    [facility]

## 2021-02-27 NOTE — Telephone Encounter (Signed)
Follow up:     Patient returning a call back from yesterday.

## 2021-02-27 NOTE — Addendum Note (Signed)
Addended by: Antonieta Iba on: 02/27/2021 01:57 PM   Modules accepted: Orders

## 2021-02-27 NOTE — Telephone Encounter (Signed)
Spoke with the patient and advised him that I had attempted to call him yesterday while he was on the phone with a triage nurse. Patient is currently on the way to the office for his visit.

## 2021-02-27 NOTE — Addendum Note (Signed)
Addended by: Antonieta Iba on: 02/27/2021 02:13 PM   Modules accepted: Orders

## 2021-02-27 NOTE — Patient Instructions (Signed)
Medication Instructions:  Your physician recommends that you continue on your current medications as directed. Please refer to the Current Medication list given to you today.  *If you need a refill on your cardiac medications before your next appointment, please call your pharmacy*  Testing/Procedures: Your physician has requested that you have a lexiscan myoview. For further information please visit [email or web address]. Please follow instruction sheet, as given.  Follow-Up: At [facility], you and your health needs are our priority.  As part of our continuing mission to provide you with exceptional heart care, we have created designated Provider Care Teams.  These Care Teams include your primary Cardiologist (physician) and Advanced Practice Providers (APPs -  Physician Assistants and Nurse Practitioners) who all work together to provide you with the care you need, when you need it.  Your next appointment:   1 year(s)  The format for your next appointment:   In Person  Provider:   You may see Fransico Him, MD or one of the following Advanced Practice Providers on your designated Care Team:    Melina Copa, PA-C  Ermalinda Barrios, PA-C

## 2021-02-28 NOTE — Addendum Note (Signed)
Addended by: Sueanne Margarita on: 02/28/2021 08:25 AM   Modules accepted: Orders

## 2021-03-07 ENCOUNTER — Telehealth (HOSPITAL_COMMUNITY): Payer: Self-pay | Admitting: *Deleted

## 2021-03-07 NOTE — Telephone Encounter (Signed)
Left message on voicemail per DPR in reference to upcoming appointment scheduled on 03/13/21 with detailed instructions given per Myocardial Perfusion Study Information Sheet for the test. LM to arrive 15 minutes early, and that it is imperative to arrive on time for appointment to keep from having the test rescheduled. If you need to cancel or reschedule your appointment, please call the office within 24 hours of your appointment. Failure to do so may result in a cancellation of your appointment, and a $50 no show fee. Phone number given for call back for any questions. Kirstie Peri

## 2021-03-13 ENCOUNTER — Other Ambulatory Visit: Payer: Self-pay

## 2021-03-13 ENCOUNTER — Ambulatory Visit (HOSPITAL_COMMUNITY): Payer: Medicare HMO | Attending: Cardiology

## 2021-03-13 DIAGNOSIS — R072 Precordial pain: Secondary | ICD-10-CM | POA: Diagnosis not present

## 2021-03-13 LAB — MYOCARDIAL PERFUSION IMAGING
LV dias vol: 96 mL (ref 62–150)
LV sys vol: 48 mL
Peak HR: 82 {beats}/min
Rest HR: 56 {beats}/min
SDS: 7
SRS: 3
SSS: 11
TID: 1.05

## 2021-03-13 MED ORDER — REGADENOSON 0.4 MG/5ML IV SOLN
0.4000 mg | Freq: Once | INTRAVENOUS | Status: AC
  Administered 2021-03-13: 0.4 mg via INTRAVENOUS

## 2021-03-13 MED ORDER — TECHNETIUM TC 99M TETROFOSMIN IV KIT
10.8000 | PACK | Freq: Once | INTRAVENOUS | Status: AC | PRN
Start: 2021-03-13 — End: 2021-03-13
  Administered 2021-03-13: 10.8 via INTRAVENOUS
  Filled 2021-03-13: qty 11

## 2021-03-13 MED ORDER — TECHNETIUM TC 99M TETROFOSMIN IV KIT
30.7000 | PACK | Freq: Once | INTRAVENOUS | Status: AC | PRN
Start: 2021-03-13 — End: 2021-03-13
  Administered 2021-03-13: 30.7 via INTRAVENOUS
  Filled 2021-03-13: qty 31

## 2021-03-15 ENCOUNTER — Other Ambulatory Visit: Payer: Self-pay | Admitting: Internal Medicine

## 2021-03-15 ENCOUNTER — Other Ambulatory Visit: Payer: Self-pay

## 2021-03-15 ENCOUNTER — Encounter: Payer: Self-pay | Admitting: Internal Medicine

## 2021-03-15 ENCOUNTER — Ambulatory Visit (INDEPENDENT_AMBULATORY_CARE_PROVIDER_SITE_OTHER): Payer: Medicare HMO | Admitting: Internal Medicine

## 2021-03-15 VITALS — BP 132/80 | HR 61 | Temp 98.1°F | Resp 16 | Ht 71.0 in | Wt 252.1 lb

## 2021-03-15 DIAGNOSIS — R1013 Epigastric pain: Secondary | ICD-10-CM | POA: Diagnosis not present

## 2021-03-15 DIAGNOSIS — E119 Type 2 diabetes mellitus without complications: Secondary | ICD-10-CM | POA: Diagnosis not present

## 2021-03-15 DIAGNOSIS — N419 Inflammatory disease of prostate, unspecified: Secondary | ICD-10-CM | POA: Diagnosis not present

## 2021-03-15 DIAGNOSIS — I2510 Atherosclerotic heart disease of native coronary artery without angina pectoris: Secondary | ICD-10-CM

## 2021-03-15 DIAGNOSIS — K219 Gastro-esophageal reflux disease without esophagitis: Secondary | ICD-10-CM

## 2021-03-15 DIAGNOSIS — E118 Type 2 diabetes mellitus with unspecified complications: Secondary | ICD-10-CM | POA: Diagnosis not present

## 2021-03-15 DIAGNOSIS — Z0100 Encounter for examination of eyes and vision without abnormal findings: Secondary | ICD-10-CM

## 2021-03-15 LAB — PSA: PSA: 1.16 ng/mL (ref 0.10–4.00)

## 2021-03-15 LAB — HEMOGLOBIN A1C: Hgb A1c MFr Bld: 6.5 % (ref 4.6–6.5)

## 2021-03-15 NOTE — Assessment & Plan Note (Signed)
DM: On Farxiga, Glucotrol, Tradjenta, Actos plus met.  Was trying to do better with lifestyle but developed DOE.  Has gained weight.  Check A1c.  Further advise and RFs w/ results Prostatitis: See last visit, s/p ABX, no symptoms now, check PSA CAD: Saw cardiology 02/27/2021, C/O DOE after started a exercise program, was recommended a Lexiscan Myoview: See results, cardiology comments pending. GERD, history of anemia, dyspepsia/bloated: Currently on iron, we agreed to continue with it, recheck labs on RTC and consider stop iron.. On PPIs, no heartburn per se but he reports bloating, could be gastroparesis.  We agreed on observation for now and consider GI referral. RTC 06-2021 CPX.

## 2021-03-15 NOTE — Progress Notes (Signed)
Subjective:    Patient ID: Daniel Cruz, male    DOB: 1951/01/13, 70 y.o.   MRN: [id number]  DOS:  03/15/2021 Type of visit - description: Follow-up  Today with talk about prostatitis, DOE on stomach problems.  He went to cardiology due to [place] in the context of trying to get back on a exercise routine.  Work-up was done and reviewed.  Was recently Dx with prostatitis, finish antibiotics now he is completely asymptomatic  Complaining of feeling bloated, this has been gradual but persistent issue. Denies nausea vomiting No blood in the stools No heartburn per se. No dysphagia or odynophagia.  Wt Readings from Last 3 Encounters:  03/15/21 252 lb 2 oz (114.4 kg)  03/13/21 245 lb (111.1 kg)  02/27/21 245 lb 12.8 oz (111.5 kg)     Review of Systems See above   Past Medical History:  Diagnosis Date   Anemia, iron deficiency    hx   CAD (coronary artery disease)     NSTEMI with PCI of the mid and distal RCA with 50-60% LAD 09/2000, s/p cath 01/2009 with patent RCA stent and 90% LAD s/p PCI of LAD and right PDA   Cholelithiasis    DIABETES MELLITUS, TYPE II 02/26/2007   Fatty liver 02/2009   per u/s   GERD (gastroesophageal reflux disease)    HTN (hypertension)    Myocardial infarction ([place])    2001   OSA (obstructive sleep apnea)    can't tol. a CPAP   Plantar fasciitis of left foot    PVC (premature ventricular contraction) 06/06/2017   Sleep apnea    UTI (lower urinary tract infection)    in the 80s and 11/09    Past Surgical History:  Procedure Laterality Date   CARDIAC CATHETERIZATION  10/2008   stents   CHOLECYSTECTOMY N/A 07/16/2016   Procedure: LAPAROSCOPIC CHOLECYSTECTOMY WITH INTRAOPERATIVE CHOLANGIOGRAM;  Surgeon: [name], MD;  Location: [facility];  Service: General;  Laterality: N/A;   CORONARY STENT INTERVENTION N/A 09/20/2019   Procedure: CORONARY STENT INTERVENTION;  Surgeon: Burnell Blanks, MD;  Location: [facility];  Service:  Cardiovascular;  Laterality: N/A;   LEFT HEART CATH AND CORONARY ANGIOGRAPHY N/A 09/20/2019   Procedure: LEFT HEART CATH AND CORONARY ANGIOGRAPHY;  Surgeon: Burnell Blanks, MD;  Location: [facility];  Service: Cardiovascular;  Laterality: N/A;   SEPTOPLASTY     w/ bilateral inferior turbinate reductions   TONSILLECTOMY      Allergies as of 03/15/2021       Reactions   Januvia [sitagliptin] Nausea Only, Other (See Comments)   Back Pain        Medication List        Accurate as of March 15, 2021  4:05 PM. If you have any questions, ask your nurse or doctor.          STOP taking these medications    sulfamethoxazole-trimethoprim 800-160 MG tablet Commonly known as: BACTRIM DS Stopped by: Kathlene November, MD       TAKE these medications    aspirin EC 81 MG tablet Take 81 mg by mouth at bedtime.   blood glucose meter kit and supplies Dispense based on patient and insurance preference. Check blood sugar once daily   carvedilol 6.25 MG tablet Commonly known as: COREG Take 1 tablet (6.25 mg total) by mouth 2 (two) times daily with a meal.   cetirizine 10 MG tablet Commonly known as: ZYRTEC Take 10 mg  by mouth daily.   clopidogrel 75 MG tablet Commonly known as: PLAVIX TAKE 1 TABLET EVERY DAY WITH BREAKFAST   dapagliflozin propanediol 10 MG Tabs tablet Commonly known as: Farxiga Take 1 tablet (10 mg total) by mouth daily before breakfast.   ezetimibe-simvastatin 10-10 MG tablet Commonly known as: VYTORIN Take 1 tablet by mouth daily.   ferrous fumarate 325 (106 Fe) MG Tabs tablet Commonly known as: HEMOCYTE - 106 mg FE Take 1 tablet (106 mg of iron total) by mouth 2 (two) times daily before a meal.   glipiZIDE 10 MG 24 hr tablet Commonly known as: GLUCOTROL XL Take 1 tablet (10 mg total) by mouth daily with breakfast.   guaiFENesin 600 MG 12 hr tablet Commonly known as: MUCINEX Take by mouth 2 (two) times daily.   linagliptin 5 MG Tabs  tablet Commonly known as: Tradjenta Take 1 tablet (5 mg total) by mouth daily.   lisinopril 20 MG tablet Commonly known as: ZESTRIL Take 1 tablet (20 mg total) by mouth daily.   multivitamin tablet Take 1 tablet by mouth daily.   nitroGLYCERIN 0.4 MG SL tablet Commonly known as: NITROSTAT Place 1 tablet (0.4 mg total) under the tongue every 5 (five) minutes x 3 doses as needed for chest pain.   OneTouch Ultra test strip Generic drug: glucose blood CHECK BLOOD SUGAR 1 DAILY   pantoprazole 40 MG tablet Commonly known as: PROTONIX Take 1 tablet (40 mg total) by mouth 2 (two) times daily before a meal.   pioglitazone-metformin 15-850 MG tablet Commonly known as: ACTOPLUS MET Take 1 tablet by mouth 2 (two) times daily with a meal.           Objective:   Physical Exam BP 132/80 (BP Location: Left Arm, Patient Position: Sitting, Cuff Size: Normal)   Pulse 61   Temp 98.1 F (36.7 C) (Oral)   Resp 16   Ht $R'5\' 11"'WC$  (1.803 m)   Wt 252 lb 2 oz (114.4 kg)   SpO2 97%   BMI 35.16 kg/m  General:   Well developed, NAD, BMI noted. HEENT:  Normocephalic . Face symmetric, atraumatic Lungs:  CTA B Normal respiratory effort, no intercostal retractions, no accessory muscle use. Heart: RRR,  no murmur.  Lower extremities: no pretibial edema bilaterally  Skin: Not pale. Not jaundice Neurologic:  alert & oriented X3.  Speech normal, gait appropriate for age and unassisted Psych--  Cognition and judgment appear intact.  Cooperative with normal attention span and concentration.  Behavior appropriate. No anxious or depressed appearing.      Assessment      Assessment DM DX 2008 HTN Hyperlipidemia CAD:  -MI 2001, syncope 2010 -07-2017: SOB, low risk stress test -Catheterization 09/2019: one stent, DAPT for 6 months OSA, CPAP intolerant ED GI: Mild chronic anemia, low iron stores.  Normal colonoscopy 2014, EGD 03/2019: H. pylori negative.   Saw GI 04-2017: no further w/u   GERD Gallbladder polyps, 2014, surgery 07-2016 Fatty liver 2010 Used to see  Dr Cena Benton DM: On Farxiga, Glucotrol, Tradjenta, Actos plus met.  Was trying to do better with lifestyle but developed DOE.  Has gained weight.  Check A1c.  Further advise and RFs w/ results Prostatitis: See last visit, s/p ABX, no symptoms now, check PSA CAD: Saw cardiology 02/27/2021, C/O DOE after started a exercise program, was recommended a Lexiscan Myoview: See results, cardiology comments pending. GERD, history of anemia, dyspepsia/bloated: Currently on iron, we agreed to continue with it, recheck  labs on RTC and consider stop iron.. On PPIs, no heartburn per se but he reports bloating, could be gastroparesis.  We agreed on observation for now and consider GI referral. RTC 06-2021 CPX.     This visit occurred during the SARS-CoV-2 public health emergency.  Safety protocols were in place, including screening questions prior to the visit, additional usage of staff PPE, and extensive cleaning of exam room while observing appropriate contact time as indicated for disinfecting solutions.

## 2021-03-15 NOTE — Patient Instructions (Addendum)
We have placed a referral back to [facility] for your diabetic eye exam- per our records your last was in 2020- it is important that you start doing these at least once yearly to protect your vision. Please expect a call in the next several days to schedule an appointment.    GO TO THE LAB : Get the blood work     [place], [place] Come back for a physical exam by September

## 2021-03-16 ENCOUNTER — Telehealth: Payer: Self-pay

## 2021-03-16 DIAGNOSIS — K219 Gastro-esophageal reflux disease without esophagitis: Secondary | ICD-10-CM

## 2021-03-16 MED ORDER — ISOSORBIDE MONONITRATE ER 30 MG PO TB24: 30.0000 mg | ORAL_TABLET | Freq: Every day | ORAL | 3 refills | Status: DC

## 2021-03-16 NOTE — Telephone Encounter (Signed)
-----   Message from Sueanne Margarita, MD sent at 03/16/2021 10:44 AM EDT ----- Add imdur 30mg  daily and have him start walking again - if symptoms reoccur he needs to call ----- Message ----- From: Antonieta Iba, RN Sent: 03/15/2021   4:59 PM EDT To: Sueanne Margarita, MD  The patient has been notified of the result and verbalized understanding.  All questions (if any) were answered. Antonieta Iba, RN 03/15/2021 4:58 PM   Patient states that he has not had any further chest discomfort, however he was previoulsy only having chest discomfort when he was going for walks which he has not been doing based on advisement.

## 2021-03-16 NOTE — Telephone Encounter (Signed)
Spoke with the patient and advised him on recommendations from Dr. Radford Pax. Rx has been sent in for Imdur 30 mg daily. He will call back if symptoms reoccur.  Patient would also like to know a referral to GI should still be considered or not.

## 2021-03-16 NOTE — Telephone Encounter (Signed)
Patient states that he has seen Dr. Ardis Hughs in the past. Referral has been placed.

## 2021-03-19 ENCOUNTER — Other Ambulatory Visit: Payer: Self-pay | Admitting: Internal Medicine

## 2021-03-19 MED ORDER — FERROUS FUMARATE 325 (106 FE) MG PO TABS: 1.0000 | ORAL_TABLET | Freq: Two times a day (BID) | ORAL | 1 refills | Status: DC

## 2021-03-19 MED ORDER — DAPAGLIFLOZIN PROPANEDIOL 10 MG PO TABS: 10.0000 mg | ORAL_TABLET | Freq: Every day | ORAL | 1 refills | Status: DC

## 2021-03-19 NOTE — Addendum Note (Signed)
Addended byDamita Dunnings D on: 03/19/2021 07:42 AM   Modules accepted: Orders

## 2021-03-22 ENCOUNTER — Ambulatory Visit: Payer: Medicare HMO | Admitting: *Deleted

## 2021-03-22 ENCOUNTER — Ambulatory Visit: Payer: Medicare HMO

## 2021-03-30 ENCOUNTER — Other Ambulatory Visit: Payer: Self-pay | Admitting: Internal Medicine

## 2021-04-03 ENCOUNTER — Ambulatory Visit: Payer: Medicare HMO | Admitting: Family

## 2021-05-03 DIAGNOSIS — H2513 Age-related nuclear cataract, bilateral: Secondary | ICD-10-CM | POA: Diagnosis not present

## 2021-05-03 DIAGNOSIS — I10 Essential (primary) hypertension: Secondary | ICD-10-CM | POA: Diagnosis not present

## 2021-05-03 DIAGNOSIS — H5212 Myopia, left eye: Secondary | ICD-10-CM | POA: Diagnosis not present

## 2021-05-03 DIAGNOSIS — H524 Presbyopia: Secondary | ICD-10-CM | POA: Diagnosis not present

## 2021-05-03 DIAGNOSIS — H35033 Hypertensive retinopathy, bilateral: Secondary | ICD-10-CM | POA: Diagnosis not present

## 2021-05-03 DIAGNOSIS — H5201 Hypermetropia, right eye: Secondary | ICD-10-CM | POA: Diagnosis not present

## 2021-05-03 DIAGNOSIS — H52223 Regular astigmatism, bilateral: Secondary | ICD-10-CM | POA: Diagnosis not present

## 2021-05-03 LAB — HM DIABETES EYE EXAM

## 2021-05-11 ENCOUNTER — Encounter: Payer: Self-pay | Admitting: Internal Medicine

## 2021-06-20 ENCOUNTER — Ambulatory Visit (INDEPENDENT_AMBULATORY_CARE_PROVIDER_SITE_OTHER): Payer: Medicare HMO | Admitting: Internal Medicine

## 2021-06-20 ENCOUNTER — Encounter: Payer: Self-pay | Admitting: Internal Medicine

## 2021-06-20 ENCOUNTER — Other Ambulatory Visit: Payer: Self-pay

## 2021-06-20 VITALS — BP 126/76 | HR 63 | Temp 98.4°F | Resp 16 | Ht 71.0 in | Wt 249.5 lb

## 2021-06-20 DIAGNOSIS — I10 Essential (primary) hypertension: Secondary | ICD-10-CM

## 2021-06-20 DIAGNOSIS — I2510 Atherosclerotic heart disease of native coronary artery without angina pectoris: Secondary | ICD-10-CM

## 2021-06-20 DIAGNOSIS — D649 Anemia, unspecified: Secondary | ICD-10-CM | POA: Diagnosis not present

## 2021-06-20 DIAGNOSIS — E118 Type 2 diabetes mellitus with unspecified complications: Secondary | ICD-10-CM

## 2021-06-20 DIAGNOSIS — Z0001 Encounter for general adult medical examination with abnormal findings: Secondary | ICD-10-CM

## 2021-06-20 DIAGNOSIS — E7800 Pure hypercholesterolemia, unspecified: Secondary | ICD-10-CM | POA: Diagnosis not present

## 2021-06-20 DIAGNOSIS — Z0000 Encounter for general adult medical examination without abnormal findings: Secondary | ICD-10-CM | POA: Diagnosis not present

## 2021-06-20 LAB — FERRITIN: Ferritin: 30.1 ng/mL (ref 22.0–322.0)

## 2021-06-20 LAB — CBC WITH DIFFERENTIAL/PLATELET
Basophils Absolute: 0 10*3/uL (ref 0.0–0.1)
Basophils Relative: 0.5 % (ref 0.0–3.0)
Eosinophils Absolute: 0.1 10*3/uL (ref 0.0–0.7)
Eosinophils Relative: 2.5 % (ref 0.0–5.0)
HCT: 40.5 % (ref 39.0–52.0)
Hemoglobin: 13.5 g/dL (ref 13.0–17.0)
Lymphocytes Relative: 21.6 % (ref 12.0–46.0)
Lymphs Abs: 1.1 10*3/uL (ref 0.7–4.0)
MCHC: 33.3 g/dL (ref 30.0–36.0)
MCV: 104.5 fl — ABNORMAL HIGH (ref 78.0–100.0)
Monocytes Absolute: 0.5 10*3/uL (ref 0.1–1.0)
Monocytes Relative: 10.7 % (ref 3.0–12.0)
Neutro Abs: 3.2 10*3/uL (ref 1.4–7.7)
Neutrophils Relative %: 64.7 % (ref 43.0–77.0)
Platelets: 163 10*3/uL (ref 150.0–400.0)
RBC: 3.88 Mil/uL — ABNORMAL LOW (ref 4.22–5.81)
RDW: 14.3 % (ref 11.5–15.5)
WBC: 4.9 10*3/uL (ref 4.0–10.5)

## 2021-06-20 LAB — IRON: Iron: 120 ug/dL (ref 42–165)

## 2021-06-20 LAB — COMPREHENSIVE METABOLIC PANEL
ALT: 18 U/L (ref 0–53)
AST: 22 U/L (ref 0–37)
Albumin: 3.9 g/dL (ref 3.5–5.2)
Alkaline Phosphatase: 57 U/L (ref 39–117)
BUN: 19 mg/dL (ref 6–23)
CO2: 27 mEq/L (ref 19–32)
Calcium: 9.1 mg/dL (ref 8.4–10.5)
Chloride: 104 mEq/L (ref 96–112)
Creatinine, Ser: 1.04 mg/dL (ref 0.40–1.50)
GFR: 71.71 mL/min (ref >60.00)
Glucose, Bld: 100 mg/dL — ABNORMAL HIGH (ref 70–99)
Potassium: 4.6 mEq/L (ref 3.5–5.1)
Sodium: 138 mEq/L (ref 135–145)
Total Bilirubin: 0.6 mg/dL (ref 0.2–1.2)
Total Protein: 6.2 g/dL (ref 6.0–8.3)

## 2021-06-20 LAB — HEMOGLOBIN A1C: Hgb A1c MFr Bld: 6.7 % — ABNORMAL HIGH (ref 4.6–6.5)

## 2021-06-20 NOTE — Patient Instructions (Addendum)
Take vitamin D 1000 units daily  GO TO THE LAB : Get the blood work     [place], [place] back for   a checkup in 6 months    "Living will", "[place] of attorney": Advanced care planning  (If you already have a living will or healthcare power of attorney, please bring the copy to be scanned in your chart.)  Advance care planning is a process that supports adults in  understanding and sharing their preferences regarding future medical care.   The patient's preferences are recorded in documents called Advance Directives.    Advanced directives are completed (and can be modified at any time) while the patient is in full mental capacity.   The documentation should be available at all times to the patient, the family and the healthcare providers.  Bring in a copy to be scanned in your chart is an excellent idea and is recommended   This legal documents direct treatment decision making and/or appoint a surrogate to make the decision if the patient is not capable to do so.    Advance directives can be documented in many types of formats,  documents have names such as:  Lliving will  Durable power of attorney for healthcare (healthcare proxy or healthcare power of attorney)  Combined directives  Physician orders for life-sustaining treatment    More information at:  [email or web address] discussed

## 2021-06-20 NOTE — Progress Notes (Signed)
Subjective:    Patient ID: Daniel Cruz, male    DOB: 06-21-1951, 70 y.o.   MRN: [id number]  DOS:  06/20/2021 Type of visit - description: cpx  In addition to CPX, we addressed all his chronic medical issues Since the last office visit, saw cardiology.  Note reviewed, stress test reviewed. At the time he had some discomfort, DOE and gastric issues. All sx resolved.   Review of Systems Denies chest pain no difficulty breathing. No nausea, vomiting, diarrhea.  No blood in the stools. No LUTS  Other than above, a 14 point review of systems is negative       Past Medical History:  Diagnosis Date   Anemia, iron deficiency    hx   CAD (coronary artery disease)     NSTEMI with PCI of the mid and distal RCA with 50-60% LAD 09/2000, s/p cath 01/2009 with patent RCA stent and 90% LAD s/p PCI of LAD and right PDA   Cholelithiasis    DIABETES MELLITUS, TYPE II 02/26/2007   Fatty liver 02/2009   per u/s   GERD (gastroesophageal reflux disease)    HTN (hypertension)    Myocardial infarction ([place])    2001   OSA (obstructive sleep apnea)    can't tol. a CPAP   Plantar fasciitis of left foot    PVC (premature ventricular contraction) 06/06/2017   Sleep apnea    UTI (lower urinary tract infection)    in the 80s and 11/09    Past Surgical History:  Procedure Laterality Date   CARDIAC CATHETERIZATION  10/2008   stents   CHOLECYSTECTOMY N/A 07/16/2016   Procedure: LAPAROSCOPIC CHOLECYSTECTOMY WITH INTRAOPERATIVE CHOLANGIOGRAM;  Surgeon: [name], MD;  Location: [facility];  Service: General;  Laterality: N/A;   CORONARY STENT INTERVENTION N/A 09/20/2019   Procedure: CORONARY STENT INTERVENTION;  Surgeon: Burnell Blanks, MD;  Location: [facility];  Service: Cardiovascular;  Laterality: N/A;   LEFT HEART CATH AND CORONARY ANGIOGRAPHY N/A 09/20/2019   Procedure: LEFT HEART CATH AND CORONARY ANGIOGRAPHY;  Surgeon: Burnell Blanks, MD;  Location: [facility];  Service: Cardiovascular;  Laterality: N/A;   SEPTOPLASTY     w/ bilateral inferior turbinate reductions   TONSILLECTOMY     Social History   Socioeconomic History   Marital status: Married    Spouse name: Not on file   Number of children: 2   Years of education: Not on file   Highest education level: Not on file  Occupational History   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, production at [employer]  Tobacco Use   Smoking status: Former    Packs/day: 1.50    Years: 16.00    Pack years: 24.00    Types: Cigarettes    Quit date: 07/08/2000    Years since quitting: 20.9   Smokeless tobacco: Never   Tobacco comments:    2001  Vaping Use   Vaping Use: Never used  Substance and Sexual Activity   Alcohol use: Yes    Comment: socially   Drug use: No   Sexual activity: Yes  Other Topics Concern   Not on file  Social History Narrative   Lives in [place], [place]   Lives w/ wife, 2 children, 2 g-kids   Retired April 2017          Social Determinants of Health   Financial Resource Strain: Not on file  Food Insecurity: Not on Pensions consultant  Needs: Not on file  Physical Activity: Not on file  Stress: Not on file  Social Connections: Not on file  Intimate Partner Violence: Not on file     Allergies as of 06/20/2021       Reactions   Januvia [sitagliptin] Nausea Only, Other (See Comments)   Back Pain        Medication List        Accurate as of June 20, 2021 11:59 PM. If you have any questions, ask your nurse or doctor.          STOP taking these medications    ferrous fumarate 325 (106 Fe) MG Tabs tablet Commonly known as: HEMOCYTE - 106 mg FE Stopped by: Kathlene November, MD       TAKE these medications    aspirin EC 81 MG tablet Take 81 mg by mouth at bedtime.   blood glucose meter kit and supplies Dispense based on patient and insurance preference. Check blood sugar once daily   carvedilol 6.25 MG  tablet Commonly known as: COREG Take 1 tablet (6.25 mg total) by mouth 2 (two) times daily with a meal.   cetirizine 10 MG tablet Commonly known as: ZYRTEC Take 10 mg by mouth daily.   cholecalciferol 25 MCG (1000 UNIT) tablet Commonly known as: VITAMIN D3 Take 1,000 Units by mouth daily.   clopidogrel 75 MG tablet Commonly known as: PLAVIX TAKE 1 TABLET EVERY DAY WITH BREAKFAST   dapagliflozin propanediol 10 MG Tabs tablet Commonly known as: Farxiga Take 1 tablet (10 mg total) by mouth daily before breakfast.   ezetimibe-simvastatin 10-10 MG tablet Commonly known as: VYTORIN Take 1 tablet by mouth daily.   glipiZIDE 10 MG 24 hr tablet Commonly known as: GLUCOTROL XL Take 1 tablet (10 mg total) by mouth daily with breakfast.   guaiFENesin 600 MG 12 hr tablet Commonly known as: MUCINEX Take by mouth 2 (two) times daily.   isosorbide mononitrate 30 MG 24 hr tablet Commonly known as: IMDUR Take 1 tablet (30 mg total) by mouth daily.   linagliptin 5 MG Tabs tablet Commonly known as: Tradjenta Take 1 tablet (5 mg total) by mouth daily.   lisinopril 20 MG tablet Commonly known as: ZESTRIL Take 1 tablet (20 mg total) by mouth daily.   multivitamin tablet Take 1 tablet by mouth daily.   nitroGLYCERIN 0.4 MG SL tablet Commonly known as: NITROSTAT Place 1 tablet (0.4 mg total) under the tongue every 5 (five) minutes x 3 doses as needed for chest pain.   OneTouch Ultra test strip Generic drug: glucose blood CHECK BLOOD SUGAR 1 DAILY   pantoprazole 40 MG tablet Commonly known as: PROTONIX Take 1 tablet (40 mg total) by mouth 2 (two) times daily before a meal.   pioglitazone-metformin 15-850 MG tablet Commonly known as: ACTOPLUS MET Take 1 tablet by mouth 2 (two) times daily with a meal.           Objective:   Physical Exam BP 126/76 (BP Location: Left Arm, Patient Position: Sitting, Cuff Size: Normal)   Pulse 63   Temp 98.4 F (36.9 C) (Oral)   Resp 16    Ht 5' 11" (1.803 m)   Wt 249 lb 8 oz (113.2 kg)   SpO2 97%   BMI 34.80 kg/m  General: Well developed, NAD, BMI noted Neck: No  thyromegaly  HEENT:  Normocephalic . Face symmetric, atraumatic Lungs:  CTA B Normal respiratory effort, no intercostal retractions, no accessory muscle use. Heart:  RRR,  no murmur.  Abdomen:  Not distended, soft, non-tender. No rebound or rigidity.   DM foot exam: No edema, good pulses, pinprick examination normal Skin: Exposed areas without rash. Not pale. Not jaundice Neurologic:  alert & oriented X3.  Speech normal, gait appropriate for age and unassisted Strength symmetric and appropriate for age.  Psych: Cognition and judgment appear intact.  Cooperative with normal attention span and concentration.  Behavior appropriate. No anxious or depressed appearing.     Assessment     Assessment DM DX 2008 HTN Hyperlipidemia CAD:  -MI 2001, syncope 2010 -07-2017: SOB, low risk stress test -Catheterization 09/2019: one stent, DAPT for 6 months OSA, CPAP intolerant ED GI/anemia: --Mild chronic anemia, low iron stores.  Diabetes, history of anemia --Normal colonoscopy 2014, EGD 03/2019: H. pylori negative.   --Saw GI 04-2017: no further w/u  --Transglutaminase IgA and IgM normal 08-2020 --GERD --Fatty liver 2010 Used to see  Dr Cena Benton Here for CPX DM: Continue Farxiga, glipizide, Tradjenta, Actos plus met, check A1c, feet exam negative.  Doing well with lifestyle HTN: BP well controlled, no ambulatory BPs, continue carvedilol, lisinopril, labs. CAD: Saw cardiology 02/27/2021, had exertional chest pain, pt states today he has SOB and GI issues, a myocardial perfusion scan was done shortly after, minimal ischemia, improved from last scan. Currently asx .  Iron deficiency: Long history of it, stopped iron supplements few months ago as they were causing some stomach discomfort.  Check CBC and iron levels.  No GI symptoms. High cholesterol:  Well-controlled on Vytorin. RTC 6 months.  In addition to CPX, we addressed all his chronic medical problems including diabetes, history of anemia.  Chart was reviewed in regards CAD.    This visit occurred during the SARS-CoV-2 public health emergency.  Safety protocols were in place, including screening questions prior to the visit, additional usage of staff PPE, and extensive cleaning of exam room while observing appropriate contact time as indicated for disinfecting solutions.

## 2021-06-21 ENCOUNTER — Encounter: Payer: Self-pay | Admitting: Internal Medicine

## 2021-06-21 NOTE — Assessment & Plan Note (Signed)
Here for CPX DM: Continue Farxiga, glipizide, Tradjenta, Actos plus met, check A1c, feet exam negative.  Doing well with lifestyle HTN: BP well controlled, no ambulatory BPs, continue carvedilol, lisinopril, labs. CAD: Saw cardiology 02/27/2021, had exertional chest pain, pt states today he has SOB and GI issues, a myocardial perfusion scan was done shortly after, minimal ischemia, improved from last scan. Currently asx .  Iron deficiency: Long history of it, stopped iron supplements few months ago as they were causing some stomach discomfort.  Check CBC and iron levels.  No GI symptoms. High cholesterol: Well-controlled on Vytorin. RTC 6 months.

## 2021-06-21 NOTE — Assessment & Plan Note (Signed)
--  Td 12-2015; pneumonia 2008 and 2018; PNM 13: 2015; zostavax 2014; s/p shingrex  - had C-19 vaccines including latest version - req flu shot q year --CCS: had Cscope 2003 negative ,again in 2014 (Dr Oletta Lamas).  Subsequently saw Dr. Ardis Hughs and he recommended next colonoscopy 2024. -Prostate cancer screening: PSA wnl  03/2021 -- Diet-exercise: active-walk-golf; still practices portion control most of the time  --labs:   CMP, CBC, iron, ferritin, A1c -Decrease height noted: Tscore -1.2 ( 06-2020), plan: Vitamin D supplements, stay active.  Consider repeat DEXA in few years.

## 2021-07-01 ENCOUNTER — Other Ambulatory Visit: Payer: Self-pay | Admitting: Cardiology

## 2021-07-09 ENCOUNTER — Other Ambulatory Visit: Payer: Self-pay | Admitting: Internal Medicine

## 2021-07-19 ENCOUNTER — Encounter: Payer: Self-pay | Admitting: Cardiology

## 2021-07-25 ENCOUNTER — Other Ambulatory Visit: Payer: Self-pay | Admitting: Internal Medicine

## 2021-08-01 ENCOUNTER — Telehealth: Payer: Self-pay | Admitting: Gastroenterology

## 2021-08-01 NOTE — Telephone Encounter (Signed)
Hi Dr. Lorenso Courier,  Please see notes below and let me know your thoughts.  Thank you.

## 2021-08-01 NOTE — Telephone Encounter (Signed)
Hi Dr. Ardis Hughs,  This patient is requesting a transfer of care from you to Dr. Lorenso Courier.  Patient has been recommended to see Dr. Lorenso Courier.  Please advise if it's OK to switch.  Thank you.

## 2021-08-02 ENCOUNTER — Encounter: Payer: Self-pay | Admitting: Internal Medicine

## 2021-08-28 ENCOUNTER — Encounter: Payer: Self-pay | Admitting: Internal Medicine

## 2021-08-28 ENCOUNTER — Ambulatory Visit (INDEPENDENT_AMBULATORY_CARE_PROVIDER_SITE_OTHER): Payer: Medicare HMO | Admitting: Internal Medicine

## 2021-08-28 VITALS — BP 136/74 | HR 80 | Ht 71.0 in | Wt 245.0 lb

## 2021-08-28 DIAGNOSIS — R1013 Epigastric pain: Secondary | ICD-10-CM

## 2021-08-28 DIAGNOSIS — R140 Abdominal distension (gaseous): Secondary | ICD-10-CM | POA: Diagnosis not present

## 2021-08-28 DIAGNOSIS — R109 Unspecified abdominal pain: Secondary | ICD-10-CM | POA: Diagnosis not present

## 2021-08-28 DIAGNOSIS — K5900 Constipation, unspecified: Secondary | ICD-10-CM | POA: Diagnosis not present

## 2021-08-28 NOTE — Patient Instructions (Addendum)
If you are age 72 or older, your body mass index should be between 23-30. Your Body mass index is 34.17 kg/m. If this is out of the aforementioned range listed, please consider follow up with your Primary Care Provider.  If you are age 55 or younger, your body mass index should be between 19-25. Your Body mass index is 34.17 kg/m. If this is out of the aformentioned range listed, please consider follow up with your Primary Care Provider.   Start drinking 8 cups of water daily.  Walk for 30 minutes daily.  Start a fiber supplement like Metamucil or Benefiber daily.  Start Miralax daily, you may titrate up to 3 time daily to achieve one bowel movement a day.   Take pantoprazole 30 minutes before meals.    The [facility] providers would like to encourage you to use [facility] to communicate with providers for non-urgent requests or questions.  Due to long hold times on the telephone, sending your provider a message by [facility] may be a faster and more efficient way to get a response.  Please allow 48 business hours for a response.  Please remember that this is for non-urgent requests.   It was a pleasure to see you today!  Thank you for trusting me with your gastrointestinal care!    Dayna Barker, MD

## 2021-08-28 NOTE — Progress Notes (Signed)
Chief Complaint: Dyspepsia and constipation  HPI : 70 year old male with history of cholelithiasis s/p cholecystectomy, CAD s/p PCI, DM, fatty liver, GERD, OSA presents with dyspepsia and constipation  He has had issues with indigestion for 30 years. His indigestion feels like abdominal bloating and epigastric abdominal discomfort. He also has early satiety. Over time his symptoms have gotten a little worse. He suffered a severe case of indigestion that felt like a heart attack. He went to the hospital and was told that it was not a heart attack. He had his gallblader removed, but he did not feel that his abdominal discomfort felt better afterwards. With Benadryl, he feels like the indigestion gets better. He has been on PPI BID but does not take it prior to meals consistently. He deliberately has been losing weight, about 25 lbs over the last year. Denies dysphagia, N&V.  He has a BM once every 3-4 days. He doesn't take any laxatives.  There was a period of a week when he noted that he was having one BM per day. Unclear why he had regular BMs at that time, but he felt like his indigestion improved when he was going more regularly. He has been trying his best to drink more water. Denies history of pancreas issues. Denies family history of GI history. Occasional alcohol use. Patient previously followed with Dr. Ardis Hughs but requested a provider change.   Past Medical History:  Diagnosis Date   Anemia, iron deficiency    hx   CAD (coronary artery disease)     NSTEMI with PCI of the mid and distal RCA with 50-60% LAD 09/2000, s/p cath 01/2009 with patent RCA stent and 90% LAD s/p PCI of LAD and right PDA   Cholelithiasis    DIABETES MELLITUS, TYPE II 02/26/2007   Fatty liver 02/2009   per u/s   GERD (gastroesophageal reflux disease)    HTN (hypertension)    Myocardial infarction ([facility])    2001   OSA (obstructive sleep apnea)    can't tol. a CPAP   Plantar fasciitis of left foot    PVC (premature  ventricular contraction) 06/06/2017   Sleep apnea    UTI (lower urinary tract infection)    in the 80s and 11/09     Past Surgical History:  Procedure Laterality Date   CARDIAC CATHETERIZATION  10/2008   stents   CHOLECYSTECTOMY N/A 07/16/2016   Procedure: LAPAROSCOPIC CHOLECYSTECTOMY WITH INTRAOPERATIVE CHOLANGIOGRAM;  Surgeon: [name], MD;  Location: [facility];  Service: General;  Laterality: N/A;   CORONARY STENT INTERVENTION N/A 09/20/2019   Procedure: CORONARY STENT INTERVENTION;  Surgeon: Burnell Blanks, MD;  Location: [facility];  Service: Cardiovascular;  Laterality: N/A;   LEFT HEART CATH AND CORONARY ANGIOGRAPHY N/A 09/20/2019   Procedure: LEFT HEART CATH AND CORONARY ANGIOGRAPHY;  Surgeon: Burnell Blanks, MD;  Location: [facility];  Service: Cardiovascular;  Laterality: N/A;   SEPTOPLASTY     w/ bilateral inferior turbinate reductions   TONSILLECTOMY     Family History  Problem Relation Age of Onset   Emphysema Mother        died   COPD Mother    Stroke Father    Coronary artery disease Father    Cancer Neg Hx        colon ,prostate   Diabetes Neg Hx    Colon cancer Neg Hx    Esophageal cancer Neg Hx    Rectal cancer Neg Hx  Stomach cancer Neg Hx    Prostate cancer Neg Hx    Social History   Tobacco Use   Smoking status: Former    Packs/day: 1.50    Years: 16.00    Pack years: 24.00    Types: Cigarettes    Quit date: 07/08/2000    Years since quitting: 21.1   Smokeless tobacco: Never   Tobacco comments:    2001  Vaping Use   Vaping Use: Never used  Substance Use Topics   Alcohol use: Yes    Comment: socially   Drug use: No   Current Outpatient Medications  Medication Sig Dispense Refill   aspirin EC 81 MG tablet Take 81 mg by mouth at bedtime.      blood glucose meter kit and supplies Dispense based on patient and insurance preference. Check blood sugar once daily 1 each 0   carvedilol (COREG) 6.25 MG tablet  Take 1 tablet (6.25 mg total) by mouth 2 (two) times daily with a meal. 180 tablet 1   cetirizine (ZYRTEC) 10 MG tablet Take 10 mg by mouth daily.     cholecalciferol (VITAMIN D3) 25 MCG (1000 UNIT) tablet Take 1,000 Units by mouth daily.     clopidogrel (PLAVIX) 75 MG tablet TAKE 1 TABLET EVERY DAY WITH BREAKFAST 90 tablet 3   dapagliflozin propanediol (FARXIGA) 10 MG TABS tablet Take 1 tablet (10 mg total) by mouth daily before breakfast. 90 tablet 1   ezetimibe-simvastatin (VYTORIN) 10-10 MG tablet Take 1 tablet by mouth daily. 90 tablet 3   glipiZIDE (GLUCOTROL XL) 10 MG 24 hr tablet TAKE 1 TABLET DAILY WITH   BREAKFAST 90 tablet 1   guaiFENesin (MUCINEX) 600 MG 12 hr tablet Take by mouth 2 (two) times daily.     isosorbide mononitrate (IMDUR) 30 MG 24 hr tablet Take 1 tablet (30 mg total) by mouth daily. 90 tablet 3   lisinopril (ZESTRIL) 20 MG tablet Take 1 tablet (20 mg total) by mouth daily. 90 tablet 3   Multiple Vitamin (MULTIVITAMIN) tablet Take 1 tablet by mouth daily.     nitroGLYCERIN (NITROSTAT) 0.4 MG SL tablet PLACE 1 TABLET UNDER THE TONGUE EVERY 5 (FIVE) MINUTES X 3 DOSES AS NEEDED FOR CHEST PAIN. 25 tablet 1   ONETOUCH ULTRA test strip CHECK BLOOD SUGAR 1 DAILY 100 strip 1   pantoprazole (PROTONIX) 40 MG tablet Take 1 tablet (40 mg total) by mouth 2 (two) times daily before a meal. 180 tablet 3   pioglitazone-metformin (ACTOPLUS MET) 15-850 MG tablet TAKE 1 TABLET TWICE DAILY  WITH MEALS 180 tablet 1   TRADJENTA 5 MG TABS tablet TAKE 1 TABLET DAILY 90 tablet 1   No current facility-administered medications for this visit.   Allergies  Allergen Reactions   Januvia [Sitagliptin] Nausea Only and Other (See Comments)    Back Pain     Review of Systems: All systems reviewed and negative except where noted in HPI.   Physical Exam: BP 136/74   Pulse 80   Ht _0  (1.803 m)   Wt 245 lb (111.1 kg)   BMI 34.17 kg/m  Constitutional: Pleasant,well-developed, male in no  acute distress. HEENT: Normocephalic and atraumatic. Conjunctivae are normal. No scleral icterus. Cardiovascular: Normal rate, regular rhythm.  Pulmonary/chest: Effort normal and breath sounds normal. No wheezing, rales or rhonchi. Abdominal: Soft, nondistended, nontender. Bowel sounds active throughout. There are no masses palpable. No hepatomegaly. Extremities: No edema Neurological: Alert and oriented to person place and time.  Skin: Skin is warm and dry. No rashes noted. Psychiatric: Normal mood and affect. Behavior is normal.  Labs 06/2021: CBC and CMP unremarkable  Gastric emptying study 04/08/19: IMPRESSION: Normal gastric emptying.  EGD 01/08/12: Normal  Colonoscopy 01/07/13: Few diverticula, internal hemorrhoids  EGD 03/22/19: - Mild gastritis, biopsied to check for H. pylori. - The examination was otherwise normal. Path: Surgical [P], gastric antrum and gastric body - CHRONIC ACTIVE GASTRITIS - NO INTESTINAL METAPLASIA IDENTIFIED - SEE COMMENT Comment: H pylori was negative.  ASSESSMENT AND PLAN: Constipation Bloating Ab discomfort Dyspepsia Patient presents with longstanding issues with dyspepsia and constipation.  Gastric emptying study was normal, and EGD in 2020 showed mild gastritis with biopsies that were negative for H. pylori.  Patient does describe that he had some improvement in his GI symptoms when he was having regular bowel movements.  It is possible that his constipation could explain some of his bloating and abdominal discomfort.  Thus we will plan to initiate therapies to help with his constipation. - Drink 8 cups of water per day - Walk 30 minutes every day to the best of his ability - Start fiber supplement, Benefiber or Metamucil - Start Miralax QD, can titrate to achieve 1 BM per day - PPI BID, take 30 minutes before he eats - Consider hydrogen breath test in the future - Next colonoscopy is due in 01/2023 - RTC 2 months  Christia Reading, MD

## 2021-09-03 DIAGNOSIS — H40013 Open angle with borderline findings, low risk, bilateral: Secondary | ICD-10-CM | POA: Diagnosis not present

## 2021-09-03 DIAGNOSIS — H524 Presbyopia: Secondary | ICD-10-CM | POA: Diagnosis not present

## 2021-09-03 DIAGNOSIS — H52223 Regular astigmatism, bilateral: Secondary | ICD-10-CM | POA: Diagnosis not present

## 2021-09-03 DIAGNOSIS — H40012 Open angle with borderline findings, low risk, left eye: Secondary | ICD-10-CM | POA: Diagnosis not present

## 2021-09-03 DIAGNOSIS — H5201 Hypermetropia, right eye: Secondary | ICD-10-CM | POA: Diagnosis not present

## 2021-09-03 DIAGNOSIS — H3562 Retinal hemorrhage, left eye: Secondary | ICD-10-CM | POA: Diagnosis not present

## 2021-09-03 DIAGNOSIS — H40011 Open angle with borderline findings, low risk, right eye: Secondary | ICD-10-CM | POA: Diagnosis not present

## 2021-09-04 ENCOUNTER — Ambulatory Visit: Payer: Medicare HMO | Admitting: Internal Medicine

## 2021-09-17 ENCOUNTER — Telehealth (INDEPENDENT_AMBULATORY_CARE_PROVIDER_SITE_OTHER): Payer: Medicare HMO | Admitting: Family Medicine

## 2021-09-17 ENCOUNTER — Encounter: Payer: Self-pay | Admitting: Family Medicine

## 2021-09-17 ENCOUNTER — Telehealth: Payer: Self-pay

## 2021-09-17 VITALS — Ht 71.0 in

## 2021-09-17 DIAGNOSIS — U071 COVID-19: Secondary | ICD-10-CM

## 2021-09-17 DIAGNOSIS — R059 Cough, unspecified: Secondary | ICD-10-CM

## 2021-09-17 MED ORDER — BENZONATATE 100 MG PO CAPS: 200.0000 mg | ORAL_CAPSULE | Freq: Two times a day (BID) | ORAL | 0 refills | Status: AC | PRN

## 2021-09-17 MED ORDER — FLUTICASONE PROPIONATE 50 MCG/ACT NA SUSP: 1.0000 | Freq: Two times a day (BID) | NASAL | 0 refills | Status: DC

## 2021-09-17 MED ORDER — MOLNUPIRAVIR EUA 200MG CAPSULE: 4.0000 | ORAL_CAPSULE | Freq: Two times a day (BID) | ORAL | 0 refills | Status: AC

## 2021-09-17 NOTE — Telephone Encounter (Signed)
Appt scheduled w/ Dr. [place] 09/17/21.

## 2021-09-17 NOTE — Telephone Encounter (Signed)
Patient Name: Daniel Cruz Gender: Male DOB: 07/11/1951 Age: 70 Y 9 M 16 D Return Phone Number:[phone] (Primary), [phone] (Secondary) Provider Kathlene November - MD Contact Type Call Who Is Calling: Self Return Phone Number [phone] (Primary) Chief Complaint Prescription Refill or Medication Request (non symptomatic) Reason for Call Medication Question / Request Initial Comment Caller states he's positive for covid he has a heart condition,diabetes, and over weight. Would like medication.  ---Caller states he's positive for covid. Would like medication. Sore throat, runny nose x 1 day ago. Mild cough

## 2021-09-17 NOTE — Progress Notes (Signed)
Virtual Visit via Video Note I connected with Daniel Cruz on 09/17/21 by a video enabled telemedicine application and verified that I am speaking with the correct person using two identifiers.  Location patient: home Location provider:work office Persons participating in the virtual visit: patient, provider  I discussed the limitations of evaluation and management by telemedicine and the availability of in person appointments. The patient expressed understanding and agreed to proceed.  Chief Complaint  Patient presents with   Covid Positive    Tested positive on 12/11, symptoms include sore throat & congestion.   HPI: Daniel Cruz is a 70 yo male with hx of DM II,HLD,CAD,and HTN c/o 2 days of respiratory symptoms and positive home COVID 19 test as described above.  Saturday night (9:30 pm) he started  with "scratchy throat" gradually getting worse, later he started with fever, 100.0 F. Fatigue, chills, body aches, nasal congestion, rhinorrhea, and nonproductive cough.  Negative for anosmia,ageusia,CP, palpitation, dyspnea, wheezing, abdominal pain, nausea, vomiting, changes in bowel habits, urinary symptoms, or a skin rash. Cough does interfere with sleep.  He is taking Tylenol and ibuprofen, which have helped. Symptoms are stable.  No known sick contact.  ROS: See pertinent positives and negatives per HPI.  Past Medical History:  Diagnosis Date   Anemia, iron deficiency    hx   CAD (coronary artery disease)     NSTEMI with PCI of the mid and distal RCA with 50-60% LAD 09/2000, s/p cath 01/2009 with patent RCA stent and 90% LAD s/p PCI of LAD and right PDA   Cholelithiasis    DIABETES MELLITUS, TYPE II 02/26/2007   Fatty liver 02/2009   per u/s   GERD (gastroesophageal reflux disease)    HTN (hypertension)    Myocardial infarction ([place])    2001   OSA (obstructive sleep apnea)    can't tol. a CPAP   Plantar fasciitis of left foot    PVC (premature ventricular  contraction) 06/06/2017   Sleep apnea    UTI (lower urinary tract infection)    in the 80s and 11/09    Past Surgical History:  Procedure Laterality Date   CARDIAC CATHETERIZATION  10/2008   stents   CHOLECYSTECTOMY N/A 07/16/2016   Procedure: LAPAROSCOPIC CHOLECYSTECTOMY WITH INTRAOPERATIVE CHOLANGIOGRAM;  Surgeon: [name], MD;  Location: [facility];  Service: General;  Laterality: N/A;   CORONARY STENT INTERVENTION N/A 09/20/2019   Procedure: CORONARY STENT INTERVENTION;  Surgeon: Burnell Blanks, MD;  Location: [facility];  Service: Cardiovascular;  Laterality: N/A;   LEFT HEART CATH AND CORONARY ANGIOGRAPHY N/A 09/20/2019   Procedure: LEFT HEART CATH AND CORONARY ANGIOGRAPHY;  Surgeon: Burnell Blanks, MD;  Location: [facility];  Service: Cardiovascular;  Laterality: N/A;   SEPTOPLASTY     w/ bilateral inferior turbinate reductions   TONSILLECTOMY      Family History  Problem Relation Age of Onset   Emphysema Mother        died   COPD Mother    Stroke Father    Coronary artery disease Father    Cancer Neg Hx        colon ,prostate   Diabetes Neg Hx    Colon cancer Neg Hx    Esophageal cancer Neg Hx    Rectal cancer Neg Hx    Stomach cancer Neg Hx    Prostate cancer Neg Hx     Social History   Socioeconomic History   Marital status: Married  Spouse name: Not on file   Number of children: 2   Years of education: Not on file   Highest education level: Not on file  Occupational History   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, production at [employer]  Tobacco Use   Smoking status: Former    Packs/day: 1.50    Years: 16.00    Pack years: 24.00    Types: Cigarettes    Quit date: 07/08/2000    Years since quitting: 21.2   Smokeless tobacco: Never   Tobacco comments:    2001  Vaping Use   Vaping Use: Never used  Substance and Sexual Activity   Alcohol use: Yes    Comment: socially   Drug use: No    Sexual activity: Yes  Other Topics Concern   Not on file  Social History Narrative   Lives in [place], [place]   Lives w/ wife, 2 children, 2 g-kids   Retired April 2017          Social Determinants of Health   Financial Resource Strain: Not on file  Food Insecurity: Not on file  Transportation Needs: Not on file  Physical Activity: Not on file  Stress: Not on file  Social Connections: Not on file  Intimate Partner Violence: Not on file   Current Outpatient Medications:    aspirin EC 81 MG tablet, Take 81 mg by mouth at bedtime. , Disp: , Rfl:    benzonatate (TESSALON) 100 MG capsule, Take 2 capsules (200 mg total) by mouth 2 (two) times daily as needed for up to 10 days., Disp: 40 capsule, Rfl: 0   blood glucose meter kit and supplies, Dispense based on patient and insurance preference. Check blood sugar once daily, Disp: 1 each, Rfl: 0   carvedilol (COREG) 6.25 MG tablet, Take 1 tablet (6.25 mg total) by mouth 2 (two) times daily with a meal., Disp: 180 tablet, Rfl: 1   cetirizine (ZYRTEC) 10 MG tablet, Take 10 mg by mouth daily., Disp: , Rfl:    cholecalciferol (VITAMIN D3) 25 MCG (1000 UNIT) tablet, Take 1,000 Units by mouth daily., Disp: , Rfl:    clopidogrel (PLAVIX) 75 MG tablet, TAKE 1 TABLET EVERY DAY WITH BREAKFAST, Disp: 90 tablet, Rfl: 3   dapagliflozin propanediol (FARXIGA) 10 MG TABS tablet, Take 1 tablet (10 mg total) by mouth daily before breakfast., Disp: 90 tablet, Rfl: 1   ezetimibe-simvastatin (VYTORIN) 10-10 MG tablet, Take 1 tablet by mouth daily., Disp: 90 tablet, Rfl: 3   fluticasone (FLONASE) 50 MCG/ACT nasal spray, Place 1 spray into both nostrils 2 (two) times daily., Disp: 16 g, Rfl: 0   glipiZIDE (GLUCOTROL XL) 10 MG 24 hr tablet, TAKE 1 TABLET DAILY WITH   BREAKFAST, Disp: 90 tablet, Rfl: 1   guaiFENesin (MUCINEX) 600 MG 12 hr tablet, Take by mouth 2 (two) times daily., Disp: , Rfl:    isosorbide mononitrate (IMDUR) 30 MG 24 hr tablet, Take 1 tablet (30 mg  total) by mouth daily., Disp: 90 tablet, Rfl: 3   lisinopril (ZESTRIL) 20 MG tablet, Take 1 tablet (20 mg total) by mouth daily., Disp: 90 tablet, Rfl: 3   molnupiravir EUA (LAGEVRIO) 200 mg CAPS capsule, Take 4 capsules (800 mg total) by mouth 2 (two) times daily for 5 days., Disp: 40 capsule, Rfl: 0   Multiple Vitamin (MULTIVITAMIN) tablet, Take 1 tablet by mouth daily., Disp: , Rfl:    nitroGLYCERIN (NITROSTAT) 0.4 MG SL tablet, PLACE 1 TABLET UNDER  THE TONGUE EVERY 5 (FIVE) MINUTES X 3 DOSES AS NEEDED FOR CHEST PAIN., Disp: 25 tablet, Rfl: 1   ONETOUCH ULTRA test strip, CHECK BLOOD SUGAR 1 DAILY, Disp: 100 strip, Rfl: 1   pantoprazole (PROTONIX) 40 MG tablet, Take 1 tablet (40 mg total) by mouth 2 (two) times daily before a meal., Disp: 180 tablet, Rfl: 3   pioglitazone-metformin (ACTOPLUS MET) 15-850 MG tablet, TAKE 1 TABLET TWICE DAILY  WITH MEALS, Disp: 180 tablet, Rfl: 1   TRADJENTA 5 MG TABS tablet, TAKE 1 TABLET DAILY, Disp: 90 tablet, Rfl: 1  EXAM:  VITALS per patient if applicable:Ht <YIFOYDXAJOINOMVE>_7<\/MCNOBSJGGEZMOQHU>_7  (1.803 m)   BMI 34.17 kg/m   GENERAL: alert, oriented, appears well and in no acute distress  HEENT: atraumatic,normocephalic, conjunctiva clear, no obvious abnormalities on inspection of external nose and ears  LUNGS: on inspection no signs of respiratory distress, breathing rate appears normal, no obvious gross SOB, gasping or wheezing.  PSYCH/NEURO: pleasant and cooperative, no obvious depression or anxiety, speech and thought processing grossly intact  ASSESSMENT AND PLAN:  Discussed the following assessment and plan:  COVID-19 virus infection - Plan: molnupiravir EUA (LAGEVRIO) 200 mg CAPS capsule We discussed Dx,possible complications and treatment options. He has a mild to moderate case with risk for complications. We discussed oral antiviral options and side effects. He agrees with trying Molnupiravir. Symptomatic treatment with plenty of fluids,rest,tylenol 500 mg 3-4 times  per day prn. Flonase nasal spray for nasal congestion and rhinorrhea. 5-7 days quarantine. Clearly instructed about warning signs.  Cough, unspecified type Explained that cough and congestion may last a few more days and even weeks after acute symptoms have resolved. Benzonatate 100 mg 1 to 2 capsules twice daily as needed recommended.  Cough may become more productive in a few days, at that time he can also take plain Mucinex.   We discussed possible serious and likely etiologies, options for evaluation and workup, limitations of telemedicine visit vs in person visit, treatment, treatment risks and precautions. He was advised to call back or seek an in-person evaluation if the symptoms worsen or if the condition fails to improve as anticipated. I discussed the assessment and treatment plan with the patient.  Mr. Avans was provided an opportunity to ask questions and all were answered.  He agreed with the plan and demonstrated an understanding of the instructions.  Return if symptoms worsen or fail to improve. Rudine Rieger G. Martinique, MD  [facility]. [place] office.

## 2021-10-09 ENCOUNTER — Other Ambulatory Visit: Payer: Self-pay | Admitting: Family Medicine

## 2021-10-31 ENCOUNTER — Other Ambulatory Visit: Payer: Self-pay | Admitting: Cardiology

## 2021-11-13 ENCOUNTER — Encounter: Payer: Self-pay | Admitting: Internal Medicine

## 2021-11-13 ENCOUNTER — Ambulatory Visit (INDEPENDENT_AMBULATORY_CARE_PROVIDER_SITE_OTHER): Payer: Medicare HMO | Admitting: Internal Medicine

## 2021-11-13 VITALS — BP 120/78 | HR 61 | Ht 71.0 in | Wt 250.2 lb

## 2021-11-13 DIAGNOSIS — R140 Abdominal distension (gaseous): Secondary | ICD-10-CM

## 2021-11-13 DIAGNOSIS — R109 Unspecified abdominal pain: Secondary | ICD-10-CM | POA: Diagnosis not present

## 2021-11-13 DIAGNOSIS — K5900 Constipation, unspecified: Secondary | ICD-10-CM

## 2021-11-13 MED ORDER — LINACLOTIDE 145 MCG PO CAPS: 145.0000 ug | ORAL_CAPSULE | Freq: Every day | ORAL | 3 refills | Status: DC

## 2021-11-13 NOTE — Progress Notes (Signed)
Chief Complaint: Dyspepsia and constipation  HPI : 71 year old male with history of cholelithiasis s/p cholecystectomy, CAD s/p PCI, DM, fatty liver, GERD, OSA presents for follow up for dyspepsia and constipation  Interval History: He took Miralax daily, but he has not found any improvement in his bowel habits with MiraLAX therapy.  He did try Benefiber and also did not find any improvement. Denies ab pain. Still has bloating issues, which resolve after he has a BM. He has started jogging again and drinking more water.  He has been taking PPI twice daily without any issues.  Current Outpatient Medications  Medication Sig Dispense Refill   aspirin EC 81 MG tablet Take 81 mg by mouth at bedtime.      blood glucose meter kit and supplies Dispense based on patient and insurance preference. Check blood sugar once daily 1 each 0   carvedilol (COREG) 6.25 MG tablet TAKE 1 TABLET BY MOUTH 2 TIMES DAILY WITH A MEAL. 180 tablet 1   cetirizine (ZYRTEC) 10 MG tablet Take 10 mg by mouth daily.     cholecalciferol (VITAMIN D3) 25 MCG (1000 UNIT) tablet Take 1,000 Units by mouth daily.     clopidogrel (PLAVIX) 75 MG tablet TAKE 1 TABLET EVERY DAY WITH BREAKFAST 90 tablet 3   dapagliflozin propanediol (FARXIGA) 10 MG TABS tablet Take 1 tablet (10 mg total) by mouth daily before breakfast. 90 tablet 1   ezetimibe-simvastatin (VYTORIN) 10-10 MG tablet Take 1 tablet by mouth daily. 90 tablet 3   fluticasone (FLONASE) 50 MCG/ACT nasal spray Place 1 spray into both nostrils 2 (two) times daily. 16 mL 5   glipiZIDE (GLUCOTROL XL) 10 MG 24 hr tablet TAKE 1 TABLET DAILY WITH   BREAKFAST 90 tablet 1   guaiFENesin (MUCINEX) 600 MG 12 hr tablet Take by mouth 2 (two) times daily.     isosorbide mononitrate (IMDUR) 30 MG 24 hr tablet Take 1 tablet (30 mg total) by mouth daily. 90 tablet 3   linaclotide (LINZESS) 145 MCG CAPS capsule Take 1 capsule (145 mcg total) by mouth daily before breakfast. 90 capsule 3    lisinopril (ZESTRIL) 20 MG tablet Take 1 tablet (20 mg total) by mouth daily. 90 tablet 3   Multiple Vitamin (MULTIVITAMIN) tablet Take 1 tablet by mouth daily.     nitroGLYCERIN (NITROSTAT) 0.4 MG SL tablet PLACE 1 TABLET UNDER THE TONGUE EVERY 5 (FIVE) MINUTES X 3 DOSES AS NEEDED FOR CHEST PAIN. 25 tablet 1   ONETOUCH ULTRA test strip CHECK BLOOD SUGAR 1 DAILY 100 strip 1   pantoprazole (PROTONIX) 40 MG tablet Take 1 tablet (40 mg total) by mouth 2 (two) times daily before a meal. 180 tablet 3   pioglitazone-metformin (ACTOPLUS MET) 15-850 MG tablet TAKE 1 TABLET TWICE DAILY  WITH MEALS 180 tablet 1   polyethylene glycol (MIRALAX / GLYCOLAX) 17 g packet Take 17 g by mouth daily as needed.     TRADJENTA 5 MG TABS tablet TAKE 1 TABLET DAILY 90 tablet 1   No current facility-administered medications for this visit.   Review of Systems: All systems reviewed and negative except where noted in HPI.   Physical Exam: BP 120/78    Pulse 61    Ht _0  (1.803 m)    Wt 250 lb 4 oz (113.5 kg)    SpO2 96%    BMI 34.90 kg/m  Constitutional: Pleasant,well-developed, male in no acute distress. HEENT: Normocephalic and atraumatic. Conjunctivae are normal. No  scleral icterus. Cardiovascular: Normal rate, regular rhythm.  Pulmonary/chest: Effort normal and breath sounds normal. No wheezing, rales or rhonchi. Abdominal: Soft, nondistended, nontender. Bowel sounds active throughout. There are no masses palpable. No hepatomegaly. Extremities: No edema Neurological: Alert and oriented to person place and time. Skin: Skin is warm and dry. No rashes noted. Psychiatric: Normal mood and affect. Behavior is normal.  Labs 06/2021: CBC and CMP unremarkable  Gastric emptying study 04/08/19: IMPRESSION: Normal gastric emptying.  EGD 01/08/12: Normal  Colonoscopy 01/07/13: Few diverticula, internal hemorrhoids  EGD 03/22/19: - Mild gastritis, biopsied to check for H. pylori. - The examination was otherwise  normal. Path: Surgical [P], gastric antrum and gastric body - CHRONIC ACTIVE GASTRITIS - NO INTESTINAL METAPLASIA IDENTIFIED - SEE COMMENT Comment: H pylori was negative.  ASSESSMENT AND PLAN: Constipation Bloating Ab discomfort Dyspepsia Patient presents with longstanding issues with dyspepsia and constipation.  Gastric emptying study was normal, and EGD in 2020 showed mild gastritis with biopsies that were negative for H. pylori.  Currently already on PPI twice daily therapy.  Since patient has dramatic improvement in his bloating and abdominal discomfort with having a BM, it is likely that constipation explains his symptoms.  Since he has not responded to MiraLAX and conservative measures, will plan to start him on Linzess therapy . - Start Linzess 145 mcg daily - PPI BID, take 30 minutes before he eats - Consider hydrogen breath test in the future - Next colonoscopy is due in 01/2023 - RTC 2 months  Christia Reading, MD

## 2021-11-13 NOTE — Patient Instructions (Signed)
If you are age 73 or older, your body mass index should be between 23-30. Your Body mass index is 34.9 kg/m. If this is out of the aforementioned range listed, please consider follow up with your Primary Care Provider.  If you are age 69 or younger, your body mass index should be between 19-25. Your Body mass index is 34.9 kg/m. If this is out of the aformentioned range listed, please consider follow up with your Primary Care Provider.   ________________________________________________________  The [facility] providers would like to encourage you to use [facility] to communicate with providers for non-urgent requests or questions.  Due to long hold times on the telephone, sending your provider a message by [facility] may be a faster and more efficient way to get a response.  Please allow 48 business hours for a response.  Please remember that this is for non-urgent requests.  _______________________________________________________  We have sent the following medications to your pharmacy for you to pick up at your convenience:  Linzess  Please follow up in 2 months

## 2021-11-14 ENCOUNTER — Encounter: Payer: Self-pay | Admitting: Internal Medicine

## 2021-12-07 ENCOUNTER — Ambulatory Visit (INDEPENDENT_AMBULATORY_CARE_PROVIDER_SITE_OTHER): Payer: Medicare HMO | Admitting: Family

## 2021-12-07 VITALS — BP 110/59 | HR 74 | Temp 98.4°F | Resp 16 | Ht 71.0 in | Wt 253.0 lb

## 2021-12-07 DIAGNOSIS — R300 Dysuria: Secondary | ICD-10-CM

## 2021-12-07 DIAGNOSIS — H6982 Other specified disorders of Eustachian tube, left ear: Secondary | ICD-10-CM

## 2021-12-07 DIAGNOSIS — H6992 Unspecified Eustachian tube disorder, left ear: Secondary | ICD-10-CM | POA: Insufficient documentation

## 2021-12-07 LAB — POC URINALSYSI DIPSTICK (AUTOMATED)
Blood, UA: NEGATIVE
Glucose, UA: POSITIVE — AB
Leukocytes, UA: NEGATIVE
Nitrite, UA: NEGATIVE
Protein, UA: NEGATIVE
Spec Grav, UA: 1.015 (ref 1.010–1.025)
Urobilinogen, UA: 0.2 E.U./dL
pH, UA: 6 (ref 5.0–8.0)

## 2021-12-07 MED ORDER — SULFAMETHOXAZOLE-TRIMETHOPRIM 800-160 MG PO TABS: 1.0000 | ORAL_TABLET | Freq: Two times a day (BID) | ORAL | 0 refills | Status: DC

## 2021-12-07 NOTE — Assessment & Plan Note (Signed)
UA unremarkable except for glucosuria (on farxiga).  Hx consistent with UTI. He reports that he has had similar presentation in the past with UTI and has tolerated bactrim. Will send urine for culture and plan to begin empiric rx with bactrim ds.   ?

## 2021-12-07 NOTE — Progress Notes (Signed)
Subjective:   By signing my name below, I, Shehryar Baig, attest that this documentation has been prepared under the direction and in the presence of Debbrah Alar, NP. 12/07/2021   Patient ID: Daniel Cruz, male    DOB: 11/24/1950, 71 y.o.   MRN: [id number]  Chief Complaint  Patient presents with   Dysuria    Complains of dysuria and urinary frequency that started yesterday afternoon   Ear Fullness    Complains of left ear fullness    Dysuria  Associated symptoms include frequency. Pertinent negatives include no hematuria.  Patient is in today for an office visit.  Right Ear - Patient complains of right ear feeling "full." Symptoms begun 2 months ago. He is recommended to take Flonase. He has begun taking  Urination - Patient complains of frequent urination and dysuria. Symptoms begun 03/02 and have worsen since then.    Health Maintenance Due  Topic Date Due   INFLUENZA VACCINE  05/07/2021    Past Medical History:  Diagnosis Date   Anemia, iron deficiency    hx   CAD (coronary artery disease)     NSTEMI with PCI of the mid and distal RCA with 50-60% LAD 09/2000, s/p cath 01/2009 with patent RCA stent and 90% LAD s/p PCI of LAD and right PDA   Cholelithiasis    DIABETES MELLITUS, TYPE II 02/26/2007   Fatty liver 02/2009   per u/s   GERD (gastroesophageal reflux disease)    HTN (hypertension)    Myocardial infarction ([place])    2001   OSA (obstructive sleep apnea)    can't tol. a CPAP   Plantar fasciitis of left foot    PVC (premature ventricular contraction) 06/06/2017   Sleep apnea    UTI (lower urinary tract infection)    in the 80s and 11/09    Past Surgical History:  Procedure Laterality Date   CARDIAC CATHETERIZATION  10/2008   stents   CHOLECYSTECTOMY N/A 07/16/2016   Procedure: LAPAROSCOPIC CHOLECYSTECTOMY WITH INTRAOPERATIVE CHOLANGIOGRAM;  Surgeon: [name], MD;  Location: [facility];  Service: General;  Laterality: N/A;   CORONARY STENT  INTERVENTION N/A 09/20/2019   Procedure: CORONARY STENT INTERVENTION;  Surgeon: Burnell Blanks, MD;  Location: [facility];  Service: Cardiovascular;  Laterality: N/A;   LEFT HEART CATH AND CORONARY ANGIOGRAPHY N/A 09/20/2019   Procedure: LEFT HEART CATH AND CORONARY ANGIOGRAPHY;  Surgeon: Burnell Blanks, MD;  Location: [facility];  Service: Cardiovascular;  Laterality: N/A;   SEPTOPLASTY     w/ bilateral inferior turbinate reductions   TONSILLECTOMY      Family History  Problem Relation Age of Onset   Emphysema Mother        died   COPD Mother    Stroke Father    Coronary artery disease Father    Cancer Neg Hx        colon ,prostate   Diabetes Neg Hx    Colon cancer Neg Hx    Esophageal cancer Neg Hx    Rectal cancer Neg Hx    Stomach cancer Neg Hx    Prostate cancer Neg Hx     Social History   Socioeconomic History   Marital status: Married    Spouse name: Not on file   Number of children: 2   Years of education: Not on file   Highest education level: Not on file  Occupational History   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  Tobacco Use   Smoking status: Former    Packs/day: 1.50    Years: 16.00    Pack years: 24.00    Types: Cigarettes    Quit date: 07/08/2000    Years since quitting: 21.4   Smokeless tobacco: Never   Tobacco comments:    2001  Vaping Use   Vaping Use: Never used  Substance and Sexual Activity   Alcohol use: Yes    Comment: socially   Drug use: No   Sexual activity: Yes  Other Topics Concern   Not on file  Social History Narrative   Lives in [place], [place]   Lives w/ wife, 2 children, 2 g-kids   Retired April 2017          Social Determinants of Health   Financial Resource Strain: Not on file  Food Insecurity: Not on file  Transportation Needs: Not on file  Physical Activity: Not on file  Stress: Not on file  Social Connections: Not on file   Intimate Partner Violence: Not on file    Outpatient Medications Prior to Visit  Medication Sig Dispense Refill   aspirin EC 81 MG tablet Take 81 mg by mouth at bedtime.      blood glucose meter kit and supplies Dispense based on patient and insurance preference. Check blood sugar once daily 1 each 0   carvedilol (COREG) 6.25 MG tablet TAKE 1 TABLET BY MOUTH 2 TIMES DAILY WITH A MEAL. 180 tablet 1   cetirizine (ZYRTEC) 10 MG tablet Take 10 mg by mouth daily.     cholecalciferol (VITAMIN D3) 25 MCG (1000 UNIT) tablet Take 1,000 Units by mouth daily.     clopidogrel (PLAVIX) 75 MG tablet TAKE 1 TABLET EVERY DAY WITH BREAKFAST 90 tablet 3   dapagliflozin propanediol (FARXIGA) 10 MG TABS tablet Take 1 tablet (10 mg total) by mouth daily before breakfast. 90 tablet 1   ezetimibe-simvastatin (VYTORIN) 10-10 MG tablet Take 1 tablet by mouth daily. 90 tablet 3   fluticasone (FLONASE) 50 MCG/ACT nasal spray Place 1 spray into both nostrils 2 (two) times daily. 16 mL 5   glipiZIDE (GLUCOTROL XL) 10 MG 24 hr tablet TAKE 1 TABLET DAILY WITH   BREAKFAST 90 tablet 1   guaiFENesin (MUCINEX) 600 MG 12 hr tablet Take by mouth 2 (two) times daily.     isosorbide mononitrate (IMDUR) 30 MG 24 hr tablet Take 1 tablet (30 mg total) by mouth daily. 90 tablet 3   linaclotide (LINZESS) 145 MCG CAPS capsule Take 1 capsule (145 mcg total) by mouth daily before breakfast. 90 capsule 3   lisinopril (ZESTRIL) 20 MG tablet Take 1 tablet (20 mg total) by mouth daily. 90 tablet 3   Multiple Vitamin (MULTIVITAMIN) tablet Take 1 tablet by mouth daily.     nitroGLYCERIN (NITROSTAT) 0.4 MG SL tablet PLACE 1 TABLET UNDER THE TONGUE EVERY 5 (FIVE) MINUTES X 3 DOSES AS NEEDED FOR CHEST PAIN. 25 tablet 1   ONETOUCH ULTRA test strip CHECK BLOOD SUGAR 1 DAILY 100 strip 1   pantoprazole (PROTONIX) 40 MG tablet Take 1 tablet (40 mg total) by mouth 2 (two) times daily before a meal. 180 tablet 3   pioglitazone-metformin (ACTOPLUS MET)  15-850 MG tablet TAKE 1 TABLET TWICE DAILY  WITH MEALS 180 tablet 1   polyethylene glycol (MIRALAX / GLYCOLAX) 17 g packet Take 17 g by mouth daily as needed.     TRADJENTA 5 MG TABS tablet TAKE  1 TABLET DAILY 90 tablet 1   No facility-administered medications prior to visit.    Allergies  Allergen Reactions   Januvia [Sitagliptin] Nausea Only and Other (See Comments)    Back Pain    Review of Systems  Constitutional:  Negative for fever.  Genitourinary:  Positive for dysuria and frequency. Negative for hematuria.      Objective:    Physical Exam Constitutional:      General: He is not in acute distress.    Appearance: Normal appearance. He is not ill-appearing.  HENT:     Head: Normocephalic and atraumatic.     Right Ear: External ear normal.     Left Ear: External ear normal.  Eyes:     Extraocular Movements: Extraocular movements intact.     Pupils: Pupils are equal, round, and reactive to light.  Cardiovascular:     Rate and Rhythm: Normal rate and regular rhythm.     Heart sounds: Normal heart sounds. No murmur heard.   No gallop.  Pulmonary:     Effort: Pulmonary effort is normal. No respiratory distress.     Breath sounds: Normal breath sounds. No wheezing or rales.  Abdominal:     Tenderness: There is right CVA tenderness and left CVA tenderness.  Skin:    General: Skin is warm and dry.  Neurological:     Mental Status: He is alert and oriented to person, place, and time.  Psychiatric:        Judgment: Judgment normal.    BP (!) 110/59 (BP Location: Right Arm, Patient Position: Sitting, Cuff Size: Large)    Pulse 74    Temp 98.4 F (36.9 C) (Oral)    Resp 16    Ht $R'5\' 11"'Lx$  (1.803 m)    Wt 253 lb (114.8 kg)    SpO2 98%    BMI 35.29 kg/m  Wt Readings from Last 3 Encounters:  12/07/21 253 lb (114.8 kg)  11/13/21 250 lb 4 oz (113.5 kg)  08/28/21 245 lb (111.1 kg)       Assessment & Plan:   Problem List Items Addressed This Visit       Unprioritized    Dysuria - Primary    UA unremarkable except for glucosuria (on farxiga).  Hx consistent with UTI. He reports that he has had similar presentation in the past with UTI and has tolerated bactrim. Will send urine for culture and plan to begin empiric rx with bactrim ds.        Relevant Orders   POCT Urinalysis Dipstick (Automated) (Completed)   Urine Culture   Dysfunction of left eustachian tube    Normal ear exam. Recommended trial of flonase 2 sprays each nostril daily.          Meds ordered this encounter  Medications   sulfamethoxazole-trimethoprim (BACTRIM DS) 800-160 MG tablet    Sig: Take 1 tablet by mouth 2 (two) times daily.    Dispense:  10 tablet    Refill:  0    Order Specific Question:   Supervising Provider    Answer:   Penni Homans A [[id number]]    I, Nance Pear, NP, personally preformed the services described in this documentation.  All medical record entries made by the scribe were at my direction and in my presence.  I have reviewed the chart and discharge instructions (if applicable) and agree that the record reflects my personal performance and is accurate and complete. 12/07/2021   I,Shehryar Baig,acting as a  scribe for Nance Pear, NP.,have documented all relevant documentation on the behalf of Nance Pear, NP,as directed by  Nance Pear, NP while in the presence of Nance Pear, NP.    Nance Pear, NP

## 2021-12-07 NOTE — Patient Instructions (Signed)
Please begin bactrim for possible urinary tract infection.  ?

## 2021-12-07 NOTE — Assessment & Plan Note (Signed)
Normal ear exam. Recommended trial of flonase 2 sprays each nostril daily.  ?

## 2021-12-10 ENCOUNTER — Telehealth: Payer: Self-pay | Admitting: Internal Medicine

## 2021-12-10 ENCOUNTER — Encounter: Payer: Self-pay | Admitting: Family

## 2021-12-10 DIAGNOSIS — N390 Urinary tract infection, site not specified: Secondary | ICD-10-CM

## 2021-12-10 LAB — URINE CULTURE
MICRO NUMBER:: 13085207
SPECIMEN QUALITY:: ADEQUATE

## 2021-12-10 MED ORDER — SULFAMETHOXAZOLE-TRIMETHOPRIM 800-160 MG PO TABS: 1.0000 | ORAL_TABLET | Freq: Two times a day (BID) | ORAL | 0 refills | Status: DC

## 2021-12-10 NOTE — Telephone Encounter (Signed)
Spoke w/ Pt- lab appt scheduled for tomorrow.;  ?

## 2021-12-10 NOTE — Telephone Encounter (Signed)
Dr. Larose Kells, please see mychart message, [place].  Thanks.  ?

## 2021-12-10 NOTE — Telephone Encounter (Signed)
Advise patient, I saw his message. ?He had a urine culture + for Klebsiella. ?He was prescribed Bactrim x14 days. ?Arrange for a PSA and CBC to be drawn tomorrow, DX UTI. ?Keep the appointment he has with me in few days. ? ? ?

## 2021-12-11 ENCOUNTER — Other Ambulatory Visit (INDEPENDENT_AMBULATORY_CARE_PROVIDER_SITE_OTHER): Payer: Medicare HMO

## 2021-12-11 DIAGNOSIS — N390 Urinary tract infection, site not specified: Secondary | ICD-10-CM | POA: Diagnosis not present

## 2021-12-11 LAB — CBC WITH DIFFERENTIAL/PLATELET
Basophils Absolute: 0.1 10*3/uL (ref 0.0–0.1)
Basophils Relative: 1.5 % (ref 0.0–3.0)
Eosinophils Absolute: 0.2 10*3/uL (ref 0.0–0.7)
Eosinophils Relative: 3.9 % (ref 0.0–5.0)
HCT: 38.2 % — ABNORMAL LOW (ref 39.0–52.0)
Hemoglobin: 12.8 g/dL — ABNORMAL LOW (ref 13.0–17.0)
Lymphocytes Relative: 10.7 % — ABNORMAL LOW (ref 12.0–46.0)
Lymphs Abs: 0.5 10*3/uL — ABNORMAL LOW (ref 0.7–4.0)
MCHC: 33.4 g/dL (ref 30.0–36.0)
MCV: 101.9 fl — ABNORMAL HIGH (ref 78.0–100.0)
Monocytes Absolute: 0.7 10*3/uL (ref 0.1–1.0)
Monocytes Relative: 15 % — ABNORMAL HIGH (ref 3.0–12.0)
Neutro Abs: 3 10*3/uL (ref 1.4–7.7)
Neutrophils Relative %: 68.9 % (ref 43.0–77.0)
Platelets: 125 10*3/uL — ABNORMAL LOW (ref 150.0–400.0)
RBC: 3.75 Mil/uL — ABNORMAL LOW (ref 4.22–5.81)
RDW: 15.6 % — ABNORMAL HIGH (ref 11.5–15.5)
WBC: 4.4 10*3/uL (ref 4.0–10.5)

## 2021-12-11 LAB — PSA: PSA: 23.88 ng/mL — ABNORMAL HIGH (ref 0.10–4.00)

## 2021-12-11 MED ORDER — SULFAMETHOXAZOLE-TRIMETHOPRIM 800-160 MG PO TABS: 1.0000 | ORAL_TABLET | Freq: Two times a day (BID) | ORAL | 0 refills | Status: DC

## 2021-12-11 NOTE — Addendum Note (Signed)
Addended by: Kathlene November E on: 12/11/2021 11:54 AM ? ? Modules accepted: Orders ? ?

## 2021-12-11 NOTE — Telephone Encounter (Signed)
Daniel Cruz, call pt at your conveninece ?- Please cancel two current appointments  ?- Schedule appointment with me for a f/u 6 weeks from today ?JP ?==== ?Labs reviewed: ?PSA 23.8 consistent with prostatitis. ?CBC okay except for slightly low platelet count. ?Plan: ?Bactrim for a total of 4 weeks, rx sent  ?Call if not gradually better ?Office visit with me in 6 weeks ?Above discussed with patient, he verbalized understanding. ?

## 2021-12-14 ENCOUNTER — Ambulatory Visit: Payer: Medicare HMO | Admitting: Internal Medicine

## 2021-12-18 ENCOUNTER — Ambulatory Visit: Payer: Medicare HMO | Admitting: Internal Medicine

## 2021-12-19 ENCOUNTER — Other Ambulatory Visit: Payer: Self-pay | Admitting: Internal Medicine

## 2021-12-19 ENCOUNTER — Encounter: Payer: Self-pay | Admitting: Internal Medicine

## 2021-12-19 MED ORDER — SULFAMETHOXAZOLE-TRIMETHOPRIM 800-160 MG PO TABS: 1.0000 | ORAL_TABLET | Freq: Two times a day (BID) | ORAL | 0 refills | Status: DC

## 2022-01-05 ENCOUNTER — Other Ambulatory Visit: Payer: Self-pay | Admitting: Internal Medicine

## 2022-01-07 ENCOUNTER — Other Ambulatory Visit: Payer: Self-pay | Admitting: Internal Medicine

## 2022-01-07 ENCOUNTER — Encounter: Payer: Self-pay | Admitting: Internal Medicine

## 2022-01-07 MED ORDER — SULFAMETHOXAZOLE-TRIMETHOPRIM 800-160 MG PO TABS: 1.0000 | ORAL_TABLET | Freq: Two times a day (BID) | ORAL | 0 refills | Status: DC

## 2022-01-08 ENCOUNTER — Other Ambulatory Visit: Payer: Self-pay | Admitting: Internal Medicine

## 2022-01-11 ENCOUNTER — Other Ambulatory Visit: Payer: Self-pay | Admitting: Internal Medicine

## 2022-01-11 ENCOUNTER — Encounter: Payer: Self-pay | Admitting: Internal Medicine

## 2022-01-14 MED ORDER — DAPAGLIFLOZIN PROPANEDIOL 10 MG PO TABS: 10.0000 mg | ORAL_TABLET | Freq: Every day | ORAL | 0 refills | Status: DC

## 2022-01-21 ENCOUNTER — Other Ambulatory Visit: Payer: Self-pay | Admitting: Internal Medicine

## 2022-01-22 ENCOUNTER — Ambulatory Visit: Payer: Medicare HMO | Admitting: Internal Medicine

## 2022-01-23 ENCOUNTER — Ambulatory Visit (INDEPENDENT_AMBULATORY_CARE_PROVIDER_SITE_OTHER): Payer: Medicare HMO | Admitting: Internal Medicine

## 2022-01-23 ENCOUNTER — Encounter: Payer: Self-pay | Admitting: Internal Medicine

## 2022-01-23 VITALS — BP 132/80 | HR 50 | Temp 97.8°F | Resp 18 | Ht 71.0 in | Wt 249.5 lb

## 2022-01-23 DIAGNOSIS — E7800 Pure hypercholesterolemia, unspecified: Secondary | ICD-10-CM

## 2022-01-23 DIAGNOSIS — I10 Essential (primary) hypertension: Secondary | ICD-10-CM

## 2022-01-23 DIAGNOSIS — N410 Acute prostatitis: Secondary | ICD-10-CM | POA: Diagnosis not present

## 2022-01-23 DIAGNOSIS — N390 Urinary tract infection, site not specified: Secondary | ICD-10-CM

## 2022-01-23 DIAGNOSIS — E118 Type 2 diabetes mellitus with unspecified complications: Secondary | ICD-10-CM

## 2022-01-23 DIAGNOSIS — D696 Thrombocytopenia, unspecified: Secondary | ICD-10-CM | POA: Diagnosis not present

## 2022-01-23 DIAGNOSIS — G250 Essential tremor: Secondary | ICD-10-CM | POA: Diagnosis not present

## 2022-01-23 LAB — CBC WITH DIFFERENTIAL/PLATELET
Basophils Absolute: 0 10*3/uL (ref 0.0–0.1)
Basophils Relative: 0.6 % (ref 0.0–3.0)
Eosinophils Absolute: 0.1 10*3/uL (ref 0.0–0.7)
Eosinophils Relative: 2.6 % (ref 0.0–5.0)
HCT: 40.2 % (ref 39.0–52.0)
Hemoglobin: 13.2 g/dL (ref 13.0–17.0)
Lymphocytes Relative: 18.5 % (ref 12.0–46.0)
Lymphs Abs: 0.9 10*3/uL (ref 0.7–4.0)
MCHC: 33 g/dL (ref 30.0–36.0)
MCV: 104.2 fl — ABNORMAL HIGH (ref 78.0–100.0)
Monocytes Absolute: 0.6 10*3/uL (ref 0.1–1.0)
Monocytes Relative: 11 % (ref 3.0–12.0)
Neutro Abs: 3.4 10*3/uL (ref 1.4–7.7)
Neutrophils Relative %: 67.3 % (ref 43.0–77.0)
Platelets: 184 10*3/uL (ref 150.0–400.0)
RBC: 3.85 Mil/uL — ABNORMAL LOW (ref 4.22–5.81)
RDW: 15.3 % (ref 11.5–15.5)
WBC: 5 10*3/uL (ref 4.0–10.5)

## 2022-01-23 LAB — BASIC METABOLIC PANEL
BUN: 20 mg/dL (ref 6–23)
CO2: 27 mEq/L (ref 19–32)
Calcium: 9.4 mg/dL (ref 8.4–10.5)
Chloride: 106 mEq/L (ref 96–112)
Creatinine, Ser: 1.04 mg/dL (ref 0.40–1.50)
GFR: 71.42 mL/min (ref >60.00)
Glucose, Bld: 93 mg/dL (ref 70–99)
Potassium: 5.3 mEq/L — ABNORMAL HIGH (ref 3.5–5.1)
Sodium: 140 mEq/L (ref 135–145)

## 2022-01-23 LAB — URINALYSIS, ROUTINE W REFLEX MICROSCOPIC
Bilirubin Urine: NEGATIVE
Hgb urine dipstick: NEGATIVE
Leukocytes,Ua: NEGATIVE
Nitrite: NEGATIVE
Specific Gravity, Urine: 1.025 (ref 1.000–1.030)
Total Protein, Urine: NEGATIVE
Urine Glucose: >=1000 — AB
Urobilinogen, UA: 0.2 (ref 0.0–1.0)
pH: 5.5 (ref 5.0–8.0)

## 2022-01-23 LAB — LIPID PANEL
Cholesterol: 104 mg/dL (ref 0–200)
HDL: 40.7 mg/dL (ref >39.00)
LDL Cholesterol: 44 mg/dL (ref 0–99)
NonHDL: 63.69
Total CHOL/HDL Ratio: 3
Triglycerides: 98 mg/dL (ref 0.0–149.0)
VLDL: 19.6 mg/dL (ref 0.0–40.0)

## 2022-01-23 LAB — HEMOGLOBIN A1C: Hgb A1c MFr Bld: 6.8 % — ABNORMAL HIGH (ref 4.6–6.5)

## 2022-01-23 LAB — TSH: TSH: 2.48 u[IU]/mL (ref 0.35–5.50)

## 2022-01-23 NOTE — Patient Instructions (Addendum)
Please schedule Medicare Wellness visit.   ? ? ?Check the  blood pressure regularly ?BP GOAL is between 110/65 and  135/85. ?If it is consistently higher or lower, let me know ? ?  ? ?GO TO THE LAB : Get the blood work   ? ? ?[place], [place] ?Come back for a checkup in 3 months ?

## 2022-01-23 NOTE — Progress Notes (Signed)
? ?Subjective:  ? ? Patient ID: Daniel Cruz, male    DOB: 06-Nov-1950, 71 y.o.   MRN: [id number] ? ?DOS:  01/23/2022 ?Type of visit - description: rov ? ?Chronic medical issues assessed.   ? ?Also we discussed the following: ? ?Recent history of UTI prostatitis, had antibiotics, symptoms are essentially resolved. ?No apparent complications from antibiotics: Denies nausea, diarrhea, rash  ?Also denies dysuria, gross hematuria.  Occasionally has low stream. ? ?HTN: Reports ambulatory BPs okay when she checks. ? ?History of essential tremor, some fine motor activities like shaving or drinking coffee are somewhat affected. ? ?Review of Systems ?See above  ? ?Past Medical History:  ?Diagnosis Date  ? Anemia, iron deficiency   ? hx  ? CAD (coronary artery disease)   ?  NSTEMI with PCI of the mid and distal RCA with 50-60% LAD 09/2000, s/p cath 01/2009 with patent RCA stent and 90% LAD s/p PCI of LAD and right PDA  ? Cholelithiasis   ? DIABETES MELLITUS, TYPE II 02/26/2007  ? Fatty liver 02/2009  ? per u/s  ? GERD (gastroesophageal reflux disease)   ? HTN (hypertension)   ? Myocardial infarction [facility])   ? 2001  ? OSA (obstructive sleep apnea)   ? can't tol. a CPAP  ? Plantar fasciitis of left foot   ? PVC (premature ventricular contraction) 06/06/2017  ? Sleep apnea   ? UTI (lower urinary tract infection)   ? in the 80s and 11/09  ? ? ?Past Surgical History:  ?Procedure Laterality Date  ? CARDIAC CATHETERIZATION  10/2008  ? stents  ? CHOLECYSTECTOMY N/A 07/16/2016  ? Procedure: LAPAROSCOPIC CHOLECYSTECTOMY WITH INTRAOPERATIVE CHOLANGIOGRAM;  Surgeon: [name], MD;  Location: [facility];  Service: General;  Laterality: N/A;  ? CORONARY STENT INTERVENTION N/A 09/20/2019  ? Procedure: CORONARY STENT INTERVENTION;  Surgeon: Burnell Blanks, MD;  Location: [facility];  Service: Cardiovascular;  Laterality: N/A;  ? LEFT HEART CATH AND CORONARY ANGIOGRAPHY N/A 09/20/2019  ? Procedure: LEFT HEART CATH AND CORONARY  ANGIOGRAPHY;  Surgeon: Burnell Blanks, MD;  Location: [facility];  Service: Cardiovascular;  Laterality: N/A;  ? SEPTOPLASTY    ? w/ bilateral inferior turbinate reductions  ? TONSILLECTOMY    ? ? ?Current Outpatient Medications  ?Medication Instructions  ? aspirin EC 81 mg, Oral, Daily at bedtime  ? blood glucose meter kit and supplies Dispense based on patient and insurance preference. Check blood sugar once daily  ? carvedilol (COREG) 6.25 MG tablet TAKE 1 TABLET BY MOUTH 2 TIMES DAILY WITH A MEAL.  ? cetirizine (ZYRTEC) 10 mg, Oral, Daily  ? cholecalciferol (VITAMIN D3) 1,000 Units, Oral, Daily  ? clopidogrel (PLAVIX) 75 MG tablet TAKE 1 TABLET EVERY DAY WITH BREAKFAST  ? dapagliflozin propanediol (FARXIGA) 10 mg, Oral, Daily before breakfast  ? ezetimibe-simvastatin (VYTORIN) 10-10 MG tablet 1 tablet, Oral, Daily  ? fluticasone (FLONASE) 50 MCG/ACT nasal spray 1 spray, Each Nare, 2 times daily  ? glipiZIDE (GLUCOTROL XL) 10 MG 24 hr tablet TAKE 1 TABLET DAILY WITH   BREAKFAST  ? guaiFENesin (MUCINEX) 600 MG 12 hr tablet Oral, 2 times daily  ? isosorbide mononitrate (IMDUR) 30 mg, Oral, Daily  ? linaclotide (LINZESS) 145 mcg, Oral, Daily before breakfast  ? lisinopril (ZESTRIL) 20 mg, Oral, Daily  ? Multiple Vitamin (MULTIVITAMIN) tablet 1 tablet, Oral, Daily,    ? nitroGLYCERIN (NITROSTAT) 0.4 MG SL tablet PLACE 1 TABLET UNDER THE TONGUE EVERY 5 (FIVE)  MINUTES X 3 DOSES AS NEEDED FOR CHEST PAIN.  ? ONETOUCH ULTRA test strip CHECK BLOOD SUGAR 1 DAILY  ? pantoprazole (PROTONIX) 40 MG tablet TAKE 1 TABLET TWICE DAILY  BEFORE MEALS  ? pioglitazone-metformin (ACTOPLUS MET) 15-850 MG tablet TAKE 1 TABLET TWICE DAILY  WITH MEALS  ? polyethylene glycol (MIRALAX / GLYCOLAX) 17 g, Oral, Daily PRN  ? TRADJENTA 5 MG TABS tablet TAKE 1 TABLET DAILY  ? ? ?   ?Objective:  ? Physical Exam ?BP 132/80 (BP Location: Left Arm, Patient Position: Sitting, Cuff Size: Normal)   Pulse (!) 50   Temp 97.8 ?F (36.6 ?C)  (Oral)   Resp 18   Ht $R'5\' 11"'uJ$  (1.803 m)   Wt 249 lb 8 oz (113.2 kg)   SpO2 96%   BMI 34.80 kg/m?  ?General:   ?Well developed, NAD, BMI noted.  ?HEENT:  ?Normocephalic . Face symmetric, atraumatic ?Lungs:  ?CTA B ?Normal respiratory effort, no intercostal retractions, no accessory muscle use. ?Heart: RRR,  no murmur.  ?Abdomen:  ?Not distended, soft, non-tender. No rebound or rigidity.   ?Skin: Not pale. Not jaundice ?Lower extremities: no pretibial edema bilaterally  ?Neurologic:  ?alert & oriented X3.  ?Speech normal, gait appropriate for age and unassisted. ?Minimal tremor noted.   ?Psych--  ?Cognition and judgment appear intact.  ?Cooperative with normal attention span and concentration.  ?Behavior appropriate. ?No anxious or depressed appearing. ? ?   ?Assessment   ? ? Assessment ?DM DX 2008 ?HTN ?Hyperlipidemia ?CAD:  ?-MI 2001, syncope 2010 ?-07-2017: SOB, low risk stress test ?-Catheterization 09/2019: one stent, DAPT for 6 months ?OSA, CPAP intolerant ?ED ?GI/anemia: ?--Mild chronic anemia, low iron stores.  Diabetes, history of anemia ?--Normal colonoscopy 2014, EGD 03/2019: H. pylori negative.   ?--Saw GI 04-2017: no further w/u  ?--Transglutaminase IgA and IgM normal 08-2020 ?--GERD ?--Fatty liver 2010 ?Used to see  Dr Allyson Sabal ?Prostatitis/UTI: recurrent , at some point saw urology. ? ? ?PLAN ?DM: on Farxiga, [place], [place], Tradjenta.  No recent ambulatory CBGs.  Check A1c. ?HTN: BP well controlled, on multiple meds include carvedilol, Imdur, lisinopril.  Check BMP and CBC.  Seems well controlled.  Ambulatory BPs okay when checked. ?Hyperlipidemia: On Vytorin, check FLP. ?CAD: Asymptomatic ?UTI, prostatitis: Dx last month, PSA was 23.8, UCX: Klebsiella.  Was Rx Bactrim for a total of 4 weeks.  He is improved and back to baseline.  (+) h/o previous prostatitis. At some point saw urology.  ?Plan: recheck PSA /DRE in 3 months, consider urology re-eval, UA UCX ?Essential tremor: History of,  affecting mildly some activities, with talk about possible medication including change carvedilol to propranolol, at the end we agreed to reassess regularly as symptoms are not severe.  Check TSH ?RTC 3 months ? ? ?This visit occurred during the SARS-CoV-2 public health emergency.  Safety protocols were in place, including screening questions prior to the visit, additional usage of staff PPE, and extensive cleaning of exam room while observing appropriate contact time as indicated for disinfecting solutions.  ? ?

## 2022-01-23 NOTE — Assessment & Plan Note (Signed)
DM: on Farxiga, [place], [place], Tradjenta.  No recent ambulatory CBGs.  Check A1c. ?HTN: BP well controlled, on multiple meds include carvedilol, Imdur, lisinopril.  Check BMP and CBC.  Seems well controlled.  Ambulatory BPs okay when checked. ?Hyperlipidemia: On Vytorin, check FLP. ?CAD: Asymptomatic ?UTI, prostatitis: Dx last month, PSA was 23.8, UCX: Klebsiella.  Was Rx Bactrim for a total of 4 weeks.  He is improved and back to baseline.  (+) h/o previous prostatitis. At some point saw urology.  ?Plan: recheck PSA /DRE in 3 months, consider urology re-eval, UA UCX ?Essential tremor: History of, affecting mildly some activities, with talk about possible medication including change carvedilol to propranolol, at the end we agreed to reassess regularly as symptoms are not severe.  Check TSH ?RTC 3 months ?

## 2022-01-24 LAB — URINE CULTURE
MICRO NUMBER:: 13284364
Result:: NO GROWTH
SPECIMEN QUALITY:: ADEQUATE

## 2022-01-31 ENCOUNTER — Other Ambulatory Visit: Payer: Self-pay | Admitting: Internal Medicine

## 2022-02-01 ENCOUNTER — Other Ambulatory Visit: Payer: Self-pay

## 2022-02-01 ENCOUNTER — Other Ambulatory Visit: Payer: Self-pay | Admitting: Internal Medicine

## 2022-02-01 MED ORDER — PIOGLITAZONE HCL-METFORMIN HCL 15-850 MG PO TABS
1.0000 | ORAL_TABLET | Freq: Two times a day (BID) | ORAL | 1 refills | Status: DC
Start: 2022-02-01 — End: 2022-07-26

## 2022-02-13 DIAGNOSIS — E119 Type 2 diabetes mellitus without complications: Secondary | ICD-10-CM | POA: Diagnosis not present

## 2022-02-13 DIAGNOSIS — H2513 Age-related nuclear cataract, bilateral: Secondary | ICD-10-CM | POA: Diagnosis not present

## 2022-02-13 DIAGNOSIS — H52223 Regular astigmatism, bilateral: Secondary | ICD-10-CM | POA: Diagnosis not present

## 2022-02-13 DIAGNOSIS — H524 Presbyopia: Secondary | ICD-10-CM | POA: Diagnosis not present

## 2022-02-13 DIAGNOSIS — H5203 Hypermetropia, bilateral: Secondary | ICD-10-CM | POA: Diagnosis not present

## 2022-02-13 DIAGNOSIS — H25013 Cortical age-related cataract, bilateral: Secondary | ICD-10-CM | POA: Diagnosis not present

## 2022-02-13 LAB — HM DIABETES EYE EXAM

## 2022-02-14 ENCOUNTER — Encounter: Payer: Self-pay | Admitting: Internal Medicine

## 2022-03-01 ENCOUNTER — Other Ambulatory Visit: Payer: Self-pay | Admitting: Cardiology

## 2022-03-14 ENCOUNTER — Other Ambulatory Visit: Payer: Self-pay | Admitting: Cardiology

## 2022-03-15 ENCOUNTER — Other Ambulatory Visit: Payer: Self-pay | Admitting: Internal Medicine

## 2022-03-25 ENCOUNTER — Encounter: Payer: Self-pay | Admitting: *Deleted

## 2022-03-27 ENCOUNTER — Other Ambulatory Visit: Payer: Self-pay | Admitting: Cardiology

## 2022-03-27 DIAGNOSIS — Z0100 Encounter for examination of eyes and vision without abnormal findings: Secondary | ICD-10-CM | POA: Diagnosis not present

## 2022-03-28 ENCOUNTER — Other Ambulatory Visit: Payer: Self-pay

## 2022-03-28 MED ORDER — LISINOPRIL 20 MG PO TABS: 20.0000 mg | ORAL_TABLET | Freq: Every day | ORAL | 0 refills | Status: DC

## 2022-04-14 ENCOUNTER — Other Ambulatory Visit: Payer: Self-pay | Admitting: Internal Medicine

## 2022-04-23 DIAGNOSIS — M9904 Segmental and somatic dysfunction of sacral region: Secondary | ICD-10-CM | POA: Diagnosis not present

## 2022-04-23 DIAGNOSIS — M9902 Segmental and somatic dysfunction of thoracic region: Secondary | ICD-10-CM | POA: Diagnosis not present

## 2022-04-23 DIAGNOSIS — M4726 Other spondylosis with radiculopathy, lumbar region: Secondary | ICD-10-CM | POA: Diagnosis not present

## 2022-04-23 DIAGNOSIS — M9903 Segmental and somatic dysfunction of lumbar region: Secondary | ICD-10-CM | POA: Diagnosis not present

## 2022-04-23 DIAGNOSIS — M546 Pain in thoracic spine: Secondary | ICD-10-CM | POA: Diagnosis not present

## 2022-04-24 ENCOUNTER — Encounter: Payer: Self-pay | Admitting: Internal Medicine

## 2022-04-24 ENCOUNTER — Ambulatory Visit (INDEPENDENT_AMBULATORY_CARE_PROVIDER_SITE_OTHER): Payer: Medicare HMO | Admitting: Internal Medicine

## 2022-04-24 VITALS — BP 120/68 | HR 65 | Temp 98.0°F | Resp 18 | Ht 71.0 in | Wt 252.4 lb

## 2022-04-24 DIAGNOSIS — N410 Acute prostatitis: Secondary | ICD-10-CM | POA: Diagnosis not present

## 2022-04-24 DIAGNOSIS — I10 Essential (primary) hypertension: Secondary | ICD-10-CM

## 2022-04-24 DIAGNOSIS — G250 Essential tremor: Secondary | ICD-10-CM

## 2022-04-24 LAB — BASIC METABOLIC PANEL
BUN: 19 mg/dL (ref 6–23)
CO2: 28 mEq/L (ref 19–32)
Calcium: 9.2 mg/dL (ref 8.4–10.5)
Chloride: 102 mEq/L (ref 96–112)
Creatinine, Ser: 0.95 mg/dL (ref 0.40–1.50)
GFR: 79.47 mL/min (ref >60.00)
Glucose, Bld: 99 mg/dL (ref 70–99)
Potassium: 4.5 mEq/L (ref 3.5–5.1)
Sodium: 138 mEq/L (ref 135–145)

## 2022-04-24 LAB — PSA: PSA: 4.27 ng/mL — ABNORMAL HIGH (ref 0.10–4.00)

## 2022-04-24 NOTE — Progress Notes (Signed)
Subjective:    Patient ID: Daniel Cruz, male    DOB: 1951/09/03, 71 y.o.   MRN: [id number]  DOS:  04/24/2022 Type of visit - description: Follow-up  Follow-up from previous visit. Was Dx with prostatitis. Symptoms improved, he eventually felt back to normal ~ 3 weeks ago. Denies nocturia, dysuria or gross hematuria.  Potassium was slightly elevated does not use any salt substitutes.  Review of Systems See above   Past Medical History:  Diagnosis Date   Anemia, iron deficiency    hx   CAD (coronary artery disease)     NSTEMI with PCI of the mid and distal RCA with 50-60% LAD 09/2000, s/p cath 01/2009 with patent RCA stent and 90% LAD s/p PCI of LAD and right PDA   Cholelithiasis    DIABETES MELLITUS, TYPE II 02/26/2007   Fatty liver 02/2009   per u/s   GERD (gastroesophageal reflux disease)    HTN (hypertension)    Myocardial infarction ([place])    2001   OSA (obstructive sleep apnea)    can't tol. a CPAP   Plantar fasciitis of left foot    PVC (premature ventricular contraction) 06/06/2017   Sleep apnea    UTI (lower urinary tract infection)    in the 80s and 11/09    Past Surgical History:  Procedure Laterality Date   CARDIAC CATHETERIZATION  10/2008   stents   CHOLECYSTECTOMY N/A 07/16/2016   Procedure: LAPAROSCOPIC CHOLECYSTECTOMY WITH INTRAOPERATIVE CHOLANGIOGRAM;  Surgeon: [name], MD;  Location: [facility];  Service: General;  Laterality: N/A;   CORONARY STENT INTERVENTION N/A 09/20/2019   Procedure: CORONARY STENT INTERVENTION;  Surgeon: Burnell Blanks, MD;  Location: [facility];  Service: Cardiovascular;  Laterality: N/A;   LEFT HEART CATH AND CORONARY ANGIOGRAPHY N/A 09/20/2019   Procedure: LEFT HEART CATH AND CORONARY ANGIOGRAPHY;  Surgeon: Burnell Blanks, MD;  Location: [facility];  Service: Cardiovascular;  Laterality: N/A;   SEPTOPLASTY     w/ bilateral inferior turbinate reductions   TONSILLECTOMY      Current  Outpatient Medications  Medication Instructions   aspirin EC 81 mg, Oral, Daily at bedtime   blood glucose meter kit and supplies Dispense based on patient and insurance preference. Check blood sugar once daily   carvedilol (COREG) 6.25 MG tablet TAKE 1 TABLET BY MOUTH 2 TIMES DAILY WITH A MEAL.   cetirizine (ZYRTEC) 10 mg, Oral, Daily   cholecalciferol (VITAMIN D3) 1,000 Units, Oral, Daily   clopidogrel (PLAVIX) 75 MG tablet TAKE 1 TABLET EVERY DAY WITH BREAKFAST   ezetimibe-simvastatin (VYTORIN) 10-10 MG tablet TAKE 1 TABLET DAILY   FARXIGA 10 MG TABS tablet TAKE 1 TABLET BY MOUTH DAILY BEFORE BREAKFAST.   fluticasone (FLONASE) 50 MCG/ACT nasal spray 1 spray, Each Nare, 2 times daily   glipiZIDE (GLUCOTROL XL) 10 MG 24 hr tablet TAKE 1 TABLET (10 MG TOTAL) BY MOUTH DAILY WITH BREAKFAST.   guaiFENesin (MUCINEX) 600 MG 12 hr tablet Oral, 2 times daily   isosorbide mononitrate (IMDUR) 30 MG 24 hr tablet TAKE 1 TABLET BY MOUTH EVERY DAY   linaclotide (LINZESS) 145 mcg, Oral, Daily before breakfast   lisinopril (ZESTRIL) 20 mg, Oral, Daily, Please keep upcoming appt. In Aug. With Dr. Radford Pax in order to receive future refills. Thank You.   Multiple Vitamin (MULTIVITAMIN) tablet 1 tablet, Oral, Daily,     nitroGLYCERIN (NITROSTAT) 0.4 MG SL tablet PLACE 1 TABLET UNDER THE TONGUE EVERY 5 (FIVE) MINUTES  X 3 DOSES AS NEEDED FOR CHEST PAIN.   ONETOUCH ULTRA test strip CHECK BLOOD SUGAR 1 DAILY   pantoprazole (PROTONIX) 40 MG tablet TAKE 1 TABLET TWICE DAILY  BEFORE MEALS   pioglitazone-metformin (ACTOPLUS MET) 15-850 MG tablet 1 tablet, Oral, 2 times daily with meals   polyethylene glycol (MIRALAX / GLYCOLAX) 17 g, Oral, Daily PRN   TRADJENTA 5 MG TABS tablet TAKE 1 TABLET DAILY       Objective:   Physical Exam BP 120/68   Pulse 65   Temp 98 F (36.7 C) (Oral)   Resp 18   Ht $R'5\' 11"'jp$  (1.803 m)   Wt 252 lb 6 oz (114.5 kg)   SpO2 94%   BMI 35.20 kg/m  General:   Well developed, NAD, BMI  noted. HEENT:  Normocephalic . Face symmetric, atraumatic Lungs:  CTA B Normal respiratory effort, no intercostal retractions, no accessory muscle use. Heart: RRR,  no murmur.  Lower extremities: no pretibial edema bilaterally  Skin: Not pale. Not jaundice DRE: Normal sphincter tone, no stools, prostate symmetric, not nodular.  Noted Neurologic:  alert & oriented X3.  Speech normal, gait appropriate for age and unassisted Psych--  Cognition and judgment appear intact.  Cooperative with normal attention span and concentration.  Behavior appropriate. No anxious or depressed appearing.      Assessment     Assessment DM DX 2008 HTN Hyperlipidemia CAD:  -MI 2001, syncope 2010 -07-2017: SOB, low risk stress test -Catheterization 09/2019: one stent, DAPT for 6 months OSA, CPAP intolerant ED GI/anemia: --Mild chronic anemia, low iron stores.  Diabetes, history of anemia --Normal colonoscopy 2014, EGD 03/2019: H. pylori negative.   --Saw GI 04-2017: no further w/u  --Transglutaminase IgA and IgM normal 08-2020 --GERD --Fatty liver 2010 Used to see  Dr Allyson Sabal Prostatitis/UTI: recurrent , at some point saw urology.   PLAN UTI, prostatitis: At the last visit, UA urine culture negative, currently asymptomatic, DRE today negative.  Check PSA. HTN: Well-controlled, potassium was slightly elevated, advised low K+ diet, recheck DM: Last A1c was 6.8. High cholesterol: Last LDL 44, well controlled, continue Vytorin Essential tremors: At this point stable.  No need to change medication.  Last TSH normal. Preventive care: Rec COVID-vaccine and a  Flu shot q. fall.   RTC 4 months CPX

## 2022-04-24 NOTE — Patient Instructions (Addendum)
Recommend to proceed with covid booster (bivalent) at your pharmacy. Flu shot this fall.   Please follow a low potassium diet Avoid over-the-counter potassium supplements Avoid salt substitutes Very good information is found at this website [email or web address]   Check the  blood pressure regularly BP GOAL is between 110/65 and  135/85. If it is consistently higher or lower, let me know      GO TO THE LAB : Get the blood work     [place], [place] back for   a CPX in 4 months

## 2022-04-24 NOTE — Assessment & Plan Note (Signed)
UTI, prostatitis: At the last visit, UA urine culture negative, currently asymptomatic, DRE today negative.  Check PSA. HTN: Well-controlled, potassium was slightly elevated, advised low K+ diet, recheck DM: Last A1c was 6.8. High cholesterol: Last LDL 44, well controlled, continue Vytorin Essential tremors: At this point stable.  No need to change medication.  Last TSH normal. Preventive care: Rec COVID-vaccine and a  Flu shot q. fall.   RTC 4 months CPX

## 2022-04-26 DIAGNOSIS — M9903 Segmental and somatic dysfunction of lumbar region: Secondary | ICD-10-CM | POA: Diagnosis not present

## 2022-04-26 DIAGNOSIS — M9904 Segmental and somatic dysfunction of sacral region: Secondary | ICD-10-CM | POA: Diagnosis not present

## 2022-04-26 DIAGNOSIS — M4726 Other spondylosis with radiculopathy, lumbar region: Secondary | ICD-10-CM | POA: Diagnosis not present

## 2022-04-26 DIAGNOSIS — M546 Pain in thoracic spine: Secondary | ICD-10-CM | POA: Diagnosis not present

## 2022-04-26 DIAGNOSIS — M9902 Segmental and somatic dysfunction of thoracic region: Secondary | ICD-10-CM | POA: Diagnosis not present

## 2022-04-29 DIAGNOSIS — M9902 Segmental and somatic dysfunction of thoracic region: Secondary | ICD-10-CM | POA: Diagnosis not present

## 2022-04-29 DIAGNOSIS — M9904 Segmental and somatic dysfunction of sacral region: Secondary | ICD-10-CM | POA: Diagnosis not present

## 2022-04-29 DIAGNOSIS — M9903 Segmental and somatic dysfunction of lumbar region: Secondary | ICD-10-CM | POA: Diagnosis not present

## 2022-04-29 DIAGNOSIS — M4726 Other spondylosis with radiculopathy, lumbar region: Secondary | ICD-10-CM | POA: Diagnosis not present

## 2022-04-29 DIAGNOSIS — M546 Pain in thoracic spine: Secondary | ICD-10-CM | POA: Diagnosis not present

## 2022-05-03 DIAGNOSIS — M4726 Other spondylosis with radiculopathy, lumbar region: Secondary | ICD-10-CM | POA: Diagnosis not present

## 2022-05-03 DIAGNOSIS — M546 Pain in thoracic spine: Secondary | ICD-10-CM | POA: Diagnosis not present

## 2022-05-03 DIAGNOSIS — M9904 Segmental and somatic dysfunction of sacral region: Secondary | ICD-10-CM | POA: Diagnosis not present

## 2022-05-03 DIAGNOSIS — M9903 Segmental and somatic dysfunction of lumbar region: Secondary | ICD-10-CM | POA: Diagnosis not present

## 2022-05-03 DIAGNOSIS — M9902 Segmental and somatic dysfunction of thoracic region: Secondary | ICD-10-CM | POA: Diagnosis not present

## 2022-05-06 DIAGNOSIS — M9904 Segmental and somatic dysfunction of sacral region: Secondary | ICD-10-CM | POA: Diagnosis not present

## 2022-05-06 DIAGNOSIS — M546 Pain in thoracic spine: Secondary | ICD-10-CM | POA: Diagnosis not present

## 2022-05-06 DIAGNOSIS — M4726 Other spondylosis with radiculopathy, lumbar region: Secondary | ICD-10-CM | POA: Diagnosis not present

## 2022-05-06 DIAGNOSIS — M9903 Segmental and somatic dysfunction of lumbar region: Secondary | ICD-10-CM | POA: Diagnosis not present

## 2022-05-06 DIAGNOSIS — M9902 Segmental and somatic dysfunction of thoracic region: Secondary | ICD-10-CM | POA: Diagnosis not present

## 2022-05-08 DIAGNOSIS — M9902 Segmental and somatic dysfunction of thoracic region: Secondary | ICD-10-CM | POA: Diagnosis not present

## 2022-05-08 DIAGNOSIS — M9903 Segmental and somatic dysfunction of lumbar region: Secondary | ICD-10-CM | POA: Diagnosis not present

## 2022-05-08 DIAGNOSIS — M546 Pain in thoracic spine: Secondary | ICD-10-CM | POA: Diagnosis not present

## 2022-05-08 DIAGNOSIS — M9904 Segmental and somatic dysfunction of sacral region: Secondary | ICD-10-CM | POA: Diagnosis not present

## 2022-05-08 DIAGNOSIS — M4726 Other spondylosis with radiculopathy, lumbar region: Secondary | ICD-10-CM | POA: Diagnosis not present

## 2022-05-10 ENCOUNTER — Encounter: Payer: Self-pay | Admitting: Internal Medicine

## 2022-05-10 DIAGNOSIS — M546 Pain in thoracic spine: Secondary | ICD-10-CM | POA: Diagnosis not present

## 2022-05-10 DIAGNOSIS — M9902 Segmental and somatic dysfunction of thoracic region: Secondary | ICD-10-CM | POA: Diagnosis not present

## 2022-05-10 DIAGNOSIS — M9904 Segmental and somatic dysfunction of sacral region: Secondary | ICD-10-CM | POA: Diagnosis not present

## 2022-05-10 DIAGNOSIS — M9903 Segmental and somatic dysfunction of lumbar region: Secondary | ICD-10-CM | POA: Diagnosis not present

## 2022-05-10 DIAGNOSIS — M4726 Other spondylosis with radiculopathy, lumbar region: Secondary | ICD-10-CM | POA: Diagnosis not present

## 2022-05-15 DIAGNOSIS — M9904 Segmental and somatic dysfunction of sacral region: Secondary | ICD-10-CM | POA: Diagnosis not present

## 2022-05-15 DIAGNOSIS — M4726 Other spondylosis with radiculopathy, lumbar region: Secondary | ICD-10-CM | POA: Diagnosis not present

## 2022-05-15 DIAGNOSIS — M546 Pain in thoracic spine: Secondary | ICD-10-CM | POA: Diagnosis not present

## 2022-05-15 DIAGNOSIS — M9903 Segmental and somatic dysfunction of lumbar region: Secondary | ICD-10-CM | POA: Diagnosis not present

## 2022-05-15 DIAGNOSIS — M9902 Segmental and somatic dysfunction of thoracic region: Secondary | ICD-10-CM | POA: Diagnosis not present

## 2022-05-16 ENCOUNTER — Other Ambulatory Visit: Payer: Self-pay | Admitting: Cardiology

## 2022-05-21 DIAGNOSIS — M9903 Segmental and somatic dysfunction of lumbar region: Secondary | ICD-10-CM | POA: Diagnosis not present

## 2022-05-21 DIAGNOSIS — M9902 Segmental and somatic dysfunction of thoracic region: Secondary | ICD-10-CM | POA: Diagnosis not present

## 2022-05-21 DIAGNOSIS — M9904 Segmental and somatic dysfunction of sacral region: Secondary | ICD-10-CM | POA: Diagnosis not present

## 2022-05-21 DIAGNOSIS — M546 Pain in thoracic spine: Secondary | ICD-10-CM | POA: Diagnosis not present

## 2022-05-21 DIAGNOSIS — M4726 Other spondylosis with radiculopathy, lumbar region: Secondary | ICD-10-CM | POA: Diagnosis not present

## 2022-05-22 ENCOUNTER — Encounter: Payer: Self-pay | Admitting: Cardiology

## 2022-05-22 ENCOUNTER — Ambulatory Visit: Payer: Medicare HMO | Admitting: Cardiology

## 2022-05-22 VITALS — BP 108/72 | HR 65 | Ht 71.0 in | Wt 250.0 lb

## 2022-05-22 DIAGNOSIS — E7800 Pure hypercholesterolemia, unspecified: Secondary | ICD-10-CM

## 2022-05-22 DIAGNOSIS — I10 Essential (primary) hypertension: Secondary | ICD-10-CM

## 2022-05-22 DIAGNOSIS — I2510 Atherosclerotic heart disease of native coronary artery without angina pectoris: Secondary | ICD-10-CM

## 2022-05-22 MED ORDER — LISINOPRIL 20 MG PO TABS: 20.0000 mg | ORAL_TABLET | Freq: Every day | ORAL | 3 refills | Status: DC

## 2022-05-22 MED ORDER — ISOSORBIDE MONONITRATE ER 30 MG PO TB24: 30.0000 mg | ORAL_TABLET | Freq: Every day | ORAL | 3 refills | Status: DC

## 2022-05-22 NOTE — Patient Instructions (Signed)
Medication Instructions:  Your physician has recommended you make the following change in your medication:  1) STOP taking aspirin  *If you need a refill on your cardiac medications before your next appointment, please call your pharmacy*  Follow-Up: At [facility], you and your health needs are our priority.  As part of our continuing mission to provide you with exceptional heart care, we have created designated Provider Care Teams.  These Care Teams include your primary Cardiologist (physician) and Advanced Practice Providers (APPs -  Physician Assistants and Nurse Practitioners) who all work together to provide you with the care you need, when you need it.  Your next appointment:   1 year(s)  The format for your next appointment:   In Person  Provider:   Fransico Him, MD    Important Information About Sugar

## 2022-05-22 NOTE — Progress Notes (Signed)
Cardiology Office Note:    Date:  05/22/2022   ID:  Daniel Cruz, DOB May 13, 1951, MRN [id number]  PCP:  Daniel Branch, MD  Cardiologist:  Daniel Him, MD    Referring MD: Daniel Branch, MD   Chief Complaint  Patient presents with   Coronary Artery Disease   Hyperlipidemia   Hypertension    History of Present Illness:    Daniel Cruz is a 71 y.o. male with a hx of ASCAD with NSTEMI 2001 with PCI of the mid and distal RCA with 50-60% prox LAD and subsequent PCI of 80% LAD and right PDA 01/2009, diabetes, hypertension, hyperlipidemia, obstructive sleep apnea and GERD.    In 08/2019 he started having DOE with walking and stress myoview showed scarring in the distal LAD territory that was larger than prior studies.  Cardiac cath was done showing patent stents in the p/mRCA with mild to moderate restenosis that was not flow-limiting, CTO of the mLAD with collaterals, along with new severe stenosis in the intermediate Cruz.  Successful PCI/DES x1 was placed.  Plan was for DAPT with ASA and Plavix for at least 6 months.  Noted if he continued to have dyspnea, could be considered for CTO PCI of the LAD.  Nuclear stress test on 03/13/2021 showed low normal LV function with EF 20% with findings consistent with prior MI and peri-infarct ischemia in the septum but actually had improved from his prior scan.  He was having stomach issues at the same time from allergies and drainage into his stomach and that all resolved and he has not had any other symptoms.   He is here today for followup and is doing well.  He denies any chest pain or pressure, SOB, DOE, PND, orthopnea, LE edema, dizziness, palpitations or syncope. He is compliant with his meds and is tolerating meds with no SE.     Past Medical History:  Diagnosis Date   Anemia, iron deficiency    hx   CAD (coronary artery disease)     NSTEMI with PCI of the mid and distal RCA with 50-60% LAD 09/2000, s/p cath 01/2009 with patent RCA stent  and 90% LAD s/p PCI of LAD and right PDA   Cholelithiasis    DIABETES MELLITUS, TYPE II 02/26/2007   Fatty liver 02/2009   per u/s   GERD (gastroesophageal reflux disease)    HTN (hypertension)    Myocardial infarction ([place])    2001   OSA (obstructive sleep apnea)    can't tol. a CPAP   Plantar fasciitis of left foot    PVC (premature ventricular contraction) 06/06/2017   Sleep apnea    UTI (lower urinary tract infection)    in the 80s and 11/09    Past Surgical History:  Procedure Laterality Date   CARDIAC CATHETERIZATION  10/2008   stents   CHOLECYSTECTOMY N/A 07/16/2016   Procedure: LAPAROSCOPIC CHOLECYSTECTOMY WITH INTRAOPERATIVE CHOLANGIOGRAM;  Surgeon: [name], MD;  Location: [place];  Service: General;  Laterality: N/A;   CORONARY STENT INTERVENTION N/A 09/20/2019   Procedure: CORONARY STENT INTERVENTION;  Surgeon: Burnell Blanks, MD;  Location: [facility];  Service: Cardiovascular;  Laterality: N/A;   LEFT HEART CATH AND CORONARY ANGIOGRAPHY N/A 09/20/2019   Procedure: LEFT HEART CATH AND CORONARY ANGIOGRAPHY;  Surgeon: Burnell Blanks, MD;  Location: [facility];  Service: Cardiovascular;  Laterality: N/A;   SEPTOPLASTY     w/ bilateral inferior turbinate reductions  TONSILLECTOMY      Current Medications: Current Meds  Medication Sig   aspirin EC 81 MG tablet Take 81 mg by mouth at bedtime.    carvedilol (COREG) 6.25 MG tablet TAKE 1 TABLET BY MOUTH 2 TIMES DAILY WITH A MEAL.   cetirizine (ZYRTEC) 10 MG tablet Take 10 mg by mouth daily.   cholecalciferol (VITAMIN D3) 25 MCG (1000 UNIT) tablet Take 1,000 Units by mouth daily.   clopidogrel (PLAVIX) 75 MG tablet TAKE 1 TABLET EVERY DAY WITH BREAKFAST   ezetimibe-simvastatin (VYTORIN) 10-10 MG tablet TAKE 1 TABLET DAILY   FARXIGA 10 MG TABS tablet TAKE 1 TABLET BY MOUTH DAILY BEFORE BREAKFAST.   fluticasone (FLONASE) 50 MCG/ACT nasal spray Place 1 spray into both nostrils 2 (two)  times daily.   glipiZIDE (GLUCOTROL XL) 10 MG 24 hr tablet TAKE 1 TABLET (10 MG TOTAL) BY MOUTH DAILY WITH BREAKFAST.   guaiFENesin (MUCINEX) 600 MG 12 hr tablet Take 600 mg by mouth 2 (two) times daily as needed.   isosorbide mononitrate (IMDUR) 30 MG 24 hr tablet TAKE 1 TABLET BY MOUTH EVERY DAY   lisinopril (ZESTRIL) 20 MG tablet Take 1 tablet (20 mg total) by mouth daily. Please keep upcoming appt. In Aug. With Dr. Radford Pax in order to receive future refills. Thank You.   Multiple Vitamin (MULTIVITAMIN) tablet Take 1 tablet by mouth daily.   nitroGLYCERIN (NITROSTAT) 0.4 MG SL tablet PLACE 1 TABLET UNDER THE TONGUE EVERY 5 (FIVE) MINUTES X 3 DOSES AS NEEDED FOR CHEST PAIN.   pantoprazole (PROTONIX) 40 MG tablet TAKE 1 TABLET TWICE DAILY  BEFORE MEALS   pioglitazone-metformin (ACTOPLUS MET) 15-850 MG tablet Take 1 tablet by mouth 2 (two) times daily with a meal.   polyethylene glycol (MIRALAX / GLYCOLAX) 17 g packet Take 17 g by mouth daily as needed.   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, production at [employer]  Tobacco Use   Smoking status: Former    Packs/day: 1.50    Years: 16.00    Total pack years: 24.00    Types: Cigarettes    Quit date: 07/08/2000    Years since quitting: 21.8   Smokeless tobacco: Never   Tobacco comments:    2001  Vaping Use   Vaping Use: Never used  Substance and Sexual Activity   Alcohol use: Yes    Comment: socially   Drug use: No   Sexual activity: Yes  Other Topics Concern   Not on file  Social History Narrative   Lives in [place], [place]   Lives w/ wife, 2 children, 2 g-kids   Retired April 2017          Social  Determinants of Health   Financial Resource Strain: [place]  (03/16/2020)   Overall Financial Resource Strain (CARDIA)    Difficulty of Paying Living Expenses: Not hard at all  Food Insecurity: No Food Insecurity (03/16/2020)   Hunger Vital Sign    Worried About Running Out of Food in the Last Year: Never true    [place] in the Last Year: Never  true  Transportation Needs: No Transportation Needs (03/16/2020)   PRAPARE - Hydrologist (Medical): No    Lack of Transportation (Non-Medical): No  Physical Activity: Insufficiently Active (03/16/2020)   Exercise Vital Sign    Days of Exercise per Week: 3 days    Minutes of Exercise per Session: 30 min  Stress: No Stress Concern Present (03/16/2020)   [place]    Feeling of Stress : Not at all  Social Connections: Moderately Isolated (03/16/2020)   Social Connection and Isolation Panel [NHANES]    Frequency of Communication with Friends and Family: Three times a week    Frequency of Social Gatherings with Friends and Family: More than three times a week    Attends Religious Services: Never    Marine scientist or Organizations: No    Attends Music therapist: Never    Marital Status: Married     Family History: The patient's family history includes COPD in his mother; Coronary artery disease in his father; Emphysema in his mother; Stroke in his father. There is no history of Cancer, Diabetes, Daniel cancer, Esophageal cancer, Rectal cancer, Stomach cancer, or Prostate cancer.  ROS:   Please see the history of present illness.    Review of Systems  Skin:  Negative for unusual hair distribution.    All other systems reviewed and negative.   EKGs/Labs/Other Studies Reviewed:    The following studies were reviewed today:  09/2019 Cardiac Cath: Conclusion    Prox RCA lesion is 40% stenosed. RPDA lesion is 30%  stenosed. Ramus lesion is 80% stenosed. Mid Cx to Dist Cx lesion is 30% stenosed with 30% stenosed side Cruz in 2nd Mrg. Mid LAD-1 lesion is 100% stenosed. Mid LAD-2 lesion is 100% stenosed. A drug-eluting stent was successfully placed using a STENT RESOLUTE ONYX [id number]. Post intervention, there is a 0% residual stenosis. The left ventricular systolic function is normal. LV end diastolic pressure is normal. The left ventricular ejection fraction is 55-65% by visual estimate. There is no mitral valve regurgitation.   1. Patent stents proximal and mid RCA with mild to moderate restenosis which does not appear to be flow limiting.  2. Chronic occlusion of the mid LAD. The mid and distal LAD fills from right to left collaterals.  3. Mild non-obstructive disease in the Circumflex artery 4. Severe stenosis in the moderate caliber intermediate Cruz.  5. Preserved LV systolic function 6. Successful PTCA/DES x 1 intermediate Cruz   Recommendations: Continue ASA and Plavix for at least 6 months. If he has continued dyspnea, could consider CTO PCI of the LAD. If this is the case, he would need to have his cath films reviewed by the CTO team. Same day PCI discharge.    PAP compliance downlaod   EKG:  EKG is ordered today and showed NSR with PAC and LAD  Recent Labs: 06/20/2021: ALT 18 01/23/2022: Hemoglobin 13.2; Platelets 184.0; TSH 2.48 04/24/2022: BUN 19; Creatinine, Ser 0.95; Potassium 4.5; Sodium 138   Recent Lipid Panel    Component Value Date/Time   CHOL 104 01/23/2022 0914   TRIG 98.0 01/23/2022 0914   TRIG 106 09/15/2006 0947   HDL 40.70 01/23/2022 0914   CHOLHDL 3 01/23/2022 0914   VLDL 19.6 01/23/2022 0914   LDLCALC 44 01/23/2022 0914    Physical Exam:    VS:  BP 108/72   Pulse 65   Ht 5' 11" (1.803  m)   Wt 250 lb (113.4 kg)   BMI 34.87 kg/m     Wt Readings from Last 3 Encounters:  05/22/22 250 lb (113.4 kg)  04/24/22 252 lb 6 oz (114.5 kg)  01/23/22 249 lb 8  oz (113.2 kg)    GEN: Well nourished, well developed in no acute distress HEENT: Normal NECK: No JVD; No carotid bruits LYMPHATICS: No lymphadenopathy CARDIAC:RRR, no murmurs, rubs, gallops RESPIRATORY:  Clear to auscultation without rales, wheezing or rhonchi  ABDOMEN: Soft, non-tender, non-distended MUSCULOSKELETAL:  No edema; No deformity  SKIN: Warm and dry NEUROLOGIC:  Alert and oriented x 3 PSYCHIATRIC:  Normal affect  ASSESSMENT:    1. Coronary artery disease involving native coronary artery of native heart without angina pectoris   2. Primary hypertension   3. Pure hypercholesterolemia    PLAN:    In order of problems listed above:  1.  ASCAD -s/p NSTEMI 2001 with PCI of the mid and distal RCA with 50-60% prox LAD and subsequent PCI of 80% LAD and right PDA. -cath done 09/2019 showed patent stents in the p/mRCA with mild to moderate restenosis that was not flow-limiting, CTO of the mLAD with collaterals, along with new severe stenosis in the intermediate Cruz s/p PCI/DES x1 was placed.  - Plan was for DAPT with ASA and Plavix for at least 6 months with consideration of CTO PCI of the LAD if  -Nuclear stress test a year ago in the setting of shortness of breath and chest pain showed very mild peri-infarct ischemia in the anterior septum but scan was improved from prior scan>>resolved after treating for allergies and drainage into his stomach -He has not had any further chest pain or shortness of breath -he is having a lot of bruising so I have told Cruz it is ok to stop the ASA and continue on Plavix -Continue prescription drug management with Plavix 75 mg daily, carvedilol 6.25 mg twice daily, Imdur 30 mg daily and Vytorin 10/10 mg daily with as needed refills  2.  HTN -BP is adequate controlled on exam today -Prescription drug management with lisinopril 20 mg daily, Imdur 30 mg daily and carvedilol 6.25 mg daily with as needed refills -I have personally reviewed and  interpreted outside labs performed by patient's PCP which showed serum creatinine 0.95 and potassium 4.5 on 04/24/2022  3.  HLD -LDL goal is < 70 -I have personally reviewed and interpreted outside Labs performed by patient's PCP which showed LDL 44 and HDL 48 on 01/23/2022 -Continue prescription drug management with Vytorin 10/10 mg daily with as needed refills  Medication Adjustments/Labs and Tests Ordered: Current medicines are reviewed at length with the patient today.  Concerns regarding medicines are outlined above.  Orders Placed This Encounter  Procedures   EKG 12-Lead   No orders of the defined types were placed in this encounter.   Signed, Daniel Him, MD  05/22/2022 3:57 PM    [place]

## 2022-05-22 NOTE — Addendum Note (Signed)
Addended by: Antonieta Iba on: 05/22/2022 04:07 PM   Modules accepted: Orders

## 2022-05-22 NOTE — Addendum Note (Signed)
Addended by: Antonieta Iba on: 05/22/2022 04:50 PM   Modules accepted: Orders

## 2022-05-23 DIAGNOSIS — M9902 Segmental and somatic dysfunction of thoracic region: Secondary | ICD-10-CM | POA: Diagnosis not present

## 2022-05-23 DIAGNOSIS — M9904 Segmental and somatic dysfunction of sacral region: Secondary | ICD-10-CM | POA: Diagnosis not present

## 2022-05-23 DIAGNOSIS — M9903 Segmental and somatic dysfunction of lumbar region: Secondary | ICD-10-CM | POA: Diagnosis not present

## 2022-05-23 DIAGNOSIS — M4726 Other spondylosis with radiculopathy, lumbar region: Secondary | ICD-10-CM | POA: Diagnosis not present

## 2022-05-23 DIAGNOSIS — M546 Pain in thoracic spine: Secondary | ICD-10-CM | POA: Diagnosis not present

## 2022-05-29 ENCOUNTER — Ambulatory Visit: Payer: Self-pay | Admitting: *Deleted

## 2022-05-29 ENCOUNTER — Encounter: Payer: Self-pay | Admitting: *Deleted

## 2022-05-29 DIAGNOSIS — M9904 Segmental and somatic dysfunction of sacral region: Secondary | ICD-10-CM | POA: Diagnosis not present

## 2022-05-29 DIAGNOSIS — M9902 Segmental and somatic dysfunction of thoracic region: Secondary | ICD-10-CM | POA: Diagnosis not present

## 2022-05-29 DIAGNOSIS — M4726 Other spondylosis with radiculopathy, lumbar region: Secondary | ICD-10-CM | POA: Diagnosis not present

## 2022-05-29 DIAGNOSIS — M546 Pain in thoracic spine: Secondary | ICD-10-CM | POA: Diagnosis not present

## 2022-05-29 DIAGNOSIS — M9903 Segmental and somatic dysfunction of lumbar region: Secondary | ICD-10-CM | POA: Diagnosis not present

## 2022-05-29 NOTE — Patient Outreach (Signed)
  Care Coordination   Initial Visit Note   05/29/2022 Name: Daniel Cruz MRN: [id number] DOB: 04/30/1951  Daniel Cruz is a 71 y.o. year old male who sees Larose Kells, Alda Berthold, MD for primary care. I spoke with  Daniel Cruz by phone today  What matters to the patients health and wellness today?  "I am doing great; I see Dr. Larose Kells every quarter to touch base on my health; so I don't really see the need to have an annual wellness exam; I would like to know when to get the next and new COVID vaccine, my last booster was about 2 or 3 months ago"    Goals Addressed             This Visit's Progress    COMPLETED: Care Coordination Activities- no follow up required   On track    Care Coordination Interventions: Evaluation of current treatment plan related to DMII; HLD and patient's adherence to plan as established by provider Advised patient to contact outpatient pharmacy to determine when new COVID vaccine is available and determine when he should get it (reports had last booster 2-3 months ago)  Reviewed scheduled/upcoming provider appointments including 09/17/22 at 9:20 am/ PCP Advised patient to discuss lab results over time for K+ and A1-C with provider Assessed social determinant of health barriers Confirmed patient does not wish to schedule Medicare Annual Wellness Visit and does not wish to check blood sugars at home: he reports he will discuss with PCP as indicated in coming months, as he has quarterly visits with PCP         SDOH assessments and interventions completed:  Yes  SDOH Interventions Today    Flowsheet Row Most Recent Value  SDOH Interventions   Food Insecurity Interventions Intervention Not Indicated  Financial Strain Interventions Intervention Not Indicated  Transportation Interventions Intervention Not Indicated  [drives self]      Care Coordination Interventions Activated:  Yes  Care Coordination Interventions:  Yes, provided education around  [employer] Visit; process to determine timing of next COVID booster vaccine   Follow up plan: No further intervention required.   Encounter Outcome:  Pt. Visit Completed   Oneta Rack, RN, BSN, [facility] [phone]: direct office

## 2022-05-29 NOTE — Patient Instructions (Signed)
Visit Information  Thank you for taking time to visit with me today. Please don't hesitate to contact me if I can be of assistance to you.   Following are the goals we discussed today:   Goals Addressed             This Visit's Progress    COMPLETED: Care Coordination Activities- no follow up required   On track    Care Coordination Interventions: Evaluation of current treatment plan related to DMII; HLD and patient's adherence to plan as established by provider Advised patient to contact outpatient pharmacy to determine when new COVID vaccine is available and determine when he should get it (reports had last booster 2-3 months ago)  Reviewed scheduled/upcoming provider appointments including 09/17/22 at 9:20 am/ PCP Advised patient to discuss lab results over time for K+ and A1-C with provider Assessed social determinant of health barriers Confirmed patient does not wish to schedule Medicare Annual Wellness Visit and does not wish to check blood sugars at home: he reports he will discuss with PCP as indicated in coming months, as he has quarterly visits with PCP         If you are experiencing a Mental Health or [place] or need someone to talk to, please  call the Suicide and Crisis Lifeline: 988 call the [place] National Suicide Prevention Lifeline: 956-785-4926 or TTY: (212)743-6961 TTY (308)597-5547) to talk to a trained counselor call 1-800-273-TALK (toll free, 24 hour hotline) go to [facility] [address] [place] [phone]) call the [place]: [phone] call 911   Patient verbalizes understanding of instructions and care plan provided today and agrees to view in [place]. Active MyChart status and patient understanding of how to access instructions and care plan via MyChart confirmed with patient.     No further follow up required: visit completed; patient denies care coordination needs/ need  for further outreach/ follow up  Oneta Rack, RN, BSN, [place] [phone]: direct office

## 2022-06-03 DIAGNOSIS — M9904 Segmental and somatic dysfunction of sacral region: Secondary | ICD-10-CM | POA: Diagnosis not present

## 2022-06-03 DIAGNOSIS — M9903 Segmental and somatic dysfunction of lumbar region: Secondary | ICD-10-CM | POA: Diagnosis not present

## 2022-06-03 DIAGNOSIS — M9902 Segmental and somatic dysfunction of thoracic region: Secondary | ICD-10-CM | POA: Diagnosis not present

## 2022-06-03 DIAGNOSIS — M546 Pain in thoracic spine: Secondary | ICD-10-CM | POA: Diagnosis not present

## 2022-06-03 DIAGNOSIS — M4726 Other spondylosis with radiculopathy, lumbar region: Secondary | ICD-10-CM | POA: Diagnosis not present

## 2022-06-09 ENCOUNTER — Other Ambulatory Visit: Payer: Self-pay | Admitting: Internal Medicine

## 2022-06-12 DIAGNOSIS — M9903 Segmental and somatic dysfunction of lumbar region: Secondary | ICD-10-CM | POA: Diagnosis not present

## 2022-06-12 DIAGNOSIS — M546 Pain in thoracic spine: Secondary | ICD-10-CM | POA: Diagnosis not present

## 2022-06-12 DIAGNOSIS — M9902 Segmental and somatic dysfunction of thoracic region: Secondary | ICD-10-CM | POA: Diagnosis not present

## 2022-06-12 DIAGNOSIS — M4726 Other spondylosis with radiculopathy, lumbar region: Secondary | ICD-10-CM | POA: Diagnosis not present

## 2022-06-12 DIAGNOSIS — M9904 Segmental and somatic dysfunction of sacral region: Secondary | ICD-10-CM | POA: Diagnosis not present

## 2022-06-26 DIAGNOSIS — M9904 Segmental and somatic dysfunction of sacral region: Secondary | ICD-10-CM | POA: Diagnosis not present

## 2022-06-26 DIAGNOSIS — M9903 Segmental and somatic dysfunction of lumbar region: Secondary | ICD-10-CM | POA: Diagnosis not present

## 2022-06-26 DIAGNOSIS — M4726 Other spondylosis with radiculopathy, lumbar region: Secondary | ICD-10-CM | POA: Diagnosis not present

## 2022-06-26 DIAGNOSIS — M546 Pain in thoracic spine: Secondary | ICD-10-CM | POA: Diagnosis not present

## 2022-06-26 DIAGNOSIS — M9902 Segmental and somatic dysfunction of thoracic region: Secondary | ICD-10-CM | POA: Diagnosis not present

## 2022-07-13 ENCOUNTER — Other Ambulatory Visit: Payer: Self-pay | Admitting: Internal Medicine

## 2022-07-17 ENCOUNTER — Other Ambulatory Visit: Payer: Self-pay | Admitting: Cardiology

## 2022-07-26 ENCOUNTER — Other Ambulatory Visit: Payer: Self-pay | Admitting: Internal Medicine

## 2022-08-08 DIAGNOSIS — J1189 Influenza due to unidentified influenza virus with other manifestations: Secondary | ICD-10-CM | POA: Diagnosis not present

## 2022-08-28 ENCOUNTER — Telehealth: Payer: Self-pay | Admitting: Pharmacist

## 2022-08-28 NOTE — Telephone Encounter (Signed)
Patient is noted to have CAD with history of stent in 09/2019.  He has been taking ezetimibe / simvastatin 10/'10mg'$  daily for several years (at least since 2008 and prior to stent).  Last LDL was 44 but given history of CAD and blockage consider taking higher dose statin.  Cardio discussed with patient at discharge 09/2019 and patient was considering. Also discussed again with cardio 2021.  Discussed with patient and he is open to recommendations with PCP or cardio. He will see Dr Larose Kells in December.  Possible options:  Increase ezetimibe/simvastatin to 10/'20mg'$  daily Switch to statin with evidence of plaque regression - rosuvastatin 10 - '20mg'$  or atorvastatin '40mg'$    Also reviewed length of clopidogrel therapy. Initially patient was to continue aspirin '81mg'$  indefinitely and clopidogrel x 6 months after stent. However, as noted in cardio visit 05/2022 patient experienced bruising so Dr Radford Pax recommended stopping aspirin and continuing clopidogrel '75mg'$  daily.

## 2022-08-30 ENCOUNTER — Other Ambulatory Visit: Payer: Self-pay | Admitting: Internal Medicine

## 2022-09-17 ENCOUNTER — Encounter: Payer: Self-pay | Admitting: Internal Medicine

## 2022-09-17 ENCOUNTER — Ambulatory Visit (INDEPENDENT_AMBULATORY_CARE_PROVIDER_SITE_OTHER): Payer: Medicare HMO | Admitting: Internal Medicine

## 2022-09-17 VITALS — BP 126/64 | HR 57 | Temp 97.5°F | Resp 18 | Ht 71.0 in | Wt 252.4 lb

## 2022-09-17 DIAGNOSIS — Z0000 Encounter for general adult medical examination without abnormal findings: Secondary | ICD-10-CM

## 2022-09-17 DIAGNOSIS — M8580 Other specified disorders of bone density and structure, unspecified site: Secondary | ICD-10-CM

## 2022-09-17 DIAGNOSIS — E7800 Pure hypercholesterolemia, unspecified: Secondary | ICD-10-CM | POA: Diagnosis not present

## 2022-09-17 DIAGNOSIS — I10 Essential (primary) hypertension: Secondary | ICD-10-CM | POA: Diagnosis not present

## 2022-09-17 DIAGNOSIS — Z23 Encounter for immunization: Secondary | ICD-10-CM | POA: Diagnosis not present

## 2022-09-17 DIAGNOSIS — R7989 Other specified abnormal findings of blood chemistry: Secondary | ICD-10-CM

## 2022-09-17 DIAGNOSIS — E118 Type 2 diabetes mellitus with unspecified complications: Secondary | ICD-10-CM

## 2022-09-17 DIAGNOSIS — N410 Acute prostatitis: Secondary | ICD-10-CM

## 2022-09-17 DIAGNOSIS — Z0001 Encounter for general adult medical examination with abnormal findings: Secondary | ICD-10-CM

## 2022-09-17 LAB — LIPID PANEL
Cholesterol: 116 mg/dL (ref 0–200)
HDL: 53.1 mg/dL (ref >39.00)
LDL Cholesterol: 45 mg/dL (ref 0–99)
NonHDL: 63.27
Total CHOL/HDL Ratio: 2
Triglycerides: 91 mg/dL (ref 0.0–149.0)
VLDL: 18.2 mg/dL (ref 0.0–40.0)

## 2022-09-17 LAB — COMPREHENSIVE METABOLIC PANEL
ALT: 20 U/L (ref 0–53)
AST: 32 U/L (ref 0–37)
Albumin: 4.1 g/dL (ref 3.5–5.2)
Alkaline Phosphatase: 69 U/L (ref 39–117)
BUN: 17 mg/dL (ref 6–23)
CO2: 29 mEq/L (ref 19–32)
Calcium: 9.4 mg/dL (ref 8.4–10.5)
Chloride: 104 mEq/L (ref 96–112)
Creatinine, Ser: 0.88 mg/dL (ref 0.40–1.50)
GFR: 85.13 mL/min (ref >60.00)
Glucose, Bld: 103 mg/dL — ABNORMAL HIGH (ref 70–99)
Potassium: 4.7 mEq/L (ref 3.5–5.1)
Sodium: 138 mEq/L (ref 135–145)
Total Bilirubin: 0.4 mg/dL (ref 0.2–1.2)
Total Protein: 6.7 g/dL (ref 6.0–8.3)

## 2022-09-17 LAB — CBC WITH DIFFERENTIAL/PLATELET
Basophils Absolute: 0 10*3/uL (ref 0.0–0.1)
Basophils Relative: 0.7 % (ref 0.0–3.0)
Eosinophils Absolute: 0.4 10*3/uL (ref 0.0–0.7)
Eosinophils Relative: 7.6 % — ABNORMAL HIGH (ref 0.0–5.0)
HCT: 40.9 % (ref 39.0–52.0)
Hemoglobin: 13.6 g/dL (ref 13.0–17.0)
Lymphocytes Relative: 22.8 % (ref 12.0–46.0)
Lymphs Abs: 1.1 10*3/uL (ref 0.7–4.0)
MCHC: 33.3 g/dL (ref 30.0–36.0)
MCV: 100.5 fl — ABNORMAL HIGH (ref 78.0–100.0)
Monocytes Absolute: 0.6 10*3/uL (ref 0.1–1.0)
Monocytes Relative: 12.2 % — ABNORMAL HIGH (ref 3.0–12.0)
Neutro Abs: 2.7 10*3/uL (ref 1.4–7.7)
Neutrophils Relative %: 56.7 % (ref 43.0–77.0)
Platelets: 179 10*3/uL (ref 150.0–400.0)
RBC: 4.07 Mil/uL — ABNORMAL LOW (ref 4.22–5.81)
RDW: 14.4 % (ref 11.5–15.5)
WBC: 4.7 10*3/uL (ref 4.0–10.5)

## 2022-09-17 LAB — PSA: PSA: 1.22 ng/mL (ref 0.10–4.00)

## 2022-09-17 LAB — MICROALBUMIN / CREATININE URINE RATIO
Creatinine,U: 66.1 mg/dL
Microalb Creat Ratio: 1.1 mg/g (ref 0.0–30.0)
Microalb, Ur: <0.7 mg/dL (ref 0.0–1.9)

## 2022-09-17 LAB — B12 AND FOLATE PANEL
Folate: 23 ng/mL (ref >5.9)
Vitamin B-12: 306 pg/mL (ref 211–911)

## 2022-09-17 LAB — HEMOGLOBIN A1C: Hgb A1c MFr Bld: 7.3 % — ABNORMAL HIGH (ref 4.6–6.5)

## 2022-09-17 LAB — VITAMIN D 25 HYDROXY (VIT D DEFICIENCY, FRACTURES): VITD: 51.28 ng/mL (ref 30.00–100.00)

## 2022-09-17 NOTE — Telephone Encounter (Signed)
PCP discussed changing statin at appointment 09/17/2022 - decided to continue with current therpay - ezetimibe / simvastatin 10/'10mg'$  daily.

## 2022-09-17 NOTE — Progress Notes (Unsigned)
Subjective:    Patient ID: Daniel Cruz, male    DOB: 06-06-1951, 71 y.o.   MRN: [id number]  DOS:  09/17/2022 Type of visit - description: CPX  Here for CPX. Since the last office visit is doing well. Increased his exercise, going to the gym. Denies any GI or GU symptoms. Note from cardiology reviewed.  Review of Systems See above   Past Medical History:  Diagnosis Date   Anemia, iron deficiency    hx   CAD (coronary artery disease)     NSTEMI with PCI of the mid and distal RCA with 50-60% LAD 09/2000, s/p cath 01/2009 with patent RCA stent and 90% LAD s/p PCI of LAD and right PDA   Cholelithiasis    DIABETES MELLITUS, TYPE II 02/26/2007   Fatty liver 02/2009   per u/s   GERD (gastroesophageal reflux disease)    HTN (hypertension)    Myocardial infarction ([place])    2001   OSA (obstructive sleep apnea)    can't tol. a CPAP   Plantar fasciitis of left foot    PVC (premature ventricular contraction) 06/06/2017   Sleep apnea    UTI (lower urinary tract infection)    in the 80s and 11/09    Past Surgical History:  Procedure Laterality Date   CARDIAC CATHETERIZATION  10/2008   stents   CHOLECYSTECTOMY N/A 07/16/2016   Procedure: LAPAROSCOPIC CHOLECYSTECTOMY WITH INTRAOPERATIVE CHOLANGIOGRAM;  Surgeon: [name], MD;  Location: [facility];  Service: General;  Laterality: N/A;   CORONARY STENT INTERVENTION N/A 09/20/2019   Procedure: CORONARY STENT INTERVENTION;  Surgeon: Burnell Blanks, MD;  Location: [facility];  Service: Cardiovascular;  Laterality: N/A;   LEFT HEART CATH AND CORONARY ANGIOGRAPHY N/A 09/20/2019   Procedure: LEFT HEART CATH AND CORONARY ANGIOGRAPHY;  Surgeon: Burnell Blanks, MD;  Location: [facility];  Service: Cardiovascular;  Laterality: N/A;   SEPTOPLASTY     w/ bilateral inferior turbinate reductions   TONSILLECTOMY      Current Outpatient Medications  Medication Instructions   blood glucose meter kit and  supplies Dispense based on patient and insurance preference. Check blood sugar once daily   carvedilol (COREG) 6.25 MG tablet TAKE 1 TABLET BY MOUTH 2 TIMES DAILY WITH A MEAL.   cetirizine (ZYRTEC) 10 mg, Oral, Daily   cholecalciferol (VITAMIN D3) 1,000 Units, Oral, Daily   clopidogrel (PLAVIX) 75 MG tablet TAKE 1 TABLET BY MOUTH EVERY DAY WITH BREAKFAST   dapagliflozin propanediol (FARXIGA) 10 MG TABS tablet TAKE 1 TABLET BY MOUTH EVERY DAY BEFORE BREAKFAST   ezetimibe-simvastatin (VYTORIN) 10-10 MG tablet 1 tablet, Oral, Daily   fluticasone (FLONASE) 50 MCG/ACT nasal spray 1 spray, Each Nare, 2 times daily   glipiZIDE (GLUCOTROL XL) 10 mg, Oral, Daily with breakfast   guaiFENesin (MUCINEX) 600 mg, 2 times daily PRN   isosorbide mononitrate (IMDUR) 30 mg, Oral, Daily   lisinopril (ZESTRIL) 20 mg, Oral, Daily   Multiple Vitamin (MULTIVITAMIN) tablet 1 tablet, Oral, Daily,     nitroGLYCERIN (NITROSTAT) 0.4 MG SL tablet PLACE 1 TABLET UNDER THE TONGUE EVERY 5 (FIVE) MINUTES X 3 DOSES AS NEEDED FOR CHEST PAIN.   ONETOUCH ULTRA test strip CHECK BLOOD SUGAR 1 DAILY   pantoprazole (PROTONIX) 40 MG tablet TAKE 1 TABLET TWICE DAILY  BEFORE MEALS   pioglitazone-metformin (ACTOPLUS MET) 15-850 MG tablet 1 tablet, Oral, 2 times daily with meals   polyethylene glycol (MIRALAX / GLYCOLAX) 17 g, Daily PRN  TRADJENTA 5 MG TABS tablet TAKE 1 TABLET DAILY       Objective:   Physical Exam BP 126/64   Pulse (!) 57   Temp (!) 97.5 F (36.4 C) (Oral)   Resp 18   Ht _0  (1.803 m)   Wt 252 lb 6 oz (114.5 kg)   SpO2 97%   BMI 35.20 kg/m  General: Well developed, NAD, BMI noted Neck: No  thyromegaly  HEENT:  Normocephalic . Face symmetric, atraumatic Lungs:  CTA B Normal respiratory effort, no intercostal retractions, no accessory muscle use. Heart: RRR,  no murmur.  Abdomen:  Not distended, soft, non-tender. No rebound or rigidity.   DM foot exam: No edema, good pedal pulses, pinprick  examination normal Skin: Exposed areas without rash. Not pale. Not jaundice Neurologic:  alert & oriented X3.  Speech normal, gait appropriate for age and unassisted Strength symmetric and appropriate for age.  Psych: Cognition and judgment appear intact.  Cooperative with normal attention span and concentration.  Behavior appropriate. No anxious or depressed appearing.     Assessment     Assessment DM DX 2008 HTN Hyperlipidemia CAD:  -MI 2001, syncope 2010 -07-2017: SOB, low risk stress test -Catheterization 09/2019: one stent  -Myocardial perfusion scan ~ 03/2021, minimal ischemia OSA, CPAP intolerant ED GI/anemia: --Mild chronic anemia, low iron stores.  Diabetes, history of anemia --Normal colonoscopy 2014, EGD 03/2019: H. pylori negative.   --Saw GI 04-2017: no further w/u  --Transglutaminase IgA and IgM normal 08-2020 --GERD --Fatty liver 2010 Used to see  Dr Allyson Sabal Prostatitis/UTI: recurrent , at some point saw urology.   PLAN Here for CPX DM: Currently on Farxiga, glipizide, Actos plus met and Tradjenta.  Foot exam normal today.  Last A1c 6.8.  Doing better with exercise, check labs. HTN: BP today is very good, recommend to check at home, continue carvedilol, Imdur, lisinopril.  Checking labs Hyperlipidemia: On Vytorin, checking labs CAD Saw cardiology 05/22/2022.  Due to bruising was recommended to stop aspirin and continue Plavix.  Remains asymptomatic History of mild anemia, increased MCV: Checking folic acid and F42. Osteopenia: Mild, on vitamin D, checking levels. Prostatitis: See last couple of visits, he is now completely asymptomatic, last DRE normal, PSA trending down.  Recheck today. Osteopenia: Decrease height noted: Tscore -1.2 ( 06-2020), on vitamin D, check levels.  Consider repeat DEXA in few years. RTC 6 months      --Td 12-2015 -  pneumonia 2008 and 2018; PNM 13: 2015; PNM 20: Today -  zostavax 2014; s/p shingrex  - RSV done per pt  - C-19  vaccines booster rec - had a flu shot   --CCS: had Cscope 2003 negative ,again in 2014 (Dr Oletta Lamas).  Subsequently saw Dr. Ardis Hughs and he recommended next colonoscopy 2024. -Prostate cancer screening: See comments under prostatitis.  Checking a PSA. -- Diet-exercise: Doing well with exercise, going to the gym, plays golf.  Recommend to watch diet closely --labs:   CMP FLP CBC L9R PSA V20 folic acid -Healthcare POA info provided.

## 2022-09-17 NOTE — Patient Instructions (Addendum)
Vaccines I recommend:  Covid booster   Check the  blood pressure regularly BP GOAL is between 110/65 and  135/85. If it is consistently higher or lower, let me know   GO TO THE LAB : Get the blood work     [place], [place] back for   a checkup in 6 months    Do you have a "Living will" or "[place] of attorney"? (Advance care planning documents)  If you already have a living will or healthcare power of attorney, is recommended you bring the copy to be scanned in your chart. The document will be available to all the doctors you see in the system.  If you don't have one, please consider create one.  More information at:  [email or web address]

## 2022-09-18 NOTE — Assessment & Plan Note (Signed)
--  Td 12-2015 -  pneumonia 2008 and 2018; PNM 13: 2015; PNM 20: Today -  zostavax 2014; s/p shingrex  - RSV done per pt  - C-19 vaccines booster rec - had a flu shot   --CCS: had Cscope 2003 negative ,again in 2014 (Dr Oletta Lamas).  Subsequently saw Dr. Ardis Hughs and he recommended next colonoscopy 2024. -Prostate cancer screening: See comments under prostatitis.  Checking a PSA. -- Diet-exercise: Doing well with exercise, going to the gym, plays golf.  Recommend to watch diet closely --labs:   CMP FLP CBC F7T PSA K24 folic acid -Healthcare POA info provided.

## 2022-09-18 NOTE — Assessment & Plan Note (Signed)
Here for CPX DM: Currently on Farxiga, glipizide, Actos plus met and Tradjenta.  Foot exam normal today.  Last A1c 6.8.  Doing better with exercise, check labs. HTN: BP today is very good, recommend to check at home, continue carvedilol, Imdur, lisinopril.  Checking labs Hyperlipidemia: On Vytorin, checking labs CAD Saw cardiology 05/22/2022, note reviewed..  Due to bruising was recommended to stop aspirin and continue Plavix.  Remains asymptomatic History of mild anemia, increased MCV: Checking folic acid and T55. Prostatitis: See last couple of visits, he is now completely asymptomatic, last DRE normal, PSA trending down.  Recheck today. Osteopenia: Decrease height noted: Tscore -1.2 ( 06-2020), on vitamin D, check levels.  Consider repeat DEXA in few years. RTC 6 months

## 2022-09-19 NOTE — Addendum Note (Signed)
Addended byDamita Dunnings D on: 09/19/2022 08:51 AM   Modules accepted: Orders

## 2022-10-09 ENCOUNTER — Other Ambulatory Visit: Payer: Self-pay | Admitting: Cardiology

## 2022-11-06 ENCOUNTER — Ambulatory Visit (INDEPENDENT_AMBULATORY_CARE_PROVIDER_SITE_OTHER): Payer: Medicare HMO

## 2022-11-06 VITALS — Wt 252.0 lb

## 2022-11-06 DIAGNOSIS — Z0000 Encounter for general adult medical examination without abnormal findings: Secondary | ICD-10-CM | POA: Diagnosis not present

## 2022-11-06 NOTE — Progress Notes (Signed)
I connected with  Daniel Cruz on 11/06/22 by a audio enabled telemedicine application and verified that I am speaking with the correct person using two identifiers.  Patient Location: Home  Provider Location: Home Office  I discussed the limitations of evaluation and management by telemedicine. The patient expressed understanding and agreed to proceed.   Subjective:   Daniel Cruz is a 72 y.o. male who presents for Medicare Annual/Subsequent preventive examination.  Review of Systems     Cardiac Risk Factors include: advanced age (>34mn, >>25women);hypertension;diabetes mellitus;dyslipidemia;male gender;obesity (BMI >30kg/m2)     Objective:    Today's Vitals   11/06/22 0835  Weight: 252 lb (114.3 kg)   Body mass index is 35.15 kg/m.     11/06/2022    8:39 AM 03/16/2020   10:22 AM 09/20/2019    6:11 AM 03/15/2019   11:15 AM 11/04/2018    8:49 AM 08/27/2018   11:48 AM 03/12/2018    9:22 AM  Advanced Directives  Does Patient Have a Medical Advance Directive? Yes No;Yes Yes Yes Yes Yes Yes  Type of AParamedicof AFargoLiving will H[place]Living will  H[place]Living will HFive PointsLiving will HVassLiving will HAm[place]Living will  Does patient want to make changes to medical advance directive?   No - Patient declined No - Patient declined     Copy of HR[place]ottin Chart? No - copy requested No - copy requested  No - copy requested Yes - validated most recent copy scanned in chart (See row information) No - copy requested No - copy requested  Would patient like information on creating a medical advance directive?  No - Patient declined         Current Medications (verified) Outpatient Encounter Medications as of 11/06/2022  Medication Sig   AREXVY 120 MCG/0.5ML injection    carvedilol (COREG) 6.25 MG tablet TAKE 1 TABLET  BY MOUTH TWICE A DAY WITH FOOD   cetirizine (ZYRTEC) 10 MG tablet Take 10 mg by mouth daily.   cholecalciferol (VITAMIN D3) 25 MCG (1000 UNIT) tablet Take 1,000 Units by mouth daily.   clopidogrel (PLAVIX) 75 MG tablet TAKE 1 TABLET BY MOUTH EVERY DAY WITH BREAKFAST   COMIRNATY syringe    dapagliflozin propanediol (FARXIGA) 10 MG TABS tablet TAKE 1 TABLET BY MOUTH EVERY DAY BEFORE BREAKFAST   ezetimibe-simvastatin (VYTORIN) 10-10 MG tablet Take 1 tablet by mouth daily.   FLUAD QUADRIVALENT 0.5 ML injection    glipiZIDE (GLUCOTROL XL) 10 MG 24 hr tablet Take 1 tablet (10 mg total) by mouth daily with breakfast.   isosorbide mononitrate (IMDUR) 30 MG 24 hr tablet Take 1 tablet (30 mg total) by mouth daily.   lisinopril (ZESTRIL) 20 MG tablet Take 1 tablet (20 mg total) by mouth daily.   Multiple Vitamin (MULTIVITAMIN) tablet Take 1 tablet by mouth daily.   pantoprazole (PROTONIX) 40 MG tablet TAKE 1 TABLET TWICE DAILY  BEFORE MEALS   pioglitazone-metformin (ACTOPLUS MET) 15-850 MG tablet Take 1 tablet by mouth 2 (two) times daily with a meal.   TRADJENTA 5 MG TABS tablet TAKE 1 TABLET DAILY   nitroGLYCERIN (NITROSTAT) 0.4 MG SL tablet PLACE 1 TABLET UNDER THE TONGUE EVERY 5 (FIVE) MINUTES X 3 DOSES AS NEEDED FOR CHEST PAIN. (Patient not taking: Reported on 09/17/2022)   [DISCONTINUED] blood glucose meter kit and supplies Dispense based on patient and insurance preference.  Check blood sugar once daily (Patient not taking: Reported on 09/17/2022)   [DISCONTINUED] fluticasone (FLONASE) 50 MCG/ACT nasal spray Place 1 spray into both nostrils 2 (two) times daily. (Patient not taking: Reported on 09/17/2022)   [DISCONTINUED] guaiFENesin (MUCINEX) 600 MG 12 hr tablet Take 600 mg by mouth 2 (two) times daily as needed.   [DISCONTINUED] ONETOUCH ULTRA test strip CHECK BLOOD SUGAR 1 DAILY (Patient not taking: Reported on 09/17/2022)   No facility-administered encounter medications on file as of 11/06/2022.     Allergies (verified) Januvia [sitagliptin]   History: Past Medical History:  Diagnosis Date   Anemia, iron deficiency    hx   CAD (coronary artery disease)     NSTEMI with PCI of the mid and distal RCA with 50-60% LAD 09/2000, s/p cath 01/2009 with patent RCA stent and 90% LAD s/p PCI of LAD and right PDA   Cholelithiasis    DIABETES MELLITUS, TYPE II 02/26/2007   Fatty liver 02/2009   per u/s   GERD (gastroesophageal reflux disease)    HTN (hypertension)    Myocardial infarction ([place])    2001   OSA (obstructive sleep apnea)    can't tol. a CPAP   Plantar fasciitis of left foot    PVC (premature ventricular contraction) 06/06/2017   Sleep apnea    UTI (lower urinary tract infection)    in the 80s and 11/09   Past Surgical History:  Procedure Laterality Date   CARDIAC CATHETERIZATION  10/2008   stents   CHOLECYSTECTOMY N/A 07/16/2016   Procedure: LAPAROSCOPIC CHOLECYSTECTOMY WITH INTRAOPERATIVE CHOLANGIOGRAM;  Surgeon: [name], MD;  Location: [facility];  Service: General;  Laterality: N/A;   CORONARY STENT INTERVENTION N/A 09/20/2019   Procedure: CORONARY STENT INTERVENTION;  Surgeon: Burnell Blanks, MD;  Location: [facility];  Service: Cardiovascular;  Laterality: N/A;   LEFT HEART CATH AND CORONARY ANGIOGRAPHY N/A 09/20/2019   Procedure: LEFT HEART CATH AND CORONARY ANGIOGRAPHY;  Surgeon: Burnell Blanks, MD;  Location: [facility];  Service: Cardiovascular;  Laterality: N/A;   SEPTOPLASTY     w/ bilateral inferior turbinate reductions   TONSILLECTOMY     Family History  Problem Relation Age of Onset   Emphysema Mother        died   COPD Mother    Stroke Father    Coronary artery disease Father    Cancer Neg Hx        colon ,prostate   Diabetes Neg Hx    Colon cancer Neg Hx    Esophageal cancer Neg Hx    Rectal cancer Neg Hx    Stomach cancer Neg Hx    Prostate cancer Neg Hx    Social History   Socioeconomic History    Marital status: Married    Spouse name: Not on file   Number of children: 2   Years of education: Not on file   Highest education level: Not on file  Occupational History   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, production at [employer]  Tobacco Use   Smoking status: Former    Packs/day: 1.50    Years: 16.00    Total pack years: 24.00    Types: Cigarettes    Quit date: 07/08/2000    Years since quitting: 22.3   Smokeless tobacco: Never   Tobacco comments:    2001  Vaping Use   Vaping Use: Never used  Substance and Sexual Activity   Alcohol  use: Yes    Comment: socially   Drug use: No   Sexual activity: Yes  Other Topics Concern   Not on file  Social History Narrative   Lives in [place], [place]   Lives w/ wife, 2 children, 3 g-kids   Retired April 2017          Social Determinants of Health   Financial Resource Strain: [place]  (11/06/2022)   Overall Financial Resource Strain (CARDIA)    Difficulty of Paying Living Expenses: Not hard at all  Food Insecurity: No Food Insecurity (11/06/2022)   Hunger Vital Sign    Worried About Running Out of Food in the Last Year: Never true    [place] in the Last Year: Never true  Transportation Needs: No Transportation Needs (11/06/2022)   PRAPARE - Hydrologist (Medical): No    Lack of Transportation (Non-Medical): No  Physical Activity: Sufficiently Active (11/06/2022)   Exercise Vital Sign    Days of Exercise per Week: 4 days    Minutes of Exercise per Session: 90 min  Stress: No Stress Concern Present (11/06/2022)   [place]    Feeling of Stress : Not at all  Social Connections: Moderately Integrated (11/06/2022)   Social Connection and Isolation Panel [NHANES]    Frequency of Communication with Friends and Family: More than three times a week    Frequency of Social Gatherings with Friends and Family:  More than three times a week    Attends Religious Services: Never    Marine scientist or Organizations: Yes    Attends Archivist Meetings: 1 to 4 times per year    Marital Status: Married    Tobacco Counseling Counseling given: Not Answered Tobacco comments: 2001   Clinical Intake:  Pre-visit preparation completed: Yes  Pain : No/denies pain     BMI - recorded: 35.15 Nutritional Status: BMI > 30  Obese Nutritional Risks: None Diabetes: Yes CBG done?: No Did pt. bring in CBG monitor from home?: No  How often do you need to have someone help you when you read instructions, pamphlets, or other written materials from your doctor or pharmacy?: 1 - Never  Diabetic?Nutrition Risk Assessment:  Has the patient had any N/V/D within the last 2 months?  No  Does the patient have any non-healing wounds?  No  Has the patient had any unintentional weight loss or weight gain?  No   Diabetes:  Is the patient diabetic?  Yes  If diabetic, was a CBG obtained today?  No  Did the patient bring in their glucometer from home?  No  How often do you monitor your CBG's? N/a.   Financial Strains and Diabetes Management:  Are you having any financial strains with the device, your supplies or your medication? No .  Does the patient want to be seen by Chronic Care Management for management of their diabetes?  No  Would the patient like to be referred to a Nutritionist or for Diabetic Management?  No   Diabetic Exams:  Diabetic Eye Exam: Completed 09/17/22 Diabetic Foot Exam: Completed 09/17/22   Interpreter Needed?: No  Information entered by :: Charlott Rakes, LPN   Activities of Daily Living    11/06/2022    8:40 AM  In your present state of health, do you have any difficulty performing the following activities:  Hearing? 0  Vision? 0  Difficulty concentrating  or making decisions? 0  Walking or climbing stairs? 0  Dressing or bathing? 0  Doing errands,  shopping? 0  Preparing Food and eating ? N  Using the Toilet? N  In the past six months, have you accidently leaked urine? N  Do you have problems with loss of bowel control? N  Managing your Medications? N  Managing your Finances? N  Housekeeping or managing your Housekeeping? N    Patient Care Team: Colon Branch, MD as PCP - General (Internal Medicine) Sueanne Margarita, MD as PCP - Cardiology (Cardiology) [name], MD as Consulting Physician (General Surgery) Cherlyn Roberts, OD (Optometry)  Indicate any recent Medical Services you may have received from other than [facility] providers in the past year (date may be approximate).     Assessment:   This is a routine wellness examination for Sir[place].  Hearing/Vision screen Hearing Screening - Comments:: Pt denies any hearing issues  Vision Screening - Comments:: Pt follows up with sally miller for annual eye exams   Dietary issues and exercise activities discussed: Current Exercise Habits: Home exercise routine, Type of exercise: Other - see comments, Time (Minutes): > 60, Frequency (Times/Week): 4, Weekly Exercise (Minutes/Week): 0   Goals Addressed             This Visit's Progress    Patient Stated       None at this time        Depression Screen    11/06/2022    8:38 AM 09/17/2022    9:24 AM 04/24/2022    8:15 AM 01/23/2022    8:36 AM 03/15/2021    8:30 AM 03/16/2020   10:24 AM 03/15/2019   11:16 AM  PHQ 2/9 Scores  PHQ - 2 Score 0 0 0 0 0 0 0    Fall Risk    11/06/2022    8:40 AM 09/17/2022    9:24 AM 04/24/2022    8:15 AM 01/23/2022    8:35 AM 03/15/2021    8:30 AM  Fall Risk   Falls in the past year? 0 0 0 0 0  Number falls in past yr: 0 0 0 0 0  Injury with Fall? 0 0 0 0 0  Risk for fall due to : Impaired vision      Follow up Falls prevention discussed Falls evaluation completed Falls evaluation completed Falls evaluation completed Falls evaluation completed    FALL RISK PREVENTION PERTAINING TO THE  HOME:  Any stairs in or around the home? Yes  If so, are there any without handrails? No  Home free of loose throw rugs in walkways, pet beds, electrical cords, etc? Yes  Adequate lighting in your home to reduce risk of falls? Yes   ASSISTIVE DEVICES UTILIZED TO PREVENT FALLS:  Life alert? No  Use of a cane, walker or w/c? No  Grab bars in the bathroom? No  Shower chair or bench in shower? No  Elevated toilet seat or a handicapped toilet? No   TIMED UP AND GO:  Was the test performed? No .   Cognitive Function:        11/06/2022    8:41 AM 03/16/2020   10:26 AM  6CIT Screen  What Year? 0 points 0 points  What month? 0 points 0 points  What time? 0 points 0 points  Count back from 20 0 points 0 points  Months in reverse 2 points 0 points  Repeat phrase 0 points 0 points  Total  Score 2 points 0 points    Immunizations Immunization History  Administered Date(s) Administered   Fluad Quad(high Dose 65+) 06/15/2019, 07/05/2022   H1N1 11/08/2008   Influenza Split 11/01/2011   Influenza Whole 07/20/2007, 10/20/2009, 08/14/2010   Influenza, High Dose Seasonal PF 09/15/2015, 11/08/2016, 08/10/2018, 07/19/2020   Influenza,inj,Quad PF,6+ Mos 06/30/2013, 08/24/2014   Influenza-Unspecified 07/14/2017   PFIZER Comirnaty(Gray Top)Covid-19 Tri-Sucrose Vaccine 01/17/2021   PFIZER(Purple Top)SARS-COV-2 Vaccination 11/12/2019, 12/07/2019, 07/02/2020   PNEUMOCOCCAL CONJUGATE-20 09/17/2022   Pfizer Covid-19 Vaccine Bivalent Booster 37yr & up 06/15/2021, 04/26/2022   Pneumococcal Conjugate-13 08/24/2014   Pneumococcal Polysaccharide-23 07/20/2007, 03/11/2017   Respiratory Syncytial Virus Vaccine,Recomb Aduvanted(Arexvy) 07/05/2022   Td 10/08/2003, 12/22/2015   Zoster Recombinat (Shingrix) 12/26/2018, 03/07/2019   Zoster, Live 11/30/2012    TDAP status: Up to date  Flu Vaccine status: Up to date  Pneumococcal vaccine status: Up to date  Covid-19 vaccine status: Completed  vaccines  Qualifies for Shingles Vaccine? Yes   Zostavax completed Yes   Shingrix Completed?: Yes  Screening Tests Health Maintenance  Topic Date Due   COVID-19 Vaccine (7 - 2023-24 season) 06/21/2022   COLONOSCOPY (Pts 45-454yrInsurance coverage will need to be confirmed)  01/08/2023   OPHTHALMOLOGY EXAM  02/14/2023   HEMOGLOBIN A1C  03/19/2023   Diabetic kidney evaluation - eGFR measurement  09/18/2023   Diabetic kidney evaluation - Urine ACR  09/18/2023   FOOT EXAM  09/18/2023   Medicare Annual Wellness (AWV)  11/07/2023   DTaP/Tdap/Td (3 - Tdap) 12/21/2025   Pneumonia Vaccine 6540Years old  Completed   INFLUENZA VACCINE  Completed   Hepatitis C Screening  Completed   Zoster Vaccines- Shingrix  Completed   HPV VACCINES  Aged Out    Health Maintenance  Health Maintenance Due  Topic Date Due   COVID-19 Vaccine (7 - 2023-24 season) 06/21/2022    Colorectal cancer screening: Type of screening: Colonoscopy. Completed 01/07/13. Repeat every 10 years  L Additional Screening:  Hepatitis C Screening:  Completed 11/08/16  Vision Screening: Recommended annual ophthalmology exams for early detection of glaucoma and other disorders of the eye. Is the patient up to date with their annual eye exam?  Yes  Who is the provider or what is the name of the office in which the patient attends annual eye exams? [facility]  If pt is not established with a provider, would they like to be referred to a provider to establish care? No .   Dental Screening: Recommended annual dental exams for proper oral hygiene  Community Resource Referral / Chronic Care Management: CRR required this visit?  No   CCM required this visit?  No      Plan:     I have personally reviewed and noted the following in the patient's chart:   Medical and social history Use of alcohol, tobacco or illicit drugs  Current medications and supplements including opioid prescriptions. Patient is not currently taking  opioid prescriptions. Functional ability and status Nutritional status Physical activity Advanced directives List of other physicians Hospitalizations, surgeries, and ER visits in previous 12 months Vitals Screenings to include cognitive, depression, and falls Referrals and appointments  In addition, I have reviewed and discussed with patient certain preventive protocols, quality metrics, and best practice recommendations. A written personalized care plan for preventive services as well as general preventive health recommendations were provided to patient.     TiWillette BraceLPN   10/11/83/4627 Nurse Notes: none

## 2022-11-06 NOTE — Progress Notes (Signed)
I have reviewed and agree with [facility] documentation.  Kathlene November, MD

## 2022-11-06 NOTE — Patient Instructions (Signed)
Daniel Cruz , Thank you for taking time to come for your Medicare Wellness Visit. I appreciate your ongoing commitment to your health goals. Please review the following plan we discussed and let me know if I can assist you in the future.   These are the goals we discussed:  Goals      Patient Stated     Continue exercise routine     Patient Stated     None at this time      Weight (lb) < 245 lb (111.1 kg)     Eat smaller portions and walk 4x/week for at least 71mn.        This is a list of the screening recommended for you and due dates:  Health Maintenance  Topic Date Due   COVID-19 Vaccine (7 - 2023-24 season) 06/21/2022   Colon Cancer Screening  01/08/2023   Eye exam for diabetics  02/14/2023   Hemoglobin A1C  03/19/2023   Yearly kidney function blood test for diabetes  09/18/2023   Yearly kidney health urinalysis for diabetes  09/18/2023   Complete foot exam   09/18/2023   Medicare Annual Wellness Visit  11/07/2023   DTaP/Tdap/Td vaccine (3 - Tdap) 12/21/2025   Pneumonia Vaccine  Completed   Flu Shot  Completed   Hepatitis C Screening: USPSTF Recommendation to screen - Ages 118-79yo.  Completed   Zoster (Shingles) Vaccine  Completed   HPV Vaccine  Aged Out    Advanced directives: Advance directive discussed with you today. Even though you declined this today please call our office should you change your mind and we can give you the proper paperwork for you to fill out.  Conditions/risks identified: none at this time   Next appointment: Follow up in one year for your annual wellness visit.   Preventive Care 612Years and Older, Male  Preventive care refers to lifestyle choices and visits with your health care provider that can promote health and wellness. What does preventive care include? A yearly physical exam. This is also called an annual well check. Dental exams once or twice a year. Routine eye exams. Ask your health care provider how often you should have  your eyes checked. Personal lifestyle choices, including: Daily care of your teeth and gums. Regular physical activity. Eating a healthy diet. Avoiding tobacco and drug use. Limiting alcohol use. Practicing safe sex. Taking low doses of aspirin every day. Taking vitamin and mineral supplements as recommended by your health care provider. What happens during an annual well check? The services and screenings done by your health care provider during your annual well check will depend on your age, overall health, lifestyle risk factors, and family history of disease. Counseling  Your health care provider may ask you questions about your: Alcohol use. Tobacco use. Drug use. Emotional well-being. Home and relationship well-being. Sexual activity. Eating habits. History of falls. Memory and ability to understand (cognition). Work and work eStatistician Screening  You may have the following tests or measurements: Height, weight, and BMI. Blood pressure. Lipid and cholesterol levels. These may be checked every 5 years, or more frequently if you are over 512years old. Skin check. Lung cancer screening. You may have this screening every year starting at age 2336if you have a 30-pack-year history of smoking and currently smoke or have quit within the past 15 years. Fecal occult blood test (FOBT) of the stool. You may have this test every year starting at age 74 Flexible sigmoidoscopy or  colonoscopy. You may have a sigmoidoscopy every 5 years or a colonoscopy every 10 years starting at age 72. Prostate cancer screening. Recommendations will vary depending on your family history and other risks. Hepatitis C blood test. Hepatitis B blood test. Sexually transmitted disease (STD) testing. Diabetes screening. This is done by checking your blood sugar (glucose) after you have not eaten for a while (fasting). You may have this done every 1-3 years. Abdominal aortic aneurysm (AAA) screening. You may  need this if you are a current or former smoker. Osteoporosis. You may be screened starting at age 28 if you are at high risk. Talk with your health care provider about your test results, treatment options, and if necessary, the need for more tests. Vaccines  Your health care provider may recommend certain vaccines, such as: Influenza vaccine. This is recommended every year. Tetanus, diphtheria, and acellular pertussis (Tdap, Td) vaccine. You may need a Td booster every 10 years. Zoster vaccine. You may need this after age 21. Pneumococcal 13-valent conjugate (PCV13) vaccine. One dose is recommended after age 55. Pneumococcal polysaccharide (PPSV23) vaccine. One dose is recommended after age 19. Talk to your health care provider about which screenings and vaccines you need and how often you need them. This information is not intended to replace advice given to you by your health care provider. Make sure you discuss any questions you have with your health care provider. Document Released: 10/20/2015 Document Revised: 06/12/2016 Document Reviewed: 07/25/2015 Elsevier Interactive Patient Education  2017 [employer] Prevention in the Home Falls can cause injuries. They can happen to people of all ages. There are many things you can do to make your home safe and to help prevent falls. What can I do on the outside of my home? Regularly fix the edges of walkways and driveways and fix any cracks. Remove anything that might make you trip as you walk through a door, such as a raised step or threshold. Trim any bushes or trees on the path to your home. Use bright outdoor lighting. Clear any walking paths of anything that might make someone trip, such as rocks or tools. Regularly check to see if handrails are loose or broken. Make sure that both sides of any steps have handrails. Any raised decks and porches should have guardrails on the edges. Have any leaves, snow, or ice cleared  regularly. Use sand or salt on walking paths during winter. Clean up any spills in your garage right away. This includes oil or grease spills. What can I do in the bathroom? Use night lights. Install grab bars by the toilet and in the tub and shower. Do not use towel bars as grab bars. Use non-skid mats or decals in the tub or shower. If you need to sit down in the shower, use a plastic, non-slip stool. Keep the floor dry. Clean up any water that spills on the floor as soon as it happens. Remove soap buildup in the tub or shower regularly. Attach bath mats securely with double-sided non-slip rug tape. Do not have throw rugs and other things on the floor that can make you trip. What can I do in the bedroom? Use night lights. Make sure that you have a light by your bed that is easy to reach. Do not use any sheets or blankets that are too big for your bed. They should not hang down onto the floor. Have a firm chair that has side arms. You can use this for support while  you get dressed. Do not have throw rugs and other things on the floor that can make you trip. What can I do in the kitchen? Clean up any spills right away. Avoid walking on wet floors. Keep items that you use a lot in easy-to-reach places. If you need to reach something above you, use a strong step stool that has a grab bar. Keep electrical cords out of the way. Do not use floor polish or wax that makes floors slippery. If you must use wax, use non-skid floor wax. Do not have throw rugs and other things on the floor that can make you trip. What can I do with my stairs? Do not leave any items on the stairs. Make sure that there are handrails on both sides of the stairs and use them. Fix handrails that are broken or loose. Make sure that handrails are as long as the stairways. Check any carpeting to make sure that it is firmly attached to the stairs. Fix any carpet that is loose or worn. Avoid having throw rugs at the top or  bottom of the stairs. If you do have throw rugs, attach them to the floor with carpet tape. Make sure that you have a light switch at the top of the stairs and the bottom of the stairs. If you do not have them, ask someone to add them for you. What else can I do to help prevent falls? Wear shoes that: Do not have high heels. Have rubber bottoms. Are comfortable and fit you well. Are closed at the toe. Do not wear sandals. If you use a stepladder: Make sure that it is fully opened. Do not climb a closed stepladder. Make sure that both sides of the stepladder are locked into place. Ask someone to hold it for you, if possible. Clearly mark and make sure that you can see: Any grab bars or handrails. First and last steps. Where the edge of each step is. Use tools that help you move around (mobility aids) if they are needed. These include: Canes. Walkers. Scooters. Crutches. Turn on the lights when you go into a dark area. Replace any light bulbs as soon as they burn out. Set up your furniture so you have a clear path. Avoid moving your furniture around. If any of your floors are uneven, fix them. If there are any pets around you, be aware of where they are. Review your medicines with your doctor. Some medicines can make you feel dizzy. This can increase your chance of falling. Ask your doctor what other things that you can do to help prevent falls. This information is not intended to replace advice given to you by your health care provider. Make sure you discuss any questions you have with your health care provider. Document Released: 07/20/2009 Document Revised: 02/29/2016 Document Reviewed: 10/28/2014 Elsevier Interactive Patient Education  2017 [employer].

## 2022-11-21 ENCOUNTER — Other Ambulatory Visit: Payer: Self-pay | Admitting: Internal Medicine

## 2022-11-21 ENCOUNTER — Other Ambulatory Visit: Payer: Self-pay | Admitting: Cardiology

## 2022-11-22 ENCOUNTER — Encounter: Payer: Self-pay | Admitting: Internal Medicine

## 2022-12-24 ENCOUNTER — Other Ambulatory Visit: Payer: Self-pay | Admitting: Family

## 2023-01-03 ENCOUNTER — Encounter: Payer: Self-pay | Admitting: Internal Medicine

## 2023-01-06 MED ORDER — DAPAGLIFLOZIN PROPANEDIOL 10 MG PO TABS: 10.0000 mg | ORAL_TABLET | Freq: Every day | ORAL | 1 refills | Status: DC

## 2023-01-06 MED ORDER — LINAGLIPTIN 5 MG PO TABS: 5.0000 mg | ORAL_TABLET | Freq: Every day | ORAL | 1 refills | Status: DC

## 2023-01-20 ENCOUNTER — Other Ambulatory Visit: Payer: Self-pay | Admitting: Internal Medicine

## 2023-01-22 ENCOUNTER — Telehealth: Payer: Self-pay

## 2023-01-22 ENCOUNTER — Encounter: Payer: Self-pay | Admitting: Internal Medicine

## 2023-01-22 ENCOUNTER — Ambulatory Visit: Payer: Medicare HMO | Admitting: Internal Medicine

## 2023-01-22 VITALS — BP 110/60 | HR 68 | Ht 70.0 in | Wt 248.5 lb

## 2023-01-22 DIAGNOSIS — K5900 Constipation, unspecified: Secondary | ICD-10-CM

## 2023-01-22 DIAGNOSIS — K219 Gastro-esophageal reflux disease without esophagitis: Secondary | ICD-10-CM | POA: Diagnosis not present

## 2023-01-22 DIAGNOSIS — R140 Abdominal distension (gaseous): Secondary | ICD-10-CM

## 2023-01-22 DIAGNOSIS — Z1211 Encounter for screening for malignant neoplasm of colon: Secondary | ICD-10-CM

## 2023-01-22 MED ORDER — NA SULFATE-K SULFATE-MG SULF 17.5-3.13-1.6 GM/177ML PO SOLN: ORAL | 0 refills | Status: DC

## 2023-01-22 NOTE — Patient Instructions (Signed)
You have been scheduled for a colonoscopy. Please follow written instructions given to you at your visit today.  Please pick up your prep supplies at the pharmacy within the next 1-3 days. If you use inhalers (even only as needed), please bring them with you on the day of your procedure.   We have sent the following medications to your pharmacy for you to pick up at your convenience: Suprep  If your blood pressure at your visit was 140/90 or greater, please contact your primary care physician to follow up on this.  _______________________________________________________  If you are age 107 or older, your body mass index should be between 23-30. Your Body mass index is 35.66 kg/m. If this is out of the aforementioned range listed, please consider follow up with your Primary Care Provider.  If you are age 74 or younger, your body mass index should be between 19-25. Your Body mass index is 35.66 kg/m. If this is out of the aformentioned range listed, please consider follow up with your Primary Care Provider.   ________________________________________________________  The [facility] providers would like to encourage you to use [facility] to communicate with providers for non-urgent requests or questions.  Due to long hold times on the telephone, sending your provider a message by [facility] may be a faster and more efficient way to get a response.  Please allow 48 business hours for a response.  Please remember that this is for non-urgent requests.   Due to recent changes in healthcare laws, you may see the results of your imaging and laboratory studies on MyChart before your provider has had a chance to review them.  We understand that in some cases there may be results that are confusing or concerning to you. Not all laboratory results come back in the same time frame and the provider may be waiting for multiple results in order to interpret others.  Please give [place] 48 hours in order for your provider to  thoroughly review all the results before contacting the office for clarification of your results.    Thank you for entrusting me with your care and for choosing [facility], Dr. Eulah Pont

## 2023-01-22 NOTE — Telephone Encounter (Addendum)
[facility] Pre-operative Risk Assessment     Request for surgical clearance:     Endoscopy Procedure  What type of surgery is being performed?     Colonoscopy  When is this surgery scheduled?     03/06/23  What type of clearance is required ?   Pharmacy  Are there any medications that need to be held prior to surgery and how long? Plavix 5 days hold  Practice name and name of physician performing surgery?      [facility]  What is your office phone and fax number?      Phone- [phone]  Fax- [phone]  Anesthesia type (None, local, MAC, general) ?       MAC

## 2023-01-22 NOTE — Telephone Encounter (Signed)
   Patient Name: Daniel Cruz  DOB: 1951-05-03 MRN: [id number]  Primary Cardiologist: Armanda Magic, MD  Chart reviewed as part of pre-operative protocol coverage. Pre-op clearance already addressed by colleagues in earlier phone notes. To summarize recommendations:  -Yes last PCI was 2020 and okay to hold Plavix for colonoscopy  -Dr. Mayford Knife  No medical clearance was requested.  Will route this bundled recommendation to requesting provider via Epic fax function and remove from pre-op pool. Please call with questions.  Sharlene Dory, PA-C 01/22/2023, 3:35 PM

## 2023-01-22 NOTE — Progress Notes (Unsigned)
Chief Complaint: Dyspepsia and constipation  HPI : 72 year old male with history of cholelithiasis s/p cholecystectomy, CAD s/p PCI, DM, fatty liver, GERD, OSA presents for follow up for dyspepsia and constipation  Interval History: He was recently diagnosed with spinal stenosis. His constipation has resolved. He no longer has to use Miralax and Linzess. As long as he stays from coffee, he will have less bloating. PPI BID seems to be keeping reflux under good control. Denies ab pain or blood in stools. He is still taking Plavix.   Wt Readings from Last 3 Encounters:  01/22/23 248 lb 8 oz (112.7 kg)  11/06/22 252 lb (114.3 kg)  09/17/22 252 lb 6 oz (114.5 kg)       He took Miralax daily, but he has not found any improvement in his bowel habits with MiraLAX therapy.  He did try Benefiber and also did not find any improvement. Denies ab pain. Still has bloating issues, which resolve after he has a BM. He has started jogging again and drinking more water.  He has been taking PPI twice daily without any issues.  Current Outpatient Medications  Medication Sig Dispense Refill   carvedilol (COREG) 6.25 MG tablet TAKE 1 TABLET BY MOUTH TWICE A DAY WITH FOOD 180 tablet 2   cetirizine (ZYRTEC) 10 MG tablet Take 10 mg by mouth daily.     cholecalciferol (VITAMIN D3) 25 MCG (1000 UNIT) tablet Take 1,000 Units by mouth daily.     clopidogrel (PLAVIX) 75 MG tablet TAKE 1 TABLET BY MOUTH EVERY DAY WITH BREAKFAST 90 tablet 3   dapagliflozin propanediol (FARXIGA) 10 MG TABS tablet Take 1 tablet (10 mg total) by mouth daily. 90 tablet 1   ezetimibe-simvastatin (VYTORIN) 10-10 MG tablet Take 1 tablet by mouth daily. 90 tablet 1   glipiZIDE (GLUCOTROL XL) 10 MG 24 hr tablet TAKE 1 TABLET BY MOUTH DAILY WITH BREAKFAST. 90 tablet 1   isosorbide mononitrate (IMDUR) 30 MG 24 hr tablet Take 1 tablet (30 mg total) by mouth daily. 90 tablet 3   linagliptin (TRADJENTA) 5 MG TABS tablet Take 1 tablet (5 mg total)  by mouth daily. 90 tablet 1   lisinopril (ZESTRIL) 20 MG tablet Take 1 tablet (20 mg total) by mouth daily. 90 tablet 3   Multiple Vitamin (MULTIVITAMIN) tablet Take 1 tablet by mouth daily.     pantoprazole (PROTONIX) 40 MG tablet TAKE 1 TABLET TWICE DAILY  BEFORE MEALS 180 tablet 1   pioglitazone-metformin (ACTOPLUS MET) 15-850 MG tablet Take 1 tablet by mouth 2 (two) times daily with a meal. 180 tablet 1   nitroGLYCERIN (NITROSTAT) 0.4 MG SL tablet PLACE 1 TABLET UNDER THE TONGUE EVERY 5 (FIVE) MINUTES X 3 DOSES AS NEEDED FOR CHEST PAIN. (Patient not taking: Reported on 09/17/2022) 25 tablet 1   No current facility-administered medications for this visit.   Review of Systems: All systems reviewed and negative except where noted in HPI.   Physical Exam: BP 110/60 (BP Location: Left Arm, Patient Position: Sitting, Cuff Size: Normal)   Pulse 68   Ht  (1.778 m)   Wt 248 lb 8 oz (112.7 kg)   BMI 35.66 kg/m  Constitutional: Pleasant,well-developed, male in no acute distress. HEENT: Normocephalic and atraumatic. Conjunctivae are normal. No scleral icterus. Cardiovascular: Normal rate, regular rhythm.  Pulmonary/chest: Effort normal and breath sounds normal. No wheezing, rales or rhonchi. Abdominal: Soft, nondistended, nontender. Bowel sounds active throughout. There are no masses palpable. No hepatomegaly. Extremities:  Trace BLE edema Neurological: Alert and oriented to person place and time. Skin: Skin is warm and dry. No rashes noted. Psychiatric: Normal mood and affect. Behavior is normal.  Labs 06/2021: CBC and CMP unremarkable  Gastric emptying study 04/08/19: IMPRESSION: Normal gastric emptying.  EGD 01/08/12: Normal  Colonoscopy 01/07/13: Few diverticula, internal hemorrhoids  EGD 03/22/19: - Mild gastritis, biopsied to check for H. pylori. - The examination was otherwise normal. Path: Surgical [P], gastric antrum and gastric body - CHRONIC ACTIVE GASTRITIS - NO  INTESTINAL METAPLASIA IDENTIFIED - SEE COMMENT Comment: H pylori was negative.  ASSESSMENT AND PLAN: Constipation Bloating Ab discomfort Dyspepsia Patient presents with longstanding issues with dyspepsia and constipation.  Gastric emptying study was normal, and EGD in 2020 showed mild gastritis with biopsies that were negative for H. pylori.  Currently already on PPI twice daily therapy.  Since patient has dramatic improvement in his bloating and abdominal discomfort with having a BM, it is likely that constipation explains his symptoms.  Since he has not responded to MiraLAX and conservative measures, will plan to start him on Linzess therapy . - Cont PPI BID, take 30 minutes before he eats - Will plan for colonoscopy [facility]. Will touch base with Dr. Mayford Knife about holding Plavix 5 days beforehand.  Eulah Pont, MD

## 2023-01-24 ENCOUNTER — Telehealth: Payer: Self-pay

## 2023-01-24 NOTE — Telephone Encounter (Signed)
Spoke to patient advise per cardiologist Dr Mayford Knife okay to hold Plavix 5 day's prior to procedure. Patient verbalized understanding.

## 2023-01-31 ENCOUNTER — Other Ambulatory Visit (INDEPENDENT_AMBULATORY_CARE_PROVIDER_SITE_OTHER): Payer: Medicare HMO

## 2023-01-31 DIAGNOSIS — E118 Type 2 diabetes mellitus with unspecified complications: Secondary | ICD-10-CM

## 2023-01-31 LAB — HEMOGLOBIN A1C: Hgb A1c MFr Bld: 7.1 % — ABNORMAL HIGH (ref 4.6–6.5)

## 2023-02-03 ENCOUNTER — Telehealth: Payer: Self-pay

## 2023-02-03 ENCOUNTER — Encounter: Payer: Self-pay | Admitting: Internal Medicine

## 2023-02-03 DIAGNOSIS — H25013 Cortical age-related cataract, bilateral: Secondary | ICD-10-CM | POA: Diagnosis not present

## 2023-02-03 DIAGNOSIS — H2513 Age-related nuclear cataract, bilateral: Secondary | ICD-10-CM | POA: Diagnosis not present

## 2023-02-03 DIAGNOSIS — E113292 Type 2 diabetes mellitus with mild nonproliferative diabetic retinopathy without macular edema, left eye: Secondary | ICD-10-CM | POA: Diagnosis not present

## 2023-02-03 LAB — HM DIABETES EYE EXAM

## 2023-02-03 MED ORDER — TIRZEPATIDE 2.5 MG/0.5ML ~~LOC~~ SOAJ: 2.5000 mg | SUBCUTANEOUS | 1 refills | Status: DC

## 2023-02-03 NOTE — Telephone Encounter (Signed)
PA initiated via Covermymeds; KEY: [id number]  The patient currently has access to the requested medication and a Prior Authorization is not needed for the patient/medication

## 2023-02-03 NOTE — Addendum Note (Signed)
Addended byConrad [place] D on: 02/03/2023 01:42 PM   Modules accepted: Orders

## 2023-02-19 ENCOUNTER — Encounter: Payer: Self-pay | Admitting: Internal Medicine

## 2023-02-21 ENCOUNTER — Encounter: Payer: Self-pay | Admitting: Internal Medicine

## 2023-02-21 ENCOUNTER — Ambulatory Visit (INDEPENDENT_AMBULATORY_CARE_PROVIDER_SITE_OTHER): Payer: Medicare HMO | Admitting: Internal Medicine

## 2023-02-21 VITALS — BP 126/66 | HR 70 | Temp 98.4°F | Resp 16 | Ht 70.0 in | Wt 244.4 lb

## 2023-02-21 DIAGNOSIS — Z7985 Long-term (current) use of injectable non-insulin antidiabetic drugs: Secondary | ICD-10-CM | POA: Diagnosis not present

## 2023-02-21 DIAGNOSIS — M7062 Trochanteric bursitis, left hip: Secondary | ICD-10-CM

## 2023-02-21 DIAGNOSIS — E118 Type 2 diabetes mellitus with unspecified complications: Secondary | ICD-10-CM | POA: Diagnosis not present

## 2023-02-21 NOTE — Patient Instructions (Addendum)
Apply Voltaren gel to the hip 2-3 times a day  Apply heat or ice to the hip 2-3 times a day  Tylenol  500 mg OTC 2 tabs a day every 8 hours as needed for pain  You can also take it sporadically 1 or 2 over-the-counter ibuprofens with food.  Call if not gradually better    Vaccines I recommend: Covid booster

## 2023-02-21 NOTE — Progress Notes (Unsigned)
Subjective:    Patient ID: Daniel Cruz, male    DOB: May 30, 1951, 72 y.o.   MRN: [id number]  DOS:  02/21/2023 Type of visit - description: Acute  Has developed pain at the left hip for the last 3 to 4 months, on and off. Getting slightly more frequent. Is not severe, "not debilitating".  Denies fall or injury No radiculopathy type of symptoms.  Review of Systems See above   Past Medical History:  Diagnosis Date   Anemia, iron deficiency    hx   CAD (coronary artery disease)     NSTEMI with PCI of the mid and distal RCA with 50-60% LAD 09/2000, s/p cath 01/2009 with patent RCA stent and 90% LAD s/p PCI of LAD and right PDA   Cholelithiasis    DIABETES MELLITUS, TYPE II 02/26/2007   Fatty liver 02/2009   per u/s   GERD (gastroesophageal reflux disease)    HTN (hypertension)    Myocardial infarction (HCC)    2001   OSA (obstructive sleep apnea)    can't tol. a CPAP   Plantar fasciitis of left foot    PVC (premature ventricular contraction) 06/06/2017   Sleep apnea    UTI (lower urinary tract infection)    in the 80s and 11/09    Past Surgical History:  Procedure Laterality Date   CARDIAC CATHETERIZATION  10/2008   stents   CHOLECYSTECTOMY N/A 07/16/2016   Procedure: LAPAROSCOPIC CHOLECYSTECTOMY WITH INTRAOPERATIVE CHOLANGIOGRAM;  Surgeon: Harriette Bouillon, MD;  Location: [facility] OR;  Service: General;  Laterality: N/A;   CORONARY STENT INTERVENTION N/A 09/20/2019   Procedure: CORONARY STENT INTERVENTION;  Surgeon: Kathleene Hazel, MD;  Location: [facility] INVASIVE CV LAB;  Service: Cardiovascular;  Laterality: N/A;   LEFT HEART CATH AND CORONARY ANGIOGRAPHY N/A 09/20/2019   Procedure: LEFT HEART CATH AND CORONARY ANGIOGRAPHY;  Surgeon: Kathleene Hazel, MD;  Location: [facility] INVASIVE CV LAB;  Service: Cardiovascular;  Laterality: N/A;   SEPTOPLASTY     w/ bilateral inferior turbinate reductions   TONSILLECTOMY      Current Outpatient Medications  Medication  Instructions   carvedilol (COREG) 6.25 mg, Oral, 2 times daily with meals   cetirizine (ZYRTEC) 10 mg, Oral, Daily   cholecalciferol (VITAMIN D3) 1,000 Units, Oral, Daily   clopidogrel (PLAVIX) 75 MG tablet TAKE 1 TABLET BY MOUTH EVERY DAY WITH BREAKFAST   dapagliflozin propanediol (FARXIGA) 10 mg, Oral, Daily   ezetimibe-simvastatin (VYTORIN) 10-10 MG tablet 1 tablet, Oral, Daily   glipiZIDE (GLUCOTROL XL) 10 mg, Oral, Daily with breakfast   isosorbide mononitrate (IMDUR) 30 mg, Oral, Daily   lisinopril (ZESTRIL) 20 mg, Oral, Daily   Multiple Vitamin (MULTIVITAMIN) tablet 1 tablet, Oral, Daily,     Na Sulfate-K Sulfate-Mg Sulf 17.5-3.13-1.6 GM/177ML SOLN Use as directed; may use generic; goodrx card if insurance will not cover generic   nitroGLYCERIN (NITROSTAT) 0.4 MG SL tablet PLACE 1 TABLET UNDER THE TONGUE EVERY 5 (FIVE) MINUTES X 3 DOSES AS NEEDED FOR CHEST PAIN.   pantoprazole (PROTONIX) 40 MG tablet TAKE 1 TABLET TWICE DAILY  BEFORE MEALS   pioglitazone-metformin (ACTOPLUS MET) 15-850 MG tablet 1 tablet, Oral, 2 times daily with meals   tirzepatide (MOUNJARO) 2.5 mg, Subcutaneous, Weekly       Objective:   Physical Exam BP 126/66   Pulse 70   Temp 98.4 F (36.9 C) (Oral)   Resp 16   Ht 5\' 10"  (1.778 m)   Wt 244 lb 6 oz (110.8  kg)   SpO2 96%   BMI 35.06 kg/m  General:   Well developed, NAD, BMI noted. HEENT:  Normocephalic . Face symmetric, atraumatic MSK: Right hip normal Left hip: Range of motion normal with no pain.  Slightly TTP at the left trochanteric bursa. Skin: Not pale. Not jaundice Neurologic:  alert & oriented X3.  Speech normal, gait appropriate for age and unassisted.  Straight leg test negative.  Motor symmetric. Psych--  Cognition and judgment appear intact.  Cooperative with normal attention span and concentration.  Behavior appropriate. No anxious or depressed appearing.      Assessment    Assessment DM DX 2008 HTN Hyperlipidemia CAD:   -MI 2001, syncope 2010 -07-2017: SOB, low risk stress test -Catheterization 09/2019: one stent  -Myocardial perfusion scan ~ 03/2021, minimal ischemia OSA, CPAP intolerant ED GI/anemia: --Mild chronic anemia, low iron stores.  Diabetes, history of anemia --Normal colonoscopy 2014, EGD 03/2019: H. pylori negative.   --Saw GI 04-2017: no further w/u  --Transglutaminase IgA and IgM normal 08-2020 --GERD --Fatty liver 2010 Used to see  Dr Terri Piedra Prostatitis/UTI: recurrent , at some point saw urology.   PLAN Left hip pain: On clinical grounds suspect trochanteric bursitis. Recommend topical Voltaren, Tylenol, ice or heat, the patient takes sporadically ibuprofen and that is okay.  Take it with food. Will call if no better, sports medicine referral?. DM: Patient stopped Tradjenta and started [facility] last month to obtain better DM control. To have a colonoscopy soon, was recommended to skip 1 Mounjaro, that is okay. RTC already scheduled.

## 2023-02-22 NOTE — Assessment & Plan Note (Signed)
Left hip pain: On clinical grounds suspect trochanteric bursitis. Recommend topical Voltaren, Tylenol, ice or heat, the patient takes sporadically ibuprofen and that is okay.  Take it with food. Will call if no better, sports medicine referral?. DM: Patient stopped Tradjenta and started [facility] last month to obtain better DM control. To have a colonoscopy soon, was recommended to skip 1 Mounjaro, that is okay. RTC already scheduled.

## 2023-02-25 ENCOUNTER — Encounter: Payer: Self-pay | Admitting: Internal Medicine

## 2023-02-26 ENCOUNTER — Ambulatory Visit (INDEPENDENT_AMBULATORY_CARE_PROVIDER_SITE_OTHER): Payer: Medicare HMO | Admitting: Internal Medicine

## 2023-02-26 ENCOUNTER — Telehealth: Payer: Self-pay

## 2023-02-26 ENCOUNTER — Encounter: Payer: Self-pay | Admitting: Internal Medicine

## 2023-02-26 VITALS — BP 126/68 | HR 88 | Temp 98.0°F | Resp 16 | Ht 70.0 in | Wt 244.4 lb

## 2023-02-26 DIAGNOSIS — Z7985 Long-term (current) use of injectable non-insulin antidiabetic drugs: Secondary | ICD-10-CM | POA: Diagnosis not present

## 2023-02-26 DIAGNOSIS — E118 Type 2 diabetes mellitus with unspecified complications: Secondary | ICD-10-CM

## 2023-02-26 DIAGNOSIS — N410 Acute prostatitis: Secondary | ICD-10-CM

## 2023-02-26 DIAGNOSIS — R399 Unspecified symptoms and signs involving the genitourinary system: Secondary | ICD-10-CM

## 2023-02-26 DIAGNOSIS — N390 Urinary tract infection, site not specified: Secondary | ICD-10-CM | POA: Diagnosis not present

## 2023-02-26 LAB — URINALYSIS, ROUTINE W REFLEX MICROSCOPIC
Ketones, ur: 15 — AB
Nitrite: NEGATIVE
Specific Gravity, Urine: 1.025 (ref 1.000–1.030)
Urine Glucose: >=1000 — AB
Urobilinogen, UA: 0.2 (ref 0.0–1.0)
pH: 5.5 (ref 5.0–8.0)

## 2023-02-26 LAB — POC URINALSYSI DIPSTICK (AUTOMATED)
Glucose, UA: POSITIVE — AB
Ketones, UA: POSITIVE
Nitrite, UA: NEGATIVE
Protein, UA: POSITIVE — AB
Spec Grav, UA: 1.015 (ref 1.010–1.025)
Urobilinogen, UA: 0.2 E.U./dL
pH, UA: 5 (ref 5.0–8.0)

## 2023-02-26 LAB — PSA: PSA: 53.92 ng/mL — ABNORMAL HIGH (ref 0.10–4.00)

## 2023-02-26 LAB — CBC WITH DIFFERENTIAL/PLATELET
Basophils Absolute: 0.1 10*3/uL (ref 0.0–0.1)
Basophils Relative: 0.7 % (ref 0.0–3.0)
Eosinophils Absolute: 0 10*3/uL (ref 0.0–0.7)
Eosinophils Relative: 0 % (ref 0.0–5.0)
HCT: 40.7 % (ref 39.0–52.0)
Hemoglobin: 13.3 g/dL (ref 13.0–17.0)
Lymphocytes Relative: 4.1 % — ABNORMAL LOW (ref 12.0–46.0)
Lymphs Abs: 0.8 10*3/uL (ref 0.7–4.0)
MCHC: 32.6 g/dL (ref 30.0–36.0)
MCV: 100.8 fl — ABNORMAL HIGH (ref 78.0–100.0)
Monocytes Absolute: 1.4 10*3/uL — ABNORMAL HIGH (ref 0.1–1.0)
Monocytes Relative: 7.5 % (ref 3.0–12.0)
Neutro Abs: 16.5 10*3/uL — ABNORMAL HIGH (ref 1.4–7.7)
Neutrophils Relative %: 87.7 % — ABNORMAL HIGH (ref 43.0–77.0)
Platelets: 193 10*3/uL (ref 150.0–400.0)
RBC: 4.04 Mil/uL — ABNORMAL LOW (ref 4.22–5.81)
RDW: 15.1 % (ref 11.5–15.5)
WBC: 18.8 10*3/uL (ref 4.0–10.5)

## 2023-02-26 LAB — BASIC METABOLIC PANEL
BUN: 20 mg/dL (ref 6–23)
CO2: 23 mEq/L (ref 19–32)
Calcium: 9 mg/dL (ref 8.4–10.5)
Chloride: 98 mEq/L (ref 96–112)
Creatinine, Ser: 1.19 mg/dL (ref 0.40–1.50)
GFR: 60.29 mL/min (ref >60.00)
Glucose, Bld: 131 mg/dL — ABNORMAL HIGH (ref 70–99)
Potassium: 4.5 mEq/L (ref 3.5–5.1)
Sodium: 134 mEq/L — ABNORMAL LOW (ref 135–145)

## 2023-02-26 MED ORDER — CIPROFLOXACIN HCL 500 MG PO TABS
500.0000 mg | ORAL_TABLET | Freq: Two times a day (BID) | ORAL | 0 refills | Status: DC
Start: 2023-02-26 — End: 2023-02-28

## 2023-02-26 MED ORDER — TIRZEPATIDE 5 MG/0.5ML ~~LOC~~ SOAJ: 5.0000 mg | SUBCUTANEOUS | 0 refills | Status: DC

## 2023-02-26 NOTE — Patient Instructions (Addendum)
Drink plenty of fluids  Start antibiotic called ciprofloxacin  Call or seek medical attention if your urinary symptoms increase, you have fever or chills.  Stop [place].  Increase Mounjaro to 5 mg weekly.  Keep an eye on your blood sugars      GO TO THE LAB : Get the blood work     GO TO THE FRONT DESK, PLEASE SCHEDULE YOUR APPOINTMENTS Come back for checkup in 2 months

## 2023-02-26 NOTE — Telephone Encounter (Signed)
Noted.  Thank you.  He is not toxic or critically ill.  Will wait for the rest of the labs.

## 2023-02-26 NOTE — Telephone Encounter (Signed)
CRITICAL VALUE STICKER  CRITICAL VALUE: WBC 18.8  RECEIVER (on-site recipient of call): Shanda Bumps   DATE & TIME NOTIFIED: 4:00pm 02/26/2023  MESSENGER (representative from lab): Clydie Braun   MD NOTIFIED: Dr. Drue Novel  TIME OF NOTIFICATION: 4:01pm   RESPONSE:

## 2023-02-26 NOTE — Progress Notes (Unsigned)
Subjective:    Patient ID: Daniel Cruz, male    DOB: 1951/05/18, 72 y.o.   MRN: [id number]  DOS:  02/26/2023 Type of visit - description: acute   Symptoms started 4 days ago with dysuria and mild difficulty urinating. No hematuria, no testicular pain or swelling. No genital rash. He has some chills last night but no fever. Has some lower abdominal discomfort but no nausea or vomiting.   Review of Systems See above   Past Medical History:  Diagnosis Date   Anemia, iron deficiency    hx   CAD (coronary artery disease)     NSTEMI with PCI of the mid and distal RCA with 50-60% LAD 09/2000, s/p cath 01/2009 with patent RCA stent and 90% LAD s/p PCI of LAD and right PDA   Cholelithiasis    DIABETES MELLITUS, TYPE II 02/26/2007   Fatty liver 02/2009   per u/s   GERD (gastroesophageal reflux disease)    HTN (hypertension)    Myocardial infarction (HCC)    2001   OSA (obstructive sleep apnea)    can't tol. a CPAP   Plantar fasciitis of left foot    PVC (premature ventricular contraction) 06/06/2017   Sleep apnea    UTI (lower urinary tract infection)    in the 80s and 11/09    Past Surgical History:  Procedure Laterality Date   CARDIAC CATHETERIZATION  10/2008   stents   CHOLECYSTECTOMY N/A 07/16/2016   Procedure: LAPAROSCOPIC CHOLECYSTECTOMY WITH INTRAOPERATIVE CHOLANGIOGRAM;  Surgeon: Harriette Bouillon, MD;  Location: [facility] OR;  Service: General;  Laterality: N/A;   CORONARY STENT INTERVENTION N/A 09/20/2019   Procedure: CORONARY STENT INTERVENTION;  Surgeon: Kathleene Hazel, MD;  Location: [facility] INVASIVE CV LAB;  Service: Cardiovascular;  Laterality: N/A;   LEFT HEART CATH AND CORONARY ANGIOGRAPHY N/A 09/20/2019   Procedure: LEFT HEART CATH AND CORONARY ANGIOGRAPHY;  Surgeon: Kathleene Hazel, MD;  Location: [facility] INVAS[facility] CV LAB;  Service: Cardiovascular;  Laterality: N/A;   SEPTOPLASTY     w/ bilateral inferior turbinate reductions   TONSILLECTOMY       Current Outpatient Medications  Medication Instructions   carvedilol (COREG) 6.25 mg, Oral, 2 times daily with meals   cetirizine (ZYRTEC) 10 mg, Oral, Daily   cholecalciferol (VITAMIN D3) 1,000 Units, Oral, Daily   clopidogrel (PLAVIX) 75 MG tablet TAKE 1 TABLET BY MOUTH EVERY DAY WITH BREAKFAST   dapagliflozin propanediol (FARXIGA) 10 mg, Oral, Daily   ezetimibe-simvastatin (VYTORIN) 10-10 MG tablet 1 tablet, Oral, Daily   glipiZIDE (GLUCOTROL XL) 10 mg, Oral, Daily with breakfast   isosorbide mononitrate (IMDUR) 30 mg, Oral, Daily   lisinopril (ZESTRIL) 20 mg, Oral, Daily   Multiple Vitamin (MULTIVITAMIN) tablet 1 tablet, Oral, Daily,     Na Sulfate-K Sulfate-Mg Sulf 17.5-3.13-1.6 GM/177ML SOLN Use as directed; may use generic; goodrx card if insurance will not cover generic   nitroGLYCERIN (NITROSTAT) 0.4 MG SL tablet PLACE 1 TABLET UNDER THE TONGUE EVERY 5 (FIVE) MINUTES X 3 DOSES AS NEEDED FOR CHEST PAIN.   pantoprazole (PROTONIX) 40 MG tablet TAKE 1 TABLET TWICE DAILY  BEFORE MEALS   pioglitazone-metformin (ACTOPLUS MET) 15-850 MG tablet 1 tablet, Oral, 2 times daily with meals   tirzepatide (MOUNJARO) 2.5 mg, Subcutaneous, Weekly       Objective:   Physical Exam BP 126/68   Pulse 88   Temp 98 F (36.7 C) (Oral)   Resp 16   Ht 5\' 10"  (1.778 m)  Wt 244 lb 6 oz (110.8 kg)   SpO2 95%   BMI 35.06 kg/m  General:   Well developed, NAD, BMI noted.  HEENT:  Normocephalic . Face symmetric, atraumatic Abdomen:  Not distended, soft, non-tender. No rebound or rigidity.  No CVA tenderness Skin: Not pale. Not jaundice Lower extremities: no pretibial edema bilaterally  Neurologic:  alert & oriented X3.  Speech normal, gait appropriate for age and unassisted Psych--  Cognition and judgment appear intact.  Cooperative with normal attention span and concentration.  Behavior appropriate. No anxious or depressed appearing.     Assessment    Assessment DM DX  2008 HTN Hyperlipidemia CAD:  -MI 2001, syncope 2010 -07-2017: SOB, low risk stress test -Catheterization 09/2019: one stent  -Myocardial perfusion scan ~ 03/2021, minimal ischemia OSA, CPAP intolerant ED GI/anemia: --Mild chronic anemia, low iron stores.  Diabetes, history of anemia --Normal colonoscopy 2014, EGD 03/2019: H. pylori negative.   --Saw GI 04-2017: no further w/u  --Transglutaminase IgA and IgM normal 08-2020 --GERD --Fatty liver 2010 Used to see  Dr Terri Piedra Prostatitis/UTI: recurrent , at some point saw urology.   PLAN Recurrent UTIs, history of prostatitis: Presents today with symptoms consistent with a UTI.  U dip show with trace leukocytes. On clinical grounds suspected UTI. DRE deferred but will check a PSA. On chart review, had a prostatitis episode 12/11/2021, PSA was 23,Urine culture showed Klebsiella. 09/2022 DRE and PSA were normal Plan: UA urine culture PSA CBC.  Start Cipro for now .  Cultures pending. Increase fluid intake, seek attention if fever chills or severe symptoms. DM: In light of recurrent UTIs,  will stop [place]. A1c 3 weeks ago was 7.1, was switch from [place] to [facility] which he is tolerating well, increase Mounjaro to 5 mg weekly.  Also continue with Actos plus met and Glucotrol.  Reassess on RTC.  Watch blood sugars carefully HTN: Continue carvedilol, lisinopril.  Check BMP RTC 2 months

## 2023-02-27 NOTE — Assessment & Plan Note (Signed)
Recurrent UTIs, history of prostatitis: Presents today with symptoms consistent with a UTI.  U dip show with trace leukocytes. On clinical grounds suspected UTI. DRE deferred but will check a PSA. On chart review, had a prostatitis episode 12/11/2021, PSA was 23,Urine culture showed Klebsiella. 09/2022 DRE and PSA were normal Plan: UA urine culture PSA CBC.  Start Cipro for now .  Cultures pending. Increase fluid intake, seek attention if fever chills or severe symptoms. DM: In light of recurrent UTIs,  will stop [place]. A1c 3 weeks ago was 7.1, was switch from [place] to [facility] which he is tolerating well, increase Mounjaro to 5 mg weekly.  Also continue with Actos plus met and Glucotrol.  Reassess on RTC.  Watch blood sugars carefully HTN: Continue carvedilol, lisinopril.  Check BMP RTC 2 months

## 2023-02-27 NOTE — Telephone Encounter (Signed)
PSA 53.9.  Creatinine 1.1.  Urine culture pending. Called patient, left message: Advised to postpone colonoscopy given acute prostatitis with elevated white count.

## 2023-02-28 LAB — URINE CULTURE
MICRO NUMBER:: 14989980
SPECIMEN QUALITY:: ADEQUATE

## 2023-02-28 MED ORDER — SULFAMETHOXAZOLE-TRIMETHOPRIM 800-160 MG PO TABS: 1.0000 | ORAL_TABLET | Freq: Two times a day (BID) | ORAL | 0 refills | Status: DC

## 2023-02-28 NOTE — Telephone Encounter (Signed)
Addendum: Urine culture came back, Pansensitive E. coli. Call patient:  -Recommend to switch from Cipro to Bactrim DS 1 p.o. twice daily, send  #28 no refills. - Refer to urology, Dx prostatitis - Appointment with me in July.  Sooner if needed.

## 2023-02-28 NOTE — Addendum Note (Signed)
Addended byConrad Strafford D on: 02/28/2023 01:49 PM   Modules accepted: Orders

## 2023-02-28 NOTE — Telephone Encounter (Signed)
LMOM informing Pt of recommendations. Rx for Bactrim sent. Urology referral placed.

## 2023-03-02 ENCOUNTER — Other Ambulatory Visit: Payer: Self-pay | Admitting: Internal Medicine

## 2023-03-06 ENCOUNTER — Telehealth: Payer: Self-pay

## 2023-03-06 ENCOUNTER — Encounter: Payer: Medicare HMO | Admitting: Internal Medicine

## 2023-03-06 NOTE — Telephone Encounter (Signed)
Spoke to patient rescheduled colonoscopy to 04/17/23 at 11 am sent patient updated procedure instructions via mail and mychart. Patient is aware and verbalized understanding.

## 2023-03-10 ENCOUNTER — Encounter: Payer: Self-pay | Admitting: Internal Medicine

## 2023-03-10 ENCOUNTER — Other Ambulatory Visit: Payer: Self-pay | Admitting: Internal Medicine

## 2023-03-10 MED ORDER — SULFAMETHOXAZOLE-TRIMETHOPRIM 800-160 MG PO TABS: 1.0000 | ORAL_TABLET | Freq: Two times a day (BID) | ORAL | 0 refills | Status: DC

## 2023-03-10 NOTE — Telephone Encounter (Signed)
Had Cipro for 1 week and Bactrim for 2 weeks.  Appropriate to complete a total of 4 weeks.  Will send additional Bactrim

## 2023-03-19 ENCOUNTER — Ambulatory Visit: Payer: Medicare HMO | Admitting: Internal Medicine

## 2023-03-24 ENCOUNTER — Encounter: Payer: Self-pay | Admitting: Internal Medicine

## 2023-03-24 DIAGNOSIS — N401 Enlarged prostate with lower urinary tract symptoms: Secondary | ICD-10-CM | POA: Diagnosis not present

## 2023-03-24 DIAGNOSIS — N410 Acute prostatitis: Secondary | ICD-10-CM | POA: Diagnosis not present

## 2023-03-24 DIAGNOSIS — R3911 Hesitancy of micturition: Secondary | ICD-10-CM | POA: Diagnosis not present

## 2023-03-24 DIAGNOSIS — R972 Elevated prostate specific antigen [PSA]: Secondary | ICD-10-CM | POA: Diagnosis not present

## 2023-03-24 MED ORDER — TIRZEPATIDE 7.5 MG/0.5ML ~~LOC~~ SOAJ: 7.5000 mg | SUBCUTANEOUS | 0 refills | Status: DC

## 2023-04-07 ENCOUNTER — Ambulatory Visit: Payer: Medicare HMO | Admitting: Internal Medicine

## 2023-04-07 DIAGNOSIS — H52223 Regular astigmatism, bilateral: Secondary | ICD-10-CM | POA: Diagnosis not present

## 2023-04-07 DIAGNOSIS — H40013 Open angle with borderline findings, low risk, bilateral: Secondary | ICD-10-CM | POA: Diagnosis not present

## 2023-04-07 DIAGNOSIS — E113392 Type 2 diabetes mellitus with moderate nonproliferative diabetic retinopathy without macular edema, left eye: Secondary | ICD-10-CM | POA: Diagnosis not present

## 2023-04-07 DIAGNOSIS — H25813 Combined forms of age-related cataract, bilateral: Secondary | ICD-10-CM | POA: Diagnosis not present

## 2023-04-07 DIAGNOSIS — H35371 Puckering of macula, right eye: Secondary | ICD-10-CM | POA: Diagnosis not present

## 2023-04-09 ENCOUNTER — Telehealth: Payer: Self-pay | Admitting: Internal Medicine

## 2023-04-09 NOTE — Telephone Encounter (Signed)
Inbound call from patient stating that he is scheduled to have a procedure with Dr. Leonides Schanz on 7/11 and was told that he would hear from our office a week before his procedure regarding when he was supposed to stop Plavix. Patient states he has not heard anything and is needing to know when he needs to stop taking it. Please advise.

## 2023-04-11 ENCOUNTER — Telehealth: Payer: Self-pay

## 2023-04-11 NOTE — Telephone Encounter (Signed)
Spoke to patient advised it is okay to hold Plavix 5 day's before procedure. Patient wanted to know if he can take his diabetic medication advised Glipizide,Pioglitazone-Metformin patient can take it the day before but do not take it the day of procedure. Also advised patient needs to hold Mounjaro 7 days prior to the procedure. Patient verbalized understanding all questions was answered.

## 2023-04-15 ENCOUNTER — Encounter: Payer: Self-pay | Admitting: Internal Medicine

## 2023-04-15 ENCOUNTER — Ambulatory Visit (INDEPENDENT_AMBULATORY_CARE_PROVIDER_SITE_OTHER): Payer: Medicare HMO | Admitting: Internal Medicine

## 2023-04-15 VITALS — BP 126/62 | HR 66 | Temp 97.6°F | Resp 18 | Ht 70.0 in | Wt 237.0 lb

## 2023-04-15 DIAGNOSIS — D7589 Other specified diseases of blood and blood-forming organs: Secondary | ICD-10-CM | POA: Diagnosis not present

## 2023-04-15 DIAGNOSIS — N390 Urinary tract infection, site not specified: Secondary | ICD-10-CM | POA: Diagnosis not present

## 2023-04-15 DIAGNOSIS — Z7984 Long term (current) use of oral hypoglycemic drugs: Secondary | ICD-10-CM | POA: Diagnosis not present

## 2023-04-15 DIAGNOSIS — Z7985 Long-term (current) use of injectable non-insulin antidiabetic drugs: Secondary | ICD-10-CM | POA: Diagnosis not present

## 2023-04-15 DIAGNOSIS — E118 Type 2 diabetes mellitus with unspecified complications: Secondary | ICD-10-CM | POA: Diagnosis not present

## 2023-04-15 LAB — HEMOGLOBIN A1C: Hgb A1c MFr Bld: 6.8 % — ABNORMAL HIGH (ref 4.6–6.5)

## 2023-04-15 LAB — MICROALBUMIN / CREATININE URINE RATIO
Creatinine,U: 187.5 mg/dL
Microalb Creat Ratio: 0.9 mg/g (ref 0.0–30.0)
Microalb, Ur: 1.7 mg/dL (ref 0.0–1.9)

## 2023-04-15 MED ORDER — ALFUZOSIN HCL ER 10 MG PO TB24: 10.0000 mg | ORAL_TABLET | Freq: Every day | ORAL | 0 refills | Status: DC

## 2023-04-15 NOTE — Progress Notes (Unsigned)
Subjective:    Patient ID: Daniel Cruz, male    DOB: 1950-10-18, 72 y.o.   MRN: [id number]  DOS:  04/15/2023 Type of visit - description: f/u  Recently diagnosed with UTI and prostatitis, had antibiotics, saw urology, note reviewed.  Was prescribed tamsulosin, took it temporarily, stopped due to side effects.  It did help.  Since he  stopped tamsulosin has again a slow urine stream. No dysuria no gross hematuria no fever or chills.  Review of Systems See above   Past Medical History:  Diagnosis Date   Anemia, iron deficiency    hx   CAD (coronary artery disease)     NSTEMI with PCI of the mid and distal RCA with 50-60% LAD 09/2000, s/p cath 01/2009 with patent RCA stent and 90% LAD s/p PCI of LAD and right PDA   Cholelithiasis    DIABETES MELLITUS, TYPE II 02/26/2007   Fatty liver 02/2009   per u/s   GERD (gastroesophageal reflux disease)    HTN (hypertension)    Myocardial infarction (HCC)    2001   OSA (obstructive sleep apnea)    can't tol. a CPAP   Plantar fasciitis of left foot    PVC (premature ventricular contraction) 06/06/2017   Sleep apnea    UTI (lower urinary tract infection)    in the 80s and 11/09    Past Surgical History:  Procedure Laterality Date   CARDIAC CATHETERIZATION  10/2008   stents   CHOLECYSTECTOMY N/A 07/16/2016   Procedure: LAPAROSCOPIC CHOLECYSTECTOMY WITH INTRAOPERATIVE CHOLANGIOGRAM;  Surgeon: Harriette Bouillon, MD;  Location: [facility] OR;  Service: General;  Laterality: N/A;   CORONARY STENT INTERVENTION N/A 09/20/2019   Procedure: CORONARY STENT INTERVENTION;  Surgeon: Kathleene Hazel, MD;  Location: [facility] LAB;  Service: Cardiovascular;  Laterality: N/A;   LEFT HEART CATH AND CORONARY ANGIOGRAPHY N/A 09/20/2019   Procedure: LEFT HEART CATH AND CORONARY ANGIOGRAPHY;  Surgeon: Kathleene Hazel, MD;  Location: [facility];  Service: Cardiovascular;  Laterality: N/A;   SEPTOPLASTY     w/ bilateral inferior  turbinate reductions   TONSILLECTOMY      Current Outpatient Medications  Medication Instructions   carvedilol (COREG) 6.25 mg, Oral, 2 times daily with meals   cetirizine (ZYRTEC) 10 mg, Oral, Daily   cholecalciferol (VITAMIN D3) 1,000 Units, Oral, Daily   clopidogrel (PLAVIX) 75 MG tablet TAKE 1 TABLET BY MOUTH EVERY DAY WITH BREAKFAST   ezetimibe-simvastatin (VYTORIN) 10-10 MG tablet 1 tablet, Oral, Daily   glipiZIDE (GLUCOTROL XL) 10 mg, Oral, Daily with breakfast   isosorbide mononitrate (IMDUR) 30 mg, Oral, Daily   lisinopril (ZESTRIL) 20 mg, Oral, Daily   Multiple Vitamin (MULTIVITAMIN) tablet 1 tablet, Oral, Daily,     Na Sulfate-K Sulfate-Mg Sulf 17.5-3.13-1.6 GM/177ML SOLN Use as directed; may use generic; goodrx card if insurance will not cover generic   nitroGLYCERIN (NITROSTAT) 0.4 MG SL tablet PLACE 1 TABLET UNDER THE TONGUE EVERY 5 (FIVE) MINUTES X 3 DOSES AS NEEDED FOR CHEST PAIN.   pantoprazole (PROTONIX) 40 MG tablet TAKE 1 TABLET TWICE DAILY  BEFORE MEALS   pioglitazone-metformin (ACTOPLUS MET) 15-850 MG tablet 1 tablet, Oral, 2 times daily with meals   sulfamethoxazole-trimethoprim (BACTRIM DS) 800-160 MG tablet 1 tablet, Oral, 2 times daily   tirzepatide (MOUNJARO) 7.5 mg, Subcutaneous, Weekly       Objective:   Physical Exam BP 126/62   Pulse 66   Temp 97.6 F (36.4 C) (Oral)  Resp 18   Ht 5\' 10"  (1.778 m)   Wt 237 lb (107.5 kg)   SpO2 97%   BMI 34.01 kg/m  General:   Well developed, NAD, BMI noted. HEENT:  Normocephalic . Face symmetric, atraumatic Skin: Not pale. Not jaundice Neurologic:  alert & oriented X3.  Speech normal, gait appropriate for age and unassisted Psych--  Cognition and judgment appear intact.  Cooperative with normal attention span and concentration.  Behavior appropriate. No anxious or depressed appearing.      Assessment     Assessment DM DX 2008 - Off Farxiga due to recurrent UTIs. HTN Hyperlipidemia CAD:  -MI  2001, syncope 2010 -07-2017: SOB, low risk stress test -Catheterization 09/2019: one stent  -Myocardial perfusion scan ~ 03/2021, minimal ischemia OSA, CPAP intolerant ED GI/anemia: --Mild chronic anemia, low iron stores.  Diabetes, history of anemia --Normal colonoscopy 2014, EGD 03/2019: H. pylori negative.   --Saw GI 04-2017: no further w/u  --Transglutaminase IgA and IgM normal 08-2020 --GERD --Fatty liver 2010 Used to see  Dr Terri Piedra Prostatitis/UTI: recurrent , at some point saw urology. Increase MCV: Normal B12 and folic acid 2023  PLAN Recurrent UTIs, prostatitis.   See LOV, PSA went up to 53.  White count elevated, urine culture show E. coli.  Took Bactrim x 4 weeks. Saw urology, Rx tamsulosin, patient d/c d/t dizziness, nausea They recommended to recheck PSA by September 2024 Plan:  -Trial with Uroxatrol, watch for side effects. - Recommend to keep the appointment with urology. -Also recommend to communicate with his eye doctors about taking this medication scheduled to have lens implants next year. DM: Recently stopped Marcelline Deist due to recurrent UTIs, currently on glipizide, Mounjaro, Actos plus met.  No ambulatory CBGs.  Only once or twice a year has symptoms consistent with low blood sugar, not willing to do CBGs, we agreed to consider a CMG.  Encouraged to carry glucose with him in case he has symptoms. Check A1c and micro. Increase MCV: Normal B12 and folic acid 2023 Vaccine advice provided RTC 3 months

## 2023-04-15 NOTE — Patient Instructions (Addendum)
Go to the lab for blood work  Recommend Uroxatrol 1 tablet daily. Please talk with your eye doctor about tamsulosin and Uroxatrol before you start taking it. Watch for side effects   vaccines I recommend: Covid booster Flu shot this fall  Please bring or send [place] a copy of your Healthcare Power of Attorney for your chart.      GO TO THE FRONT DESK, PLEASE SCHEDULE YOUR APPOINTMENTS Come back for   checkup in 2-3  months

## 2023-04-16 NOTE — Assessment & Plan Note (Signed)
Recurrent UTIs, prostatitis.   See LOV, PSA went up to 53.  White count elevated, urine culture show E. coli.  Took Bactrim x 4 weeks. Saw urology, Rx tamsulosin, patient d/c d/t dizziness, nausea They recommended to recheck PSA by September 2024 Plan:  -Trial with Uroxatrol, watch for side effects. - Recommend to keep the appointment with urology. -Also recommend to communicate with his eye doctors about taking this medication scheduled to have lens implants next year. DM: Recently stopped Marcelline Deist due to recurrent UTIs, currently on glipizide, Mounjaro, Actos plus met.  No ambulatory CBGs.  Only once or twice a year has symptoms consistent with low blood sugar, not willing to do CBGs, we agreed to consider a CMG.  Encouraged to carry glucose with him in case he has symptoms. Check A1c and micro. Increase MCV: Normal B12 and folic acid 2023 Vaccine advice provided RTC 3 months

## 2023-04-17 ENCOUNTER — Encounter: Payer: Self-pay | Admitting: Internal Medicine

## 2023-04-17 ENCOUNTER — Ambulatory Visit (AMBULATORY_SURGERY_CENTER): Payer: Medicare HMO | Admitting: Internal Medicine

## 2023-04-17 VITALS — BP 121/61 | HR 55 | Temp 97.5°F | Resp 20 | Ht 70.0 in | Wt 248.0 lb

## 2023-04-17 DIAGNOSIS — D123 Benign neoplasm of transverse colon: Secondary | ICD-10-CM

## 2023-04-17 DIAGNOSIS — K760 Fatty (change of) liver, not elsewhere classified: Secondary | ICD-10-CM | POA: Diagnosis not present

## 2023-04-17 DIAGNOSIS — I493 Ventricular premature depolarization: Secondary | ICD-10-CM | POA: Diagnosis not present

## 2023-04-17 DIAGNOSIS — E119 Type 2 diabetes mellitus without complications: Secondary | ICD-10-CM | POA: Diagnosis not present

## 2023-04-17 DIAGNOSIS — Z1211 Encounter for screening for malignant neoplasm of colon: Secondary | ICD-10-CM | POA: Diagnosis not present

## 2023-04-17 DIAGNOSIS — G4733 Obstructive sleep apnea (adult) (pediatric): Secondary | ICD-10-CM | POA: Diagnosis not present

## 2023-04-17 DIAGNOSIS — K635 Polyp of colon: Secondary | ICD-10-CM | POA: Diagnosis not present

## 2023-04-17 MED ORDER — SODIUM CHLORIDE 0.9 % IV SOLN
500.0000 mL | Freq: Once | INTRAVENOUS | Status: DC
Start: 2023-04-17 — End: 2023-04-17

## 2023-04-17 NOTE — Progress Notes (Signed)
GASTROENTEROLOGY PROCEDURE H&P NOTE   Primary Care Physician: Wanda Plump, MD    Reason for Procedure:   Colon cancer screening  Plan:    Colonoscopy  Patient is appropriate for endoscopic procedure(s) in the ambulatory ([facility]) setting.  The nature of the procedure, as well as the risks, benefits, and alternatives were carefully and thoroughly reviewed with the patient. Ample time for discussion and questions allowed. The patient understood, was satisfied, and agreed to proceed.     HPI: Daniel Cruz is a 72 y.o. male who presents for colonoscopy for evaluation of colon cancer screening .  Patient was most recently seen in the Gastroenterology Clinic on 01/22/23.  No interval change in medical history since that appointment. Please refer to that note for full details regarding GI history and clinical presentation.   Past Medical History:  Diagnosis Date   Anemia, iron deficiency    hx   CAD (coronary artery disease)     NSTEMI with PCI of the mid and distal RCA with 50-60% LAD 09/2000, s/p cath 01/2009 with patent RCA stent and 90% LAD s/p PCI of LAD and right PDA   Cholelithiasis    DIABETES MELLITUS, TYPE II 02/26/2007   Fatty liver 02/2009   per u/s   GERD (gastroesophageal reflux disease)    HTN (hypertension)    Myocardial infarction (HCC)    2001   OSA (obstructive sleep apnea)    can't tol. a CPAP   Plantar fasciitis of left foot    PVC (premature ventricular contraction) 06/06/2017   Sleep apnea    UTI (lower urinary tract infection)    in the 80s and 11/09    Past Surgical History:  Procedure Laterality Date   CARDIAC CATHETERIZATION  10/2008   stents   CHOLECYSTECTOMY N/A 07/16/2016   Procedure: LAPAROSCOPIC CHOLECYSTECTOMY WITH INTRAOPERATIVE CHOLANGIOGRAM;  Surgeon: Harriette Bouillon, MD;  Location: [facility];  Service: General;  Laterality: N/A;   CORONARY STENT INTERVENTION N/A 09/20/2019   Procedure: CORONARY STENT INTERVENTION;  Surgeon: Kathleene Hazel, MD;  Location: [facility] INVASIVE CV LAB;  Service: Cardiovascular;  Laterality: N/A;   LEFT HEART CATH AND CORONARY ANGIOGRAPHY N/A 09/20/2019   Procedure: LEFT HEART CATH AND CORONARY ANGIOGRAPHY;  Surgeon: Kathleene Hazel, MD;  Location: [facility] INVASIVE CV LAB;  Service: Cardiovascular;  Laterality: N/A;   SEPTOPLASTY     w/ bilateral inferior turbinate reductions   TONSILLECTOMY      Prior to Admission medications   Medication Sig Start Date End Date Taking? Authorizing Provider  alfuzosin (UROXATRAL) 10 MG 24 hr tablet Take 1 tablet (10 mg total) by mouth daily with breakfast. 04/15/23   Wanda Plump, MD  carvedilol (COREG) 6.25 MG tablet TAKE 1 TABLET BY MOUTH TWICE A DAY WITH FOOD 10/09/22   Quintella Reichert, MD  cetirizine (ZYRTEC) 10 MG tablet Take 10 mg by mouth daily.    [provider]  cholecalciferol (VITAMIN D3) 25 MCG (1000 UNIT) tablet Take 1,000 Units by mouth daily.    [provider]  clopidogrel (PLAVIX) 75 MG tablet TAKE 1 TABLET BY MOUTH EVERY DAY WITH BREAKFAST Patient not taking: Reported on 04/15/2023 07/17/22   Quintella Reichert, MD  ezetimibe-simvastatin (VYTORIN) 10-10 MG tablet Take 1 tablet by mouth daily. 01/20/23   Wanda Plump, MD  glipiZIDE (GLUCOTROL XL) 10 MG 24 hr tablet Take 1 tablet (10 mg total) by mouth daily with breakfast. 03/04/23   Wanda Plump, MD  isosorbide mononitrate (IMDUR) 30 MG 24 hr tablet Take 1 tablet (30 mg total) by mouth daily. 05/22/22   Quintella Reichert, MD  lisinopril (ZESTRIL) 20 MG tablet Take 1 tablet (20 mg total) by mouth daily. 05/22/22   Quintella Reichert, MD  Multiple Vitamin (MULTIVITAMIN) tablet Take 1 tablet by mouth daily.    [provider]  Na Sulfate-K Sulfate-Mg Sulf 17.5-3.13-1.6 GM/177ML SOLN Use as directed; may use generic; goodrx card if insurance will not cover generic Patient not taking: Reported on 02/21/2023 01/22/23   Imogene Burn, MD  nitroGLYCERIN (NITROSTAT) 0.4 MG SL tablet PLACE 1  TABLET UNDER THE TONGUE EVERY 5 (FIVE) MINUTES X 3 DOSES AS NEEDED FOR CHEST PAIN. Patient not taking: Reported on 09/17/2022 07/02/21   Quintella Reichert, MD  pantoprazole (PROTONIX) 40 MG tablet TAKE 1 TABLET TWICE DAILY  BEFORE MEALS 03/10/23   Wanda Plump, MD  pioglitazone-metformin (ACTOPLUS MET) [phone] MG tablet Take 1 tablet by mouth 2 (two) times daily with a meal. 01/20/23   Paz, Nolon Rod, MD  tirzepatide [facility]) 7.5 MG/0.5ML Pen Inject 7.5 mg into the skin once a week. Patient not taking: Reported on 04/15/2023 03/24/23   Wanda Plump, MD    Current Outpatient Medications  Medication Sig Dispense Refill   alfuzosin (UROXATRAL) 10 MG 24 hr tablet Take 1 tablet (10 mg total) by mouth daily with breakfast. 30 tablet 0   carvedilol (COREG) 6.25 MG tablet TAKE 1 TABLET BY MOUTH TWICE A DAY WITH FOOD 180 tablet 2   cetirizine (ZYRTEC) 10 MG tablet Take 10 mg by mouth daily.     cholecalciferol (VITAMIN D3) 25 MCG (1000 UNIT) tablet Take 1,000 Units by mouth daily.     clopidogrel (PLAVIX) 75 MG tablet TAKE 1 TABLET BY MOUTH EVERY DAY WITH BREAKFAST (Patient not taking: Reported on 04/15/2023) 90 tablet 3   ezetimibe-simvastatin (VYTORIN) 10-10 MG tablet Take 1 tablet by mouth daily. 90 tablet 1   glipiZIDE (GLUCOTROL XL) 10 MG 24 hr tablet Take 1 tablet (10 mg total) by mouth daily with breakfast. 90 tablet 1   isosorbide mononitrate (IMDUR) 30 MG 24 hr tablet Take 1 tablet (30 mg total) by mouth daily. 90 tablet 3   lisinopril (ZESTRIL) 20 MG tablet Take 1 tablet (20 mg total) by mouth daily. 90 tablet 3   Multiple Vitamin (MULTIVITAMIN) tablet Take 1 tablet by mouth daily.     Na Sulfate-K Sulfate-Mg Sulf 17.5-3.13-1.6 GM/177ML SOLN Use as directed; may use generic; goodrx card if insurance will not cover generic (Patient not taking: Reported on 02/21/2023) 354 mL 0   nitroGLYCERIN (NITROSTAT) 0.4 MG SL tablet PLACE 1 TABLET UNDER THE TONGUE EVERY 5 (FIVE) MINUTES X 3 DOSES AS NEEDED FOR CHEST PAIN.  (Patient not taking: Reported on 09/17/2022) 25 tablet 1   pantoprazole (PROTONIX) 40 MG tablet TAKE 1 TABLET TWICE DAILY  BEFORE MEALS 180 tablet 1   pioglitazone-metformin (ACTOPLUS MET) 15-850 MG tablet Take 1 tablet by mouth 2 (two) times daily with a meal. 180 tablet 1   tirzepatide (MOUNJARO) 7.5 MG/0.5ML Pen Inject 7.5 mg into the skin once a week. (Patient not taking: Reported on 04/15/2023) 2 mL 0   Current Facility-Administered Medications  Medication Dose Route Frequency Provider Last Rate Last Admin   0.9 %  sodium chloride infusion  500 mL Intravenous Once Imogene Burn, MD        Allergies as of 04/17/2023 - Review Complete 04/17/2023  Allergen Reaction Noted   Marcelline Deist [dapagliflozin]  02/26/2023   Januvia [sitagliptin] Nausea Only and Other (See Comments) 05/08/2015    Family History  Problem Relation Age of Onset   Emphysema Mother        died   COPD Mother    Stroke Father    Coronary artery disease Father    Cancer Neg Hx        colon ,prostate   Diabetes Neg Hx    Colon cancer Neg Hx    Esophageal cancer Neg Hx    Rectal cancer Neg Hx    Stomach cancer Neg Hx    Prostate cancer Neg Hx     Social History   Socioeconomic History   Marital status: Married    Spouse name: Not on file   Number of children: 2   Years of education: Not on file   Highest education level: Not on file  Occupational History   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 [employer]  Tobacco Use   Smoking status: Former    Current packs/day: 0.00    Average packs/day: 1.5 packs/day for 16.0 years (24.0 ttl pk-yrs)    Types: Cigarettes    Start date: 07/08/1984    Quit date: 07/08/2000    Years since quitting: 22.7   Smokeless tobacco: Never   Tobacco comments:    2001  Vaping Use   Vaping status: Never Used  Substance and Sexual Activity   Alcohol use: Yes    Comment: socially   Drug use: No   Sexual activity: Yes  Other Topics Concern    Not on file  Social History Narrative   Lives in [place], [place]   Lives w/ wife, 2 children, 3 g-kids   Retired April 2017          Social Determinants of Health   Financial Resource Strain: Low Risk  (11/06/2022)   Overall Financial Resource Strain (CARDIA)    Difficulty of Paying Living Expenses: Not hard at all  Food Insecurity: No Food Insecurity (11/06/2022)   Hunger Vital Sign    Worried About Running Out of Food in the Last Year: Never true    Ran Out of Food in the Last Year: Never true  Transportation Needs: No Transportation Needs (11/06/2022)   PRAPARE - Administrator, Civil Service (Medical): No    Lack of Transportation (Non-Medical): No  Physical Activity: Sufficiently Active (11/06/2022)   Exercise Vital Sign    Days of Exercise per Week: 4 days    Minutes of Exercise per Session: 90 min  Stress: No Stress Concern Present (11/06/2022)   [employer] of Occupational Health - Occupational Stress Questionnaire    Feeling of Stress : Not at all  Social Connections: Unknown (04/08/2023)   Received from [facility]   Social Network    Social Network: Not on file  Intimate Partner Violence: Unknown (04/08/2023)   Received from [facility]   HITS    Physically Hurt: Not on file    Insult or Talk Down To: Not on file    Threaten Physical Harm: Not on file    Scream or Curse: Not on file    Physical Exam: Vital signs in last 24 hours: BP (!) 114/59   Pulse 67   Temp (!) 97.5 F (36.4 C)   Ht 5\' 10"  (1.778 m)   Wt 248 lb (112.5 kg)   SpO2 96%   BMI 35.58 kg/m  GEN: NAD EYE: Sclerae anicteric ENT: MMM CV: Non-tachycardic Pulm: No increased WOB GI: Soft NEURO:  Alert & Oriented   Eulah Pont, MD [facility]   04/17/2023 11:01 AM

## 2023-04-17 NOTE — Patient Instructions (Signed)
Resume Plavix tomorrow 04/18/23  YOU HAD AN ENDOSCOPIC PROCEDURE TODAY AT THE [facility]:   Refer to the procedure report that was given to you for any specific questions about what was found during the examination.  If the procedure report does not answer your questions, please call your gastroenterologist to clarify.  If you requested that your care partner not be given the details of your procedure findings, then the procedure report has been included in a sealed envelope for you to review at your convenience later.  YOU SHOULD EXPECT: Some feelings of bloating in the abdomen. Passage of more gas than usual.  Walking can help get rid of the air that was put into your GI tract during the procedure and reduce the bloating. If you had a lower endoscopy (such as a colonoscopy or flexible sigmoidoscopy) you may notice spotting of blood in your stool or on the toilet paper. If you underwent a bowel prep for your procedure, you may not have a normal bowel movement for a few days.  Please Note:  You might notice some irritation and congestion in your nose or some drainage.  This is from the oxygen used during your procedure.  There is no need for concern and it should clear up in a day or so.  SYMPTOMS TO REPORT IMMEDIATELY:  Following lower endoscopy (colonoscopy or flexible sigmoidoscopy):  Excessive amounts of blood in the stool  Significant tenderness or worsening of abdominal pains  Swelling of the abdomen that is new, acute  Fever of 100F or higher  For urgent or emergent issues, a gastroenterologist can be reached at any hour by calling [phone]. Do not use MyChart messaging for urgent concerns.    DIET:  We do recommend a small meal at first, but then you may proceed to your regular diet.  Drink plenty of fluids but you should avoid alcoholic beverages for 24 hours.  ACTIVITY:  You should plan to take it easy for the rest of today and you should NOT DRIVE or use heavy  machinery until tomorrow (because of the sedation medicines used during the test).    FOLLOW UP: Our staff will call the number listed on your records the next business day following your procedure.  We will call around 7:15- 8:00 am to check on you and address any questions or concerns that you may have regarding the information given to you following your procedure. If we do not reach you, we will leave a message.     If any biopsies were taken you will be contacted by phone or by letter within the next 1-3 weeks.  Please call us at [phone] if you have not heard about the biopsies in 3 weeks.    SIGNATURES/CONFIDENTIALITY: You and/or your care partner have signed paperwork which will be entered into your electronic medical record.  These signatures attest to the fact that that the information above on your After Visit Summary has been reviewed and is understood.  Full responsibility of the confidentiality of this discharge information lies with you and/or your care-partner.

## 2023-04-17 NOTE — Progress Notes (Signed)
Called to room to assist during endoscopic procedure.  Patient ID and intended procedure confirmed with present staff. Received instructions for my participation in the procedure from the performing physician.  

## 2023-04-17 NOTE — Progress Notes (Signed)
Vss nad trans to pacu 

## 2023-04-17 NOTE — Op Note (Addendum)
[facility] Patient Name: Daniel Cruz Procedure Date: 04/17/2023 11:47 AM MRN: [id number] Endoscopist: Madelyn Brunner [place] , , [id number] Age: 72 Referring MD:  Date of Birth: [birth year] Gender: Male Account #: [id number] Procedure:                Colonoscopy Indications:              Screening for colorectal malignant neoplasm Medicines:                Monitored Anesthesia Care Procedure:                Pre-Anesthesia Assessment:                           - Prior to the procedure, a History and Physical                            was performed, and patient medications and                            allergies were reviewed. The patient's tolerance of                            previous anesthesia was also reviewed. The risks                            and benefits of the procedure and the sedation                            options and risks were discussed with the patient.                            All questions were answered, and informed consent                            was obtained. Prior Anticoagulants: The patient has                            taken Plavix (clopidogrel), last dose was 6 days                            prior to procedure. ASA Grade Assessment: III - A                            patient with severe systemic disease. After                            reviewing the risks and benefits, the patient was                            deemed in satisfactory condition to undergo the                            procedure.  After obtaining informed consent, the colonoscope                            was passed under direct vision. Throughout the                            procedure, the patient's blood pressure, pulse, and                            oxygen saturations were monitored continuously. The                            CF [id number] #[id number] was introduced through the anus                            and advanced to the the  terminal ileum. The                            colonoscopy was performed without difficulty. The                            patient tolerated the procedure well. The quality                            of the bowel preparation was good. The terminal                            ileum, ileocecal valve, appendiceal orifice, and                            rectum were photographed. Scope In: 11:53:34 AM Scope Out: 12:11:06 PM Scope Withdrawal Time: 0 hours 12 minutes 22 seconds  Total Procedure Duration: 0 hours 17 minutes 32 seconds  Findings:                 The terminal ileum appeared normal.                           A 3 mm polyp was found in the transverse colon. The                            polyp was sessile. The polyp was removed with a                            cold snare. Resection and retrieval were complete.                           Multiple diverticula were found in the sigmoid                            colon.                           Non-bleeding internal hemorrhoids were found during  retroflexion. Complications:            No immediate complications. Estimated Blood Loss:     Estimated blood loss was minimal. Impression:               - The examined portion of the ileum was normal.                           - One 3 mm polyp in the transverse colon, removed                            with a cold snare. Resected and retrieved.                           - Diverticulosis in the sigmoid colon.                           - Non-bleeding internal hemorrhoids. Recommendation:           - Discharge patient to home (with escort).                           - Await pathology results.                           - Okay to restart your Plavix tomorrow.                           - The findings and recommendations were discussed                            with the patient. Dr Particia Lather "Alan Ripper" Leonides Schanz,  04/17/2023 12:15:45 PM

## 2023-04-18 ENCOUNTER — Telehealth: Payer: Self-pay | Admitting: *Deleted

## 2023-04-18 NOTE — Telephone Encounter (Signed)
  Follow up Call-     04/17/2023   10:55 AM  Call back number  Post procedure Call Back phone  # [phone]  Permission to leave phone message Yes     Patient questions:  Do you have a fever, pain , or abdominal swelling? No. Pain Score  0 *  Have you tolerated food without any problems? Yes.    Have you been able to return to your normal activities? Yes.    Do you have any questions about your discharge instructions: Diet   No. Medications  No. Follow up visit  No.  Do you have questions or concerns about your Care? No.  Actions: * If pain score is 4 or above: No action needed, pain <4.

## 2023-04-22 ENCOUNTER — Encounter: Payer: Self-pay | Admitting: Internal Medicine

## 2023-05-05 ENCOUNTER — Encounter: Payer: Self-pay | Admitting: Internal Medicine

## 2023-05-05 MED ORDER — TIRZEPATIDE 10 MG/0.5ML ~~LOC~~ SOAJ: 10.0000 mg | SUBCUTANEOUS | 0 refills | Status: DC

## 2023-05-05 NOTE — Telephone Encounter (Signed)
Would you like to increase to [facility] 10mg ?

## 2023-05-05 NOTE — Telephone Encounter (Signed)
Send Mounjaro 10 mg, please explain the patient that we will go gradually up on the dose

## 2023-05-05 NOTE — Addendum Note (Signed)
Addended byConrad Talihina D on: 05/05/2023 04:36 PM   Modules accepted: Orders

## 2023-05-07 ENCOUNTER — Other Ambulatory Visit: Payer: Self-pay | Admitting: Internal Medicine

## 2023-05-12 ENCOUNTER — Ambulatory Visit: Payer: Medicare HMO | Admitting: Internal Medicine

## 2023-05-16 DIAGNOSIS — H25813 Combined forms of age-related cataract, bilateral: Secondary | ICD-10-CM | POA: Diagnosis not present

## 2023-05-16 DIAGNOSIS — H52223 Regular astigmatism, bilateral: Secondary | ICD-10-CM | POA: Diagnosis not present

## 2023-05-22 DIAGNOSIS — E1136 Type 2 diabetes mellitus with diabetic cataract: Secondary | ICD-10-CM | POA: Diagnosis not present

## 2023-05-22 DIAGNOSIS — H52223 Regular astigmatism, bilateral: Secondary | ICD-10-CM | POA: Diagnosis not present

## 2023-05-22 DIAGNOSIS — H40013 Open angle with borderline findings, low risk, bilateral: Secondary | ICD-10-CM | POA: Diagnosis not present

## 2023-05-22 DIAGNOSIS — E113392 Type 2 diabetes mellitus with moderate nonproliferative diabetic retinopathy without macular edema, left eye: Secondary | ICD-10-CM | POA: Diagnosis not present

## 2023-05-22 DIAGNOSIS — Z79899 Other long term (current) drug therapy: Secondary | ICD-10-CM | POA: Diagnosis not present

## 2023-05-22 DIAGNOSIS — Z7984 Long term (current) use of oral hypoglycemic drugs: Secondary | ICD-10-CM | POA: Diagnosis not present

## 2023-05-22 DIAGNOSIS — E785 Hyperlipidemia, unspecified: Secondary | ICD-10-CM | POA: Diagnosis not present

## 2023-05-22 DIAGNOSIS — I10 Essential (primary) hypertension: Secondary | ICD-10-CM | POA: Diagnosis not present

## 2023-05-22 DIAGNOSIS — H35371 Puckering of macula, right eye: Secondary | ICD-10-CM | POA: Diagnosis not present

## 2023-05-22 DIAGNOSIS — I2510 Atherosclerotic heart disease of native coronary artery without angina pectoris: Secondary | ICD-10-CM | POA: Diagnosis not present

## 2023-05-22 DIAGNOSIS — H25812 Combined forms of age-related cataract, left eye: Secondary | ICD-10-CM | POA: Diagnosis not present

## 2023-05-22 DIAGNOSIS — H25813 Combined forms of age-related cataract, bilateral: Secondary | ICD-10-CM | POA: Diagnosis not present

## 2023-05-22 DIAGNOSIS — K219 Gastro-esophageal reflux disease without esophagitis: Secondary | ICD-10-CM | POA: Diagnosis not present

## 2023-05-23 ENCOUNTER — Other Ambulatory Visit: Payer: Self-pay | Admitting: Cardiology

## 2023-06-02 ENCOUNTER — Encounter: Payer: Self-pay | Admitting: Internal Medicine

## 2023-06-02 MED ORDER — TIRZEPATIDE 12.5 MG/0.5ML ~~LOC~~ SOAJ: 12.5000 mg | SUBCUTANEOUS | 0 refills | Status: DC

## 2023-06-06 ENCOUNTER — Other Ambulatory Visit: Payer: Self-pay | Admitting: Internal Medicine

## 2023-06-06 MED ORDER — TIRZEPATIDE 12.5 MG/0.5ML ~~LOC~~ SOAJ: 12.5000 mg | SUBCUTANEOUS | 0 refills | Status: DC

## 2023-06-13 ENCOUNTER — Encounter (HOSPITAL_BASED_OUTPATIENT_CLINIC_OR_DEPARTMENT_OTHER): Payer: Self-pay | Admitting: Cardiology

## 2023-06-14 ENCOUNTER — Other Ambulatory Visit: Payer: Self-pay | Admitting: Cardiology

## 2023-06-17 ENCOUNTER — Encounter: Payer: Self-pay | Admitting: Internal Medicine

## 2023-06-17 DIAGNOSIS — B349 Viral infection, unspecified: Secondary | ICD-10-CM | POA: Diagnosis not present

## 2023-06-17 DIAGNOSIS — U071 COVID-19: Secondary | ICD-10-CM | POA: Diagnosis not present

## 2023-06-18 ENCOUNTER — Other Ambulatory Visit: Payer: Self-pay | Admitting: Cardiology

## 2023-06-23 ENCOUNTER — Encounter (HOSPITAL_BASED_OUTPATIENT_CLINIC_OR_DEPARTMENT_OTHER): Payer: Self-pay | Admitting: Cardiology

## 2023-06-24 DIAGNOSIS — R350 Frequency of micturition: Secondary | ICD-10-CM | POA: Diagnosis not present

## 2023-06-24 DIAGNOSIS — N410 Acute prostatitis: Secondary | ICD-10-CM | POA: Diagnosis not present

## 2023-06-24 DIAGNOSIS — N401 Enlarged prostate with lower urinary tract symptoms: Secondary | ICD-10-CM | POA: Diagnosis not present

## 2023-06-25 ENCOUNTER — Other Ambulatory Visit: Payer: Self-pay | Admitting: Cardiology

## 2023-07-02 ENCOUNTER — Other Ambulatory Visit: Payer: Self-pay | Admitting: Cardiology

## 2023-07-03 NOTE — Assessment & Plan Note (Addendum)
Hx of NSTEMI tx with BMS x 2 to RCA, s/p PCI to LAD in 2010 and DES to RI in 09/2019. He has a known CTO of the LAD. Myoview in 2022 demonstrated scar with peri-infarct ischemia.  He is doing well without chest discomfort to suggest angina. Meds will be adjusted due to low BP. -Decrease Carvedilol to 3.125 mg twice daily  -Continue Imdur 30 mg once daily, Clopidogrel 75 mg once daily, Vytorin 10/10 mg once daily, NTG prn -Follow up 1 year

## 2023-07-04 ENCOUNTER — Encounter: Payer: Self-pay | Admitting: Physician Assistant

## 2023-07-04 ENCOUNTER — Ambulatory Visit: Payer: Medicare HMO | Attending: Physician Assistant | Admitting: Physician Assistant

## 2023-07-04 VITALS — BP 100/62 | HR 66 | Ht 70.0 in | Wt 225.6 lb

## 2023-07-04 DIAGNOSIS — E118 Type 2 diabetes mellitus with unspecified complications: Secondary | ICD-10-CM

## 2023-07-04 DIAGNOSIS — I10 Essential (primary) hypertension: Secondary | ICD-10-CM

## 2023-07-04 DIAGNOSIS — I2510 Atherosclerotic heart disease of native coronary artery without angina pectoris: Secondary | ICD-10-CM

## 2023-07-04 DIAGNOSIS — E7800 Pure hypercholesterolemia, unspecified: Secondary | ICD-10-CM | POA: Diagnosis not present

## 2023-07-04 LAB — BASIC METABOLIC PANEL
BUN/Creatinine Ratio: 17 (ref 10–24)
BUN: 13 mg/dL (ref 8–27)
CO2: 24 mmol/L (ref 20–29)
Calcium: 9.5 mg/dL (ref 8.6–10.2)
Chloride: 104 mmol/L (ref 96–106)
Creatinine, Ser: 0.78 mg/dL (ref 0.76–1.27)
Glucose: 106 mg/dL — ABNORMAL HIGH (ref 70–99)
Potassium: 4.5 mmol/L (ref 3.5–5.2)
Sodium: 139 mmol/L (ref 134–144)
eGFR: 94 mL/min/{1.73_m2} (ref >59)

## 2023-07-04 MED ORDER — LISINOPRIL 10 MG PO TABS
10.0000 mg | ORAL_TABLET | Freq: Every day | ORAL | 3 refills | Status: DC
Start: 2023-07-04 — End: 2023-10-10

## 2023-07-04 MED ORDER — ISOSORBIDE MONONITRATE ER 30 MG PO TB24: 30.0000 mg | ORAL_TABLET | Freq: Every day | ORAL | 3 refills | Status: DC

## 2023-07-04 MED ORDER — CARVEDILOL 3.125 MG PO TABS
3.1250 mg | ORAL_TABLET | Freq: Two times a day (BID) | ORAL | 3 refills | Status: DC
Start: 2023-07-04 — End: 2024-06-18

## 2023-07-04 NOTE — Assessment & Plan Note (Signed)
BP running low. This is due to weight loss in the setting of multiple BP meds. -Reduce Carvedilol to 3.125mg  twice daily. -Reduce Lisinopril to 10mg  daily. -Continue Imdur 30 mg once daily  -BMET today  -Monitor blood pressure and report if SBP < 100  -If he continues to lose, may need to reduce meds further.

## 2023-07-04 NOTE — Assessment & Plan Note (Signed)
LDL optimal in 09/2022 at 45. . -Continue Vytorin 10/10mg  daily.

## 2023-07-04 NOTE — Assessment & Plan Note (Signed)
Managed by primary care. 

## 2023-07-04 NOTE — Patient Instructions (Signed)
Medication Instructions:  Your physician has recommended you make the following change in your medication:   REDUCE Carvedilol(Coreg) to 3.125 taking 1 twice a day  REDUCE Lisinopril to 10 mg taking 1 daily  *If you need a refill on your cardiac medications before your next appointment, please call your pharmacy*   Lab Work: TODAY:  BMET   If you have labs (blood work) drawn today and your tests are completely normal, you will receive your results only by: MyChart Message (if you have MyChart) OR A paper copy in the mail If you have any lab test that is abnormal or we need to change your treatment, we will call you to review the results.   Testing/Procedures: None ordered   Follow-Up: At [facility], you and your health needs are our priority.  As part of our continuing mission to provide you with exceptional heart care, we have created designated Provider Care Teams.  These Care Teams include your primary Cardiologist (physician) and Advanced Practice Providers (APPs -  Physician Assistants and Nurse Practitioners) who all work together to provide you with the care you need, when you need it.  We recommend signing up for the patient portal called "MyChart".  Sign up information is provided on this After Visit Summary.  MyChart is used to connect with patients for Virtual Visits (Telemedicine).  Patients are able to view lab/test results, encounter notes, upcoming appointments, etc.  Non-urgent messages can be sent to your provider as well.   To learn more about what you can do with MyChart, go to [email or web address].    Your next appointment:   12 month(s)  Provider:   Armanda Magic, MD     Other Instructions

## 2023-07-06 ENCOUNTER — Other Ambulatory Visit: Payer: Self-pay | Admitting: Cardiology

## 2023-07-10 DIAGNOSIS — H25811 Combined forms of age-related cataract, right eye: Secondary | ICD-10-CM | POA: Diagnosis not present

## 2023-07-10 DIAGNOSIS — K219 Gastro-esophageal reflux disease without esophagitis: Secondary | ICD-10-CM | POA: Diagnosis not present

## 2023-07-10 DIAGNOSIS — E1136 Type 2 diabetes mellitus with diabetic cataract: Secondary | ICD-10-CM | POA: Diagnosis not present

## 2023-07-10 DIAGNOSIS — I10 Essential (primary) hypertension: Secondary | ICD-10-CM | POA: Diagnosis not present

## 2023-07-10 DIAGNOSIS — I2510 Atherosclerotic heart disease of native coronary artery without angina pectoris: Secondary | ICD-10-CM | POA: Diagnosis not present

## 2023-07-10 LAB — HM DIABETES EYE EXAM

## 2023-07-14 ENCOUNTER — Other Ambulatory Visit: Payer: Self-pay | Admitting: Internal Medicine

## 2023-07-16 ENCOUNTER — Ambulatory Visit: Payer: Medicare HMO | Admitting: Internal Medicine

## 2023-07-16 ENCOUNTER — Encounter: Payer: Self-pay | Admitting: Internal Medicine

## 2023-07-16 VITALS — BP 126/70 | HR 75 | Temp 97.8°F | Resp 16 | Ht 70.0 in | Wt 224.2 lb

## 2023-07-16 DIAGNOSIS — Z23 Encounter for immunization: Secondary | ICD-10-CM | POA: Diagnosis not present

## 2023-07-16 DIAGNOSIS — Z7985 Long-term (current) use of injectable non-insulin antidiabetic drugs: Secondary | ICD-10-CM | POA: Diagnosis not present

## 2023-07-16 DIAGNOSIS — I10 Essential (primary) hypertension: Secondary | ICD-10-CM | POA: Diagnosis not present

## 2023-07-16 DIAGNOSIS — E118 Type 2 diabetes mellitus with unspecified complications: Secondary | ICD-10-CM | POA: Diagnosis not present

## 2023-07-16 LAB — POCT GLUCOSE (DEVICE FOR HOME USE): Glucose Fasting, POC: 121 mg/dL — AB (ref 70–99)

## 2023-07-16 MED ORDER — TIRZEPATIDE 12.5 MG/0.5ML ~~LOC~~ SOAJ: 12.5000 mg | SUBCUTANEOUS | 0 refills | Status: DC

## 2023-07-16 NOTE — Assessment & Plan Note (Signed)
DM  currently on glipizide, Mounjaro, Actos plus met.  No amb CBGs, no CGM. He had to hold Mounjaro x 3 doses due to recent cataract surgery. CBG today.  121 fasting. Plan: Continue present care HTN: c/o dizzines to cardiology, they decreased carvedilol-lisinopril doses, sxs resolved.  Ambulatory BPs remain very good. Acute prostatitis, BPH. Saw urology 06/24/2023.  PSA back to 2.4 on 06/17/2023.  Next PSA 1 year Preventive care: Flu shot today. Recommend RSV. Recommend COVID booster in 2 months (had COVID 06/2023) RTC December 2024  CPX

## 2023-07-16 NOTE — Patient Instructions (Addendum)
Vaccines I recommend: Covid booster-in a couple months from today.   Check the  blood pressure regularly Blood pressure goal:  between 110/65 and  135/85. If it is consistently higher or lower, let me know     Next visit with me: December 2024 for a physical exam     Please schedule it at the front desk

## 2023-07-16 NOTE — Progress Notes (Signed)
Subjective:    Patient ID: Daniel Cruz, male    DOB: Jan 30, 1951, 72 y.o.   MRN: [id number]  DOS:  07/16/2023 Type of visit - description: f/u  Routine checkup, chronic medical problems addressed. He is actually feeling very well. Notes from cardiology and urology reviewed  Review of Systems See above   Past Medical History:  Diagnosis Date   Anemia, iron deficiency    hx   CAD (coronary artery disease)     NSTEMI with PCI of the mid and distal RCA with 50-60% LAD 09/2000, s/p cath 01/2009 with patent RCA stent and 90% LAD s/p PCI of LAD and right PDA   Cholelithiasis    DIABETES MELLITUS, TYPE II 02/26/2007   Fatty liver 02/2009   per u/s   GERD (gastroesophageal reflux disease)    HTN (hypertension)    Myocardial infarction (HCC)    2001   OSA (obstructive sleep apnea)    can't tol. a CPAP   Plantar fasciitis of left foot    PVC (premature ventricular contraction) 06/06/2017   Sleep apnea    UTI (lower urinary tract infection)    in the 80s and 11/09    Past Surgical History:  Procedure Laterality Date   CARDIAC CATHETERIZATION  10/2008   stents   CATARACT EXTRACTION Bilateral    CHOLECYSTECTOMY N/A 07/16/2016   Procedure: LAPAROSCOPIC CHOLECYSTECTOMY WITH INTRAOPERATIVE CHOLANGIOGRAM;  Surgeon: Harriette Bouillon, MD;  Location: [facility] OR;  Service: General;  Laterality: N/A;   CORONARY STENT INTERVENTION N/A 09/20/2019   Procedure: CORONARY STENT INTERVENTION;  Surgeon: Kathleene Hazel, MD;  Location: [facility];  Service: Cardiovascular;  Laterality: N/A;   LEFT HEART CATH AND CORONARY ANGIOGRAPHY N/A 09/20/2019   Procedure: LEFT HEART CATH AND CORONARY ANGIOGRAPHY;  Surgeon: Kathleene Hazel, MD;  Location: [facility];  Service: Cardiovascular;  Laterality: N/A;   SEPTOPLASTY     w/ bilateral inferior turbinate reductions   TONSILLECTOMY      Current Outpatient Medications  Medication Instructions   alfuzosin (UROXATRAL) 10 mg,  Oral, Daily with breakfast   carvedilol (COREG) 3.125 mg, Oral, 2 times daily   cetirizine (ZYRTEC) 10 mg, Oral, Daily   cholecalciferol (VITAMIN D3) 1,000 Units, Oral, Daily   clopidogrel (PLAVIX) 75 MG tablet TAKE 1 TABLET BY MOUTH EVERY DAY WITH BREAKFAST   ezetimibe-simvastatin (VYTORIN) 10-10 MG tablet 1 tablet, Oral, Daily   glipiZIDE (GLUCOTROL XL) 10 mg, Oral, Daily with breakfast   isosorbide mononitrate (IMDUR) 30 mg, Oral, Daily   lisinopril (ZESTRIL) 10 mg, Oral, Daily   Multiple Vitamin (MULTIVITAMIN) tablet 1 tablet, Oral, Daily,     nitroGLYCERIN (NITROSTAT) 0.4 MG SL tablet PLACE 1 TABLET UNDER THE TONGUE EVERY 5 (FIVE) MINUTES X 3 DOSES AS NEEDED FOR CHEST PAIN.   pantoprazole (PROTONIX) 40 MG tablet TAKE 1 TABLET TWICE DAILY  BEFORE MEALS   pioglitazone-metformin (ACTOPLUS MET) 15-850 MG tablet 1 tablet, Oral, 2 times daily with meals   tirzepatide (MOUNJARO) 12.5 mg, Subcutaneous, Weekly       Objective:   Physical Exam BP 126/70   Pulse 75   Temp 97.8 F (36.6 C) (Oral)   Resp 16   Ht 5\' 10"  (1.778 m)   Wt 224 lb 4 oz (101.7 kg)   SpO2 93%   BMI 32.18 kg/m  General:   Well developed, NAD, BMI noted. HEENT:  Normocephalic . Face symmetric, atraumatic Lungs:  CTA B Normal respiratory effort, no intercostal retractions,  no accessory muscle use. Heart: RRR,  no murmur.  Lower extremities: no pretibial edema bilaterally  Skin: Not pale. Not jaundice Neurologic:  alert & oriented X3.  Speech normal, gait appropriate for age and unassisted Psych--  Cognition and judgment appear intact.  Cooperative with normal attention span and concentration.  Behavior appropriate. No anxious or depressed appearing.      Assessment      Assessment DM DX 2008 - Off Farxiga due to recurrent UTIs. HTN Hyperlipidemia CAD:  -MI 2001, syncope 2010 -07-2017: SOB, low risk stress test -Catheterization 09/2019: one stent  -Myocardial perfusion scan ~ 03/2021, minimal  ischemia OSA, CPAP intolerant ED GI/anemia: --Mild chronic anemia, low iron stores.  Diabetes, history of anemia --Normal colonoscopy 2014, EGD 03/2019: H. pylori negative.   --Saw GI 04-2017: no further w/u  --Transglutaminase IgA and IgM normal 08-2020 --GERD --Fatty liver 2010 Used to see  Dr Terri Piedra Prostatitis/UTI: recurrent , at some point saw urology. Increase MCV: Normal B12 and folic acid 2023  PLAN DM  currently on glipizide, Mounjaro, Actos plus met.  No amb CBGs, no CGM. He had to hold Mounjaro x 3 doses due to recent cataract surgery. CBG today.  121 fasting. Plan: Continue present care HTN: c/o dizzines to cardiology, they decreased carvedilol-lisinopril doses, sxs resolved.  Ambulatory BPs remain very good. Acute prostatitis, BPH. Saw urology 06/24/2023.  PSA back to 2.4 on 06/17/2023.  Next PSA 1 year Preventive care: Flu shot today. Recommend RSV. Recommend COVID booster in 2 months (had COVID 06/2023) RTC December 2024  CPX

## 2023-08-07 ENCOUNTER — Other Ambulatory Visit: Payer: Self-pay | Admitting: Internal Medicine

## 2023-08-11 ENCOUNTER — Encounter: Payer: Self-pay | Admitting: Internal Medicine

## 2023-08-11 MED ORDER — TIRZEPATIDE 15 MG/0.5ML ~~LOC~~ SOAJ: 15.0000 mg | SUBCUTANEOUS | 1 refills | Status: DC

## 2023-09-13 ENCOUNTER — Other Ambulatory Visit: Payer: Self-pay | Admitting: Internal Medicine

## 2023-09-24 ENCOUNTER — Ambulatory Visit (INDEPENDENT_AMBULATORY_CARE_PROVIDER_SITE_OTHER): Payer: Medicare HMO | Admitting: Internal Medicine

## 2023-09-24 ENCOUNTER — Encounter: Payer: Self-pay | Admitting: Internal Medicine

## 2023-09-24 VITALS — BP 116/68 | HR 74 | Temp 98.1°F | Resp 16 | Ht 70.0 in | Wt 212.1 lb

## 2023-09-24 DIAGNOSIS — I10 Essential (primary) hypertension: Secondary | ICD-10-CM

## 2023-09-24 DIAGNOSIS — E118 Type 2 diabetes mellitus with unspecified complications: Secondary | ICD-10-CM | POA: Diagnosis not present

## 2023-09-24 DIAGNOSIS — Z7985 Long-term (current) use of injectable non-insulin antidiabetic drugs: Secondary | ICD-10-CM

## 2023-09-24 DIAGNOSIS — Z0000 Encounter for general adult medical examination without abnormal findings: Secondary | ICD-10-CM

## 2023-09-24 DIAGNOSIS — Z0001 Encounter for general adult medical examination with abnormal findings: Secondary | ICD-10-CM

## 2023-09-24 DIAGNOSIS — Z7984 Long term (current) use of oral hypoglycemic drugs: Secondary | ICD-10-CM

## 2023-09-24 DIAGNOSIS — E7800 Pure hypercholesterolemia, unspecified: Secondary | ICD-10-CM

## 2023-09-24 LAB — CBC WITH DIFFERENTIAL/PLATELET
Basophils Absolute: 0 10*3/uL (ref 0.0–0.1)
Basophils Relative: 0.9 % (ref 0.0–3.0)
Eosinophils Absolute: 0.1 10*3/uL (ref 0.0–0.7)
Eosinophils Relative: 3.1 % (ref 0.0–5.0)
HCT: 39.9 % (ref 39.0–52.0)
Hemoglobin: 13.3 g/dL (ref 13.0–17.0)
Lymphocytes Relative: 18.8 % (ref 12.0–46.0)
Lymphs Abs: 0.8 10*3/uL (ref 0.7–4.0)
MCHC: 33.3 g/dL (ref 30.0–36.0)
MCV: 102.3 fL — ABNORMAL HIGH (ref 78.0–100.0)
Monocytes Absolute: 0.4 10*3/uL (ref 0.1–1.0)
Monocytes Relative: 8.8 % (ref 3.0–12.0)
Neutro Abs: 2.9 10*3/uL (ref 1.4–7.7)
Neutrophils Relative %: 68.4 % (ref 43.0–77.0)
Platelets: 200 10*3/uL (ref 150.0–400.0)
RBC: 3.9 Mil/uL — ABNORMAL LOW (ref 4.22–5.81)
RDW: 15 % (ref 11.5–15.5)
WBC: 4.2 10*3/uL (ref 4.0–10.5)

## 2023-09-24 LAB — LIPID PANEL
Cholesterol: 87 mg/dL (ref 0–200)
HDL: 40 mg/dL (ref >39.00)
LDL Cholesterol: 30 mg/dL (ref 0–99)
NonHDL: 46.72
Total CHOL/HDL Ratio: 2
Triglycerides: 82 mg/dL (ref 0.0–149.0)
VLDL: 16.4 mg/dL (ref 0.0–40.0)

## 2023-09-24 LAB — ALT: ALT: 15 U/L (ref 0–53)

## 2023-09-24 LAB — HEMOGLOBIN A1C: Hgb A1c MFr Bld: 6.3 % (ref 4.6–6.5)

## 2023-09-24 LAB — AST: AST: 22 U/L (ref 0–37)

## 2023-09-24 NOTE — Progress Notes (Unsigned)
Subjective:    Patient ID: Daniel Cruz, male    DOB: [birth year], 72 y.o.   MRN: [id number]  DOS:  09/24/2023 Type of visit - description: Here for CPX   Feeling very well. + Weight loss noted since he started [facility]. Has decreased food portions. Had a URI last week but he feels better.  Wt Readings from Last 3 Encounters:  09/24/23 212 lb 2 oz (96.2 kg)  07/16/23 224 lb 4 oz (101.7 kg)  07/04/23 225 lb 9.6 oz (102.3 kg)     Review of Systems See above   Past Medical History:  Diagnosis Date   Anemia, iron deficiency    hx   CAD (coronary artery disease)     NSTEMI with PCI of the mid and distal RCA with 50-60% LAD 09/2000, s/p cath 01/2009 with patent RCA stent and 90% LAD s/p PCI of LAD and right PDA   Cholelithiasis    DIABETES MELLITUS, TYPE II 02/26/2007   Fatty liver 02/2009   per u/s   GERD (gastroesophageal reflux disease)    HTN (hypertension)    Myocardial infarction (HCC)    2001   OSA (obstructive sleep apnea)    can't tol. a CPAP   Plantar fasciitis of left foot    PVC (premature ventricular contraction) 06/06/2017   Sleep apnea    UTI (lower urinary tract infection)    in the 80s and 11/09    Past Surgical History:  Procedure Laterality Date   CARDIAC CATHETERIZATION  10/2008   stents   CATARACT EXTRACTION Bilateral    CHOLECYSTECTOMY N/A 07/16/2016   Procedure: LAPAROSCOPIC CHOLECYSTECTOMY WITH INTRAOPERATIVE CHOLANGIOGRAM;  Surgeon: Harriette Bouillon, MD;  Location: [facility] OR;  Service: General;  Laterality: N/A;   CORONARY STENT INTERVENTION N/A 09/20/2019   Procedure: CORONARY STENT INTERVENTION;  Surgeon: Kathleene Hazel, MD;  Location: [facility];  Service: Cardiovascular;  Laterality: N/A;   LEFT HEART CATH AND CORONARY ANGIOGRAPHY N/A 09/20/2019   Procedure: LEFT HEART CATH AND CORONARY ANGIOGRAPHY;  Surgeon: Kathleene Hazel, MD;  Location: [facility];  Service: Cardiovascular;  Laterality: N/A;    SEPTOPLASTY     w/ bilateral inferior turbinate reductions   TONSILLECTOMY      Current Outpatient Medications  Medication Instructions   alfuzosin (UROXATRAL) 10 mg, Oral, Daily with breakfast   carvedilol (COREG) 3.125 mg, Oral, 2 times daily   cetirizine (ZYRTEC) 10 mg, Daily   cholecalciferol (VITAMIN D3) 1,000 Units, Daily   clopidogrel (PLAVIX) 75 MG tablet TAKE 1 TABLET BY MOUTH EVERY DAY WITH BREAKFAST   ezetimibe-simvastatin (VYTORIN) 10-10 MG tablet 1 tablet, Oral, Daily   glipiZIDE (GLUCOTROL XL) 10 mg, Oral, Daily with breakfast   isosorbide mononitrate (IMDUR) 30 mg, Oral, Daily   lisinopril (ZESTRIL) 10 mg, Oral, Daily   Multiple Vitamin (MULTIVITAMIN) tablet 1 tablet, Daily   nitroGLYCERIN (NITROSTAT) 0.4 MG SL tablet PLACE 1 TABLET UNDER THE TONGUE EVERY 5 (FIVE) MINUTES X 3 DOSES AS NEEDED FOR CHEST PAIN.   pantoprazole (PROTONIX) 40 MG tablet TAKE 1 TABLET TWICE DAILY  BEFORE MEALS   pioglitazone-metformin (ACTOPLUS MET) 15-850 MG tablet 1 tablet, Oral, 2 times daily with meals   tirzepatide (MOUNJARO) 15 mg, Subcutaneous, Weekly       Objective:   Physical Exam BP 116/68   Pulse 74   Temp 98.1 F (36.7 C) (Oral)   Resp 16   Ht 5\' 10"  (1.778 m)   Wt 212 lb 2  oz (96.2 kg)   SpO2 96%   BMI 30.44 kg/m  General: Well developed, NAD, BMI noted Neck: No  thyromegaly  HEENT:  Normocephalic . Face symmetric, atraumatic Lungs:  CTA B Normal respiratory effort, no intercostal retractions, no accessory muscle use. Heart: RRR,  no murmur.  Abdomen:  Not distended, soft, non-tender. No rebound or rigidity.   Lower extremities: no pretibial edema bilaterally  Skin: Exposed areas without rash. Not pale. Not jaundice Neurologic:  alert & oriented X3.  Speech normal, gait appropriate for age and unassisted Strength symmetric and appropriate for age.  Psych: Cognition and judgment appear intact.  Cooperative with normal attention span and concentration.   Behavior appropriate. No anxious or depressed appearing.     Assessment       Assessment DM DX 2008 - Off Farxiga due to recurrent UTIs. HTN Hyperlipidemia CAD:  -MI 2001, syncope 2010 -07-2017: SOB, low risk stress test -Catheterization 09/2019: one stent  -Myocardial perfusion scan ~ 03/2021, minimal ischemia OSA, CPAP intolerant ED GI/anemia: --Mild chronic anemia, low iron stores.  Diabetes, history of anemia --Normal colonoscopy 2014, EGD 03/2019: H. pylori negative.   --Saw GI 04-2017: no further w/u  --Transglutaminase IgA and IgM normal 08-2020 --GERD --Fatty liver 2010 Used to see  Dr Terri Piedra Prostatitis/UTI: recurrent , at some point saw urology. Increase MCV: Normal B12 and folic acid 2023  PLAN AST ALT FLP CBC A1c  Here for CPX - Td 12-2015 -  pneumonia 2008 and 2018; PNM 13: 2015; PNM 20: 2023 -  zostavax 2014; s/p shingrex  - had a flu shot   - Rec RSV, covid booster if not done recently  --CCS: had Cscope 2003 negative ,again in 2014 (Dr Randa Evens). Cscope 04/2023 next per GI -Prostate cancer screening: Had prostatitis, fully recuperated, last PSA few weeks ago WNL.  No symptoms. -- Diet-exercise: Doing great with diet, on Mounjaro, + weight loss, remains active. --labs:    AST ALT FLP CBC A1c -Healthcare POA info provided. DM: On glipizide, Mounjaro, Actos plus met, no ambulatory CBGs, doing great with diet, check A1c, consider decrease glipizide dose (or stop it) depending on A1c. HTN: Ambulatory BPs, BP is good today, patient would like to stop medications if possible, continue carvedilol, Imdur, consider decrease or stop lisinopril if BPs are consistently under 110.  Recommend to start checking. High cholesterol: On Vytorin, checking labs. CAD: On Plavix, no symptoms. RTC 4 months 10-24 DM  currently on glipizide, Mounjaro, Actos plus met.  No amb CBGs, no CGM. He had to hold Mounjaro x 3 doses due to recent cataract surgery. CBG today.  121  fasting. Plan: Continue present care HTN: c/o dizzines to cardiology, they decreased carvedilol-lisinopril doses, sxs resolved.  Ambulatory BPs remain very good. Acute prostatitis, BPH. Saw urology 06/24/2023.  PSA back to 2.4 on 06/17/2023.  Next PSA 1 year Preventive care: Flu shot today. Recommend RSV. Recommend COVID booster in 2 months (had COVID 06/2023) RTC December 2024  CPX

## 2023-09-24 NOTE — Patient Instructions (Addendum)
Vaccines I recommend: Covid booster RSV   Recommend to start checking your blood pressure regularly Blood pressure goal:  between 110/65 and  135/85. If it is consistently higher or lower, let me know     GO TO THE LAB : Get the blood work     Next visit with me in 4 months ; please schedule it at the front desk       "Health Care Power of attorney" ,  "Living will" (Advance care planning documents)  If you already have a living will or healthcare power of attorney, is recommended you bring the copy to be scanned in your chart.   The document will be available to all the doctors you see in the system.  Advance care planning is a process that supports adults in  understanding and sharing their preferences regarding future medical care.  The patient's preferences are recorded in documents called Advance Directives and the can be modified at any time while the patient is in full mental capacity.   If you don't have one, please consider create one.      More information at: [email or web address]

## 2023-09-25 ENCOUNTER — Encounter: Payer: Self-pay | Admitting: Internal Medicine

## 2023-09-25 NOTE — Assessment & Plan Note (Signed)
Here for CPX  DM: On glipizide, Mounjaro, Actos plus met, no ambulatory CBGs, doing great with diet, check A1c, consider decrease glipizide dose (or stop it) depending on A1c. HTN: Ambulatory BPs, BP is good today, patient would like to stop medications if possible, continue carvedilol, Imdur, consider decrease or stop lisinopril if BPs are consistently under 110.  Recommend to start checking. High cholesterol: On Vytorin, checking labs. CAD: On Plavix, no symptoms. RTC 4 months

## 2023-09-25 NOTE — Assessment & Plan Note (Signed)
Here for CPX - Td 12-2015 -  pneumonia 2008 and 2018; PNM 13: 2015; PNM 20: 2023;   zostavax 2014; s/p shingrex  - had a flu shot   - Rec RSV, covid booster if not done recently  --CCS: Cscope 2003 neg ,Cscope  2014 (Dr Randa Evens). Cscope 04/2023 next per GI -Prostate cancer screening: Had prostatitis, fully recuperated, last PSA few weeks ago WNL.  No symptoms. -- Diet-exercise: Doing great with diet, on Mounjaro, + weight loss, remains active. --labs:    AST ALT FLP CBC A1c -Healthcare POA info provided.

## 2023-09-27 ENCOUNTER — Encounter: Payer: Self-pay | Admitting: Internal Medicine

## 2023-09-27 ENCOUNTER — Other Ambulatory Visit: Payer: Self-pay | Admitting: Internal Medicine

## 2023-09-29 MED ORDER — PANTOPRAZOLE SODIUM 40 MG PO TBEC: 40.0000 mg | DELAYED_RELEASE_TABLET | Freq: Two times a day (BID) | ORAL | 1 refills | Status: DC

## 2023-10-09 MED ORDER — ALFUZOSIN HCL ER 10 MG PO TB24: 10.0000 mg | ORAL_TABLET | Freq: Every day | ORAL | 1 refills | Status: DC

## 2023-10-10 ENCOUNTER — Encounter: Payer: Self-pay | Admitting: Internal Medicine

## 2023-10-10 ENCOUNTER — Ambulatory Visit (INDEPENDENT_AMBULATORY_CARE_PROVIDER_SITE_OTHER): Payer: Medicare Other | Admitting: Internal Medicine

## 2023-10-10 VITALS — BP 124/62 | HR 69 | Ht 70.0 in | Wt 207.0 lb

## 2023-10-10 DIAGNOSIS — E118 Type 2 diabetes mellitus with unspecified complications: Secondary | ICD-10-CM

## 2023-10-10 DIAGNOSIS — I10 Essential (primary) hypertension: Secondary | ICD-10-CM | POA: Diagnosis not present

## 2023-10-10 DIAGNOSIS — Z7984 Long term (current) use of oral hypoglycemic drugs: Secondary | ICD-10-CM | POA: Diagnosis not present

## 2023-10-10 MED ORDER — GLIPIZIDE ER 5 MG PO TB24: 5.0000 mg | ORAL_TABLET | Freq: Every day | ORAL | 6 refills | Status: DC

## 2023-10-10 MED ORDER — LISINOPRIL 5 MG PO TABS
5.0000 mg | ORAL_TABLET | Freq: Every day | ORAL | 6 refills | Status: DC
Start: 2023-10-10 — End: 2023-10-21

## 2023-10-10 NOTE — Patient Instructions (Signed)
 Decrease lisinopril  to only 5 mg a day  Decrease Glucotrol  XL from 10 mg to 5 mg  Continue checking your blood pressure regularly Blood pressure goal:  between 110/65 and  135/85. If it is consistently higher or lower, let me know    Arrange for a nurse visit to be done in 10 days.  Bring your blood pressure machine.

## 2023-10-10 NOTE — Telephone Encounter (Signed)
 PCP's 4pm cancelled, Pt coming at 4pm today

## 2023-10-10 NOTE — Progress Notes (Signed)
 Subjective:    Patient ID: Daniel Cruz, male    DOB: June 21, 1951, 73 y.o.   MRN: [id number]  DOS:  10/10/2023 Type of visit - description: Acute  BP has been gradually dropping since he started Mounjaro . Lately it has been mostly in the 90s/60s.  He has not seen systolic BP in the 120s in a while. Occasionally feels dizzy particularly in the morning.  Denies chest pain or difficulty breathing. No nausea vomiting.  No blood in the stools.  Review of Systems See above   Past Medical History:  Diagnosis Date   Anemia, iron deficiency    hx   CAD (coronary artery disease)     NSTEMI with PCI of the mid and distal RCA with 50-60% LAD 09/2000, s/p cath 01/2009 with patent RCA stent and 90% LAD s/p PCI of LAD and right PDA   Cholelithiasis    DIABETES MELLITUS, TYPE II 02/26/2007   Fatty liver 02/2009   per u/s   GERD (gastroesophageal reflux disease)    HTN (hypertension)    Myocardial infarction (HCC)    2001   OSA (obstructive sleep apnea)    can't tol. a CPAP   Plantar fasciitis of left foot    PVC (premature ventricular contraction) 06/06/2017   Sleep apnea    UTI (lower urinary tract infection)    in the 80s and 11/09    Past Surgical History:  Procedure Laterality Date   CARDIAC CATHETERIZATION  10/2008   stents   CATARACT EXTRACTION Bilateral    CHOLECYSTECTOMY N/A 07/16/2016   Procedure: LAPAROSCOPIC CHOLECYSTECTOMY WITH INTRAOPERATIVE CHOLANGIOGRAM;  Surgeon: Debby Shipper, MD;  Location: [facility] OR;  Service: General;  Laterality: N/A;   CORONARY STENT INTERVENTION N/A 09/20/2019   Procedure: CORONARY STENT INTERVENTION;  Surgeon: Verlin Lonni BIRCH, MD;  Location: [facility];  Service: Cardiovascular;  Laterality: N/A;   LEFT HEART CATH AND CORONARY ANGIOGRAPHY N/A 09/20/2019   Procedure: LEFT HEART CATH AND CORONARY ANGIOGRAPHY;  Surgeon: Verlin Lonni BIRCH, MD;  Location: [facility];  Service: Cardiovascular;  Laterality: N/A;    SEPTOPLASTY     w/ bilateral inferior turbinate reductions   TONSILLECTOMY      Current Outpatient Medications  Medication Instructions   alfuzosin  (UROXATRAL ) 10 mg, Oral, Daily with breakfast   carvedilol  (COREG ) 3.125 mg, Oral, 2 times daily   cetirizine (ZYRTEC) 10 mg, Daily   cholecalciferol (VITAMIN D3) 1,000 Units, Daily   clopidogrel  (PLAVIX ) 75 MG tablet TAKE 1 TABLET BY MOUTH EVERY DAY WITH BREAKFAST   ezetimibe -simvastatin  (VYTORIN ) 10-10 MG tablet 1 tablet, Oral, Daily   glipiZIDE  (GLUCOTROL  XL) 5 mg, Oral, Daily with breakfast   isosorbide  mononitrate (IMDUR ) 30 mg, Oral, Daily   lisinopril  (ZESTRIL ) 5 mg, Oral, Daily   Multiple Vitamin (MULTIVITAMIN) tablet 1 tablet, Daily   nitroGLYCERIN  (NITROSTAT ) 0.4 MG SL tablet PLACE 1 TABLET UNDER THE TONGUE EVERY 5 (FIVE) MINUTES X 3 DOSES AS NEEDED FOR CHEST PAIN.   pantoprazole  (PROTONIX ) 40 mg, Oral, 2 times daily before meals   pioglitazone -metformin  (ACTOPLUS MET ) 15-850 MG tablet 1 tablet, Oral, 2 times daily with meals   tirzepatide  (MOUNJARO ) 15 mg, Subcutaneous, Weekly       Objective:   Physical Exam BP 124/62   Pulse 69   Ht 5' 10 (1.778 m)   Wt 207 lb (93.9 kg)   SpO2 96%   BMI 29.70 kg/m  General:   Well developed, NAD, BMI noted. HEENT:  Normocephalic .  Face symmetric, atraumatic Lungs:  CTA B Normal respiratory effort, no intercostal retractions, no accessory muscle use. Heart: RRR,  no murmur.  Lower extremities: no pretibial edema bilaterally  Skin: Not pale. Not jaundice Neurologic:  alert & oriented X3.  Speech normal, gait appropriate for age and unassisted Psych--  Cognition and judgment appear intact.  Cooperative with normal attention span and concentration.  Behavior appropriate. No anxious or depressed appearing.      Assessment       Assessment DM DX 2008 - Off Farxiga  due to recurrent UTIs. HTN Hyperlipidemia CAD:  -MI 2001, syncope 2010 -07-2017: SOB, low risk stress  test -Catheterization 09/2019: one stent  -Myocardial perfusion scan ~ 03/2021, minimal ischemia OSA, CPAP intolerant ED GI/anemia: --Mild chronic anemia, low iron stores.    --Normal colonoscopy 2014, EGD 03/2019: H. pylori negative.   --Saw GI 04-2017: no further w/u  --Transglutaminase IgA and IgM normal 08-2020 --GERD --Fatty liver 2010 Used to see  Dr Ivin Prostatitis/UTI: recurrent , at some point saw urology. Increase MCV: Normal B12 and folic acid  2023  PLAN HTN: Current medication carvedilol  3.125, Imdur , lisinopril . Ambulatory BPs are low in the 90s/60s.  Trend started after he started lose weight with Mounjaro . BP today, here is WNL he is not orthostatic.   He occasionally feels dizzy at home which could be related with low BPs (or low sugars) Plan: Decrease lisinopril  to 5 mg, prescription sent, continue monitor at home, call if recurrent symptoms, see my nurse for BP check in 10 days, bring his BP cuff.  DM: Last A1c 6.3, no changes were made, on Glucotrol , Actos  plus met, Mounjaro . Weight last month 212 pounds, weight today 207. Plan: Decrease glipizide  XR to only 5 mg daily.  Declined   to start checking CBGs. RTC nurse 10 days RTC will need scheduled for April

## 2023-10-10 NOTE — Telephone Encounter (Signed)
 Pt called requesting an appointment today with provider. Patient was advised by both [employer] agent and myself that Dr. Amon does not have any openings until next week. He refused urgent care and seeing other providers.Offered triage to patient as well so that he could be further evaluated but he was declined.  Pt was advised that CMA recommended an appt in our office but not that it needed to be done today. Pt requested to speak to provider directly. Advised provider is with patients and is unable to speak on the phone. CMA unavailable. Advised patient that I would send a message  to CMA to have provider give him a call back.

## 2023-10-11 NOTE — Assessment & Plan Note (Signed)
 HTN: Current medication carvedilol  3.125, Imdur , lisinopril . Ambulatory BPs are low in the 90s/60s.  Trend started after he started lose weight with Mounjaro . BP today, here is WNL he is not orthostatic.   He occasionally feels dizzy at home which could be related with low BPs (or low sugars) Plan: Decrease lisinopril  to 5 mg, prescription sent, continue monitor at home, call if recurrent symptoms, see my nurse for BP check in 10 days, bring his BP cuff.  DM: Last A1c 6.3, no changes were made, on Glucotrol , Actos  plus met, Mounjaro . Weight last month 212 pounds, weight today 207. Plan: Decrease glipizide  XR to only 5 mg daily.  Declined   to start checking CBGs. RTC nurse 10 days RTC will need scheduled for April

## 2023-10-21 ENCOUNTER — Ambulatory Visit: Payer: Medicare Other

## 2023-10-21 VITALS — BP 110/70 | HR 72

## 2023-10-21 DIAGNOSIS — I10 Essential (primary) hypertension: Secondary | ICD-10-CM

## 2023-10-21 NOTE — Progress Notes (Signed)
 Pt here for Blood pressure check per Dr Amon 10/10/23: Decrease lisinopril  to 5 mg, prescription sent, continue monitor at home, call if recurrent symptoms, see my nurse for BP check in 10 days, bring his BP cuff.   Pt currently takes: Lisinopril  5 mg Imdur  30 mg  Carvedilol  3.125 mg BID Pt reports compliance with medication.   Manual: BP =110/ 70 HR = 72  Automatic: (purchased about 2 weeks ago) BP =  112/64  L arm HR = 72 Average has been around:105-112/60-70   Pt advised per Dr Amon: D/C Lisinopril  at this time per discussion at last OV. Pt has been getting BP readings in low 100's. He agrees to continue to monitor and inform PCP if readings rise above 120/80. Pt advised to keep pending appt on 01/27/24.

## 2023-11-07 ENCOUNTER — Encounter: Payer: Self-pay | Admitting: Internal Medicine

## 2023-11-07 ENCOUNTER — Telehealth: Payer: Self-pay

## 2023-11-07 ENCOUNTER — Other Ambulatory Visit (HOSPITAL_COMMUNITY): Payer: Self-pay

## 2023-11-07 NOTE — Telephone Encounter (Signed)
PA approved.   Approved. Authorization Expiration Date: 11/06/2024

## 2023-11-07 NOTE — Telephone Encounter (Signed)
PA initiated via Covermymeds; KEY: [id number]. Awaiting determination.

## 2023-11-21 DIAGNOSIS — K080 Exfoliation of teeth due to systemic causes: Secondary | ICD-10-CM | POA: Diagnosis not present

## 2024-01-23 ENCOUNTER — Ambulatory Visit: Payer: Medicare HMO | Admitting: Internal Medicine

## 2024-01-24 ENCOUNTER — Other Ambulatory Visit: Payer: Self-pay | Admitting: Internal Medicine

## 2024-01-27 ENCOUNTER — Encounter: Payer: Self-pay | Admitting: Internal Medicine

## 2024-01-27 ENCOUNTER — Ambulatory Visit (INDEPENDENT_AMBULATORY_CARE_PROVIDER_SITE_OTHER): Payer: Medicare HMO | Admitting: Internal Medicine

## 2024-01-27 VITALS — BP 124/60 | HR 66 | Temp 98.1°F | Resp 16 | Ht 70.0 in | Wt 195.2 lb

## 2024-01-27 DIAGNOSIS — I10 Essential (primary) hypertension: Secondary | ICD-10-CM

## 2024-01-27 DIAGNOSIS — Z7985 Long-term (current) use of injectable non-insulin antidiabetic drugs: Secondary | ICD-10-CM | POA: Diagnosis not present

## 2024-01-27 DIAGNOSIS — E118 Type 2 diabetes mellitus with unspecified complications: Secondary | ICD-10-CM | POA: Diagnosis not present

## 2024-01-27 LAB — MICROALBUMIN / CREATININE URINE RATIO
Creatinine,U: 179.2 mg/dL
Microalb Creat Ratio: 13.3 mg/g (ref 0.0–30.0)
Microalb, Ur: 2.4 mg/dL — ABNORMAL HIGH (ref 0.0–1.9)

## 2024-01-27 LAB — BASIC METABOLIC PANEL WITH GFR
BUN: 11 mg/dL (ref 6–23)
CO2: 29 meq/L (ref 19–32)
Calcium: 9.1 mg/dL (ref 8.4–10.5)
Chloride: 105 meq/L (ref 96–112)
Creatinine, Ser: 0.79 mg/dL (ref 0.40–1.50)
GFR: 87.12 mL/min (ref >60.00)
Glucose, Bld: 101 mg/dL — ABNORMAL HIGH (ref 70–99)
Potassium: 4.2 meq/L (ref 3.5–5.1)
Sodium: 138 meq/L (ref 135–145)

## 2024-01-27 LAB — HEMOGLOBIN A1C: Hgb A1c MFr Bld: 6 % (ref 4.6–6.5)

## 2024-01-27 NOTE — Progress Notes (Unsigned)
 Subjective:    Patient ID: Daniel Cruz, male    DOB: [birth year], 73 y.o.   MRN: [id number]  DOS:  01/27/2024 Type of visit - description: Routine checkup  Since the last office visit is doing well. Good med compliance. Denies chest pain or difficulty breathing No nausea or vomiting. Occasionally he feels slightly dizzy for few seconds when he stands up.   Wt Readings from Last 3 Encounters:  01/27/24 195 lb 4 oz (88.6 kg)  10/10/23 207 lb (93.9 kg)  09/24/23 212 lb 2 oz (96.2 kg)     Review of Systems See above   Past Medical History:  Diagnosis Date   Anemia, iron deficiency    hx   CAD (coronary artery disease)     NSTEMI with PCI of the mid and distal RCA with 50-60% LAD 09/2000, s/p cath 01/2009 with patent RCA stent and 90% LAD s/p PCI of LAD and right PDA   Cholelithiasis    DIABETES MELLITUS, TYPE II 02/26/2007   Fatty liver 02/2009   per u/s   GERD (gastroesophageal reflux disease)    HTN (hypertension)    Myocardial infarction (HCC)    2001   OSA (obstructive sleep apnea)    can't tol. a CPAP   Plantar fasciitis of left foot    PVC (premature ventricular contraction) 06/06/2017   Sleep apnea    UTI (lower urinary tract infection)    in the 80s and 11/09    Past Surgical History:  Procedure Laterality Date   CARDIAC CATHETERIZATION  10/2008   stents   CATARACT EXTRACTION Bilateral    CHOLECYSTECTOMY N/A 07/16/2016   Procedure: LAPAROSCOPIC CHOLECYSTECTOMY WITH INTRAOPERATIVE CHOLANGIOGRAM;  Surgeon: Sim Dryer, MD;  Location: [facility] OR;  Service: General;  Laterality: N/A;   CORONARY STENT INTERVENTION N/A 09/20/2019   Procedure: CORONARY STENT INTERVENTION;  Surgeon: Odie Benne, MD;  Location: [facility] IN[facility] LAB;  Service: Cardiovascular;  Laterality: N/A;   LEFT HEART CATH AND CORONARY ANGIOGRAPHY N/A 09/20/2019   Procedure: LEFT HEART CATH AND CORONARY ANGIOGRAPHY;  Surgeon: Odie Benne, MD;  Location: [facility] INVAS[facility] CV  LAB;  Service: Cardiovascular;  Laterality: N/A;   SEPTOPLASTY     w/ bilateral inferior turbinate reductions   TONSILLECTOMY      Current Outpatient Medications  Medication Instructions   alfuzosin  (UROXATRAL ) 10 mg, Oral, Daily with breakfast   carvedilol  (COREG ) 3.125 mg, Oral, 2 times daily   cetirizine (ZYRTEC) 10 mg, Daily   cholecalciferol (VITAMIN D3) 1,000 Units, Daily   clopidogrel  (PLAVIX ) 75 MG tablet TAKE 1 TABLET BY MOUTH EVERY DAY WITH BREAKFAST   ezetimibe -simvastatin  (VYTORIN ) 10-10 MG tablet 1 tablet, Oral, Daily   glipiZIDE  (GLUCOTROL  XL) 5 mg, Oral, Daily with breakfast   isosorbide  mononitrate (IMDUR ) 30 mg, Oral, Daily   Mounjaro  15 mg, Subcutaneous, Weekly   Multiple Vitamin (MULTIVITAMIN) tablet 1 tablet, Daily   nitroGLYCERIN  (NITROSTAT ) 0.4 MG SL tablet PLACE 1 TABLET UNDER THE TONGUE EVERY 5 (FIVE) MINUTES X 3 DOSES AS NEEDED FOR CHEST PAIN.   pantoprazole  (PROTONIX ) 40 mg, Oral, 2 times daily before meals   pioglitazone -metformin  (ACTOPLUS MET ) 15-850 MG tablet 1 tablet, Oral, 2 times daily with meals       Objective:   Physical Exam BP 124/60   Pulse 66   Temp 98.1 F (36.7 C) (Oral)   Resp 16   Ht 5\' 10"  (1.778 m)   Wt 195 lb 4 oz (88.6 kg)   SpO2  95%   BMI 28.02 kg/m  General:   Well developed, NAD, BMI noted. HEENT:  Normocephalic . Face symmetric, atraumatic Lungs:  CTA B Normal respiratory effort, no intercostal retractions, no accessory muscle use. Heart: RRR,  no murmur.  DM foot exam: No edema, pinprick examination negative, good pulses. Skin: Not pale. Not jaundice Neurologic:  alert & oriented X3.  Speech normal, gait appropriate for age and unassisted Psych--  Cognition and judgment appear intact.  Cooperative with normal attention span and concentration.  Behavior appropriate. No anxious or depressed appearing.      Assessment    Assessment DM DX 2008 - Off Farxiga  due to recurrent UTIs. HTN Hyperlipidemia CAD:   -MI 2001, syncope 2010 -Catheterization 09/2019: one stent  -Myocardial perfusion scan ~ 03/2021, minimal ischemia OSA, CPAP intolerant ED GI/anemia: --Mild chronic anemia, low iron stores.    --Normal colonoscopy 2014, EGD 03/2019: H. pylori negative.   --Saw GI 04-2017: no further w/u  --Transglutaminase IgA and IgM normal 08-2020 --GERD --Fatty liver 2010 Used to see  Dr Fleurette Humbles Prostatitis/UTI: recurrent , at some point saw urology. Increase MCV: Normal B12 and folic acid  2023  PLAN HTN: Since the last visit, ambulatory BPs were in the low 100s, lisinopril  stopped 10/21/2023.  Since then BP ranges from 110-120.  Occasionally lightheaded. Plan: Continue carvedilol , check BMP, monitor BPs good hydration, call if lightheadedness increases DM: Currently on Glucotrol  XL 5, Mounjaro ,Actos  plus met.  No ambulatory CBGs.  Foot exam negative. Plan: Check A1c and micro.  Consider stop Glucotrol  XL.  If we do, recheck A1c in 3 months. RTC 6 months

## 2024-01-27 NOTE — Patient Instructions (Addendum)
 INSTRUCTIONS  FOR TODAY   Check the  blood pressure regularly Blood pressure goal:  between 110/65 and  135/85. If it is consistently higher or lower, let me know     GO TO THE LAB : Get the blood work     Next office visit for a checkup in 6 months. Please make an appointment before you leave today Depending on your blood or XRs results it might be necessary to come back sooner   Per our records you are soon due for your diabetic eye exam. Please contact your eye doctor to schedule an appointment. Please have them send copies of your office visit notes to us . Our fax number is [phone]. If you need a referral to an eye doctor please let us  know.

## 2024-01-28 NOTE — Assessment & Plan Note (Signed)
 HTN: Since the last visit, ambulatory BPs were in the low 100s, lisinopril  stopped 10/21/2023.  Since then BP ranges from 110-120.  Occasionally lightheaded. Plan: Continue carvedilol , check BMP, monitor BPs , good hydration, call if lightheadedness increases DM: Currently on Glucotrol  XL 5, Mounjaro ,Actos  plus met.  No ambulatory CBGs.  Foot exam negative. Plan: Check A1c and micro.  Consider stop Glucotrol  XL.  If we do, recheck A1c in 3 months. RTC 6 months

## 2024-01-29 ENCOUNTER — Encounter: Payer: Self-pay | Admitting: Internal Medicine

## 2024-03-21 ENCOUNTER — Other Ambulatory Visit: Payer: Self-pay | Admitting: Internal Medicine

## 2024-03-30 NOTE — Progress Notes (Signed)
 [facility] Quality Team Note  Name: Daniel Cruz Date of Birth: 12-19-1950 MRN: [id number] Date: 03/30/2024  [facility] Quality Team has reviewed this patient's chart, please see recommendations below:  [facility] Quality Other; (CHART REVIEWED FOR A1C, KIDNEY EVALUATION AND DIABETIC EYE EXAM. ABSTRACTED ALL MEASURES.)

## 2024-04-03 ENCOUNTER — Other Ambulatory Visit: Payer: Self-pay | Admitting: Internal Medicine

## 2024-05-12 ENCOUNTER — Encounter: Payer: Self-pay | Admitting: Internal Medicine

## 2024-05-12 MED ORDER — EZETIMIBE-SIMVASTATIN 10-10 MG PO TABS: 1.0000 | ORAL_TABLET | Freq: Every day | ORAL | 1 refills | Status: DC

## 2024-05-31 ENCOUNTER — Encounter: Payer: Self-pay | Admitting: Physician Assistant

## 2024-05-31 ENCOUNTER — Other Ambulatory Visit (HOSPITAL_COMMUNITY): Payer: Self-pay

## 2024-05-31 ENCOUNTER — Other Ambulatory Visit: Payer: Self-pay | Admitting: Physician Assistant

## 2024-05-31 ENCOUNTER — Ambulatory Visit (INDEPENDENT_AMBULATORY_CARE_PROVIDER_SITE_OTHER): Admitting: Physician Assistant

## 2024-05-31 ENCOUNTER — Telehealth: Payer: Self-pay

## 2024-05-31 VITALS — BP 132/61 | HR 80 | Temp 98.3°F | Ht 70.0 in | Wt 190.4 lb

## 2024-05-31 DIAGNOSIS — U071 COVID-19: Secondary | ICD-10-CM | POA: Diagnosis not present

## 2024-05-31 DIAGNOSIS — R051 Acute cough: Secondary | ICD-10-CM

## 2024-05-31 LAB — POC COVID19 BINAXNOW: SARS Coronavirus 2 Ag: POSITIVE — AB

## 2024-05-31 MED ORDER — HYDROCOD POLI-CHLORPHE POLI ER 10-8 MG/5ML PO SUER: 5.0000 mL | Freq: Every evening | ORAL | 0 refills | Status: DC | PRN

## 2024-05-31 MED ORDER — MOLNUPIRAVIR EUA 200MG CAPSULE: 4.0000 | ORAL_CAPSULE | Freq: Two times a day (BID) | ORAL | 0 refills | Status: DC

## 2024-05-31 NOTE — Progress Notes (Signed)
 Established patient visit   Patient: Daniel Cruz   DOB: [birth year]   73 y.o. Male  MRN: [id number] Visit Date: 05/31/2024  Today's healthcare provider: Manuelita Flatness, PA-C   Chief Complaint  Patient presents with   Headache   Sore Throat    Nasal drainage for about 2 months.  All other symptoms started Sunday   Cough    Took mucinex, vicks   Subjective     Pt reports PND x 2 mo but starting yesterday with cough, fatigue, body aches, congestion, sore throat. Denies fever, SOB, wheezing.   Medications: Outpatient Medications Prior to Visit  Medication Sig   alfuzosin  (UROXATRAL ) 10 MG 24 hr tablet Take 1 tablet (10 mg total) by mouth daily with breakfast.   carvedilol  (COREG ) 3.125 MG tablet Take 1 tablet (3.125 mg total) by mouth 2 (two) times daily.   cetirizine (ZYRTEC) 10 MG tablet Take 10 mg by mouth daily.   cholecalciferol (VITAMIN D3) 25 MCG (1000 UNIT) tablet Take 1,000 Units by mouth daily.   clopidogrel  (PLAVIX ) 75 MG tablet TAKE 1 TABLET BY MOUTH EVERY DAY WITH BREAKFAST   ezetimibe -simvastatin  (VYTORIN ) 10-10 MG tablet Take 1 tablet by mouth daily.   glipiZIDE  (GLUCOTROL  XL) 5 MG 24 hr tablet Take 1 tablet (5 mg total) by mouth daily with breakfast.   isosorbide  mononitrate (IMDUR ) 30 MG 24 hr tablet Take 1 tablet (30 mg total) by mouth daily.   Multiple Vitamin (MULTIVITAMIN) tablet Take 1 tablet by mouth daily.   nitroGLYCERIN  (NITROSTAT ) 0.4 MG SL tablet PLACE 1 TABLET UNDER THE TONGUE EVERY 5 (FIVE) MINUTES X 3 DOSES AS NEEDED FOR CHEST PAIN. (Patient not taking: Reported on 01/27/2024)   pantoprazole  (PROTONIX ) 40 MG tablet Take 1 tablet (40 mg total) by mouth 2 (two) times daily before a meal.   pioglitazone -metformin  (ACTOPLUS MET ) 15-850 MG tablet TAKE 1 TABLET TWICE DAILY  WITH MEALS   tirzepatide  (MOUNJARO ) 15 MG/0.5ML Pen Inject 15 mg into the skin once a week.   No facility-administered medications prior to visit.    Review of Systems   Constitutional:  Positive for fatigue. Negative for fever.  HENT:  Positive for congestion and sore throat.   Respiratory:  Positive for cough. Negative for shortness of breath and wheezing.   Cardiovascular:  Negative for chest pain, palpitations and leg swelling.  Neurological:  Negative for dizziness and headaches.       Objective    BP 132/61   Pulse 80   Temp 98.3 F (36.8 C)   Ht 5' 10 (1.778 m)   Wt 190 lb 6.4 oz (86.4 kg)   BMI 27.32 kg/m    Physical Exam Constitutional:      General: He is awake.     Appearance: He is well-developed.  HENT:     Head: Normocephalic.  Eyes:     Conjunctiva/sclera: Conjunctivae normal.  Cardiovascular:     Rate and Rhythm: Normal rate and regular rhythm.     Heart sounds: Normal heart sounds.  Pulmonary:     Effort: Pulmonary effort is normal.     Breath sounds: Normal breath sounds. No wheezing, rhonchi or rales.  Skin:    General: Skin is warm.  Neurological:     Mental Status: He is alert and oriented to person, place, and time.  Psychiatric:        Attention and Perception: Attention normal.        Mood and Affect: Mood normal.  Speech: Speech normal.        Behavior: Behavior is cooperative.      No results found for any visits on 05/31/24.  Assessment & Plan    COVID -     molnupiravir  EUA; Take 4 capsules (800 mg total) by mouth 2 (two) times daily for 5 days.  Dispense: 40 capsule; Refill: 0 -     Hydrocod Poli-Chlorphe Poli ER; Take 5 mLs by mouth at bedtime as needed.  Dispense: 115 mL; Refill: 0  Acute cough -     POC COVID-19 BinaxNow -     Hydrocod Poli-Chlorphe Poli ER; Take 5 mLs by mouth at bedtime as needed.  Dispense: 115 mL; Refill: 0   Pt unable to take paxlovid 2/2 to significant med interactions Rx molnupiravir , pt at higher risk 2/2 to age and comorbidiity.  Recommend rest, hydration, tylenol   Return if symptoms worsen or fail to improve.       Manuelita Flatness, PA-C  [facility] at [facility] [phone] (phone) [phone] (fax)  [facility]

## 2024-05-31 NOTE — Telephone Encounter (Signed)
 Copied from CRM #[id number]. Topic: Clinical - Prescription Issue >> May 31, 2024  2:46 PM Burnard DEL wrote: Reason for CRM: Patient called in stating that he does not wan the medication prescribed for covid.He would like the more generic version of the medication due to cost.

## 2024-05-31 NOTE — Telephone Encounter (Signed)
 PA request has been Submitted. New Encounter has been or will be created for follow up. For additional info see Pharmacy Prior Auth telephone encounter from 05/31/24.

## 2024-05-31 NOTE — Telephone Encounter (Signed)
 Seen by Manuelita today.

## 2024-05-31 NOTE — Telephone Encounter (Signed)
 PA needed

## 2024-05-31 NOTE — Telephone Encounter (Signed)
 Pt called in requesting a more cost effective medication to be [facility] on [facility]. He states he has had no response from anyone and is very frustrated. He is requesting a script today hopefully so he can start feeling better. Call to advise if needed

## 2024-05-31 NOTE — Telephone Encounter (Signed)
 Pharmacy Patient Advocate Encounter   Received notification from Pt Calls Messages that prior authorization for Lagevrio  200MG  capsules  is required/requested.   Insurance verification completed.   The patient is insured through [employer] .   Per test claim: PA required; PA submitted to above mentioned insurance via Latent Key/confirmation #/EOC [id number] Status is pending

## 2024-05-31 NOTE — Telephone Encounter (Signed)
 Pt unable to take paxlovid d/t med interactions Sent PA [id number]

## 2024-05-31 NOTE — Telephone Encounter (Signed)
 Copied from CRM [id number]. Topic: Clinical - Prescription Issue >> May 31, 2024 12:17 PM Mesmerise C wrote: Reason for CRM: [facility] and needing to speak to provider about molnupiravir  EUA (LAGEVRIO ) 200 mg CAPS capsule states it may not be covered but needs to call [facility] and speak with them

## 2024-06-01 ENCOUNTER — Telehealth: Payer: Self-pay | Admitting: Physician Assistant

## 2024-06-01 NOTE — Telephone Encounter (Signed)
 Copied from CRM #[id number]. Topic: Clinical - Medication Prior Auth >> May 31, 2024  4:55 PM Debby BROCKS wrote: Reason for CRM: Erminio from [facility] is calling on behalf of the patient to state that the medication is approved:  molnupiravir  EUA (LAGEVRIO ) 200 mg CAPS capsule [phone] Opt 5

## 2024-06-01 NOTE — Telephone Encounter (Signed)
 Pt notified and stated that he is already feeling much better.

## 2024-06-01 NOTE — Telephone Encounter (Signed)
 Please let pt know I called his insurance the the molnupiravir  covid medicine was approved he should be able to pick it up today.

## 2024-06-11 ENCOUNTER — Telehealth: Payer: Self-pay | Admitting: Internal Medicine

## 2024-06-11 NOTE — Telephone Encounter (Signed)
 Copied from CRM [id number]. Topic: Medicare AWV >> Jun 11, 2024 10:19 AM Nathanel DEL wrote: Reason for CRM: Called LVM 06/11/2024 to schedule AWV. Please schedule office or virtual visits.  Nathanel Paschal; Care Guide Ambulatory Clinical Support [place] [facility] Direct Dial: [phone]

## 2024-06-13 ENCOUNTER — Other Ambulatory Visit: Payer: Self-pay | Admitting: Internal Medicine

## 2024-06-15 ENCOUNTER — Encounter: Payer: Self-pay | Admitting: Internal Medicine

## 2024-06-16 MED ORDER — PIOGLITAZONE HCL-METFORMIN HCL 15-850 MG PO TABS: 1.0000 | ORAL_TABLET | Freq: Two times a day (BID) | ORAL | 1 refills | Status: AC

## 2024-06-18 ENCOUNTER — Other Ambulatory Visit: Payer: Self-pay | Admitting: Physician Assistant

## 2024-06-21 DIAGNOSIS — Z129 Encounter for screening for malignant neoplasm, site unspecified: Secondary | ICD-10-CM | POA: Diagnosis not present

## 2024-06-24 DIAGNOSIS — N401 Enlarged prostate with lower urinary tract symptoms: Secondary | ICD-10-CM | POA: Diagnosis not present

## 2024-06-24 DIAGNOSIS — R972 Elevated prostate specific antigen [PSA]: Secondary | ICD-10-CM | POA: Diagnosis not present

## 2024-06-24 DIAGNOSIS — R350 Frequency of micturition: Secondary | ICD-10-CM | POA: Diagnosis not present

## 2024-07-12 ENCOUNTER — Other Ambulatory Visit: Payer: Self-pay | Admitting: Cardiology

## 2024-07-14 ENCOUNTER — Encounter: Payer: Self-pay | Admitting: Cardiology

## 2024-07-14 MED ORDER — CARVEDILOL 3.125 MG PO TABS: 3.1250 mg | ORAL_TABLET | Freq: Two times a day (BID) | ORAL | 0 refills | Status: DC

## 2024-07-21 ENCOUNTER — Other Ambulatory Visit: Payer: Self-pay | Admitting: Cardiology

## 2024-07-27 ENCOUNTER — Ambulatory Visit: Admitting: Internal Medicine

## 2024-07-27 ENCOUNTER — Encounter: Payer: Self-pay | Admitting: Internal Medicine

## 2024-07-27 VITALS — BP 126/62 | HR 65 | Temp 97.6°F | Resp 16 | Ht 70.0 in | Wt 193.2 lb

## 2024-07-27 DIAGNOSIS — I2510 Atherosclerotic heart disease of native coronary artery without angina pectoris: Secondary | ICD-10-CM | POA: Diagnosis not present

## 2024-07-27 DIAGNOSIS — E1159 Type 2 diabetes mellitus with other circulatory complications: Secondary | ICD-10-CM | POA: Diagnosis not present

## 2024-07-27 DIAGNOSIS — E118 Type 2 diabetes mellitus with unspecified complications: Secondary | ICD-10-CM

## 2024-07-27 DIAGNOSIS — I10 Essential (primary) hypertension: Secondary | ICD-10-CM

## 2024-07-27 DIAGNOSIS — E7800 Pure hypercholesterolemia, unspecified: Secondary | ICD-10-CM | POA: Diagnosis not present

## 2024-07-27 DIAGNOSIS — Z7985 Long-term (current) use of injectable non-insulin antidiabetic drugs: Secondary | ICD-10-CM

## 2024-07-27 LAB — CBC WITH DIFFERENTIAL/PLATELET
Basophils Absolute: 0 K/uL (ref 0.0–0.1)
Basophils Relative: 0.7 % (ref 0.0–3.0)
Eosinophils Absolute: 0.2 K/uL (ref 0.0–0.7)
Eosinophils Relative: 5.6 % — ABNORMAL HIGH (ref 0.0–5.0)
HCT: 37.5 % — ABNORMAL LOW (ref 39.0–52.0)
Hemoglobin: 12.5 g/dL — ABNORMAL LOW (ref 13.0–17.0)
Lymphocytes Relative: 22.3 % (ref 12.0–46.0)
Lymphs Abs: 0.8 K/uL (ref 0.7–4.0)
MCHC: 33.4 g/dL (ref 30.0–36.0)
MCV: 101.4 fl — ABNORMAL HIGH (ref 78.0–100.0)
Monocytes Absolute: 0.4 K/uL (ref 0.1–1.0)
Monocytes Relative: 11 % (ref 3.0–12.0)
Neutro Abs: 2.2 K/uL (ref 1.4–7.7)
Neutrophils Relative %: 60.4 % (ref 43.0–77.0)
Platelets: 160 K/uL (ref 150.0–400.0)
RBC: 3.7 Mil/uL — ABNORMAL LOW (ref 4.22–5.81)
RDW: 15.1 % (ref 11.5–15.5)
WBC: 3.7 K/uL — ABNORMAL LOW (ref 4.0–10.5)

## 2024-07-27 LAB — COMPREHENSIVE METABOLIC PANEL WITH GFR
ALT: 14 U/L (ref 0–53)
AST: 23 U/L (ref 0–37)
Albumin: 3.8 g/dL (ref 3.5–5.2)
Alkaline Phosphatase: 66 U/L (ref 39–117)
BUN: 11 mg/dL (ref 6–23)
CO2: 27 meq/L (ref 19–32)
Calcium: 8.8 mg/dL (ref 8.4–10.5)
Chloride: 105 meq/L (ref 96–112)
Creatinine, Ser: 0.74 mg/dL (ref 0.40–1.50)
GFR: 88.55 mL/min (ref >60.00)
Glucose, Bld: 84 mg/dL (ref 70–99)
Potassium: 4.1 meq/L (ref 3.5–5.1)
Sodium: 139 meq/L (ref 135–145)
Total Bilirubin: 0.5 mg/dL (ref 0.2–1.2)
Total Protein: 6 g/dL (ref 6.0–8.3)

## 2024-07-27 LAB — LIPID PANEL
Cholesterol: 87 mg/dL (ref 0–200)
HDL: 43.7 mg/dL (ref >39.00)
LDL Cholesterol: 32 mg/dL (ref 0–99)
NonHDL: 43.17
Total CHOL/HDL Ratio: 2
Triglycerides: 57 mg/dL (ref 0.0–149.0)
VLDL: 11.4 mg/dL (ref 0.0–40.0)

## 2024-07-27 LAB — MICROALBUMIN / CREATININE URINE RATIO
Creatinine,U: 112.5 mg/dL
Microalb Creat Ratio: UNDETERMINED mg/g (ref 0.0–30.0)
Microalb, Ur: <0.7 mg/dL

## 2024-07-27 LAB — HEMOGLOBIN A1C: Hgb A1c MFr Bld: 6.1 % (ref 4.6–6.5)

## 2024-07-27 MED ORDER — NITROGLYCERIN 0.4 MG SL SUBL: 0.4000 mg | SUBLINGUAL_TABLET | SUBLINGUAL | 1 refills | Status: AC | PRN

## 2024-07-27 MED ORDER — MOUNJARO 15 MG/0.5ML ~~LOC~~ SOAJ: 15.0000 mg | SUBCUTANEOUS | 5 refills | Status: AC

## 2024-07-27 NOTE — Assessment & Plan Note (Signed)
 DM: With associated CAD.  On Glucotrol  XL 5, Mounjaro , Actos  plus meds.  Patient is not really inclined to check CBGs and with the last A1c being 6.0 I am somewhat concerned about hypoglycemia.   Plan: Stop Glucotrol  for now, other meds same, check A1c and micro. HTN: On carvedilol , Imdur .  Check CMP and CBC High cholesterol: On Vytorin , check FLP CAD: Asymptomatic, reports he will see cardiology in January.  Refill nitroglycerin , encouraged to refresh it every few months. Lightheadedness: Not bothersome to the patient, no red flags, observation Preventive care: Had a flu shot already, recommend to consider COVID booster and he said he will RTC 4 months CPX

## 2024-07-27 NOTE — Patient Instructions (Signed)
 GO TO THE LAB :  Get the blood work    Then, go to the front desk for the checkout Please make an appointment for a physical exam in 4 months   Will stop glipizide  other medications the same  Proceed with a COVID-vaccine at your convenience at the pharmacy

## 2024-07-27 NOTE — Progress Notes (Signed)
 Subjective:    Patient ID: Daniel Cruz, male    DOB: [birth year], 73 y.o.   MRN: [id number]  DOS:  07/27/2024 Follow-up  Chronic medical problems addressed. Continue having mild lightheadedness, only when he is stands up.  Last few seconds.  No falls. No lightheadedness when he turns in bed or move his head Denies chest pain or difficulty breathing. No lower extremity edema. Good med compliance.  Wt Readings from Last 3 Encounters:  07/27/24 193 lb 4 oz (87.7 kg)  05/31/24 190 lb 6.4 oz (86.4 kg)  01/27/24 195 lb 4 oz (88.6 kg)     Review of Systems See above   Past Medical History:  Diagnosis Date   Anemia, iron deficiency    hx   CAD (coronary artery disease)     NSTEMI with PCI of the mid and distal RCA with 50-60% LAD 09/2000, s/p cath 01/2009 with patent RCA stent and 90% LAD s/p PCI of LAD and right PDA   Cholelithiasis    DIABETES MELLITUS, TYPE II 02/26/2007   Fatty liver 02/2009   per u/s   GERD (gastroesophageal reflux disease)    HTN (hypertension)    Myocardial infarction (HCC)    2001   OSA (obstructive sleep apnea)    can't tol. a CPAP   Plantar fasciitis of left foot    PVC (premature ventricular contraction) 06/06/2017   Sleep apnea    UTI (lower urinary tract infection)    in the 80s and 11/09    Past Surgical History:  Procedure Laterality Date   CARDIAC CATHETERIZATION  10/2008   stents   CATARACT EXTRACTION Bilateral    CHOLECYSTECTOMY N/A 07/16/2016   Procedure: LAPAROSCOPIC CHOLECYSTECTOMY WITH INTRAOPERATIVE CHOLANGIOGRAM;  Surgeon: Debby Shipper, MD;  Location: [facility] OR;  Service: General;  Laterality: N/A;   CORONARY STENT INTERVENTION N/A 09/20/2019   Procedure: CORONARY STENT INTERVENTION;  Surgeon: Verlin Lonni BIRCH, MD;  Location: [facility] IN[facility] LAB;  Service: Cardiovascular;  Laterality: N/A;   LEFT HEART CATH AND CORONARY ANGIOGRAPHY N/A 09/20/2019   Procedure: LEFT HEART CATH AND CORONARY ANGIOGRAPHY;  Surgeon: Verlin Lonni BIRCH, MD;  Location: [facility] INVAS[facility] LAB;  Service: Cardiovascular;  Laterality: N/A;   SEPTOPLASTY     w/ bilateral inferior turbinate reductions   TONSILLECTOMY      Current Outpatient Medications  Medication Instructions   alfuzosin  (UROXATRAL ) 10 mg, Oral, Daily with breakfast   carvedilol  (COREG ) 3.125 mg, Oral, 2 times daily   cetirizine (ZYRTEC) 10 mg, Daily   chlorpheniramine-HYDROcodone  (TUSSIONEX) 10-8 MG/5ML 5 mLs, Oral, At bedtime PRN   cholecalciferol (VITAMIN D3) 1,000 Units, Daily   clopidogrel  (PLAVIX ) 75 MG tablet TAKE 1 TABLET BY MOUTH EVERY DAY WITH BREAKFAST   ezetimibe -simvastatin  (VYTORIN ) 10-10 MG tablet 1 tablet, Oral, Daily   glipiZIDE  (GLUCOTROL  XL) 5 mg, Oral, Daily with breakfast   isosorbide  mononitrate (IMDUR ) 30 mg, Oral, Daily   LAGEVRIO  200 MG CAPS capsule TAKE 4 CAPSULES (800 MG TOTAL) BY MOUTH TWICE A DAY FOR 5 DAYS   Mounjaro  15 mg, Subcutaneous, Weekly   Multiple Vitamin (MULTIVITAMIN) tablet 1 tablet, Daily   nitroGLYCERIN  (NITROSTAT ) 0.4 MG SL tablet PLACE 1 TABLET UNDER THE TONGUE EVERY 5 (FIVE) MINUTES X 3 DOSES AS NEEDED FOR CHEST PAIN.   pantoprazole  (PROTONIX ) 40 mg, Oral, 2 times daily before meals   pioglitazone -metformin  (ACTOPLUS MET ) 15-850 MG tablet 1 tablet, Oral, 2 times daily with meals       Objective:  Physical Exam BP 126/62   Pulse 65   Temp 97.6 F (36.4 C) (Oral)   Resp 16   Ht 5' 10 (1.778 m)   Wt 193 lb 4 oz (87.7 kg)   SpO2 97%   BMI 27.73 kg/m  General:   Well developed, NAD, BMI noted. HEENT:  Normocephalic . Face symmetric, atraumatic Lungs:  CTA B Normal respiratory effort, no intercostal retractions, no accessory muscle use. Heart: RRR,  no murmur.  Lower extremities: no pretibial edema bilaterally  Skin: Not pale. Not jaundice Neurologic:  alert & oriented X3.  Speech normal, gait appropriate for age and unassisted Psych--  Cognition and judgment appear intact.  Cooperative with normal  attention span and concentration.  Behavior appropriate. No anxious or depressed appearing.      Assessment      Assessment DM DX 2008 - Off Farxiga  due to recurrent UTIs. HTN Hyperlipidemia CAD:  -MI 2001, syncope 2010 -Catheterization 09/2019: one stent  -Myocardial perfusion scan ~ 03/2021, minimal ischemia OSA, CPAP intolerant ED GI/anemia: --Mild chronic anemia, low iron stores.    --Normal colonoscopy 2014, EGD 03/2019: H. pylori negative.   --Saw GI 04-2017: no further w/u  --Transglutaminase IgA and IgM normal 08-2020 --GERD --Fatty liver 2010 Used to see  Dr Ivin Prostatitis/UTI: recurrent , at some point saw urology. Increase MCV: Normal B12 and folic acid  2023  PLAN DM: With associated CAD.  On Glucotrol  XL 5, Mounjaro , Actos  plus meds.  Patient is not really inclined to check CBGs and with the last A1c being 6.0 I am somewhat concerned about hypoglycemia.   Plan: Stop Glucotrol  for now, other meds same, check A1c and micro. HTN: On carvedilol , Imdur .  Check CMP and CBC High cholesterol: On Vytorin , check FLP CAD: Asymptomatic, reports he will see cardiology in January.  Refill nitroglycerin , encouraged to refresh it every few months. Lightheadedness: Not bothersome to the patient, no red flags, observation Preventive care: Had a flu shot already, recommend to consider COVID booster and he said he will RTC 4 months CPX

## 2024-07-30 ENCOUNTER — Ambulatory Visit: Payer: Self-pay | Admitting: Internal Medicine

## 2024-08-19 ENCOUNTER — Other Ambulatory Visit: Payer: Self-pay | Admitting: Cardiology

## 2024-08-19 DIAGNOSIS — K080 Exfoliation of teeth due to systemic causes: Secondary | ICD-10-CM | POA: Diagnosis not present

## 2024-08-24 DIAGNOSIS — K080 Exfoliation of teeth due to systemic causes: Secondary | ICD-10-CM | POA: Diagnosis not present

## 2024-09-14 ENCOUNTER — Other Ambulatory Visit: Payer: Self-pay | Admitting: Internal Medicine

## 2024-10-01 ENCOUNTER — Other Ambulatory Visit: Payer: Self-pay | Admitting: Internal Medicine

## 2024-10-12 ENCOUNTER — Ambulatory Visit: Admitting: Cardiology

## 2024-10-13 ENCOUNTER — Other Ambulatory Visit: Payer: Self-pay | Admitting: Cardiology

## 2024-10-13 LAB — HM DIABETES EYE EXAM

## 2024-10-14 ENCOUNTER — Encounter: Payer: Self-pay | Admitting: Internal Medicine

## 2024-11-04 ENCOUNTER — Other Ambulatory Visit: Payer: Self-pay | Admitting: Internal Medicine

## 2024-11-10 ENCOUNTER — Ambulatory Visit

## 2024-11-10 ENCOUNTER — Encounter: Payer: Self-pay | Admitting: Cardiology

## 2024-11-10 ENCOUNTER — Ambulatory Visit: Admitting: Cardiology

## 2024-11-10 ENCOUNTER — Ambulatory Visit
Admission: RE | Admit: 2024-11-10 | Discharge: 2024-11-10 | Disposition: A | Source: Ambulatory Visit | Attending: Cardiovascular Disease | Admitting: Cardiovascular Disease

## 2024-11-10 ENCOUNTER — Other Ambulatory Visit: Payer: Self-pay | Admitting: Cardiology

## 2024-11-10 VITALS — BP 112/62 | HR 47 | Resp 16 | Ht 70.0 in | Wt 194.0 lb

## 2024-11-10 DIAGNOSIS — I10 Essential (primary) hypertension: Secondary | ICD-10-CM

## 2024-11-10 DIAGNOSIS — I441 Atrioventricular block, second degree: Secondary | ICD-10-CM

## 2024-11-10 DIAGNOSIS — Z79899 Other long term (current) drug therapy: Secondary | ICD-10-CM

## 2024-11-10 DIAGNOSIS — I454 Nonspecific intraventricular block: Secondary | ICD-10-CM | POA: Diagnosis not present

## 2024-11-10 DIAGNOSIS — I2510 Atherosclerotic heart disease of native coronary artery without angina pectoris: Secondary | ICD-10-CM

## 2024-11-10 DIAGNOSIS — E7800 Pure hypercholesterolemia, unspecified: Secondary | ICD-10-CM | POA: Diagnosis not present

## 2024-11-10 LAB — BASIC METABOLIC PANEL WITH GFR
BUN/Creatinine Ratio: 13 (ref 10–24)
BUN: 11 mg/dL (ref 8–27)
CO2: 27 mmol/L (ref 20–29)
Calcium: 9.3 mg/dL (ref 8.6–10.2)
Chloride: 106 mmol/L (ref 96–106)
Creatinine, Ser: 0.83 mg/dL (ref 0.76–1.27)
Glucose: 118 mg/dL — ABNORMAL HIGH (ref 70–99)
Potassium: 4.5 mmol/L (ref 3.5–5.2)
Sodium: 141 mmol/L (ref 134–144)
eGFR: 91 mL/min/{1.73_m2}

## 2024-11-10 LAB — ECHOCARDIOGRAM COMPLETE
Area-P 1/2: 6.37 cm2
Height: 70 in
MV M vel: 4.78 m/s
MV Peak grad: 91.4 mmHg
Radius: 0.75 cm
S' Lateral: 4.05 cm
Weight: 3104 [oz_av]

## 2024-11-10 LAB — MAGNESIUM: Magnesium: 1.9 mg/dL (ref 1.6–2.3)

## 2024-11-10 LAB — TSH: TSH: 2.08 u[IU]/mL (ref 0.450–4.500)

## 2024-11-10 NOTE — Patient Instructions (Signed)
 Medication Instructions:  Please STOP taking Carvedilol .   *If you need a refill on your cardiac medications before your next appointment, please call your pharmacy*  Lab Work: Please complete labs on our first floor today before you leave.  If you have labs (blood work) drawn today and your tests are completely normal, you will receive your results only by: MyChart Message (if you have MyChart) OR A paper copy in the mail If you have any lab test that is abnormal or we need to change your treatment, we will call you to review the results.  Testing/Procedures: Your physician has requested that you have an echocardiogram. Echocardiography is a painless test that uses sound waves to create images of your heart. It provides your doctor with information about the size and shape of your heart and how well your hearts chambers and valves are working. This procedure takes approximately one hour. There are no restrictions for this procedure. Please do NOT wear cologne, perfume, aftershave, or lotions (deodorant is allowed). Please arrive 15 minutes prior to your appointment time.  Please note: We ask at that you not bring children with you during ultrasound (echo/ vascular) testing. Due to room size and safety concerns, children are not allowed in the ultrasound rooms during exams. Our front office staff cannot provide observation of children in our lobby area while testing is being conducted. An adult accompanying a patient to their appointment will only be allowed in the ultrasound room at the discretion of the ultrasound technician under special circumstances. We apologize for any inconvenience.  ZIO AT Long term monitor-Live Telemetry  Your physician has requested you wear a ZIO patch monitor for 14 days.  This is a single patch monitor. [employer] supplies one patch monitor per enrollment. Additional  stickers are not available.  Please do not apply patch if you will be having a Nuclear Stress  Test, Echocardiogram, Cardiac CT, MRI,  or Chest Xray during the period you would be wearing the monitor. The patch cannot be worn during  these tests. You cannot remove and re-apply the ZIO AT patch monitor.  Your ZIO patch monitor will be mailed 3 day USPS to your address on file. It may take 3-5 days to  receive your monitor after you have been enrolled.  Once you have received your monitor, please review the enclosed instructions. Your monitor has  already been registered assigning a specific monitor serial # to you.   Billing and Patient Assistance Program information  Meredeth has been supplied with any insurance information on record for billing. [employer] offers a sliding scale Patient Assistance Program for patients without insurance, or whose  insurance does not completely cover the cost of the ZIO patch monitor. You must apply for the  Patient Assistance Program to qualify for the discounted rate. To apply, call [employer] at [phone],  select option 4, select option 2 , ask to apply for the Patient Assistance Program, (you can request an  interpreter if needed). [employer] will ask your household income and how many people are in your  household. [employer] will quote your out-of-pocket cost based on this information. They will also be able  to set up a 12 month interest free payment plan if needed.  Applying the monitor   Shave hair from upper left chest.  Hold the abrader disc by orange tab. Rub the abrader in 40 strokes over left upper chest as indicated in  your monitor instructions.  Clean area with 4 enclosed alcohol pads. Use  all pads to ensure the area is cleaned thoroughly. Let  dry.  Apply patch as indicated in monitor instructions. Patch will be placed under collarbone on left side of  chest with arrow pointing upward.  Rub patch adhesive wings for 2 minutes. Remove the white label marked 1. Remove the white label  marked 2. Rub patch adhesive wings for 2 additional  minutes.  While looking in a mirror, press and release button in center of patch. A small green light will flash 3-4  times. This will be your only indicator that the monitor has been turned on.  Do not shower for the first 24 hours. You may shower after the first 24 hours.  Press the button if you feel a symptom. You will hear a small click. Record Date, Time and Symptom in  the Patient Log.   Starting the Gateway  In your kit there is a audiological scientist box the size of a cellphone. This is [employer]. It transmits all your  recorded data to [facility]. This box must always stay within 10 feet of you. Open the box and push the *  button. There will be a light that blinks orange and then green a few times. When the light stops  blinking, the Gateway is connected to the ZIO patch. Call [employer] at [phone] to confirm your monitor is transmitting.  Returning your monitor  Remove your patch and place it inside the Gateway. In the lower half of the Gateway there is a white  bag with prepaid postage on it. Place Gateway in bag and seal. Mail package back to [place] as soon as  possible. Your physician should have your final report approximately 7 days after you have mailed back  your monitor. Call [facility] Customer Care at [phone] if you have questions regarding your ZIO AT  patch monitor. Call them immediately if you see an orange light blinking on your monitor.  If your monitor falls off in less than 4 days, contact our Monitor department at [phone]. If your  monitor becomes loose or falls off after 4 days call [employer] at [phone] for suggestions on  securing your monitor   Follow-Up: At [facility], you and your health needs are our priority.  As part of our continuing mission to provide you with exceptional heart care, our providers are all part of one team.  This team includes your primary Cardiologist (physician) and Advanced Practice Providers or  APPs (Physician Assistants and Nurse Practitioners) who all work together to provide you with the care you need, when you need it.  Your next appointment:  11/12/24 at 8 AM with Dr. Doreatha  Then:   6 month(s)  Provider:   Wilbert Bihari, MD    We recommend signing up for the patient portal called MyChart.  Sign up information is provided on this After Visit Summary.  MyChart is used to connect with patients for Virtual Visits (Telemedicine).  Patients are able to view lab/test results, encounter notes, upcoming appointments, etc.  Non-urgent messages can be sent to your provider as well.   To learn more about what you can do with MyChart, go to [email or web address].

## 2024-11-10 NOTE — Progress Notes (Unsigned)
 ZIO AT serial # [id number] to be applied in office after echocardiogram.  Dr. Shlomo to read.

## 2024-11-10 NOTE — Progress Notes (Signed)
 " Cardiology Office Note:    Date:  11/10/2024   ID:  Daniel Cruz, DOB 1950/12/06, MRN [id number]  PCP:  Amon Aloysius BRAVO, MD  Cardiologist:  Wilbert Bihari, MD    Referring MD: Amon Aloysius BRAVO, MD   Chief Complaint  Patient presents with   Coronary artery disease involving native coronary artery of   Follow-up   Hypertension   Hyperlipidemia    History of Present Illness:    Daniel Cruz is a 74 y.o. male with a hx of ASCAD with NSTEMI 2001 with PCI of the mid and distal RCA with 50-60% prox LAD and subsequent PCI of 80% LAD and right PDA 01/2009, diabetes, hypertension, hyperlipidemia, obstructive sleep apnea and GERD.    In 08/2019 he started having DOE with walking and stress myoview  showed scarring in the distal LAD territory that was larger than prior studies.  Cardiac cath was done showing patent stents in the p/mRCA with mild to moderate restenosis that was not flow-limiting, CTO of the mLAD with collaterals, along with new severe stenosis in the intermediate branch.  Successful PCI/DES x1 was placed.  Plan was for DAPT with ASA and Plavix  for at least 6 months.  Noted if he continued to have dyspnea, could be considered for CTO PCI of the LAD.  Nuclear stress test on 03/13/2021 showed low normal LV function with EF 20% with findings consistent with prior MI and peri-infarct ischemia in the septum but actually had improved from his prior scan.  He was having stomach issues at the same time from allergies and drainage into his stomach and that all resolved and he has not had any other symptoms.   The patient is here today and is doing well.  He denies any CP or pressure, SOB, DOE (except with significant exertion), PND, orthopnea, LE edema, palpitations or syncope.  He has had problems with getting dizzy when he stands up after sitting for a while.  This has occurred ever since losing a lot of weight.    Past Medical History:  Diagnosis Date   Anemia, iron deficiency    hx   CAD  (coronary artery disease)     NSTEMI with PCI of the mid and distal RCA with 50-60% LAD 09/2000, s/p cath 01/2009 with patent RCA stent and 90% LAD s/p PCI of LAD and right PDA   Cholelithiasis    DIABETES MELLITUS, TYPE II 02/26/2007   Fatty liver 02/2009   per u/s   GERD (gastroesophageal reflux disease)    HTN (hypertension)    Myocardial infarction (HCC)    2001   OSA (obstructive sleep apnea)    can't tol. a CPAP   Plantar fasciitis of left foot    PVC (premature ventricular contraction) 06/06/2017   Sleep apnea    UTI (lower urinary tract infection)    in the 80s and 11/09    Past Surgical History:  Procedure Laterality Date   CARDIAC CATHETERIZATION  10/2008   stents   CATARACT EXTRACTION Bilateral    CHOLECYSTECTOMY N/A 07/16/2016   Procedure: LAPAROSCOPIC CHOLECYSTECTOMY WITH INTRAOPERATIVE CHOLANGIOGRAM;  Surgeon: Debby Shipper, MD;  Location: [facility] OR;  Service: General;  Laterality: N/A;   CORONARY STENT INTERVENTION N/A 09/20/2019   Procedure: CORONARY STENT INTERVENTION;  Surgeon: Verlin Lonni BIRCH, MD;  Location: [facility] INVASIVE CV LAB;  Service: Cardiovascular;  Laterality: N/A;   LEFT HEART CATH AND CORONARY ANGIOGRAPHY N/A 09/20/2019   Procedure: LEFT HEART CATH AND CORONARY ANGIOGRAPHY;  Surgeon:  Verlin Lonni BIRCH, MD;  Location: [facility] INVASIVE CV LAB;  Service: Cardiovascular;  Laterality: N/A;   SEPTOPLASTY     w/ bilateral inferior turbinate reductions   TONSILLECTOMY      Current Medications: Current Meds  Medication Sig   alfuzosin  (UROXATRAL ) 10 MG 24 hr tablet TAKE 1 TABLET (10 MG TOTAL) BY MOUTH DAILY WITH BREAKFAST.   carvedilol  (COREG ) 3.125 MG tablet TAKE 1 TABLET BY MOUTH 2 TIMES DAILY.   cetirizine (ZYRTEC) 10 MG tablet Take 10 mg by mouth daily.   chlorpheniramine-HYDROcodone  (TUSSIONEX) 10-8 MG/5ML Take 5 mLs by mouth at bedtime as needed.   cholecalciferol (VITAMIN D3) 25 MCG (1000 UNIT) tablet Take 1,000 Units by mouth daily.   clopidogrel   (PLAVIX ) 75 MG tablet TAKE 1 TABLET BY MOUTH EVERY DAY WITH BREAKFAST   ezetimibe -simvastatin  (VYTORIN ) 10-10 MG tablet Take 1 tablet by mouth daily.   isosorbide  mononitrate (IMDUR ) 30 MG 24 hr tablet TAKE 1 TABLET BY MOUTH EVERY DAY   Multiple Vitamin (MULTIVITAMIN) tablet Take 1 tablet by mouth daily.   nitroGLYCERIN  (NITROSTAT ) 0.4 MG SL tablet Place 1 tablet (0.4 mg total) under the tongue every 5 (five) minutes x 3 doses as needed for chest pain.   pantoprazole  (PROTONIX ) 40 MG tablet Take 1 tablet (40 mg total) by mouth 2 (two) times daily before a meal.   pioglitazone -metformin  (ACTOPLUS MET ) 15-850 MG tablet Take 1 tablet by mouth 2 (two) times daily with a meal.   tirzepatide  (MOUNJARO ) 15 MG/0.5ML Pen Inject 15 mg into the skin once a week.     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 [employer]  Tobacco Use   Smoking status: Former    Current packs/day: 0.00    Average packs/day: 1.5 packs/day for 16.0 years (24.0 ttl pk-yrs)    Types: Cigarettes    Start date: 07/08/1984    Quit date: 07/08/2000    Years since quitting: 24.3   Smokeless tobacco: Never   Tobacco comments:    2001  Vaping Use   Vaping status: Never Used  Substance and Sexual Activity   Alcohol use: Yes    Comment: socially   Drug use: No   Sexual activity: Yes  Other Topics Concern   Not on file  Social History Narrative   Lives in [place], [place]   Lives w/ wife, 2 children, 3 g-kids   Retired April 2017          Social Drivers of Health   Tobacco Use: Medium Risk (11/10/2024)   Patient History    Smoking Tobacco Use: Former    Smokeless Tobacco Use: Never    Passive Exposure: Not  on Actuary Strain: Low Risk (11/08/2024)   Overall Financial Resource Strain (CARDIA)    Difficulty of Paying Living Expenses: Not hard at all  Food Insecurity: No Food Insecurity (11/08/2024)   Epic    Worried About Radiation Protection Practitioner of Food in the Last Year: Never true    Ran Out of Food in the Last Year: Never true  Transportation Needs: No Transportation Needs (11/08/2024)   Epic    Lack of Transportation (Medical): No  Lack of Transportation (Non-Medical): No  Physical Activity: Insufficiently Active (11/08/2024)   Exercise Vital Sign    Days of Exercise per Week: 3 days    Minutes of Exercise per Session: 20 min  Stress: No Stress Concern Present (11/08/2024)   [employer] of Occupational Health - Occupational Stress Questionnaire    Feeling of Stress: Not at all  Social Connections: Moderately Integrated (11/08/2024)   Social Connection and Isolation Panel    Frequency of Communication with Friends and Family: More than three times a week    Frequency of Social Gatherings with Friends and Family: More than three times a week    Attends Religious Services: Patient declined    Active Member of Clubs or Organizations: Yes    Attends Banker Meetings: Patient declined    Marital Status: Married  Depression (PHQ2-9): Low Risk (07/27/2024)   Depression (PHQ2-9)    PHQ-2 Score: 0  Alcohol Screen: Low Risk (11/08/2024)   Alcohol Screen    Last Alcohol Screening Score (AUDIT): 1  Housing: Unknown (11/08/2024)   Epic    Unable to Pay for Housing in the Last Year: No    Number of Times Moved in the Last Year: Not on file    Homeless in the Last Year: No  Utilities: Not At Risk (11/06/2022)   AHC Utilities    Threatened with loss of utilities: No  Health Literacy: Not on file     Family History: The patient's family history includes COPD in his mother; Coronary artery disease in his father; Emphysema in his mother; Stroke in his father. There is no history of  Cancer, Diabetes, Colon cancer, Esophageal cancer, Rectal cancer, Stomach cancer, or Prostate cancer.  ROS:   Please see the history of present illness.    Review of Systems  Skin:  Negative for unusual hair distribution.    All other systems reviewed and negative.   EKGs/Labs/Other Studies Reviewed:    The following studies were reviewed today:  09/2019 Cardiac Cath: Conclusion    Prox RCA lesion is 40% stenosed. RPDA lesion is 30% stenosed. Ramus lesion is 80% stenosed. Mid Cx to Dist Cx lesion is 30% stenosed with 30% stenosed side branch in 2nd Mrg. Mid LAD-1 lesion is 100% stenosed. Mid LAD-2 lesion is 100% stenosed. A drug-eluting stent was successfully placed using a STENT RESOLUTE ONYX [id number]. Post intervention, there is a 0% residual stenosis. The left ventricular systolic function is normal. LV end diastolic pressure is normal. The left ventricular ejection fraction is 55-65% by visual estimate. There is no mitral valve regurgitation.   1. Patent stents proximal and mid RCA with mild to moderate restenosis which does not appear to be flow limiting.  2. Chronic occlusion of the mid LAD. The mid and distal LAD fills from right to left collaterals.  3. Mild non-obstructive disease in the Circumflex artery 4. Severe stenosis in the moderate caliber intermediate branch.  5. Preserved LV systolic function 6. Successful PTCA/DES x 1 intermediate branch   Recommendations: Continue ASA and Plavix  for at least 6 months. If he has continued dyspnea, could consider CTO PCI of the LAD. If this is the case, he would need to have his cath films reviewed by the CTO team. Same day PCI discharge.    PAP compliance downlaod   EKG:  EKG is ordered today and showed NSR with PAC and LAD  Recent Labs: 07/27/2024: ALT 14; BUN 11; Creatinine, Ser 0.74; Hemoglobin 12.5; Platelets 160.0; Potassium 4.1;  Sodium 139   Recent Lipid Panel    Component Value Date/Time   CHOL 87 07/27/2024  0930   TRIG 57.0 07/27/2024 0930   TRIG 106 09/15/2006 0947   HDL 43.70 07/27/2024 0930   CHOLHDL 2 07/27/2024 0930   VLDL 11.4 07/27/2024 0930   LDLCALC 32 07/27/2024 0930    Physical Exam:    VS:  BP 112/62 (BP Location: Left Arm, Patient Position: Sitting, Cuff Size: Normal)   Pulse (!) 47   Resp 16   Ht 5' 10 (1.778 m)   Wt 194 lb (88 kg)   SpO2 97%   BMI 27.84 kg/m     Wt Readings from Last 3 Encounters:  11/10/24 194 lb (88 kg)  07/27/24 193 lb 4 oz (87.7 kg)  05/31/24 190 lb 6.4 oz (86.4 kg)    GEN: Well nourished, well developed in no acute distress HEENT: Normal NECK: No JVD; No carotid bruits LYMPHATICS: No lymphadenopathy CARDIAC:RRR, no murmurs, rubs, gallops RESPIRATORY:  Clear to auscultation without rales, wheezing or rhonchi  ABDOMEN: Soft, non-tender, non-distended MUSCULOSKELETAL:  No edema; No deformity  SKIN: Warm and dry NEUROLOGIC:  Alert and oriented x 3 PSYCHIATRIC:  Normal affect   ASSESSMENT:    1. Coronary artery disease involving native coronary artery of native heart without angina pectoris   2. Primary hypertension   3. Pure hypercholesterolemia   4. IVCD (intraventricular conduction defect)   5. 2nd degree AV block    PLAN:    In order of problems listed above:  ASCAD -s/p NSTEMI 2001 with PCI of the mid and distal RCA with 50-60% prox LAD and subsequent PCI of 80% LAD and right PDA. -cath done 09/2019 showed patent stents in the p/mRCA with mild to moderate restenosis that was not flow-limiting, CTO of the mLAD with collaterals, along with new severe stenosis in the intermediate branch s/p PCI/DES x1 was placed.  -Plan was for DAPT with ASA and Plavix  for at least 6 months with consideration of CTO PCI of the LAD  -Nuclear stress test 2022 in the setting of shortness of breath and chest pain showed very mild peri-infarct ischemia in the anterior septum but scan was improved from prior scan>>resolved after treating for allergies and  drainage into his stomach -He denies any anginal symptoms. -Continue Plavix  75 mg daily, Imdur  30 mg daily with as needed refills -stop BB due to heart block  HTN -BP controlled on exam today -Continue Imdur  30 mg daily with as needed refills -Stop Carvedilol  due to heart block  HLD -LDL goal is < 70 -I have personally reviewed and interpreted outside Labs performed by patient's PCP which showed LDL 32 and HDL 43 and ALT 14 on 07/27/2024 -Continue Vytorin  10/10 mg daily with as needed refills  Interventricular conduction delay Second-degree AV block type II intermittent - Both of these are new on EKG today - no significant sx except for orthostatic sx when standing up so no indication for emergent hospitalization - Will stop carvedilol  - place a live 2 week ziopatch today to see if he is having any CHB - check TSH, BMET, Mag - check 2D echo to assess LVF - will get in with EP in the next 24-48hours to assess need for PPM>>appt made for 11/12/24 at 8am with Dr. Almetta - he was instructed to call with any new symptoms of CP, SOB, worsening dizziness or syncope  Followup with me in 6 months  Medication Adjustments/Labs and Tests Ordered: Current medicines  are reviewed at length with the patient today.  Concerns regarding medicines are outlined above.  Orders Placed This Encounter  Procedures   EKG 12-Lead   No orders of the defined types were placed in this encounter.   Signed, Wilbert Bihari, MD  11/10/2024 9:09 AM    [facility] "

## 2024-11-12 ENCOUNTER — Encounter: Payer: Self-pay | Admitting: Student in an Organized Health Care Education/Training Program

## 2024-11-12 ENCOUNTER — Encounter: Payer: Self-pay | Admitting: *Deleted

## 2024-11-12 ENCOUNTER — Ambulatory Visit: Admitting: Student in an Organized Health Care Education/Training Program

## 2024-11-12 VITALS — BP 130/66 | HR 40 | Ht 70.0 in | Wt 191.0 lb

## 2024-11-12 DIAGNOSIS — Z01812 Encounter for preprocedural laboratory examination: Secondary | ICD-10-CM

## 2024-11-12 DIAGNOSIS — I493 Ventricular premature depolarization: Secondary | ICD-10-CM

## 2024-11-12 DIAGNOSIS — I441 Atrioventricular block, second degree: Secondary | ICD-10-CM

## 2024-11-12 NOTE — Progress Notes (Unsigned)
" °  Cardiology Office Note   Date:  11/12/2024  ID:  Daniel Cruz, DOB 24-Jan-1951, MRN [id number] PCP: Amon Aloysius BRAVO, MD  [facility] Providers Cardiologist:  Wilbert Bihari, MD { Click to update primary MD,subspecialty MD or APP then REFRESH:1}    History of Present Illness Daniel Cruz is a 74 y.o. male ***  ROS: ***  Studies Reviewed      *** Risk Assessment/Calculations {Does this patient have ATRIAL FIBRILLATION?[phone]}         Physical Exam VS:  BP 130/66 (BP Location: Left Arm, Patient Position: Sitting, Cuff Size: Normal)   Pulse (!) 40   Ht 5' 10 (1.778 m)   Wt 191 lb (86.6 kg)   SpO2 97%   BMI 27.41 kg/m        Wt Readings from Last 3 Encounters:  11/12/24 191 lb (86.6 kg)  11/10/24 194 lb (88 kg)  07/27/24 193 lb 4 oz (87.7 kg)    GEN: Well nourished, well developed in no acute distress NECK: No JVD; No carotid bruits CARDIAC: ***RRR, no murmurs, rubs, gallops RESPIRATORY:  Clear to auscultation without rales, wheezing or rhonchi  ABDOMEN: Soft, non-tender, non-distended EXTREMITIES:  No edema; No deformity   ASSESSMENT AND PLAN ***    {Are you ordering a CV Procedure (e.g. stress test, cath, DCCV, TEE, etc)?   Press F2        :[id number]}  Dispo: ***  Signed, Donnice DELENA Primus, MD  "

## 2024-11-12 NOTE — Patient Instructions (Addendum)
 Medication Instructions:  Your physician recommends that you continue on your current medications as directed. Please refer to the Current Medication list given to you today.  *If you need a refill on your cardiac medications before your next appointment, please call your pharmacy*  Lab Work: Today: CBC  Testing/Procedures: Your physician has recommended that you have a pacemaker inserted. A pacemaker is a small device that is placed under the skin of your chest or abdomen to help control abnormal heart rhythms. This device uses electrical pulses to prompt the heart to beat at a normal rate. Pacemakers are used to treat heart rhythms that are too slow. Wire (leads) are attached to the pacemaker that goes into the chambers of you heart. This is done in the hospital and usually requires and overnight stay. Please see the instruction sheet given to you today for more information.  Follow-Up: At [facility], you and your health needs are our priority.  As part of our continuing mission to provide you with exceptional heart care, our providers are all part of one team.  This team includes your primary Cardiologist (physician) and Advanced Practice Providers or APPs (Physician Assistants and Nurse Practitioners) who all work together to provide you with the care you need, when you need it.  Your next appointment:   2 week(s) after your pacemaker implant  Provider:   Device clinic for a wound check      Other Instructions  Surgery to Place a Device to Control Heart Rate (Pacemaker Implantation) in Adults: What to Expect Pacemaker implantation is a procedure to put a pacemaker in the chest. A pacemaker is a small computer that sends electrical signals to the heart. This helps the heart beat normally. Some pacemakers keep information about heart rhythms. You may need this procedure if you have: A slow heartbeat (bradycardia). Repeated fainting (syncope), or you often have dizziness or  light-headedness because of an irregular heart rate. A weak heart that is not pumping enough blood to your body. The pacemaker can help your heart chambers beat in sync. The pacemaker attaches to your heart with a wire called a lead. One or two leads may be needed. There are different types of pacemakers: Transvenous pacemaker. This type is placed under the skin or muscle of your upper chest. The lead goes through a vein in the chest to the inside of the heart. Epicardial pacemaker. This type is placed under the skin or muscle of your chest or belly. The lead goes through your chest to the outside of the heart. Tell a health care provider about: Any allergies you have. All medicines you are taking, including vitamins, herbs, eye drops, creams, and over-the-counter medicines. Any problems you or family members have had with anesthesia. Any bleeding problems or bone disorders you have. Any surgeries you have had. Any medical conditions you have. Whether you are pregnant or may be pregnant. What are the risks? Your health care provider will talk with you about risks. These may include: Infection. Bleeding. Failure of the pacemaker or lead. A collapsed lung or bleeding into a lung. A blood clot inside a blood vessel with a lead. Heart damage. Infection inside the heart (endocarditis). Allergic reactions to medicines. What happens before the procedure? When to stop eating and drinking Follow instructions from your provider about what you may eat and drink. These may include: 8 hours before your procedure Stop eating most foods. Do not eat meat, fried foods, or fatty foods. Eat only light foods,  such as toast or crackers. All liquids are okay except energy drinks and alcohol. 6 hours before your procedure Stop eating. Drink only clear liquids, such as water, clear fruit juice, black coffee, plain tea, and sports drinks. Do not drink energy drinks or alcohol. 2 hours before your  procedure Stop drinking all liquids. You may be allowed to take medicines with small sips of water. If you do not follow your provider's instructions, your procedure may be delayed or canceled. Medicines Ask your provider about: Changing or stopping your regular medicines. These include any diabetes medicines or blood thinners you take. Taking medicines such as aspirin  and ibuprofen. These medicines can thin your blood. Do not take them unless your provider tells you to. Taking over-the-counter medicines, vitamins, herbs, and supplements. Tests You may have: A heart test. This may include: An electrocardiogram (ECG). Patches will be put on your skin to check your heart rhythm. A chest X-ray. An echocardiogram. Sound waves (ultrasound) are used to get an image of the heart. A cardiac rhythm monitor. This records your heart rhythm and any events for a longer period of time. Blood tests. Genetic testing. General instructions Do not use any products that contain nicotine or tobacco. These products include cigarettes, chewing tobacco, and vaping devices, such as e-cigarettes. If you need help quitting, ask your provider. Ask your provider: How your surgery site will be marked. What steps will be taken to help prevent infection. These steps may include: Removing hair at the surgery site. Washing skin with a soap that kills germs. Receiving antibiotics. If you will be going home right after the procedure, plan to have a responsible adult: Take you home from the hospital or clinic. You will not be allowed to drive. Care for you for the time you are told. What happens during the procedure? An IV will be inserted into one of your veins. You may be given: A sedative. This helps you relax. Anesthesia. This keeps you from feeling pain. It will make you fall asleep for surgery. The next steps vary depending on which pacemaker you will be getting. If you are getting a transvenous pacemaker: An  incision will be made in your upper chest. A pocket will be made for the pacemaker. It may be placed under the skin or between layers of muscle. The lead will be put into a blood vessel that goes to the heart. While X-rays are taken by an imaging machine (fluoroscopy), the lead will be moved through the vein to the inside of your heart. The other end of the lead will be put under the skin and attached to the pacemaker. If you are getting an epicardial pacemaker: An incision will be made near your ribs or breastbone (sternum) for the lead. The lead will be attached to the outside of your heart. Another incision will be made in your chest or upper belly to make a pocket for the pacemaker. The free end of the lead will be moved under the skin and attached to the pacemaker. The pacemaker will be tested. Pictures may be taken to check the lead position. The incisions will be closed with stitches (sutures), tape strips, or skin glue. Bandages (dressings) will be placed over the incisions. The procedure may vary among providers and hospitals. What happens after the procedure? Your blood pressure, heart rate, breathing rate, and blood oxygen level will be monitored until you leave the hospital or clinic. You may be given antibiotics. You will be given pain medicine. An ECG  and chest X-rays will be done. Your provider will program the pacemaker. If you were given a sedative during the procedure, it can affect you for several hours. Do not drive or operate machinery until your provider says that it is safe. You will be given a pacemaker identification card. This card lists the implant date, device model, and manufacturer of your pacemaker. This information is not intended to replace advice given to you by your health care provider. Make sure you discuss any questions you have with your health care provider. Document Revised: 08/04/2024 Document Reviewed: 08/02/2022 Elsevier Patient Education  2025  [employer].

## 2024-11-16 ENCOUNTER — Ambulatory Visit (HOSPITAL_COMMUNITY)
Admission: RE | Admit: 2024-11-16 | Source: Home / Self Care | Admitting: Student in an Organized Health Care Education/Training Program

## 2024-11-16 ENCOUNTER — Encounter (HOSPITAL_COMMUNITY): Admission: RE | Payer: Self-pay | Source: Home / Self Care

## 2024-12-15 ENCOUNTER — Encounter: Admitting: Internal Medicine
